# Patient Record
Sex: Male | Born: 1970
Health system: Southern US, Community
[De-identification: ages and names within clinical notes are randomized; demographics above are authoritative.]

## PROBLEM LIST (undated history)

## (undated) DIAGNOSIS — I10 Essential (primary) hypertension: Secondary | ICD-10-CM

## (undated) DIAGNOSIS — E785 Hyperlipidemia, unspecified: Secondary | ICD-10-CM

## (undated) DIAGNOSIS — F1910 Other psychoactive substance abuse, uncomplicated: Secondary | ICD-10-CM

## (undated) DIAGNOSIS — S42301A Unspecified fracture of shaft of humerus, right arm, initial encounter for closed fracture: Secondary | ICD-10-CM

## (undated) DIAGNOSIS — E119 Type 2 diabetes mellitus without complications: Secondary | ICD-10-CM

## (undated) DIAGNOSIS — I509 Heart failure, unspecified: Secondary | ICD-10-CM

## (undated) HISTORY — DX: Hyperlipidemia, unspecified: E78.5

## (undated) HISTORY — PX: ICD IMPLANT: EP1208

## (undated) HISTORY — PX: CARDIAC SURGERY: SHX584

## (undated) HISTORY — PX: CORONARY ANGIOPLASTY: SHX604

## (undated) HISTORY — DX: Unspecified fracture of shaft of humerus, right arm, initial encounter for closed fracture: S42.301A

## (undated) HISTORY — PX: CORONARY ARTERY BYPASS GRAFT: SHX141

---

## 1998-09-20 ENCOUNTER — Emergency Department (HOSPITAL_COMMUNITY): Admission: EM | Admit: 1998-09-20 | Discharge: 1998-09-20 | Payer: Self-pay | Admitting: Emergency Medicine

## 2004-05-24 ENCOUNTER — Emergency Department (HOSPITAL_COMMUNITY): Admission: EM | Admit: 2004-05-24 | Discharge: 2004-05-24 | Payer: Self-pay | Admitting: Emergency Medicine

## 2004-07-10 ENCOUNTER — Emergency Department (HOSPITAL_COMMUNITY): Admission: EM | Admit: 2004-07-10 | Discharge: 2004-07-11 | Payer: Self-pay | Admitting: Emergency Medicine

## 2012-11-27 ENCOUNTER — Emergency Department: Payer: Self-pay | Admitting: Emergency Medicine

## 2012-11-27 LAB — CBC WITH DIFFERENTIAL/PLATELET
Basophil #: 0.1 10*3/uL (ref 0.0–0.1)
Eosinophil #: 0.2 10*3/uL (ref 0.0–0.7)
Eosinophil %: 2.6 %
HGB: 12.9 g/dL — ABNORMAL LOW (ref 13.0–18.0)
Lymphocyte #: 3.3 10*3/uL (ref 1.0–3.6)
MCH: 29.7 pg (ref 26.0–34.0)
MCV: 86 fL (ref 80–100)
Neutrophil #: 4.9 10*3/uL (ref 1.4–6.5)
Platelet: 355 10*3/uL (ref 150–440)
RBC: 4.34 10*6/uL — ABNORMAL LOW (ref 4.40–5.90)
WBC: 9.2 10*3/uL (ref 3.8–10.6)

## 2012-11-27 LAB — BASIC METABOLIC PANEL
Anion Gap: 6 — ABNORMAL LOW (ref 7–16)
Calcium, Total: 9.1 mg/dL (ref 8.5–10.1)
Chloride: 102 mmol/L (ref 98–107)
Co2: 30 mmol/L (ref 21–32)
Creatinine: 0.74 mg/dL (ref 0.60–1.30)
Glucose: 103 mg/dL — ABNORMAL HIGH (ref 65–99)
Osmolality: 274 (ref 275–301)
Potassium: 3.1 mmol/L — ABNORMAL LOW (ref 3.5–5.1)

## 2012-11-27 LAB — URINALYSIS, COMPLETE
Bacteria: NONE SEEN
Glucose,UR: 150 mg/dL (ref 0–75)
Ph: 5 (ref 4.5–8.0)
Protein: >=500

## 2012-12-30 ENCOUNTER — Ambulatory Visit: Payer: Self-pay | Admitting: Family Medicine

## 2012-12-30 LAB — CREATININE, SERUM
EGFR (African American): >60
EGFR (Non-African Amer.): >60

## 2012-12-30 LAB — FOLATE: Folic Acid: 5.5 ng/mL (ref 3.1–100.0)

## 2013-12-04 ENCOUNTER — Emergency Department: Payer: Self-pay | Admitting: Emergency Medicine

## 2013-12-27 ENCOUNTER — Emergency Department: Payer: Self-pay | Admitting: Emergency Medicine

## 2014-03-18 ENCOUNTER — Emergency Department: Payer: Self-pay | Admitting: Emergency Medicine

## 2014-03-18 LAB — CBC
HCT: 37.1 % — ABNORMAL LOW (ref 40.0–52.0)
HGB: 12.2 g/dL — AB (ref 13.0–18.0)
MCH: 30 pg (ref 26.0–34.0)
MCHC: 32.9 g/dL (ref 32.0–36.0)
MCV: 91 fL (ref 80–100)
Platelet: 256 10*3/uL (ref 150–440)
RBC: 4.08 10*6/uL — AB (ref 4.40–5.90)
RDW: 12.8 % (ref 11.5–14.5)
WBC: 7.1 10*3/uL (ref 3.8–10.6)

## 2014-03-18 LAB — BASIC METABOLIC PANEL
Anion Gap: 10 (ref 7–16)
BUN: 19 mg/dL — ABNORMAL HIGH (ref 7–18)
CALCIUM: 8.5 mg/dL (ref 8.5–10.1)
CHLORIDE: 97 mmol/L — AB (ref 98–107)
CREATININE: 1.16 mg/dL (ref 0.60–1.30)
Co2: 24 mmol/L (ref 21–32)
EGFR (Non-African Amer.): >60
Glucose: 565 mg/dL (ref 65–99)
OSMOLALITY: 291 (ref 275–301)
Potassium: 3.5 mmol/L (ref 3.5–5.1)
SODIUM: 131 mmol/L — AB (ref 136–145)

## 2014-03-18 LAB — TROPONIN I

## 2014-05-01 ENCOUNTER — Emergency Department: Payer: Self-pay | Admitting: Emergency Medicine

## 2014-05-01 LAB — COMPREHENSIVE METABOLIC PANEL
Albumin: 3.3 g/dL — ABNORMAL LOW (ref 3.4–5.0)
Alkaline Phosphatase: 146 U/L — ABNORMAL HIGH
Anion Gap: 9 (ref 7–16)
BUN: 15 mg/dL (ref 7–18)
Bilirubin,Total: 0.5 mg/dL (ref 0.2–1.0)
Calcium, Total: 8.6 mg/dL (ref 8.5–10.1)
Chloride: 103 mmol/L (ref 98–107)
Co2: 25 mmol/L (ref 21–32)
Creatinine: 1.05 mg/dL (ref 0.60–1.30)
EGFR (African American): >60
EGFR (Non-African Amer.): >60
Glucose: 523 mg/dL (ref 65–99)
Osmolality: 298 (ref 275–301)
Potassium: 4.2 mmol/L (ref 3.5–5.1)
SGOT(AST): 22 U/L (ref 15–37)
SGPT (ALT): 38 U/L
Sodium: 137 mmol/L (ref 136–145)
Total Protein: 7.4 g/dL (ref 6.4–8.2)

## 2014-05-01 LAB — CBC
HCT: 39.8 % — ABNORMAL LOW (ref 40.0–52.0)
HGB: 13.1 g/dL (ref 13.0–18.0)
MCH: 29.7 pg (ref 26.0–34.0)
MCHC: 32.8 g/dL (ref 32.0–36.0)
MCV: 91 fL (ref 80–100)
Platelet: 361 10*3/uL (ref 150–440)
RBC: 4.39 10*6/uL — ABNORMAL LOW (ref 4.40–5.90)
RDW: 13.1 % (ref 11.5–14.5)
WBC: 8.4 10*3/uL (ref 3.8–10.6)

## 2014-08-27 ENCOUNTER — Emergency Department (HOSPITAL_COMMUNITY): Payer: Self-pay

## 2014-08-27 ENCOUNTER — Emergency Department (HOSPITAL_COMMUNITY)
Admission: EM | Admit: 2014-08-27 | Discharge: 2014-08-27 | Discharge status: Against Medical Advice | Payer: Self-pay | Attending: Emergency Medicine | Admitting: Emergency Medicine

## 2014-08-27 ENCOUNTER — Encounter (HOSPITAL_COMMUNITY): Payer: Self-pay | Admitting: Emergency Medicine

## 2014-08-27 DIAGNOSIS — R7989 Other specified abnormal findings of blood chemistry: Secondary | ICD-10-CM | POA: Insufficient documentation

## 2014-08-27 DIAGNOSIS — T5791XA Toxic effect of unspecified inorganic substance, accidental (unintentional), initial encounter: Secondary | ICD-10-CM

## 2014-08-27 DIAGNOSIS — Z7982 Long term (current) use of aspirin: Secondary | ICD-10-CM | POA: Insufficient documentation

## 2014-08-27 DIAGNOSIS — R739 Hyperglycemia, unspecified: Secondary | ICD-10-CM

## 2014-08-27 DIAGNOSIS — Y9389 Activity, other specified: Secondary | ICD-10-CM | POA: Insufficient documentation

## 2014-08-27 DIAGNOSIS — E1165 Type 2 diabetes mellitus with hyperglycemia: Secondary | ICD-10-CM | POA: Insufficient documentation

## 2014-08-27 DIAGNOSIS — Z794 Long term (current) use of insulin: Secondary | ICD-10-CM | POA: Insufficient documentation

## 2014-08-27 DIAGNOSIS — R51 Headache: Secondary | ICD-10-CM | POA: Insufficient documentation

## 2014-08-27 DIAGNOSIS — R945 Abnormal results of liver function studies: Secondary | ICD-10-CM

## 2014-08-27 DIAGNOSIS — Z79899 Other long term (current) drug therapy: Secondary | ICD-10-CM | POA: Insufficient documentation

## 2014-08-27 DIAGNOSIS — Z720 Tobacco use: Secondary | ICD-10-CM | POA: Insufficient documentation

## 2014-08-27 DIAGNOSIS — Y998 Other external cause status: Secondary | ICD-10-CM | POA: Insufficient documentation

## 2014-08-27 DIAGNOSIS — F419 Anxiety disorder, unspecified: Secondary | ICD-10-CM | POA: Insufficient documentation

## 2014-08-27 DIAGNOSIS — Y929 Unspecified place or not applicable: Secondary | ICD-10-CM | POA: Insufficient documentation

## 2014-08-27 DIAGNOSIS — T50994A Poisoning by other drugs, medicaments and biological substances, undetermined, initial encounter: Secondary | ICD-10-CM | POA: Insufficient documentation

## 2014-08-27 DIAGNOSIS — R109 Unspecified abdominal pain: Secondary | ICD-10-CM | POA: Insufficient documentation

## 2014-08-27 HISTORY — DX: Type 2 diabetes mellitus without complications: E11.9

## 2014-08-27 LAB — HEPATIC FUNCTION PANEL
ALT: 419 U/L — ABNORMAL HIGH (ref 17–63)
AST: 82 U/L — ABNORMAL HIGH (ref 15–41)
Albumin: 3.9 g/dL (ref 3.5–5.0)
Alkaline Phosphatase: 310 U/L — ABNORMAL HIGH (ref 38–126)
Bilirubin, Direct: 0.4 mg/dL (ref 0.1–0.5)
Indirect Bilirubin: 0.2 mg/dL — ABNORMAL LOW (ref 0.3–0.9)
Total Bilirubin: 0.6 mg/dL (ref 0.3–1.2)
Total Protein: 7.6 g/dL (ref 6.5–8.1)

## 2014-08-27 LAB — URINALYSIS, ROUTINE W REFLEX MICROSCOPIC
Bilirubin Urine: NEGATIVE
Glucose, UA: >1000 mg/dL — AB
Ketones, ur: NEGATIVE mg/dL
Leukocytes, UA: NEGATIVE
Nitrite: NEGATIVE
Protein, ur: NEGATIVE mg/dL
Specific Gravity, Urine: 1.027 (ref 1.005–1.030)
Urobilinogen, UA: 1 mg/dL (ref 0.0–1.0)
pH: 6.5 (ref 5.0–8.0)

## 2014-08-27 LAB — CBG MONITORING, ED: Glucose-Capillary: 452 mg/dL — ABNORMAL HIGH (ref 65–99)

## 2014-08-27 LAB — URINE MICROSCOPIC-ADD ON

## 2014-08-27 LAB — CBC WITH DIFFERENTIAL/PLATELET
Basophils Absolute: 0 10*3/uL (ref 0.0–0.1)
Basophils Relative: 1 % (ref 0–1)
Eosinophils Absolute: 0.1 10*3/uL (ref 0.0–0.7)
Eosinophils Relative: 2 % (ref 0–5)
HCT: 39.1 % (ref 39.0–52.0)
Hemoglobin: 12.4 g/dL — ABNORMAL LOW (ref 13.0–17.0)
Lymphocytes Relative: 45 % (ref 12–46)
Lymphs Abs: 3 10*3/uL (ref 0.7–4.0)
MCH: 28.4 pg (ref 26.0–34.0)
MCHC: 31.7 g/dL (ref 30.0–36.0)
MCV: 89.5 fL (ref 78.0–100.0)
Monocytes Absolute: 0.8 10*3/uL (ref 0.1–1.0)
Monocytes Relative: 12 % (ref 3–12)
Neutro Abs: 2.6 10*3/uL (ref 1.7–7.7)
Neutrophils Relative %: 40 % — ABNORMAL LOW (ref 43–77)
Platelets: 357 10*3/uL (ref 150–400)
RBC: 4.37 MIL/uL (ref 4.22–5.81)
RDW: 13.9 % (ref 11.5–15.5)
WBC: 6.5 10*3/uL (ref 4.0–10.5)

## 2014-08-27 LAB — COMPREHENSIVE METABOLIC PANEL
ALT: 428 U/L — ABNORMAL HIGH (ref 17–63)
AST: 84 U/L — ABNORMAL HIGH (ref 15–41)
Albumin: 3.9 g/dL (ref 3.5–5.0)
Alkaline Phosphatase: 319 U/L — ABNORMAL HIGH (ref 38–126)
Anion gap: 9 (ref 5–15)
BUN: 11 mg/dL (ref 6–20)
CO2: 27 mmol/L (ref 22–32)
Calcium: 9.2 mg/dL (ref 8.9–10.3)
Chloride: 96 mmol/L — ABNORMAL LOW (ref 101–111)
Creatinine, Ser: 1.04 mg/dL (ref 0.61–1.24)
GFR calc Af Amer: >60 mL/min (ref >60)
GFR calc non Af Amer: >60 mL/min (ref >60)
Glucose, Bld: 663 mg/dL (ref 65–99)
Potassium: 4.3 mmol/L (ref 3.5–5.1)
Sodium: 132 mmol/L — ABNORMAL LOW (ref 135–145)
Total Bilirubin: 1.1 mg/dL (ref 0.3–1.2)
Total Protein: 7.8 g/dL (ref 6.5–8.1)

## 2014-08-27 LAB — ETHANOL: Alcohol, Ethyl (B): <5 mg/dL (ref <5)

## 2014-08-27 LAB — LIPASE, BLOOD: Lipase: 18 U/L — ABNORMAL LOW (ref 22–51)

## 2014-08-27 LAB — ACETAMINOPHEN LEVEL: Acetaminophen (Tylenol), Serum: <10 ug/mL — ABNORMAL LOW (ref 10–30)

## 2014-08-27 MED ORDER — LORAZEPAM 2 MG/ML IJ SOLN
1.0000 mg | Freq: Once | INTRAMUSCULAR | Status: AC
  Administered 2014-08-27: 1 mg via INTRAVENOUS
  Filled 2014-08-27: qty 1

## 2014-08-27 MED ORDER — ONDANSETRON HCL 4 MG/2ML IJ SOLN
4.0000 mg | Freq: Once | INTRAMUSCULAR | Status: AC
  Administered 2014-08-27: 4 mg via INTRAVENOUS
  Filled 2014-08-27: qty 2

## 2014-08-27 MED ORDER — SODIUM CHLORIDE 0.9 % IV BOLUS (SEPSIS)
1000.0000 mL | Freq: Once | INTRAVENOUS | Status: AC
  Administered 2014-08-27: 1000 mL via INTRAVENOUS

## 2014-08-27 MED ORDER — INSULIN ASPART 100 UNIT/ML ~~LOC~~ SOLN
10.0000 [IU] | Freq: Once | SUBCUTANEOUS | Status: AC
  Administered 2014-08-27: 10 [IU] via SUBCUTANEOUS
  Filled 2014-08-27: qty 1

## 2014-08-27 NOTE — ED Notes (Signed)
Pt c/o emesis, lower abdominal pain, and headache onset today. Denies diarrhea.

## 2014-08-27 NOTE — ED Notes (Signed)
Patient transported to X-ray 

## 2014-08-27 NOTE — ED Provider Notes (Signed)
CSN: [id number]     Arrival date & time 08/27/14  1722 History   First MD Initiated Contact with Patient 08/27/14 2005     Chief Complaint  Patient presents with  . Abdominal Pain  . Nausea  . Headache     (Consider location/radiation/quality/duration/timing/severity/associated sxs/prior Treatment) The history is provided by the patient.  Jesse Christian is a 44 y.o. male history diabetes here presenting with possible ingestion. 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 States that he use a combination of lithium oil, remington Rem oil, sowing machine oil. Patient usually makes his own sweet tea. He states that he makes a sugar-free sweet tea and drinks it all day. Around 5 PM, he drank the last part of his teeth and then noticed that it doesn't taste right so vomited once. Has some cough afterwards. He doesn't know if some of the things that he used actually got into his tea. The denies any alcohol or drug use. Of note patient does have multiple tattoos and does " cut himself" since it feels good. Denies a suicidal homicidal ideations. States that he has headaches since then and felt anxious. Didn't use his insulin today.   Past Medical History  Diagnosis Date  . Diabetes mellitus without complication    History reviewed. No pertinent past surgical history. History reviewed. No pertinent family history. History  Substance Use Topics  . Smoking status: Current Every Day Smoker -- 0.20 packs/day    Types: Cigarettes  . Smokeless tobacco: Not on file  . Alcohol Use: No    Review of Systems  Gastrointestinal: Positive for abdominal pain.  Neurological: Positive for headaches.  All other systems reviewed and are negative.     Allergies  Review of patient's allergies indicates no known allergies.  Home Medications   Prior to Admission medications   Medication Sig Start Date End Date Taking? Authorizing Provider  ALPRAZolam Prudy Feeler(XANAX) 1 MG tablet Take 1 mg by  mouth at bedtime as needed for anxiety (anxiety).   Yes Historical Provider, MD  aspirin 81 MG tablet Take 162 mg by mouth daily.   Yes Historical Provider, MD  atorvastatin (LIPITOR) 40 MG tablet Take 40 mg by mouth daily.   Yes Historical Provider, MD  Chlordiazepoxide-Clidinium (LIBRAX PO) Take 1 tablet by mouth daily as needed (anxiety).   Yes Historical Provider, MD  esomeprazole (NEXIUM) 40 MG capsule Take 40 mg by mouth daily at 12 noon.   Yes Historical Provider, MD  insulin detemir (LEVEMIR) 100 UNIT/ML injection Inject 30 Units into the skin 2 (two) times daily.   Yes Historical Provider, MD  insulin lispro (HUMALOG) 100 UNIT/ML injection Inject 5-15 Units into the skin 3 (three) times daily.   Yes Historical Provider, MD  Multiple Vitamins-Minerals (MULTIVITAMIN & MINERAL PO) Take 1 tablet by mouth daily.   Yes Historical Provider, MD  Omega-3 Fatty Acids (FISH OIL) 1000 MG CAPS Take 1 capsule by mouth daily.   Yes Historical Provider, MD   BP 170/91 mmHg  Pulse 95  Temp(Src) 97.6 F (36.4 C) (Oral)  Resp 18  SpO2 99% Physical Exam  Constitutional: He is oriented to person, place, and time.  Anxious   HENT:  Head: Normocephalic.  MM slightly dry   Eyes: Conjunctivae are normal. Pupils are equal, round, and reactive to light.  Neck: Normal range of motion. Neck supple.  Cardiovascular: Normal rate, regular rhythm and normal heart sounds.   Pulmonary/Chest: Effort normal and breath sounds  normal. No respiratory distress. He has no wheezes. He has no rales.  Abdominal: Soft. Bowel sounds are normal. He exhibits no distension. There is no tenderness. There is no rebound.  No RUQ tenderness.   Musculoskeletal: Normal range of motion. He exhibits no edema or tenderness.  Neurological: He is alert and oriented to person, place, and time. No cranial nerve deficit. Coordination normal.  Skin: Skin is warm and dry.  Psychiatric: He has a normal mood and affect. His behavior is normal.  Judgment and thought content normal.  Nursing note and vitals reviewed.   ED Course  Procedures (including critical care time) Labs Review Labs Reviewed  CBC WITH DIFFERENTIAL/PLATELET - Abnormal; Notable for the following:    Hemoglobin 12.4 (*)    Neutrophils Relative % 40 (*)    All other components within normal limits  COMPREHENSIVE METABOLIC PANEL - Abnormal; Notable for the following:    Sodium 132 (*)    Chloride 96 (*)    Glucose, Bld 663 (*)    AST 84 (*)    ALT 428 (*)    Alkaline Phosphatase 319 (*)    All other components within normal limits  LIPASE, BLOOD - Abnormal; Notable for the following:    Lipase 18 (*)    All other components within normal limits  URINALYSIS, ROUTINE W REFLEX MICROSCOPIC - Abnormal; Notable for the following:    Glucose, UA >1000 (*)    Hgb urine dipstick TRACE (*)    All other components within normal limits  ACETAMINOPHEN LEVEL - Abnormal; Notable for the following:    Acetaminophen (Tylenol), Serum <10 (*)    All other components within normal limits  HEPATIC FUNCTION PANEL - Abnormal; Notable for the following:    AST 82 (*)    ALT 419 (*)    Alkaline Phosphatase 310 (*)    Indirect Bilirubin 0.2 (*)    All other components within normal limits  CBG MONITORING, ED - Abnormal; Notable for the following:    Glucose-Capillary 452 (*)    All other components within normal limits  ETHANOL  URINE MICROSCOPIC-ADD ON  HEPATITIS PANEL, ACUTE    Imaging Review Dg Chest 2 View  08/27/2014   CLINICAL DATA:  Abdominal pain, nausea and cough.  EXAM: CHEST  2 VIEW  COMPARISON:  05/01/2014  FINDINGS: The cardiomediastinal contours are normal. The lungs are clear. Pulmonary vasculature is normal. No consolidation, pleural effusion, or pneumothorax. No acute osseous abnormalities are seen.  IMPRESSION: No acute pulmonary process.   Electronically Signed   By: Rubye OaksMelanie  Ehinger M.D.   On: 08/27/2014 21:41     EKG Interpretation None       MDM   Final diagnoses:  None    Jesse Christian is a 44 y.o. male here with possible overdose. LFTs elevated but abdomen nontender. Denies tylenol or alcohol use. Consulted poison control, who recommend repeat LFT and if elevated will need to start mucomyst empirically.   9:48 PM Repeat LFTs still elevated. Hepatitis panel sent. CBG dec to 452. Still drinking mountain dew at bedside. I told him that we should start mucomyst per poison control recommendation but he states that he has to go to work. Will AMA patient. Understand risk of worsening LFT causing liver failure and possible disability and death.       Richardean Canalavid H Yao, MD 08/27/14 2149

## 2014-08-27 NOTE — ED Notes (Signed)
Glucose 663 mg/dl Critical called by Ivan CroftSarah Vanhorn Triage RN notified

## 2014-08-27 NOTE — ED Notes (Signed)
Pt is in room drinking mountain dew and ginger ale that wife brought from home. Pt instructed that can make his blood sugar worse and he should not drink it but pt still drinking sodas.

## 2014-08-27 NOTE — ED Notes (Signed)
Pt states he makes his own special tea in a big cup, states he went to drink the last sip around 5 pm this evening and it tasted like acid was in it, pt states he spit it out but then not long after vomited x 1, pt states he is now anxious about this and needs something for anxiety and a headache, states he did not take anything at home for the headache, pt also states he's been so anxious he didn't take his insulin. Pt has scratches all over body, when asked what they were from he stated his line of work.

## 2014-08-27 NOTE — Discharge Instructions (Signed)
You signed out against medical advice.  Your blood sugar is elevated. Take your insulin as directed.   Return to ER to get repeat liver function tests.

## 2014-10-06 ENCOUNTER — Encounter (HOSPITAL_COMMUNITY): Payer: Self-pay | Admitting: *Deleted

## 2014-10-06 ENCOUNTER — Emergency Department (HOSPITAL_COMMUNITY): Payer: Self-pay

## 2014-10-06 ENCOUNTER — Emergency Department (HOSPITAL_COMMUNITY)
Admission: EM | Admit: 2014-10-06 | Discharge: 2014-10-06 | Discharge status: Against Medical Advice | Payer: Self-pay | Attending: Emergency Medicine | Admitting: Emergency Medicine

## 2014-10-06 DIAGNOSIS — Z79899 Other long term (current) drug therapy: Secondary | ICD-10-CM | POA: Insufficient documentation

## 2014-10-06 DIAGNOSIS — R0602 Shortness of breath: Secondary | ICD-10-CM | POA: Insufficient documentation

## 2014-10-06 DIAGNOSIS — E1065 Type 1 diabetes mellitus with hyperglycemia: Secondary | ICD-10-CM | POA: Insufficient documentation

## 2014-10-06 DIAGNOSIS — Z794 Long term (current) use of insulin: Secondary | ICD-10-CM | POA: Insufficient documentation

## 2014-10-06 DIAGNOSIS — Z720 Tobacco use: Secondary | ICD-10-CM | POA: Insufficient documentation

## 2014-10-06 LAB — CBC
HCT: 41.5 % (ref 39.0–52.0)
Hemoglobin: 13.5 g/dL (ref 13.0–17.0)
MCH: 29 pg (ref 26.0–34.0)
MCHC: 32.5 g/dL (ref 30.0–36.0)
MCV: 89.1 fL (ref 78.0–100.0)
Platelets: 258 10*3/uL (ref 150–400)
RBC: 4.66 MIL/uL (ref 4.22–5.81)
RDW: 14.1 % (ref 11.5–15.5)
WBC: 9.2 10*3/uL (ref 4.0–10.5)

## 2014-10-06 LAB — COMPREHENSIVE METABOLIC PANEL
ALT: 306 U/L — ABNORMAL HIGH (ref 17–63)
AST: 222 U/L — ABNORMAL HIGH (ref 15–41)
Albumin: 3.7 g/dL (ref 3.5–5.0)
Alkaline Phosphatase: 139 U/L — ABNORMAL HIGH (ref 38–126)
Anion gap: 10 (ref 5–15)
BUN: 15 mg/dL (ref 6–20)
CO2: 21 mmol/L — ABNORMAL LOW (ref 22–32)
Calcium: 8.9 mg/dL (ref 8.9–10.3)
Chloride: 99 mmol/L — ABNORMAL LOW (ref 101–111)
Creatinine, Ser: 1.02 mg/dL (ref 0.61–1.24)
GFR calc Af Amer: >60 mL/min (ref >60)
GFR calc non Af Amer: >60 mL/min (ref >60)
Glucose, Bld: 480 mg/dL — ABNORMAL HIGH (ref 65–99)
Potassium: 3.8 mmol/L (ref 3.5–5.1)
Sodium: 130 mmol/L — ABNORMAL LOW (ref 135–145)
Total Bilirubin: 1.5 mg/dL — ABNORMAL HIGH (ref 0.3–1.2)
Total Protein: 7.1 g/dL (ref 6.5–8.1)

## 2014-10-06 LAB — URINALYSIS, ROUTINE W REFLEX MICROSCOPIC
Bilirubin Urine: NEGATIVE
Glucose, UA: >1000 mg/dL — AB
Ketones, ur: NEGATIVE mg/dL
Leukocytes, UA: NEGATIVE
Nitrite: NEGATIVE
Protein, ur: 30 mg/dL — AB
Specific Gravity, Urine: 1.036 — ABNORMAL HIGH (ref 1.005–1.030)
Urobilinogen, UA: 0.2 mg/dL (ref 0.0–1.0)
pH: 5 (ref 5.0–8.0)

## 2014-10-06 LAB — CBG MONITORING, ED
Glucose-Capillary: 383 mg/dL — ABNORMAL HIGH (ref 65–99)
Glucose-Capillary: 372 mg/dL — ABNORMAL HIGH (ref 65–99)

## 2014-10-06 LAB — URINE MICROSCOPIC-ADD ON

## 2014-10-06 MED ORDER — INSULIN DETEMIR 100 UNIT/ML ~~LOC~~ SOLN
30.0000 [IU] | Freq: Once | SUBCUTANEOUS | Status: AC
  Administered 2014-10-06: 30 [IU] via SUBCUTANEOUS
  Filled 2014-10-06: qty 0.3

## 2014-10-06 MED ORDER — SODIUM CHLORIDE 0.9 % IV SOLN
1000.0000 mL | INTRAVENOUS | Status: DC
  Administered 2014-10-06: 1000 mL via INTRAVENOUS

## 2014-10-06 MED ORDER — INSULIN ASPART 100 UNIT/ML ~~LOC~~ SOLN
5.0000 [IU] | Freq: Once | SUBCUTANEOUS | Status: AC
  Administered 2014-10-06: 5 [IU] via SUBCUTANEOUS
  Filled 2014-10-06: qty 1

## 2014-10-06 MED ORDER — INSULIN ASPART 100 UNIT/ML ~~LOC~~ SOLN
8.0000 [IU] | Freq: Once | SUBCUTANEOUS | Status: AC
  Administered 2014-10-06: 8 [IU] via SUBCUTANEOUS
  Filled 2014-10-06: qty 1

## 2014-10-06 MED ORDER — SODIUM CHLORIDE 0.9 % IV SOLN
1000.0000 mL | Freq: Once | INTRAVENOUS | Status: AC
  Administered 2014-10-06: 1000 mL via INTRAVENOUS

## 2014-10-06 MED ORDER — SODIUM CHLORIDE 0.9 % IV BOLUS (SEPSIS): 1000.0000 mL | Freq: Once | INTRAVENOUS | Status: DC

## 2014-10-06 NOTE — ED Notes (Addendum)
Called patient from waiting room with no answer. Will call back again in a few minutes

## 2014-10-06 NOTE — ED Notes (Signed)
Pt complains of weakness, feeling faint since yesterday. Pt states he has also had difficulty breathing for the past 2 weeks. Pt is insulin dependent diabetic, his sugar was 642 last night, 403 this morning. Pt sugar was 403, 642 last night.

## 2014-10-06 NOTE — ED Provider Notes (Signed)
CSN: [id number]     Arrival date & time 10/06/14  1527 History   First MD Initiated Contact with Patient 10/06/14 1659     Chief Complaint  Patient presents with  . Hyperglycemia  . Shortness of Breath     (Consider location/radiation/quality/duration/timing/severity/associated sxs/prior Treatment) HPI Patient reports increasing difficulty controlling his blood sugar despite compliance with medications. He does report increasing fatigue and lightheadedness. He has increasing urine output. Yesterday he reports blood sugar was up to 642. This morning he reports it was at 403. He has not had confusion or fever. He has not had wounds develop on his extremities. No areas of redness or rash. Past Medical History  Diagnosis Date  . Diabetes mellitus without complication    History reviewed. No pertinent past surgical history. No family history on file. History  Substance Use Topics  . Smoking status: Current Every Day Smoker -- 0.20 packs/day    Types: Cigarettes  . Smokeless tobacco: Not on file  . Alcohol Use: No    Review of Systems 10 Systems reviewed and are negative for acute change except as noted in the HPI.    Allergies  Review of patient's allergies indicates no known allergies.  Home Medications   Prior to Admission medications   Medication Sig Start Date End Date Taking? Authorizing Provider  ALPRAZolam Prudy Feeler(XANAX) 1 MG tablet Take 1 mg by mouth at bedtime as needed for anxiety (anxiety).   Yes Historical Provider, MD  aspirin 81 MG tablet Take 162 mg by mouth daily.   Yes Historical Provider, MD  atorvastatin (LIPITOR) 40 MG tablet Take 40 mg by mouth daily.   Yes Historical Provider, MD  Chlordiazepoxide-Clidinium (LIBRAX PO) Take 1 tablet by mouth daily as needed (anxiety).   Yes Historical Provider, MD  insulin detemir (LEVEMIR) 100 UNIT/ML injection Inject 30 Units into the skin 2 (two) times daily.   Yes Historical Provider, MD  insulin lispro (HUMALOG) 100 UNIT/ML  injection Inject 5-15 Units into the skin 3 (three) times daily. Sliding scale   Yes Historical Provider, MD  Multiple Vitamins-Minerals (MULTIVITAMIN & MINERAL PO) Take 1 tablet by mouth daily.   Yes Historical Provider, MD  Omega-3 Fatty Acids (FISH OIL) 1000 MG CAPS Take 1 capsule by mouth daily.   Yes Historical Provider, MD   BP 151/106 mmHg  Pulse 95  Temp(Src) 98.8 F (37.1 C) (Oral)  Resp 12  SpO2 94% Physical Exam  Constitutional: He is oriented to person, place, and time. He appears well-developed and well-nourished.  HENT:  Head: Normocephalic and atraumatic.  Eyes: EOM are normal. Pupils are equal, round, and reactive to light.  Neck: Neck supple.  Cardiovascular: Normal rate, regular rhythm, normal heart sounds and intact distal pulses.   Pulmonary/Chest: Effort normal and breath sounds normal.  Abdominal: Soft. Bowel sounds are normal. He exhibits no distension. There is no tenderness.  Musculoskeletal: Normal range of motion. He exhibits no edema.  Feet and lower extremities are free of wounds or cellulitis.  Neurological: He is alert and oriented to person, place, and time. He has normal strength. Coordination normal. GCS eye subscore is 4. GCS verbal subscore is 5. GCS motor subscore is 6.  Mental status is clear, speech is normal.  Skin: Skin is warm, dry and intact.  Psychiatric: He has a normal mood and affect.    ED Course  Procedures (including critical care time) Labs Review Labs Reviewed  COMPREHENSIVE METABOLIC PANEL - Abnormal; Notable for the following:  Sodium 130 (*)    Chloride 99 (*)    CO2 21 (*)    Glucose, Bld 480 (*)    AST 222 (*)    ALT 306 (*)    Alkaline Phosphatase 139 (*)    Total Bilirubin 1.5 (*)    All other components within normal limits  URINALYSIS, ROUTINE W REFLEX MICROSCOPIC (NOT AT [facility]) - Abnormal; Notable for the following:    Color, Urine AMBER (*)    Specific Gravity, Urine 1.036 (*)    Glucose, UA >1000 (*)    Hgb  urine dipstick MODERATE (*)    Protein, ur 30 (*)    All other components within normal limits  CBG MONITORING, ED - Abnormal; Notable for the following:    Glucose-Capillary 383 (*)    All other components within normal limits  CBG MONITORING, ED - Abnormal; Notable for the following:    Glucose-Capillary 372 (*)    All other components within normal limits  CBC  URINE MICROSCOPIC-ADD ON    Imaging Review No results found.   EKG Interpretation   Date/Time:  Tuesday October 06 2014 16:03:27 EDT Ventricular Rate:  116 PR Interval:  152 QRS Duration: 95 QT Interval:  344 QTC Calculation: 478 R Axis:   75 Text Interpretation:  Sinus tachycardia Biatrial enlargement Minimal ST  depression, diffuse leads Borderline prolonged QT interval Baseline wander  in lead(s) V4 Confirmed by Donnald Garre, MD, Lebron Conners [id number]) on 10/06/2014  6:33:35 PM      MDM   Final diagnoses:  Hyperglycemia due to type 1 diabetes mellitus   Patient presented with chief complaints of hyperglycemia and difficulty managing blood sugar at home despite insulin therapy. The patient does not acutely show signs of infectious illness or anion gap acidosis. Treatment was initiated with insulin administration and fluids. Prior to completing treatment and disposition the patient determined that he was going to leave AGAINST MEDICAL ADVICE. At this time the patient does not have any cognitive impairment or decision making capacity impairment.    Arby Barrette, MD 10/10/14 [id number]

## 2014-10-06 NOTE — ED Notes (Addendum)
Pt requesting to leave. Pt made aware of importance of IV fluids to bring down blood sugar.

## 2014-10-07 NOTE — ED Notes (Signed)
Chart opened to complete charting.

## 2014-10-07 NOTE — ED Notes (Signed)
Pt leaving AMA. This RN has explained to the pt that his treatment is not concluded. He understands his blood sugar is still elevated, which could be a serious detriment to his health. Despite verbalizing understanding of this information, he refuses further treatment and refuses to wait for his infusions to continue. Pt removed his IV and asked to sign himself out. Pt was calm and cooperative.

## 2014-11-23 ENCOUNTER — Encounter: Payer: Self-pay | Admitting: *Deleted

## 2014-11-23 ENCOUNTER — Emergency Department: Payer: Self-pay

## 2014-11-23 ENCOUNTER — Emergency Department
Admission: EM | Admit: 2014-11-23 | Discharge: 2014-11-23 | Disposition: A | Discharge status: Home / Self Care | Payer: Self-pay | Attending: Emergency Medicine | Admitting: Emergency Medicine

## 2014-11-23 DIAGNOSIS — Y9289 Other specified places as the place of occurrence of the external cause: Secondary | ICD-10-CM | POA: Insufficient documentation

## 2014-11-23 DIAGNOSIS — Y9389 Activity, other specified: Secondary | ICD-10-CM | POA: Insufficient documentation

## 2014-11-23 DIAGNOSIS — Z7982 Long term (current) use of aspirin: Secondary | ICD-10-CM | POA: Insufficient documentation

## 2014-11-23 DIAGNOSIS — Z720 Tobacco use: Secondary | ICD-10-CM | POA: Insufficient documentation

## 2014-11-23 DIAGNOSIS — Z79899 Other long term (current) drug therapy: Secondary | ICD-10-CM | POA: Insufficient documentation

## 2014-11-23 DIAGNOSIS — E119 Type 2 diabetes mellitus without complications: Secondary | ICD-10-CM | POA: Insufficient documentation

## 2014-11-23 DIAGNOSIS — Y998 Other external cause status: Secondary | ICD-10-CM | POA: Insufficient documentation

## 2014-11-23 DIAGNOSIS — Z189 Retained foreign body fragments, unspecified material: Secondary | ICD-10-CM

## 2014-11-23 DIAGNOSIS — W34010A Accidental discharge of airgun, initial encounter: Secondary | ICD-10-CM | POA: Insufficient documentation

## 2014-11-23 DIAGNOSIS — Z79811 Long term (current) use of aromatase inhibitors: Secondary | ICD-10-CM | POA: Insufficient documentation

## 2014-11-23 DIAGNOSIS — S61442A Puncture wound with foreign body of left hand, initial encounter: Secondary | ICD-10-CM | POA: Insufficient documentation

## 2014-11-23 DIAGNOSIS — Z23 Encounter for immunization: Secondary | ICD-10-CM | POA: Insufficient documentation

## 2014-11-23 MED ORDER — TRAMADOL HCL 50 MG PO TABS: 50.0000 mg | ORAL_TABLET | Freq: Two times a day (BID) | ORAL | Status: DC

## 2014-11-23 MED ORDER — LIDOCAINE-EPINEPHRINE (PF) 1 %-1:200000 IJ SOLN
30.0000 mL | Freq: Once | INTRAMUSCULAR | Status: AC
  Administered 2014-11-23: 30 mL via INTRADERMAL
  Filled 2014-11-23: qty 30

## 2014-11-23 MED ORDER — AMOXICILLIN-POT CLAVULANATE 875-125 MG PO TABS: 1.0000 | ORAL_TABLET | Freq: Two times a day (BID) | ORAL | Status: DC

## 2014-11-23 MED ORDER — TETANUS-DIPHTH-ACELL PERTUSSIS 5-2.5-18.5 LF-MCG/0.5 IM SUSP
0.5000 mL | Freq: Once | INTRAMUSCULAR | Status: AC
  Administered 2014-11-23: 0.5 mL via INTRAMUSCULAR
  Filled 2014-11-23: qty 0.5

## 2014-11-23 MED ORDER — BACITRACIN-NEOMYCIN-POLYMYXIN 400-5-5000 EX OINT
TOPICAL_OINTMENT | Freq: Once | CUTANEOUS | Status: AC
  Administered 2014-11-23: 1 via TOPICAL
  Filled 2014-11-23: qty 1
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
## 2015-05-26 LAB — HM DIABETES EYE EXAM

## 2015-06-08 ENCOUNTER — Ambulatory Visit (INDEPENDENT_AMBULATORY_CARE_PROVIDER_SITE_OTHER): Payer: BLUE CROSS/BLUE SHIELD | Admitting: Family Medicine

## 2015-06-08 ENCOUNTER — Encounter: Payer: Self-pay | Admitting: Family Medicine

## 2015-06-08 VITALS — BP 128/72 | HR 92 | Temp 97.3°F | Ht 70.3 in | Wt 145.0 lb

## 2015-06-08 DIAGNOSIS — I129 Hypertensive chronic kidney disease with stage 1 through stage 4 chronic kidney disease, or unspecified chronic kidney disease: Secondary | ICD-10-CM | POA: Diagnosis not present

## 2015-06-08 DIAGNOSIS — E785 Hyperlipidemia, unspecified: Secondary | ICD-10-CM

## 2015-06-08 DIAGNOSIS — F1911 Other psychoactive substance abuse, in remission: Secondary | ICD-10-CM | POA: Insufficient documentation

## 2015-06-08 DIAGNOSIS — E119 Type 2 diabetes mellitus without complications: Secondary | ICD-10-CM | POA: Diagnosis not present

## 2015-06-08 DIAGNOSIS — R319 Hematuria, unspecified: Secondary | ICD-10-CM | POA: Diagnosis not present

## 2015-06-08 DIAGNOSIS — I10 Essential (primary) hypertension: Secondary | ICD-10-CM

## 2015-06-08 DIAGNOSIS — Z23 Encounter for immunization: Secondary | ICD-10-CM

## 2015-06-08 DIAGNOSIS — Z794 Long term (current) use of insulin: Secondary | ICD-10-CM

## 2015-06-08 DIAGNOSIS — E1165 Type 2 diabetes mellitus with hyperglycemia: Secondary | ICD-10-CM

## 2015-06-08 DIAGNOSIS — F1910 Other psychoactive substance abuse, uncomplicated: Secondary | ICD-10-CM

## 2015-06-08 LAB — UA/M W/RFLX CULTURE, ROUTINE
Bilirubin, UA: NEGATIVE
KETONES UA: NEGATIVE
LEUKOCYTES UA: NEGATIVE
NITRITE UA: NEGATIVE
SPEC GRAV UA: 1.02 (ref 1.005–1.030)
Urobilinogen, Ur: 1 mg/dL (ref 0.2–1.0)
pH, UA: 5.5 (ref 5.0–7.5)

## 2015-06-08 LAB — CBC WITH DIFFERENTIAL/PLATELET
HEMATOCRIT: 33.8 % — AB (ref 37.5–51.0)
Hemoglobin: 11.8 g/dL — ABNORMAL LOW (ref 12.6–17.7)
LYMPHS ABS: 2.7 10*3/uL (ref 0.7–3.1)
Lymphs: 38 %
MCH: 31.2 pg (ref 26.6–33.0)
MCHC: 34.9 g/dL (ref 31.5–35.7)
MCV: 89 fL (ref 79–97)
MID (ABSOLUTE): 1.2 10*3/uL (ref 0.1–1.6)
MID: 17 %
Neutrophils Absolute: 3.2 10*3/uL (ref 1.4–7.0)
Neutrophils: 45 %
PLATELETS: 235 10*3/uL (ref 150–379)
RBC: 3.78 x10E6/uL — AB (ref 4.14–5.80)
RDW: 12.8 % (ref 12.3–15.4)
WBC: 7.1 10*3/uL (ref 3.4–10.8)

## 2015-06-08 LAB — MICROALBUMIN, URINE WAIVED
CREATININE, URINE WAIVED: 100 mg/dL (ref 10–300)
Microalb, Ur Waived: 150 mg/L — ABNORMAL HIGH (ref 0–19)

## 2015-06-08 LAB — BAYER DCA HB A1C WAIVED

## 2015-06-08 LAB — MICROSCOPIC EXAMINATION
EPITHELIAL CELLS (NON RENAL): NONE SEEN /HPF (ref 0–10)
WBC UA: NONE SEEN /HPF (ref 0–?)

## 2015-06-08 MED ORDER — CANAGLIFLOZIN 300 MG PO TABS: 300.0000 mg | ORAL_TABLET | Freq: Every day | ORAL | Status: DC

## 2015-06-08 MED ORDER — INSULIN DETEMIR 100 UNIT/ML ~~LOC~~ SOLN: 30.0000 [IU] | Freq: Two times a day (BID) | SUBCUTANEOUS | Status: DC

## 2015-06-08 MED ORDER — METFORMIN HCL ER (MOD) 500 MG PO TB24: 1000.0000 mg | ORAL_TABLET | Freq: Two times a day (BID) | ORAL | Status: DC

## 2015-06-08 MED ORDER — LISINOPRIL 10 MG PO TABS: 10.0000 mg | ORAL_TABLET | Freq: Every day | ORAL | Status: DC

## 2015-06-08 MED ORDER — ATORVASTATIN CALCIUM 40 MG PO TABS: 40.0000 mg | ORAL_TABLET | Freq: Every day | ORAL | Status: DC

## 2015-06-08 MED ORDER — INSULIN LISPRO 100 UNIT/ML ~~LOC~~ SOLN: 5.0000 [IU] | Freq: Three times a day (TID) | SUBCUTANEOUS | Status: DC

## 2015-06-08 NOTE — Progress Notes (Signed)
BP 128/72 mmHg  Pulse 92  Temp(Src) 97.3 F (36.3 C)  Ht 5' 10.3" (1.786 m)  Wt 145 lb (65.772 kg)  BMI 20.62 kg/m2  SpO2 99%   Subjective:    Patient ID: Jesse Christian, male    DOB: 1970/10/25, 45 y.o.   MRN: [id number]  HPI: Jesse Christian is a 45 y.o. male who presents today to establish care  Chief Complaint  Patient presents with  . Establish Care  . Hypertension  . Hyperlipidemia  . Diabetes   Has been dizzy when he's at work for the first couple of hours. Not sure what this is.   Goes to methadone clinic- has been clean for over a year.   DIABETES- in his 88s when he was diagnosed Hypoglycemic episodes:no Polydipsia/polyuria: yes Visual disturbance: no Chest pain: no Paresthesias: yes, his feet Glucose Monitoring: yes  Accucheck frequency: BID  Fasting glucose: 300s Taking Insulin?: yes  Long acting insulin: 30 units BID Blood Pressure Monitoring: not checking Retinal Examination: Up to Date Foot Exam: Up to Date Diabetic Education: Not Completed Pneumovax: Up to Date Influenza: Up to Date Aspirin: yes  HYPERTENSION / HYPERLIPIDEMIA Satisfied with current treatment? yes Duration of hypertension: was on it for kidney protection BP monitoring frequency: not checking BP medication side effects: no Past BP meds: lisinopril Duration of hyperlipidemia: about 5 years Cholesterol medication side effects: no Cholesterol supplements: fish oil Past cholesterol medications: atorvastatin Medication compliance: fair compliance Aspirin: yes Recent stressors: yes Recurrent headaches: no Visual changes: yes Palpitations: no Dyspnea: no Chest pain: no Lower extremity edema: no Dizzy/lightheaded: yes  Active Ambulatory Problems    Diagnosis Date Noted  . Diabetes mellitus with hyperglycemia, with long-term current use of insulin (HCC)   . Hyperlipidemia   . Benign hypertensive renal disease 06/08/2015  . History of drug abuse in remission 06/08/2015    Resolved Ambulatory Problems    Diagnosis Date Noted  . No Resolved Ambulatory Problems   Past Medical History  Diagnosis Date  . Diabetes mellitus without complication (HCC)    No Known Allergies History reviewed. No pertinent past surgical history.  Family History  Problem Relation Age of Onset  . Mental illness Mother   . Heart attack Father   . Heart disease Father   . Mental illness Sister    Social History   Social History  . Marital Status: Single    Spouse Name: N/A  . Number of Children: N/A  . Years of Education: N/A   Social History Main Topics  . Smoking status: Current Every Day Smoker -- 0.20 packs/day    Types: Cigarettes  . Smokeless tobacco: Never Used  . Alcohol Use: No  . Drug Use: No  . Sexual Activity: Not Currently   Other Topics Concern  . None   Social History Narrative    Review of Systems  Constitutional: Positive for fatigue. Negative for fever, chills, diaphoresis, activity change, appetite change and unexpected weight change.  Respiratory: Negative.   Cardiovascular: Negative.   Psychiatric/Behavioral: Negative.     Per HPI unless specifically indicated above     Objective:    BP 128/72 mmHg  Pulse 92  Temp(Src) 97.3 F (36.3 C)  Ht 5' 10.3" (1.786 m)  Wt 145 lb (65.772 kg)  BMI 20.62 kg/m2  SpO2 99%  Wt Readings from Last 3 Encounters:  06/08/15 145 lb (65.772 kg)  11/23/14 140 lb (63.504 kg)    Physical Exam  Constitutional: He is oriented  to person, place, and time. He appears well-developed and well-nourished. No distress.  HENT:  Head: Normocephalic and atraumatic.  Right Ear: Hearing normal.  Left Ear: Hearing normal.  Nose: Nose normal.  Eyes: Conjunctivae and lids are normal. Right eye exhibits no discharge. Left eye exhibits no discharge. No scleral icterus.  Cardiovascular: Normal rate, regular rhythm, normal heart sounds and intact distal pulses.  Exam reveals no gallop and no friction rub.   No  murmur heard. Pulmonary/Chest: Effort normal and breath sounds normal. No respiratory distress. He has no wheezes. He has no rales. He exhibits no tenderness.  Musculoskeletal: Normal range of motion.  Neurological: He is alert and oriented to person, place, and time.  Skin: Skin is warm, dry and intact. No rash noted. No erythema. No pallor.  Psychiatric: He has a normal mood and affect. His speech is normal and behavior is normal. Judgment and thought content normal. Cognition and memory are normal.  Nursing note and vitals reviewed.   Results for orders placed or performed in visit on 06/08/15  Microscopic Examination  Result Value Ref Range   WBC, UA None seen 0 -  5 /hpf   RBC, UA 3-10 (A) 0 -  2 /hpf   Epithelial Cells (non renal) None seen 0 - 10 /hpf   Bacteria, UA Few None seen/Few   Yeast, UA Present (A) None seen  Bayer DCA Hb A1c Waived  Result Value Ref Range   Bayer DCA Hb A1c Waived >14.0 (H) <7.0 %  CBC With Differential/Platelet  Result Value Ref Range   WBC 7.1 3.4 - 10.8 x10E3/uL   RBC 3.78 (L) 4.14 - 5.80 x10E6/uL   Hemoglobin 11.8 (L) 12.6 - 17.7 g/dL   Hematocrit 16.1 (L) 09.6 - 51.0 %   MCV 89 79 - 97 fL   MCH 31.2 26.6 - 33.0 pg   MCHC 34.9 31.5 - 35.7 g/dL   RDW 04.5 40.9 - 81.1 %   Platelets 235 150 - 379 x10E3/uL   Neutrophils 45 %   Lymphs 38 %   MID 17 %   Neutrophils Absolute 3.2 1.4 - 7.0 x10E3/uL   Lymphocytes Absolute 2.7 0.7 - 3.1 x10E3/uL   MID (Absolute) 1.2 0.1 - 1.6 X10E3/uL  Microalbumin, Urine Waived  Result Value Ref Range   Microalb, Ur Waived 150 (H) 0 - 19 mg/L   Creatinine, Urine Waived 100 10 - 300 mg/dL   Microalb/Creat Ratio >300 (H) <30 mg/g  UA/M w/rflx Culture, Routine  Result Value Ref Range   Specific Gravity, UA 1.020 1.005 - 1.030   pH, UA 5.5 5.0 - 7.5   Color, UA Yellow Yellow   Appearance Ur Clear Clear   Leukocytes, UA Negative Negative   Protein, UA 2+ (A) Negative/Trace   Glucose, UA 3+ (A) Negative    Ketones, UA Negative Negative   RBC, UA 2+ (A) Negative   Bilirubin, UA Negative Negative   Urobilinogen, Ur 1.0 0.2 - 1.0 mg/dL   Nitrite, UA Negative Negative   Microscopic Examination See below:       Assessment & Plan:   Problem List Items Addressed This Visit      Endocrine   Diabetes mellitus with hyperglycemia, with long-term current use of insulin (HCC)    Very poorly controlled with A1c >14 today. Will start him on invokanna and check in in 1 month. If sugars still very high, will consider trulicity. If not under better control in 3 months, will send to  endocrinology.      Relevant Medications   canagliflozin (INVOKANA) 300 MG TABS tablet   atorvastatin (LIPITOR) 40 MG tablet   insulin detemir (LEVEMIR) 100 UNIT/ML injection   insulin lispro (HUMALOG) 100 UNIT/ML injection   lisinopril (PRINIVIL,ZESTRIL) 10 MG tablet   metFORMIN (GLUMETZA) 500 MG (MOD) 24 hr tablet     Genitourinary   Benign hypertensive renal disease    Elevated today but better on recheck. Recheck next visit. Continue to monitor. Labs checked today.         Other   Hyperlipidemia    Checking labs today. Continue to monitor. Continue current regimen.       Relevant Medications   atorvastatin (LIPITOR) 40 MG tablet   lisinopril (PRINIVIL,ZESTRIL) 10 MG tablet   Other Relevant Orders   Comprehensive metabolic panel   Lipid Panel w/o Chol/HDL Ratio   History of drug abuse in remission    Continue to follow with the methadone clinic. Stable at this time. Congratulated him on his abstinence.        Other Visit Diagnoses    Immunization due    -  Primary    Flu given today. States that he's up to date on tdap and pneumo- will get records.     Relevant Orders    Flu Vaccine QUAD 36+ mos PF IM (Fluarix & Fluzone Quad PF) (Completed)        Follow up plan: Return in about 4 weeks (around 07/06/2015), or DM visit, for Records release please.

## 2015-06-08 NOTE — Assessment & Plan Note (Signed)
Elevated today but better on recheck. Recheck next visit. Continue to monitor. Labs checked today.

## 2015-06-08 NOTE — Patient Instructions (Signed)
ayr gel

## 2015-06-08 NOTE — Assessment & Plan Note (Signed)
Very poorly controlled with A1c >14 today. Will start him on invokanna and check in in 1 month. If sugars still very high, will consider trulicity. If not under better control in 3 months, will send to endocrinology.

## 2015-06-08 NOTE — Assessment & Plan Note (Signed)
Continue to follow with the methadone clinic. Stable at this time. Congratulated him on his abstinence.

## 2015-06-08 NOTE — Assessment & Plan Note (Signed)
Checking labs today. Continue to monitor. Continue current regimen.

## 2015-06-09 LAB — COMPREHENSIVE METABOLIC PANEL
A/G RATIO: 1 — AB (ref 1.1–2.5)
ALT: 359 IU/L — AB (ref 0–44)
AST: 171 IU/L — AB (ref 0–40)
Albumin: 3.6 g/dL (ref 3.5–5.5)
Alkaline Phosphatase: 209 IU/L — ABNORMAL HIGH (ref 39–117)
BILIRUBIN TOTAL: 0.4 mg/dL (ref 0.0–1.2)
BUN/Creatinine Ratio: 20 (ref 9–20)
BUN: 17 mg/dL (ref 6–24)
CHLORIDE: 89 mmol/L — AB (ref 96–106)
CO2: 26 mmol/L (ref 18–29)
Calcium: 8.8 mg/dL (ref 8.7–10.2)
Creatinine, Ser: 0.85 mg/dL (ref 0.76–1.27)
GFR calc non Af Amer: 107 mL/min/{1.73_m2} (ref >59)
GFR, EST AFRICAN AMERICAN: 124 mL/min/{1.73_m2} (ref >59)
GLOBULIN, TOTAL: 3.6 g/dL (ref 1.5–4.5)
Glucose: 395 mg/dL — ABNORMAL HIGH (ref 65–99)
POTASSIUM: 4.3 mmol/L (ref 3.5–5.2)
SODIUM: 129 mmol/L — AB (ref 134–144)
Total Protein: 7.2 g/dL (ref 6.0–8.5)

## 2015-06-09 LAB — TSH: TSH: 1.2 u[IU]/mL (ref 0.450–4.500)

## 2015-06-09 LAB — LIPID PANEL W/O CHOL/HDL RATIO
Cholesterol, Total: 169 mg/dL (ref 100–199)
HDL: 44 mg/dL (ref >39)
LDL Calculated: 76 mg/dL (ref 0–99)
TRIGLYCERIDES: 244 mg/dL — AB (ref 0–149)
VLDL Cholesterol Cal: 49 mg/dL — ABNORMAL HIGH (ref 5–40)

## 2015-06-10 ENCOUNTER — Telehealth: Payer: Self-pay | Admitting: Family Medicine

## 2015-06-10 DIAGNOSIS — B182 Chronic viral hepatitis C: Secondary | ICD-10-CM | POA: Insufficient documentation

## 2015-06-10 LAB — HEPATITIS PANEL, ACUTE
Hep A IgM: NEGATIVE
Hep B C IgM: NEGATIVE
Hep C Virus Ab: >11 s/co ratio — ABNORMAL HIGH (ref 0.0–0.9)
Hepatitis B Surface Ag: NEGATIVE

## 2015-06-10 LAB — SPECIMEN STATUS REPORT

## 2015-06-10 MED ORDER — AMOXICILLIN-POT CLAVULANATE 875-125 MG PO TABS: 1.0000 | ORAL_TABLET | Freq: Two times a day (BID) | ORAL | Status: DC

## 2015-06-10 NOTE — Telephone Encounter (Signed)
Patient has swelling in his mouth, please send antibiotics. Patient is coming in at 11am tomorrow

## 2015-06-10 NOTE — Telephone Encounter (Signed)
Rx sent to his pharmacy

## 2015-06-10 NOTE — Telephone Encounter (Signed)
Can we please get him in for an appointment to discuss labs ASAP? Thanks.

## 2015-06-11 ENCOUNTER — Encounter: Payer: Self-pay | Admitting: Family Medicine

## 2015-06-11 ENCOUNTER — Ambulatory Visit (INDEPENDENT_AMBULATORY_CARE_PROVIDER_SITE_OTHER): Payer: BLUE CROSS/BLUE SHIELD | Admitting: Family Medicine

## 2015-06-11 DIAGNOSIS — B182 Chronic viral hepatitis C: Secondary | ICD-10-CM | POA: Diagnosis not present

## 2015-06-11 NOTE — Assessment & Plan Note (Signed)
Newly diagnosed. Drawing genotype and viral load today. Checking on vaccinations for Hep A and hep B. RUQ US and elastography ordered today. Referral to GI made today. Discussed things to avoid including unprotected sex. He is aware. He is not drinking.

## 2015-06-11 NOTE — Progress Notes (Signed)
BP 124/80 mmHg  Pulse 93  Temp(Src) 97.9 F (36.6 C)  Ht 5' 10.3" (1.786 m)  Wt 143 lb (64.864 kg)  BMI 20.33 kg/m2  SpO2 99%   Subjective:    Patient ID: Jesse Christian, male    DOB: June 30, 1970, 45 y.o.   MRN: [id number]  HPI: Jesse Christian is a 45 y.o. male  Chief Complaint  Patient presents with  . Hepatitis C   HEPATITIS C Duration since diagnosis: diagnosed today Hep C transmission: unknown, history of IVDA Genotype: drawn today Viral load:  Drawn today Hepatology evaluation:no Liver biopsy:no  Cirrhosis: unknown Antiviral therapy:no Hepatocellular carcinoma screening: no Esophageal varices screening/EGD: no Hepatitis A Vaccine: unknown Hepatitis B Vaccine: unknown Pneumovax Vaccine: Up to Date  Relevant past medical, surgical, family and social history reviewed and updated as indicated. Interim medical history since our last visit reviewed. Allergies and medications reviewed and updated.  Review of Systems  Constitutional: Negative.   Respiratory: Negative.   Cardiovascular: Negative.   Musculoskeletal: Negative.   Psychiatric/Behavioral: Negative.     Per HPI unless specifically indicated above     Objective:    BP 124/80 mmHg  Pulse 93  Temp(Src) 97.9 F (36.6 C)  Ht 5' 10.3" (1.786 m)  Wt 143 lb (64.864 kg)  BMI 20.33 kg/m2  SpO2 99%  Wt Readings from Last 3 Encounters:  06/11/15 143 lb (64.864 kg)  06/08/15 145 lb (65.772 kg)  11/23/14 140 lb (63.504 kg)    Physical Exam  Constitutional: He is oriented to person, place, and time. He appears well-developed and well-nourished. No distress.  HENT:  Head: Normocephalic and atraumatic.  Right Ear: Hearing normal.  Left Ear: Hearing normal.  Nose: Nose normal.  Eyes: Conjunctivae and lids are normal. Right eye exhibits no discharge. Left eye exhibits no discharge. No scleral icterus.  Pulmonary/Chest: Effort normal. No respiratory distress.  Musculoskeletal: Normal range of motion.   Neurological: He is alert and oriented to person, place, and time.  Skin: Skin is warm, dry and intact. No rash noted. No erythema. No pallor.  Psychiatric: He has a normal mood and affect. His speech is normal and behavior is normal. Judgment and thought content normal. Cognition and memory are normal.  Nursing note and vitals reviewed.   Results for orders placed or performed in visit on 06/08/15  Microscopic Examination  Result Value Ref Range   WBC, UA None seen 0 -  5 /hpf   RBC, UA 3-10 (A) 0 -  2 /hpf   Epithelial Cells (non renal) None seen 0 - 10 /hpf   Bacteria, UA Few None seen/Few   Yeast, UA Present (A) None seen  Bayer DCA Hb A1c Waived  Result Value Ref Range   Bayer DCA Hb A1c Waived >14.0 (H) <7.0 %  CBC With Differential/Platelet  Result Value Ref Range   WBC 7.1 3.4 - 10.8 x10E3/uL   RBC 3.78 (L) 4.14 - 5.80 x10E6/uL   Hemoglobin 11.8 (L) 12.6 - 17.7 g/dL   Hematocrit 40.9 (L) 81.1 - 51.0 %   MCV 89 79 - 97 fL   MCH 31.2 26.6 - 33.0 pg   MCHC 34.9 31.5 - 35.7 g/dL   RDW 91.4 78.2 - 95.6 %   Platelets 235 150 - 379 x10E3/uL   Neutrophils 45 %   Lymphs 38 %   MID 17 %   Neutrophils Absolute 3.2 1.4 - 7.0 x10E3/uL   Lymphocytes Absolute 2.7 0.7 - 3.1 x10E3/uL  MID (Absolute) 1.2 0.1 - 1.6 X10E3/uL  Comprehensive metabolic panel  Result Value Ref Range   Glucose 395 (H) 65 - 99 mg/dL   BUN 17 6 - 24 mg/dL   Creatinine, Ser 4.090.85 0.76 - 1.27 mg/dL   GFR calc non Af Amer 107 >59 mL/min/1.73   GFR calc Af Amer 124 >59 mL/min/1.73   BUN/Creatinine Ratio 20 9 - 20   Sodium 129 (L) 134 - 144 mmol/L   Potassium 4.3 3.5 - 5.2 mmol/L   Chloride 89 (L) 96 - 106 mmol/L   CO2 26 18 - 29 mmol/L   Calcium 8.8 8.7 - 10.2 mg/dL   Total Protein 7.2 6.0 - 8.5 g/dL   Albumin 3.6 3.5 - 5.5 g/dL   Globulin, Total 3.6 1.5 - 4.5 g/dL   Albumin/Globulin Ratio 1.0 (L) 1.1 - 2.5   Bilirubin Total 0.4 0.0 - 1.2 mg/dL   Alkaline Phosphatase 209 (H) 39 - 117 IU/L   AST 171  (H) 0 - 40 IU/L   ALT 359 (H) 0 - 44 IU/L  Lipid Panel w/o Chol/HDL Ratio  Result Value Ref Range   Cholesterol, Total 169 100 - 199 mg/dL   Triglycerides 811244 (H) 0 - 149 mg/dL   HDL 44 >91>39 mg/dL   VLDL Cholesterol Cal 49 (H) 5 - 40 mg/dL   LDL Calculated 76 0 - 99 mg/dL  Microalbumin, Urine Waived  Result Value Ref Range   Microalb, Ur Waived 150 (H) 0 - 19 mg/L   Creatinine, Urine Waived 100 10 - 300 mg/dL   Microalb/Creat Ratio >300 (H) <30 mg/g  TSH  Result Value Ref Range   TSH 1.200 0.450 - 4.500 uIU/mL  UA/M w/rflx Culture, Routine  Result Value Ref Range   Specific Gravity, UA 1.020 1.005 - 1.030   pH, UA 5.5 5.0 - 7.5   Color, UA Yellow Yellow   Appearance Ur Clear Clear   Leukocytes, UA Negative Negative   Protein, UA 2+ (A) Negative/Trace   Glucose, UA 3+ (A) Negative   Ketones, UA Negative Negative   RBC, UA 2+ (A) Negative   Bilirubin, UA Negative Negative   Urobilinogen, Ur 1.0 0.2 - 1.0 mg/dL   Nitrite, UA Negative Negative   Microscopic Examination See below:   Hepatitis panel, acute  Result Value Ref Range   Hep A IgM Negative Negative   Hepatitis B Surface Ag Negative Negative   Hep B C IgM Negative Negative   Hep C Virus Ab >11.0 (H) 0.0 - 0.9 s/co ratio  Specimen status report  Result Value Ref Range   specimen status report Comment       Assessment & Plan:   Problem List Items Addressed This Visit      Digestive   Hep C w/o coma, chronic (HCC)    Newly diagnosed. Drawing genotype and viral load today. Checking on vaccinations for Hep A and hep B. RUQ US and elastography ordered today. Referral to GI made today. Discussed things to avoid including unprotected sex. He is aware. He is not drinking.       Relevant Orders   US Abdomen Limited RUQ   US ARFI ELASTOGRAPHY ADD ON   Ambulatory referral to Gastroenterology       Follow up plan: Return As scheduled.

## 2015-06-15 LAB — HEPATITIS B SURFACE ANTIBODY, QUANTITATIVE: Hepatitis B Surf Ab Quant: <3.1 m[IU]/mL — ABNORMAL LOW (ref >9.9)

## 2015-06-15 LAB — HEPATITIS C GENOTYPE

## 2015-06-15 LAB — HEPATITIS C VRS RNA DETECT BY PCR-QUAL: HCV RNA NAA Qualitative: POSITIVE — AB

## 2015-06-15 LAB — HIV ANTIBODY (ROUTINE TESTING W REFLEX): HIV Screen 4th Generation wRfx: NONREACTIVE

## 2015-06-21 ENCOUNTER — Ambulatory Visit: Payer: BLUE CROSS/BLUE SHIELD | Admitting: Family Medicine

## 2015-06-22 ENCOUNTER — Ambulatory Visit (INDEPENDENT_AMBULATORY_CARE_PROVIDER_SITE_OTHER): Payer: BLUE CROSS/BLUE SHIELD | Admitting: Family Medicine

## 2015-06-22 ENCOUNTER — Encounter: Payer: Self-pay | Admitting: Family Medicine

## 2015-06-22 VITALS — BP 171/103 | Temp 97.1°F | Ht 70.3 in | Wt 148.0 lb

## 2015-06-22 DIAGNOSIS — B182 Chronic viral hepatitis C: Secondary | ICD-10-CM | POA: Diagnosis not present

## 2015-06-22 DIAGNOSIS — G4700 Insomnia, unspecified: Secondary | ICD-10-CM

## 2015-06-22 DIAGNOSIS — K921 Melena: Secondary | ICD-10-CM | POA: Diagnosis not present

## 2015-06-22 DIAGNOSIS — I129 Hypertensive chronic kidney disease with stage 1 through stage 4 chronic kidney disease, or unspecified chronic kidney disease: Secondary | ICD-10-CM | POA: Diagnosis not present

## 2015-06-22 DIAGNOSIS — F418 Other specified anxiety disorders: Secondary | ICD-10-CM | POA: Diagnosis not present

## 2015-06-22 DIAGNOSIS — R197 Diarrhea, unspecified: Secondary | ICD-10-CM | POA: Diagnosis not present

## 2015-06-22 DIAGNOSIS — F064 Anxiety disorder due to known physiological condition: Secondary | ICD-10-CM | POA: Insufficient documentation

## 2015-06-22 MED ORDER — ESZOPICLONE 1 MG PO TABS: 1.0000 mg | ORAL_TABLET | Freq: Every evening | ORAL | Status: DC | PRN

## 2015-06-22 NOTE — Assessment & Plan Note (Signed)
Appointments given to patient today. He is aware of them and will go. US to be done next week and GI visit to be done the following visit.

## 2015-06-22 NOTE — Assessment & Plan Note (Addendum)
Discussed with patient that he cannot take benzodiazepines with his methadone due to the interaction. He is not happy about this and would like to see a psychiatrist. Referral made today. States that he has "tried everything" and nothing worked for him. Thinks that he will do better once he knows what's going on with his hep c. Concerned about sleeping and asks if he can have a sleeping pill. Will do short term low dose lunesta until he gets into see psychiatry. Refused buspar. Refused visteril. Refused any SSRI.

## 2015-06-22 NOTE — Assessment & Plan Note (Signed)
Patient left the office today without allowing us to recheck his blood pressure. Possibly anxiety related. Continue current regimen. Continue to monitor. DASH diet recommended.

## 2015-06-22 NOTE — Assessment & Plan Note (Signed)
Due to anxiety. Would like something short term to help him sleep. Has failed ambien and trazodone in the past. Will start him on low dose lunesta. Not to take more than 1 tab. Only to take it for 1 month. He is aware.

## 2015-06-22 NOTE — Progress Notes (Signed)
BP 171/103 mmHg  Temp(Src) 97.1 F (36.2 C)  Ht 5' 10.3" (1.786 m)  Wt 148 lb (67.132 kg)  BMI 21.05 kg/m2  SpO2 98%   Subjective:    Patient ID: Jesse Christian, male    DOB: 09-14-1970, 45 y.o.   MRN: [id number]  HPI: Jesse Christian is a 45 y.o. male  Chief Complaint  Patient presents with  . Anxiety    Patient states that he has had severe anxiety since he was diagnosed with Hepatitis C, he states that he is close to loosing his job due to this.   ANXIETY/STRESS- has been under a lot of stress due to his recent diagnosis. He states that this has thrown him for a loop, that he has not been feeling like himself at all since this happened.  Duration:exacerbated Anxious mood: yes  Excessive worrying: yes Irritability: yes  Sweating: yes Nausea: yes Palpitations:yes Hyperventilation: yes Panic attacks: yes Agoraphobia: yes  Obscessions/compulsions: no Depressed mood: yes Depression screen PHQ 2/9 06/22/2015  Decreased Interest 3  Down, Depressed, Hopeless 3  PHQ - 2 Score 6  Altered sleeping 3  Tired, decreased energy 2  Change in appetite 3  Feeling bad or failure about yourself  3  Trouble concentrating 3  Moving slowly or fidgety/restless 3  Suicidal thoughts 3  PHQ-9 Score 26   GAD 7 : Generalized Anxiety Score 06/22/2015  Nervous, Anxious, on Edge 3  Control/stop worrying 3  Worry too much - different things 3  Trouble relaxing 3  Restless 2  Easily annoyed or irritable 3  Afraid - awful might happen 3  Total GAD 7 Score 20  Anxiety Difficulty Extremely difficult    Anhedonia: no Weight changes: no Insomnia: yes hard to fall asleep  Hypersomnia: no Fatigue/loss of energy: yes Feelings of worthlessness: yes Feelings of guilt: yes Impaired concentration/indecisiveness: yes Suicidal ideations: yes  Crying spells: yes Recent Stressors/Life Changes: yes  Took ambien in the past without a benefit. Tried trazodone in the past without benefit.   Tooth  is better, since the antibiotics, has had nausea and was "pooping blood" has had belly pain, nausea, vomiting. Diarrhea since finishing antibiotics for tooth abscess. To see dentist later this week.   Relevant past medical, surgical, family and social history reviewed and updated as indicated. Interim medical history since our last visit reviewed. Allergies and medications reviewed and updated.  Review of Systems  Constitutional: Negative.   Respiratory: Negative.   Cardiovascular: Negative.   Gastrointestinal: Positive for nausea, vomiting, abdominal pain, diarrhea, blood in stool and anal bleeding. Negative for constipation, abdominal distention and rectal pain.  Genitourinary: Negative.   Skin: Negative.   Psychiatric/Behavioral: Positive for suicidal ideas, behavioral problems, dysphoric mood, decreased concentration and agitation. Negative for hallucinations, confusion, sleep disturbance and self-injury. The patient is nervous/anxious. The patient is not hyperactive.     Per HPI unless specifically indicated above     Objective:    BP 171/103 mmHg  Temp(Src) 97.1 F (36.2 C)  Ht 5' 10.3" (1.786 m)  Wt 148 lb (67.132 kg)  BMI 21.05 kg/m2  SpO2 98%  Wt Readings from Last 3 Encounters:  06/22/15 148 lb (67.132 kg)  06/11/15 143 lb (64.864 kg)  06/08/15 145 lb (65.772 kg)    Physical Exam  Constitutional: He is oriented to person, place, and time. He appears well-developed. He appears cachectic. No distress.  HENT:  Head: Normocephalic and atraumatic.  Right Ear: Hearing and external ear normal.  Left Ear: Hearing and external ear normal.  Nose: Nose normal.  Eyes: Conjunctivae, EOM and lids are normal. Pupils are equal, round, and reactive to light. Right eye exhibits no discharge. Left eye exhibits no discharge. No scleral icterus.  Pulmonary/Chest: Effort normal. No respiratory distress.  Abdominal: Soft. Bowel sounds are normal. He exhibits no distension and no mass.  There is no tenderness. There is no rebound and no guarding.  Musculoskeletal: Normal range of motion.  Neurological: He is alert and oriented to person, place, and time.  Skin: Skin is warm, dry and intact. No rash noted. No erythema. No pallor.  Psychiatric: His speech is normal and behavior is normal. Judgment and thought content normal. His mood appears anxious. Cognition and memory are normal.    Results for orders placed or performed in visit on 06/18/15  HM DIABETES EYE EXAM  Result Value Ref Range   HM Diabetic Eye Exam Retinopathy (A) No Retinopathy      Assessment & Plan:   Problem List Items Addressed This Visit      Digestive   Hep C w/o coma, chronic (HCC)    Appointments given to patient today. He is aware of them and will go. [place] to be done next week and GI visit to be done the following visit.         Genitourinary   Benign hypertensive renal disease    Patient left the office today without allowing [place] to recheck his blood pressure. Possibly anxiety related. Continue current regimen. Continue to monitor. DASH diet recommended.         Other   Anxiety disorder due to medical condition - Primary    Discussed with patient that he cannot take benzodiazepines with his methadone due to the interaction. He is not happy about this and would like to see a psychiatrist. Referral made today. States that he has "tried everything" and nothing worked for him. Thinks that he will do better once he knows what's going on with his hep c. Concerned about sleeping and asks if he can have a sleeping pill. Will do short term low dose lunesta until he gets into see psychiatry. Refused buspar. Refused visteril. Refused any SSRI.       Relevant Orders   Ambulatory referral to Psychiatry   Insomnia    Due to anxiety. Would like something short term to help him sleep. Has failed ambien and trazodone in the past. Will start him on low dose lunesta. Not to take more than 1 tab. Only to take it  for 1 month. He is aware.        Other Visit Diagnoses    Hematochezia        As it was after antibiotics, concern for c. diff. Will check stool studies- patient to bring them back in.     Relevant Orders    Ova and parasite examination    Fecal leukocytes    Stool C-Diff Toxin Assay    Stool Culture    Diarrhea, unspecified type        As it was after antibiotics, concern for c. diff. Will check stool studies- patient to bring them back in.     Relevant Orders    Ova and parasite examination    Fecal leukocytes    Stool C-Diff Toxin Assay    Stool Culture        Follow up plan: Return As scheduled.

## 2015-06-28 LAB — HCV RT-PCR, QUANT (NON-GRAPH)
HCV QUANTITATIVE BASELINE: 1960000 [IU]/mL
HCV Quantitative Log: 6.69 Log10copy/mL
HCV Quantitative: 4900000 Copies/mL
IU log10: 6.292 Log10 IU/mL

## 2015-06-28 LAB — SPECIMEN STATUS REPORT

## 2015-06-30 ENCOUNTER — Ambulatory Visit
Admission: RE | Admit: 2015-06-30 | Discharge: 2015-06-30 | Disposition: A | Discharge status: Home / Self Care | Payer: BLUE CROSS/BLUE SHIELD | Source: Ambulatory Visit | Attending: Family Medicine | Admitting: Family Medicine

## 2015-06-30 ENCOUNTER — Ambulatory Visit: Payer: BLUE CROSS/BLUE SHIELD

## 2015-06-30 DIAGNOSIS — K8020 Calculus of gallbladder without cholecystitis without obstruction: Secondary | ICD-10-CM | POA: Insufficient documentation

## 2015-06-30 DIAGNOSIS — B182 Chronic viral hepatitis C: Secondary | ICD-10-CM | POA: Diagnosis present

## 2015-07-05 ENCOUNTER — Ambulatory Visit (INDEPENDENT_AMBULATORY_CARE_PROVIDER_SITE_OTHER): Payer: BLUE CROSS/BLUE SHIELD | Admitting: Gastroenterology

## 2015-07-05 VITALS — BP 140/89 | HR 84 | Temp 98.2°F | Ht 73.0 in | Wt 143.0 lb

## 2015-07-05 DIAGNOSIS — B182 Chronic viral hepatitis C: Secondary | ICD-10-CM | POA: Diagnosis not present

## 2015-07-05 NOTE — Progress Notes (Signed)
And   Gastroenterology Consultation  Referring Provider:     Dorcas Carrow, DO Primary Care Physician:  Olevia Perches, DO Primary Gastroenterologist:  Dr. Servando Snare     Reason for Consultation:      Hepatitis C        HPI:   Jesse Christian is a 45 y.o. y/o male referred for consultation & management of  Hepatitis C by Dr. Olevia Perches, DO.   This patient comes in today with a report of being hepatitis C positive. The patient was found to have a genotype of 1a. He also had a positive viral load. He reports risk factors including tattoos and snorting cocaine. The patient was only recently diagnosed with hepatitis C. He denies any jaundice. The patient also had a ultrasound with fibrosis scan showing F2/F3. The patient has never been treated for his hepatitis C before.  Past Medical History  Diagnosis Date  . Diabetes mellitus without complication (HCC)   . Hyperlipidemia     No past surgical history on file.  Prior to Admission medications   Medication Sig Start Date End Date Taking? Authorizing Provider  aspirin 81 MG tablet Take 162 mg by mouth daily.    Historical Provider, MD  atorvastatin (LIPITOR) 40 MG tablet Take 1 tablet (40 mg total) by mouth daily. 06/08/15   [name] [name], DO  canagliflozin (INVOKANA) 300 MG TABS tablet Take 1 tablet (300 mg total) by mouth daily before breakfast. 06/08/15   [name] [name], DO  eszopiclone (LUNESTA) 1 MG TABS tablet Take 1 tablet (1 mg total) by mouth at bedtime as needed for sleep. Take immediately before bedtime 06/22/15   [name] [name], DO  insulin detemir (LEVEMIR) 100 UNIT/ML injection Inject 0.3 mLs (30 Units total) into the skin 2 (two) times daily. 06/08/15   [name] [name], DO  insulin lispro (HUMALOG) 100 UNIT/ML injection Inject 0.05-0.15 mLs (5-15 Units total) into the skin 3 (three) times daily. Sliding scale 06/08/15   [name] [name], DO  lisinopril (PRINIVIL,ZESTRIL) 10 MG tablet Take 1 tablet (10 mg total) by mouth daily.  06/08/15   [name] [name], DO  metFORMIN (GLUMETZA) 500 MG (MOD) 24 hr tablet Take 2 tablets (1,000 mg total) by mouth 2 (two) times daily. 06/08/15   [name] [name], DO  methadone (DOLOPHINE) 10 MG tablet Take by mouth.    Historical Provider, MD  Omega-3 Fatty Acids (FISH OIL) 1000 MG CAPS Take 1 capsule by mouth daily.    Historical Provider, MD    Family History  Problem Relation Age of Onset  . Mental illness Mother   . Heart attack Father   . Heart disease Father   . Mental illness Sister      Social History  Substance Use Topics  . Smoking status: Current Every Day Smoker -- 0.20 packs/day    Types: Cigarettes  . Smokeless tobacco: Never Used  . Alcohol Use: No    Allergies as of 07/05/2015  . (No Known Allergies)    Review of Systems:    All systems reviewed and negative except where noted in HPI.   Physical Exam:  BP 140/89 mmHg  Pulse 84  Temp(Src) 98.2 F (36.8 C) (Oral)  Ht  (1.854 m)  Wt 143 lb (64.864 kg)  BMI 18.87 kg/m2 No LMP for male patient. Psych:  Alert and cooperative. Normal mood and affect. General:   Alert,  Well-developed, well-nourished, pleasant and cooperative in NAD Head:  Normocephalic  and atraumatic. Eyes:  Sclera clear, no icterus.   Conjunctiva pink. Ears:  Normal auditory acuity. Nose:  No deformity, discharge, or lesions. Mouth:  No deformity or lesions,oropharynx pink & moist. Neck:  Supple; no masses or thyromegaly. Lungs:  Respirations even and unlabored.  Clear throughout to auscultation.   No wheezes, crackles, or rhonchi. No acute distress. Heart:  Regular rate and rhythm; no murmurs, clicks, rubs, or gallops. Abdomen:  Normal bowel sounds.  No bruits.  Soft, non-tender and non-distended without masses, hepatosplenomegaly or hernias noted.  No guarding or rebound tenderness.  Negative Carnett sign.   Rectal:  Deferred.  Msk:  Symmetrical without gross deformities.  Good, equal movement & strength bilaterally. Pulses:   Normal pulses noted. Extremities:  No clubbing or edema.  No cyanosis. Neurologic:  Alert and oriented x3;  grossly normal neurologically. Skin:  Intact without significant lesions or rashes.  No jaundice. Lymph Nodes:  No significant cervical adenopathy. Psych:  Alert and cooperative. Normal mood and affect.  Imaging Studies: [place] Arfi Elastography Add On  06/30/2015  CLINICAL DATA:  Chronic hepatitis-C. EXAM: ULTRASOUND ABDOMEN COMPLETE ULTRASOUND HEPATIC ELASTOGRAPHY TECHNIQUE: Sonography of the upper abdomen was performed. In addition, ultrasound elastography evaluation of the liver was performed. A region of interest was placed within the right lobe of the liver. Following application of a compressive sonographic pulse, shear waves were detected in the adjacent hepatic tissue and the shear wave velocity was calculated. Multiple assessments were performed at the selected site. Median shear wave velocity is correlated to a Metavir fibrosis score. COMPARISON:  None. FINDINGS: ULTRASOUND ABDOMEN Gallbladder: Moderate layering echogenic sludge and small gallstones. No gallbladder wall thickening, pericholecystic fluid or sonographic Murphy sign. Common bile duct: Diameter: 3.9 mm Liver: Diffuse increased echogenicity may suggest fatty infiltration. No focal hepatic lesions or intrahepatic biliary dilatation. ULTRASOUND HEPATIC ELASTOGRAPHY Device: Siemens Helix VTQ Patient position:  Supine Transducer 6 C1 HD Number of measurements:  10 Hepatic Segment:  8 Median velocity:   1.74  m/sec IQR: 0.11 IQR/Median velocity ratio 0.06 Corresponding Metavir fibrosis score:  F2/F3 Risk of fibrosis: Moderate Limitations of exam: None Pertinent findings noted on other imaging exams:  None Please note that abnormal shear wave velocities may also be identified in clinical settings other than with hepatic fibrosis, such as: acute hepatitis, elevated right heart and central venous pressures including use of beta blockers,  veno-occlusive disease (Budd-Chiari), infiltrative processes such as mastocytosis/amyloidosis/infiltrative tumor, extrahepatic cholestasis, in the post-prandial state, and liver transplantation. Correlation with patient history, laboratory data, and clinical condition recommended. IMPRESSION: 1. Moderate layering echogenic sludge in the gallbladder along with small gallstones but no findings for acute cholecystitis. 2. Normal caliber common bile duct. Median hepatic shear wave velocity is calculated at 1.74 m/sec. Corresponding Metavir fibrosis score is F 2/F3. Risk of fibrosis is moderate. Follow-up:  Additional testing appropriate. Electronically Signed   By: Rudie Meyer M.D.   On: 06/30/2015 09:25   US Abdomen Limited Ruq  06/30/2015  CLINICAL DATA:  Chronic hepatitis-C. EXAM: ULTRASOUND ABDOMEN COMPLETE ULTRASOUND HEPATIC ELASTOGRAPHY TECHNIQUE: Sonography of the upper abdomen was performed. In addition, ultrasound elastography evaluation of the liver was performed. A region of interest was placed within the right lobe of the liver. Following application of a compressive sonographic pulse, shear waves were detected in the adjacent hepatic tissue and the shear wave velocity was calculated. Multiple assessments were performed at the selected site. Median shear wave velocity is correlated to a Metavir fibrosis score. COMPARISON:  None. FINDINGS: ULTRASOUND ABDOMEN Gallbladder: Moderate layering echogenic sludge and small gallstones. No gallbladder wall thickening, pericholecystic fluid or sonographic Murphy sign. Common bile duct: Diameter: 3.9 mm Liver: Diffuse increased echogenicity may suggest fatty infiltration. No focal hepatic lesions or intrahepatic biliary dilatation. ULTRASOUND HEPATIC ELASTOGRAPHY Device: Siemens Helix VTQ Patient position:  Supine Transducer 6 C1 HD Number of measurements:  10 Hepatic Segment:  8 Median velocity:   1.74  m/sec IQR: 0.11 IQR/Median velocity ratio 0.06 Corresponding  Metavir fibrosis score:  F2/F3 Risk of fibrosis: Moderate Limitations of exam: None Pertinent findings noted on other imaging exams:  None Please note that abnormal shear wave velocities may also be identified in clinical settings other than with hepatic fibrosis, such as: acute hepatitis, elevated right heart and central venous pressures including use of beta blockers, veno-occlusive disease (Budd-Chiari), infiltrative processes such as mastocytosis/amyloidosis/infiltrative tumor, extrahepatic cholestasis, in the post-prandial state, and liver transplantation. Correlation with patient history, laboratory data, and clinical condition recommended. IMPRESSION: 1. Moderate layering echogenic sludge in the gallbladder along with small gallstones but no findings for acute cholecystitis. 2. Normal caliber common bile duct. Median hepatic shear wave velocity is calculated at 1.74 m/sec. Corresponding Metavir fibrosis score is F 2/F3. Risk of fibrosis is moderate. Follow-up:  Additional testing appropriate. Electronically Signed   By: Rudie MeyerP.  Gallerani M.D.   On: 06/30/2015 09:25    Assessment and Plan:   Jesse Christian is a 45 y.o. y/o male  Who was found to have hepatitis C genotype 1a with a viral load being positive and he fibrosis scanned showing F2/F3. The patient will be set up for lab work to look for other causes of abnormal liver enzymes. He will also be set up to start treatment for his hepatitis C. The patient has been explained the plan and agrees with it.   Note: This dictation was prepared with Dragon dictation along with smaller phrase technology. Any transcriptional errors that result from this process are unintentional.

## 2015-07-06 ENCOUNTER — Telehealth: Payer: Self-pay | Admitting: General Surgery

## 2015-07-06 ENCOUNTER — Telehealth: Payer: Self-pay | Admitting: Family Medicine

## 2015-07-06 LAB — COMPREHENSIVE METABOLIC PANEL
ALK PHOS: 260 IU/L — AB (ref 39–117)
ALT: 116 IU/L — AB (ref 0–44)
AST: 65 IU/L — AB (ref 0–40)
Albumin/Globulin Ratio: 1.1 — ABNORMAL LOW (ref 1.2–2.2)
Albumin: 3.9 g/dL (ref 3.5–5.5)
BUN/Creatinine Ratio: 26 — ABNORMAL HIGH (ref 9–20)
BUN: 23 mg/dL (ref 6–24)
Bilirubin Total: 0.4 mg/dL (ref 0.0–1.2)
CALCIUM: 9 mg/dL (ref 8.7–10.2)
CO2: 22 mmol/L (ref 18–29)
CREATININE: 0.88 mg/dL (ref 0.76–1.27)
Chloride: 83 mmol/L — ABNORMAL LOW (ref 96–106)
GFR calc Af Amer: 122 mL/min/{1.73_m2} (ref >59)
GFR calc non Af Amer: 106 mL/min/{1.73_m2} (ref >59)
GLUCOSE: 684 mg/dL — AB (ref 65–99)
Globulin, Total: 3.6 g/dL (ref 1.5–4.5)
Potassium: 4.9 mmol/L (ref 3.5–5.2)
SODIUM: 121 mmol/L — AB (ref 134–144)
Total Protein: 7.5 g/dL (ref 6.0–8.5)

## 2015-07-06 LAB — ALPHA-1-ANTITRYPSIN: A1 ANTITRYPSIN: 153 mg/dL (ref 90–200)

## 2015-07-06 LAB — ANTI-SMOOTH MUSCLE ANTIBODY, IGG: SMOOTH MUSCLE AB: 42 U — AB (ref 0–19)

## 2015-07-06 LAB — PROTIME-INR
INR: 1 (ref 0.8–1.2)
PROTHROMBIN TIME: 10.5 s (ref 9.1–12.0)

## 2015-07-06 LAB — HEPATITIS B SURFACE ANTIGEN: Hepatitis B Surface Ag: NEGATIVE

## 2015-07-06 LAB — MITOCHONDRIAL ANTIBODIES: Mitochondrial Ab: 20.2 Units — ABNORMAL HIGH (ref 0.0–20.0)

## 2015-07-06 LAB — HEPATITIS A ANTIBODY, TOTAL: Hep A Total Ab: NEGATIVE

## 2015-07-06 LAB — CERULOPLASMIN: CERULOPLASMIN: 43 mg/dL — AB (ref 16.0–31.0)

## 2015-07-06 NOTE — Telephone Encounter (Signed)
Called and left a message for patient to return my call.  

## 2015-07-06 NOTE — Telephone Encounter (Signed)
Received call from [facility] reporting a critical glucose level of over 600.  Offices are currently closed.  Will Route this result to the appropriate office members per this telephone consult.  Ricarda Frameharles Kairav Russomanno, MD FACS General Surgeon [facility]

## 2015-07-06 NOTE — Telephone Encounter (Signed)
We have a call out to him. Thanks for the heads up!

## 2015-07-06 NOTE — Telephone Encounter (Signed)
Spoke with pt and he had recently checked his sugar and it was 342. I then advised pt that if it ever got to 500 he needed to go to the ER.

## 2015-07-06 NOTE — Telephone Encounter (Signed)
[facility] to inform Dr Laural BenesJohnson of a critical report from Dr Servando SnareWohl that the pts glucose was greater than 684 yesterday.

## 2015-07-06 NOTE — Telephone Encounter (Signed)
Can you please have him come in and get his sugar checked, because if it's that high, he needs to go to the emergency room. Thanks.

## 2015-07-06 NOTE — Telephone Encounter (Signed)
Your pt was seen by Dr. Servando SnareWohl yesterday for his Hep C. Please review pt's glucose level. Dr. Tonita CongWoodham received a critical result call from [facility] yesterday evening. Dr. Servando SnareWohl would like you to be aware of this level. He has a scheduled appt with you tomorrow.

## 2015-07-07 ENCOUNTER — Encounter: Payer: Self-pay | Admitting: Family Medicine

## 2015-07-07 ENCOUNTER — Ambulatory Visit (INDEPENDENT_AMBULATORY_CARE_PROVIDER_SITE_OTHER): Payer: BLUE CROSS/BLUE SHIELD | Admitting: Family Medicine

## 2015-07-07 VITALS — BP 138/80 | HR 92 | Temp 97.3°F | Ht 71.3 in | Wt 144.0 lb

## 2015-07-07 DIAGNOSIS — Z794 Long term (current) use of insulin: Secondary | ICD-10-CM

## 2015-07-07 DIAGNOSIS — E1365 Other specified diabetes mellitus with hyperglycemia: Secondary | ICD-10-CM

## 2015-07-07 DIAGNOSIS — I129 Hypertensive chronic kidney disease with stage 1 through stage 4 chronic kidney disease, or unspecified chronic kidney disease: Secondary | ICD-10-CM

## 2015-07-07 DIAGNOSIS — Z23 Encounter for immunization: Secondary | ICD-10-CM

## 2015-07-07 LAB — GLUCOSE HEMOCUE WAIVED: Glu Hemocue Waived: 415 mg/dL — ABNORMAL HIGH (ref 65–99)

## 2015-07-07 MED ORDER — CANAGLIFLOZIN 300 MG PO TABS: 300.0000 mg | ORAL_TABLET | Freq: Every day | ORAL | Status: DC

## 2015-07-07 NOTE — Progress Notes (Signed)
BP 138/80 mmHg  Pulse 92  Temp(Src) 97.3 F (36.3 C)  Ht 5' 11.3" (1.811 m)  Wt 144 lb (65.318 kg)  BMI 19.92 kg/m2  SpO2 99%   Subjective:    Patient ID: Jesse Christian, male    DOB: [birth year], 45 y.o.   MRN: [id number]  HPI: Jesse Christian is a 45 y.o. male  Chief Complaint  Patient presents with  . Diabetes    Patient states that he needs his Invokana resent to the pharmacy   DIABETES- sugar 684 at GI, didn't take his insulin and was drinking sugar drinks. Has been more careful. Sugar 480 this AM and 415 today in the office. Insulin working. Feeling better. Hasn't started invokanna due to issue with pharmacy. Hypoglycemic episodes:no Polydipsia/polyuria: yes Visual disturbance: yes Chest pain: no Paresthesias: no Glucose Monitoring: yes  Accucheck frequency: TID Taking Insulin?: yes Blood Pressure Monitoring: not checking Retinal Examination: Up to Date Foot Exam: Up to Date Diabetic Education: Completed Pneumovax: Up to Date Influenza: Up to Date Aspirin: no  Starting on Harvoni in the next 2 weeks. Stress has continued, but feeling better. Not going to see psychiatry.  Relevant past medical, surgical, family and social history reviewed and updated as indicated. Interim medical history since our last visit reviewed. Allergies and medications reviewed and updated.  Review of Systems  Constitutional: Negative.   Respiratory: Negative.   Cardiovascular: Negative.   Psychiatric/Behavioral: Negative.     Per HPI unless specifically indicated above     Objective:    BP 138/80 mmHg  Pulse 92  Temp(Src) 97.3 F (36.3 C)  Ht 5' 11.3" (1.811 m)  Wt 144 lb (65.318 kg)  BMI 19.92 kg/m2  SpO2 99%  Wt Readings from Last 3 Encounters:  07/07/15 144 lb (65.318 kg)  07/05/15 143 lb (64.864 kg)  06/22/15 148 lb (67.132 kg)    Physical Exam  Constitutional: He is oriented to person, place, and time. He appears well-developed and well-nourished. No distress.   HENT:  Head: Normocephalic and atraumatic.  Right Ear: Hearing normal.  Left Ear: Hearing normal.  Nose: Nose normal.  Eyes: Conjunctivae and lids are normal. Right eye exhibits no discharge. Left eye exhibits no discharge. No scleral icterus.  Cardiovascular: Normal rate, regular rhythm, normal heart sounds and intact distal pulses.  Exam reveals no gallop and no friction rub.   No murmur heard. Pulmonary/Chest: Effort normal and breath sounds normal. No respiratory distress. He has no wheezes. He has no rales. He exhibits no tenderness.  Musculoskeletal: Normal range of motion.  Neurological: He is alert and oriented to person, place, and time.  Skin: Skin is warm, dry and intact. No rash noted. He is not diaphoretic. No erythema. No pallor.  Psychiatric: He has a normal mood and affect. His speech is normal and behavior is normal. Judgment and thought content normal. Cognition and memory are normal.  Nursing note and vitals reviewed.   Results for orders placed or performed in visit on 07/05/15  Ceruloplasmin  Result Value Ref Range   Ceruloplasmin 43.0 (H) 16.0 - 31.0 mg/dL  Comprehensive metabolic panel  Result Value Ref Range   Glucose 684 (>) 65 - 99 mg/dL   BUN 23 6 - 24 mg/dL   Creatinine, Ser 6.210.88 0.76 - 1.27 mg/dL   GFR calc non Af Amer 106 >59 mL/min/1.73   GFR calc Af Amer 122 >59 mL/min/1.73   BUN/Creatinine Ratio 26 (H) 9 - 20   Sodium 121 (L)  134 - 144 mmol/L   Potassium 4.9 3.5 - 5.2 mmol/L   Chloride 83 (L) 96 - 106 mmol/L   CO2 22 18 - 29 mmol/L   Calcium 9.0 8.7 - 10.2 mg/dL   Total Protein 7.5 6.0 - 8.5 g/dL   Albumin 3.9 3.5 - 5.5 g/dL   Globulin, Total 3.6 1.5 - 4.5 g/dL   Albumin/Globulin Ratio 1.1 (L) 1.2 - 2.2   Bilirubin Total 0.4 0.0 - 1.2 mg/dL   Alkaline Phosphatase 260 (H) 39 - 117 IU/L   AST 65 (H) 0 - 40 IU/L   ALT 116 (H) 0 - 44 IU/L  Protime-INR  Result Value Ref Range   INR 1.0 0.8 - 1.2   Prothrombin Time 10.5 9.1 - 12.0 sec   Anti-smooth muscle antibody, IgG  Result Value Ref Range   Smooth Muscle Ab 42 (H) 0 - 19 Units  Mitochondrial antibodies  Result Value Ref Range   Mitochondrial Ab 20.2 (H) 0.0 - 20.0 Units  Alpha-1-antitrypsin  Result Value Ref Range   A-1 Antitrypsin 153 90 - 200 mg/dL  Hepatitis B surface antigen  Result Value Ref Range   Hepatitis B Surface Ag Negative Negative  Hepatitis A antibody, total  Result Value Ref Range   Hep A Total Ab Negative Negative      Assessment & Plan:   Problem List Items Addressed This Visit      Endocrine   Diabetes mellitus with hyperglycemia, with long-term current use of insulin (HCC) - Primary    Has not been taking his invokanna, sugars very high. Start invokanna, recheck 3 weeks.       Relevant Medications   canagliflozin (INVOKANA) 300 MG TABS tablet   Other Relevant Orders   Glucose Hemocue Waived (STAT)     Genitourinary   Benign hypertensive renal disease    Much better on recheck. Continue current regimen, continue to monitor.        Other Visit Diagnoses    Immunization due        Pneumovax given today        Follow up plan: Return 3 weeks.

## 2015-07-07 NOTE — Assessment & Plan Note (Signed)
Has not been taking his invokanna, sugars very high. Start invokanna, recheck 3 weeks.

## 2015-07-07 NOTE — Assessment & Plan Note (Signed)
Much better on recheck. Continue current regimen, continue to monitor.

## 2015-07-28 ENCOUNTER — Ambulatory Visit (INDEPENDENT_AMBULATORY_CARE_PROVIDER_SITE_OTHER): Payer: BLUE CROSS/BLUE SHIELD | Admitting: Family Medicine

## 2015-07-28 ENCOUNTER — Encounter: Payer: Self-pay | Admitting: Family Medicine

## 2015-07-28 VITALS — BP 126/82 | HR 83 | Temp 96.5°F | Wt 148.0 lb

## 2015-07-28 DIAGNOSIS — S66802A Unspecified injury of other specified muscles, fascia and tendons at wrist and hand level, left hand, initial encounter: Secondary | ICD-10-CM

## 2015-07-28 DIAGNOSIS — E1365 Other specified diabetes mellitus with hyperglycemia: Secondary | ICD-10-CM

## 2015-07-28 DIAGNOSIS — Z794 Long term (current) use of insulin: Secondary | ICD-10-CM
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
## 2015-07-28 NOTE — Assessment & Plan Note (Signed)
Sugars not significantly better with invokanna. Due for A1c next month. Will check at that point to see if decreasing. May benefit from victoza/trulicity- will see what A1c is next month and consider adding it if OK with GI. Work on diet and exercise. Continue to monitor. Call with any concerns.

## 2015-08-26 ENCOUNTER — Ambulatory Visit: Payer: BLUE CROSS/BLUE SHIELD | Admitting: Family Medicine

## 2015-08-31 ENCOUNTER — Other Ambulatory Visit: Payer: Self-pay

## 2015-08-31 DIAGNOSIS — B182 Chronic viral hepatitis C: Secondary | ICD-10-CM

## 2015-09-03 ENCOUNTER — Ambulatory Visit: Payer: BLUE CROSS/BLUE SHIELD | Admitting: Family Medicine

## 2015-09-09 ENCOUNTER — Other Ambulatory Visit: Payer: Self-pay

## 2015-09-09 DIAGNOSIS — B182 Chronic viral hepatitis C: Secondary | ICD-10-CM

## 2015-09-28 ENCOUNTER — Telehealth: Payer: Self-pay | Admitting: Gastroenterology

## 2015-09-28 NOTE — Telephone Encounter (Signed)
Patient is cancelling his appointment because he doesn't have the money for his co pay and he doesn't have the money for lab work. He mentioned that his insurance didn't want to pay for it last time. Please advise him on what to do.

## 2015-09-29 ENCOUNTER — Ambulatory Visit: Payer: Self-pay | Admitting: Gastroenterology

## 2015-09-29 NOTE — Telephone Encounter (Signed)
Patient noticed you called and was returning your call

## 2015-09-29 NOTE — Telephone Encounter (Signed)
Left vm letting pt know he needs his follow up appt with Dr. Servando SnareWohl because he finished his Hep C treatment. If he doesn't have the copay, he can still have his labs done to check his viral load, but we do need a follow up appt at some point. Advised him the lab order is in the system and to go when he can.

## 2015-10-21 ENCOUNTER — Other Ambulatory Visit
Admission: RE | Admit: 2015-10-21 | Discharge: 2015-10-21 | Disposition: A | Discharge status: Home / Self Care | Payer: BLUE CROSS/BLUE SHIELD | Source: Ambulatory Visit | Attending: Gastroenterology | Admitting: Gastroenterology

## 2015-10-21 DIAGNOSIS — B182 Chronic viral hepatitis C: Secondary | ICD-10-CM | POA: Insufficient documentation

## 2015-10-21 LAB — HEPATIC FUNCTION PANEL
ALT: 12 U/L — AB (ref 17–63)
AST: 15 U/L (ref 15–41)
Albumin: 3.9 g/dL (ref 3.5–5.0)
Alkaline Phosphatase: 99 U/L (ref 38–126)
Bilirubin, Direct: <0.1 mg/dL — ABNORMAL LOW (ref 0.1–0.5)
TOTAL PROTEIN: 7.6 g/dL (ref 6.5–8.1)
Total Bilirubin: 0.7 mg/dL (ref 0.3–1.2)

## 2015-10-21 NOTE — Telephone Encounter (Signed)
Pt notified of lab results

## 2015-10-21 NOTE — Telephone Encounter (Signed)
-----   Message from Midge Miniumarren Wohl, MD sent at 10/21/2015  1:01 PM EDT ----- Let the patient know that his liver enzymes are now normal.

## 2015-10-22 LAB — HCV RNA BY PCR, QN RFX GENO
HCV RNA Qnt(log copy/mL): UNDETERMINED log10 IU/mL
HepC Qn: NOT DETECTED IU/mL

## 2015-10-25 ENCOUNTER — Telehealth: Payer: Self-pay

## 2015-10-25 NOTE — Telephone Encounter (Signed)
-----   Message from Midge Miniumarren Wohl, MD sent at 10/25/2015 11:28 AM EDT ----- Let the patient know the HCV was negative.

## 2015-10-25 NOTE — Telephone Encounter (Signed)
Pt notified of lab results. Pt added to recall list.

## 2015-11-15 ENCOUNTER — Other Ambulatory Visit: Payer: Self-pay | Admitting: Family Medicine

## 2015-11-18 ENCOUNTER — Other Ambulatory Visit: Payer: Self-pay | Admitting: Family Medicine

## 2015-12-26 ENCOUNTER — Encounter: Payer: Self-pay | Admitting: Emergency Medicine

## 2015-12-26 ENCOUNTER — Inpatient Hospital Stay
Admission: EM | Admit: 2015-12-26 | Discharge: 2015-12-27 | DRG: 638 | Disposition: A | Discharge status: Home / Self Care | Payer: BLUE CROSS/BLUE SHIELD | Attending: Internal Medicine | Admitting: Internal Medicine

## 2015-12-26 DIAGNOSIS — Z7982 Long term (current) use of aspirin: Secondary | ICD-10-CM

## 2015-12-26 DIAGNOSIS — Z79899 Other long term (current) drug therapy: Secondary | ICD-10-CM | POA: Diagnosis not present

## 2015-12-26 DIAGNOSIS — Z8249 Family history of ischemic heart disease and other diseases of the circulatory system: Secondary | ICD-10-CM | POA: Diagnosis not present

## 2015-12-26 DIAGNOSIS — E1310 Other specified diabetes mellitus with ketoacidosis without coma: Principal | ICD-10-CM | POA: Diagnosis present

## 2015-12-26 DIAGNOSIS — F1121 Opioid dependence, in remission: Secondary | ICD-10-CM | POA: Diagnosis present

## 2015-12-26 DIAGNOSIS — R739 Hyperglycemia, unspecified: Secondary | ICD-10-CM | POA: Diagnosis present

## 2015-12-26 DIAGNOSIS — T383X6A Underdosing of insulin and oral hypoglycemic [antidiabetic] drugs, initial encounter: Secondary | ICD-10-CM | POA: Diagnosis present

## 2015-12-26 DIAGNOSIS — F17210 Nicotine dependence, cigarettes, uncomplicated: Secondary | ICD-10-CM | POA: Diagnosis present

## 2015-12-26 DIAGNOSIS — Z794 Long term (current) use of insulin: Secondary | ICD-10-CM

## 2015-12-26 DIAGNOSIS — E860 Dehydration: Secondary | ICD-10-CM

## 2015-12-26 DIAGNOSIS — Y92009 Unspecified place in unspecified non-institutional (private) residence as the place of occurrence of the external cause: Secondary | ICD-10-CM | POA: Diagnosis not present

## 2015-12-26 DIAGNOSIS — A084 Viral intestinal infection, unspecified: Secondary | ICD-10-CM | POA: Diagnosis present

## 2015-12-26 DIAGNOSIS — N179 Acute kidney failure, unspecified: Secondary | ICD-10-CM | POA: Diagnosis present

## 2015-12-26 DIAGNOSIS — E785 Hyperlipidemia, unspecified: Secondary | ICD-10-CM | POA: Diagnosis present

## 2015-12-26 DIAGNOSIS — E1365 Other specified diabetes mellitus with hyperglycemia: Secondary | ICD-10-CM

## 2015-12-26 DIAGNOSIS — Z91128 Patient's intentional underdosing of medication regimen for other reason: Secondary | ICD-10-CM | POA: Diagnosis not present

## 2015-12-26 LAB — COMPREHENSIVE METABOLIC PANEL
ALBUMIN: 4.1 g/dL (ref 3.5–5.0)
ALK PHOS: 83 U/L (ref 38–126)
ALT: 17 U/L (ref 17–63)
AST: 16 U/L (ref 15–41)
Anion gap: 26 — ABNORMAL HIGH (ref 5–15)
BUN: 50 mg/dL — ABNORMAL HIGH (ref 6–20)
CALCIUM: 9.1 mg/dL (ref 8.9–10.3)
CO2: 19 mmol/L — AB (ref 22–32)
CREATININE: 1.43 mg/dL — AB (ref 0.61–1.24)
Chloride: 82 mmol/L — ABNORMAL LOW (ref 101–111)
GFR calc Af Amer: >60 mL/min (ref >60)
GFR calc non Af Amer: 59 mL/min — ABNORMAL LOW (ref >60)
GLUCOSE: 563 mg/dL — AB (ref 65–99)
Potassium: 4.2 mmol/L (ref 3.5–5.1)
SODIUM: 127 mmol/L — AB (ref 135–145)
Total Bilirubin: 2.2 mg/dL — ABNORMAL HIGH (ref 0.3–1.2)
Total Protein: 7.5 g/dL (ref 6.5–8.1)

## 2015-12-26 LAB — BLOOD GAS, VENOUS
Acid-base deficit: 3.6 mmol/L — ABNORMAL HIGH (ref 0.0–2.0)
BICARBONATE: 23.1 mmol/L (ref 20.0–28.0)
PCO2 VEN: 47 mmHg (ref 44.0–60.0)
Patient temperature: 37
pH, Ven: 7.3 (ref 7.250–7.430)

## 2015-12-26 LAB — GLUCOSE, CAPILLARY
GLUCOSE-CAPILLARY: 426 mg/dL — AB (ref 65–99)
Glucose-Capillary: 552 mg/dL (ref 65–99)
Glucose-Capillary: 351 mg/dL — ABNORMAL HIGH (ref 65–99)

## 2015-12-26 LAB — URINALYSIS COMPLETE WITH MICROSCOPIC (ARMC ONLY)
Bacteria, UA: NONE SEEN
Bilirubin Urine: NEGATIVE
Leukocytes, UA: NEGATIVE
Nitrite: NEGATIVE
Protein, ur: 30 mg/dL — AB
SPECIFIC GRAVITY, URINE: 1.02 (ref 1.005–1.030)
SQUAMOUS EPITHELIAL / LPF: NONE SEEN
pH: 5 (ref 5.0–8.0)

## 2015-12-26 LAB — CBC
HCT: 41.1 % (ref 40.0–52.0)
Hemoglobin: 14 g/dL (ref 13.0–18.0)
MCH: 30.6 pg (ref 26.0–34.0)
MCHC: 34.1 g/dL (ref 32.0–36.0)
MCV: 89.7 fL (ref 80.0–100.0)
PLATELETS: 267 10*3/uL (ref 150–440)
RBC: 4.58 MIL/uL (ref 4.40–5.90)
RDW: 12.4 % (ref 11.5–14.5)
WBC: 10.9 10*3/uL — ABNORMAL HIGH (ref 3.8–10.6)

## 2015-12-26 LAB — LIPASE, BLOOD: Lipase: 14 U/L (ref 11–51)

## 2015-12-26 MED ORDER — INSULIN ASPART 100 UNIT/ML ~~LOC~~ SOLN
10.0000 [IU] | Freq: Once | SUBCUTANEOUS | Status: AC
  Administered 2015-12-26: 10 [IU] via SUBCUTANEOUS

## 2015-12-26 MED ORDER — SODIUM CHLORIDE 0.9 % IV BOLUS (SEPSIS)
1000.0000 mL | Freq: Once | INTRAVENOUS | Status: AC
  Administered 2015-12-26: 1000 mL via INTRAVENOUS

## 2015-12-26 MED ORDER — SODIUM CHLORIDE 0.9 % IV SOLN
INTRAVENOUS | Status: DC
  Administered 2015-12-26: 20:00:00 via INTRAVENOUS

## 2015-12-26 MED ORDER — SUCRALFATE 1 G PO TABS
1.0000 g | ORAL_TABLET | Freq: Three times a day (TID) | ORAL | Status: DC
  Administered 2015-12-26 – 2015-12-27 (×3): 1 g via ORAL
  Filled 2015-12-26 (×3): qty 1

## 2015-12-26 MED ORDER — PROMETHAZINE HCL 25 MG/ML IJ SOLN
INTRAMUSCULAR | Status: AC
  Administered 2015-12-26: 12.5 mg via INTRAVENOUS
  Filled 2015-12-26: qty 1

## 2015-12-26 MED ORDER — INSULIN DETEMIR 100 UNIT/ML ~~LOC~~ SOLN
30.0000 [IU] | Freq: Two times a day (BID) | SUBCUTANEOUS | Status: DC
  Administered 2015-12-26 – 2015-12-27 (×2): 30 [IU] via SUBCUTANEOUS
  Filled 2015-12-26 (×3): qty 0.3

## 2015-12-26 MED ORDER — ONDANSETRON HCL 4 MG/2ML IJ SOLN
4.0000 mg | Freq: Once | INTRAMUSCULAR | Status: AC | PRN
  Administered 2015-12-26: 4 mg via INTRAVENOUS
  Filled 2015-12-26: qty 2

## 2015-12-26 MED ORDER — METHADONE HCL 10 MG PO TABS
130.0000 mg | ORAL_TABLET | Freq: Every day | ORAL | Status: DC
  Administered 2015-12-27: 130 mg via ORAL
  Filled 2015-12-26: qty 13

## 2015-12-26 MED ORDER — METFORMIN HCL ER 500 MG PO TB24
1000.0000 mg | ORAL_TABLET | Freq: Two times a day (BID) | ORAL | Status: DC
  Administered 2015-12-27: 1000 mg via ORAL
  Filled 2015-12-26: qty 2

## 2015-12-26 MED ORDER — PROMETHAZINE HCL 25 MG/ML IJ SOLN
12.5000 mg | Freq: Once | INTRAMUSCULAR | Status: AC
  Administered 2015-12-26: 12.5 mg via INTRAVENOUS

## 2015-12-26 MED ORDER — INSULIN ASPART 100 UNIT/ML ~~LOC~~ SOLN
0.0000 [IU] | Freq: Three times a day (TID) | SUBCUTANEOUS | Status: DC
  Administered 2015-12-27: 3 [IU] via SUBCUTANEOUS
  Administered 2015-12-27: 2 [IU] via SUBCUTANEOUS
  Filled 2015-12-26: qty 3
  Filled 2015-12-26: qty 2

## 2015-12-26 MED ORDER — CANAGLIFLOZIN 300 MG PO TABS: 300.0000 mg | ORAL_TABLET | Freq: Every day | ORAL | Status: DC

## 2015-12-26 MED ORDER — ASPIRIN EC 81 MG PO TBEC
81.0000 mg | DELAYED_RELEASE_TABLET | Freq: Every day | ORAL | Status: DC
  Administered 2015-12-27: 81 mg via ORAL
  Filled 2015-12-26: qty 1

## 2015-12-26 MED ORDER — ZOLPIDEM TARTRATE 5 MG PO TABS: 5.0000 mg | ORAL_TABLET | Freq: Every evening | ORAL | Status: DC | PRN

## 2015-12-26 MED ORDER — ATORVASTATIN CALCIUM 20 MG PO TABS
40.0000 mg | ORAL_TABLET | Freq: Every day | ORAL | Status: DC
  Administered 2015-12-26 – 2015-12-27 (×2): 40 mg via ORAL
  Filled 2015-12-26 (×2): qty 2

## 2015-12-26 MED ORDER — HEPARIN SODIUM (PORCINE) 5000 UNIT/ML IJ SOLN
5000.0000 [IU] | Freq: Three times a day (TID) | INTRAMUSCULAR | Status: DC
  Administered 2015-12-26 – 2015-12-27 (×2): 5000 [IU] via SUBCUTANEOUS
  Filled 2015-12-26 (×2): qty 1

## 2015-12-26 MED ORDER — PANTOPRAZOLE SODIUM 40 MG PO TBEC
40.0000 mg | DELAYED_RELEASE_TABLET | Freq: Every day | ORAL | Status: DC
  Administered 2015-12-26 – 2015-12-27 (×2): 40 mg via ORAL
  Filled 2015-12-26 (×2): qty 1

## 2015-12-26 MED ORDER — INSULIN ASPART 100 UNIT/ML ~~LOC~~ SOLN
SUBCUTANEOUS | Status: AC
  Administered 2015-12-26: 10 [IU] via SUBCUTANEOUS
  Filled 2015-12-26: qty 10

## 2015-12-26 MED ORDER — ONDANSETRON HCL 4 MG/2ML IJ SOLN: 4.0000 mg | Freq: Four times a day (QID) | INTRAMUSCULAR | Status: DC | PRN

## 2015-12-26 MED ORDER — LISINOPRIL 10 MG PO TABS
10.0000 mg | ORAL_TABLET | Freq: Every day | ORAL | Status: DC
  Administered 2015-12-26 – 2015-12-27 (×2): 10 mg via ORAL
  Filled 2015-12-26 (×2): qty 1

## 2015-12-26 NOTE — ED Provider Notes (Signed)
[facility] Emergency Department Provider Note  ____________________________________________   I have reviewed the triage vital signs and the nursing notes.   HISTORY  Chief Complaint Emesis and Hyperglycemia    HPI Jesse Christian is a 45 y.o. male with a history of poor diabetic control on insulin, states he has had nausea vomiting diarrhea for the last few days with no significant abdominal pain. No fever. No melena bright red blood per rectum no hematemesis, he states however because he has been having copious diarrhea and vomiting he stopped taking his insulin he feels dehydrated. Denies headache stiff neck chest pain and shortness of breath. He denies recent travel or periods states he had a toothache of about 3 or 4 days ago and took a half of a antibiotic pill that he had "lying around". No other antibiotics.    Past Medical History:  Diagnosis Date  . Diabetes mellitus without complication (HCC)   . Hyperlipidemia     Patient Active Problem List   Diagnosis Date Noted  . Anxiety disorder due to medical condition 06/22/2015  . Insomnia 06/22/2015  . Hep C w/o coma, chronic (HCC) 06/10/2015  . Benign hypertensive renal disease 06/08/2015  . History of drug abuse in remission 06/08/2015  . Diabetes mellitus with hyperglycemia, with long-term current use of insulin (HCC)   . Hyperlipidemia     History reviewed. No pertinent surgical history.  Prior to Admission medications   Medication Sig Start Date End Date Taking? Authorizing Provider  aspirin 81 MG tablet Take 162 mg by mouth daily.    Historical Provider, MD  atorvastatin (LIPITOR) 40 MG tablet Take 1 tablet (40 mg total) by mouth daily. 06/08/15   [name] [name], DO  canagliflozin (INVOKANA) 300 MG TABS tablet Take 1 tablet (300 mg total) by mouth daily before breakfast. 07/07/15   [name] [name], DO  eszopiclone (LUNESTA) 1 MG TABS tablet Take 1 tablet (1 mg total) by mouth at bedtime as  needed for sleep. Take immediately before bedtime 06/22/15   [name] [name], DO  HARVONI 90-400 MG TABS TAKE ONE TABLET BY MOUTH EVERY DAY with or without food 07/08/15   Historical Provider, MD  LEVEMIR 100 UNIT/ML injection INJECT 0.3 MLS (30 UNITS TOTAL) INTO THE SKIN 2 (TWO) TIMES DAILY. 11/16/15   [name] [name], DO  lisinopril (PRINIVIL,ZESTRIL) 10 MG tablet Take 1 tablet (10 mg total) by mouth daily. 06/08/15   [name] [name], DO  metFORMIN (GLUCOPHAGE-XR) 500 MG 24 hr tablet TAKE 2 TABLETS (1,000 MG TOTAL) BY MOUTH 2 (TWO) TIMES DAILY. 11/18/15   [name] [name], DO  methadone (DOLOPHINE) 10 MG tablet Take 130 mg by mouth daily.     Historical Provider, MD  NOVOLOG 100 UNIT/ML injection INJECT 0.05-0.15 MLS (5-15 UNITS TOTAL) INTO THE SKIN 3 (THREE) TIMES DAILY. SLIDING SCALE 11/16/15   Megan Holly BodilyP [name], DO    Allergies Review of patient's allergies indicates no known allergies.  Family History  Problem Relation Age of Onset  . Mental illness Mother   . Heart attack Father   . Heart disease Father   . Mental illness Sister     Social History Social History  Substance Use Topics  . Smoking status: Current Every Day Smoker    Packs/day: 0.20    Types: Cigarettes  . Smokeless tobacco: Never Used  . Alcohol use No    Review of Systems Constitutional: No fever/chills Eyes: No visual changes. ENT: No sore throat.  No stiff neck no neck pain Cardiovascular: Denies chest pain. Respiratory: Denies shortness of breath. Gastrointestinal:   See history of present illness. Genitourinary: Negative for dysuria. Musculoskeletal: Negative lower extremity swelling Skin: Negative for rash. Neurological: Negative for severe headaches, focal weakness or numbness. 10-point ROS otherwise negative.  ____________________________________________   PHYSICAL EXAM:  VITAL SIGNS: ED Triage Vitals [12/26/15 1645]  Enc Vitals Group     BP (!) 164/93     Pulse Rate 94     Resp 18     Temp 98.1  F (36.7 C)     Temp Source Oral     SpO2 98 %     Weight 140 lb (63.5 kg)     Height 6' (1.829 m)     Head Circumference      Peak Flow      Pain Score 5     Pain Loc      Pain Edu?      Excl. in GC?     Constitutional: Alert and oriented. Well appearing and in no acute distress. Eyes: Conjunctivae are normal. PERRL. EOMI. Head: Atraumatic. Nose: No congestion/rhinnorhea. Mouth/Throat: Mucous membranes are Dry.  Oropharynx non-erythematous. Neck: No stridor.   Nontender with no meningismus Cardiovascular: Normal rate, regular rhythm. Grossly normal heart sounds.  Good peripheral circulation. Respiratory: Normal respiratory effort.  No retractions. Lungs CTAB. Abdominal: Soft and nontender. No distention. No guarding no rebound Back:  There is no focal tenderness or step off.  there is no midline tenderness there are no lesions noted. there is no CVA tenderness Musculoskeletal: No lower extremity tenderness, no upper extremity tenderness. No joint effusions, no DVT signs strong distal pulses no edema Neurologic:  Normal speech and language. No gross focal neurologic deficits are appreciated.  Skin:  Skin is warm, dry and intact. No rash noted. Psychiatric: Mood and affect are normal. Speech and behavior are normal.  ____________________________________________   LABS (all labs ordered are listed, but only abnormal results are displayed)  Labs Reviewed  COMPREHENSIVE METABOLIC PANEL - Abnormal; Notable for the following:       Result Value   Sodium 127 (*)    Chloride 82 (*)    CO2 19 (*)    Glucose, Bld 563 (*)    BUN 50 (*)    Creatinine, Ser 1.43 (*)    Total Bilirubin 2.2 (*)    GFR calc non Af Amer 59 (*)    Anion gap 26 (*)    All other components within normal limits  CBC - Abnormal; Notable for the following:    WBC 10.9 (*)    All other components within normal limits  URINALYSIS COMPLETEWITH MICROSCOPIC ([facility] ONLY) - Abnormal; Notable for the following:     Color, Urine STRAW (*)    APPearance CLEAR (*)    Glucose, UA >500 (*)    Ketones, ur 2+ (*)    Hgb urine dipstick 2+ (*)    Protein, ur 30 (*)    All other components within normal limits  GLUCOSE, CAPILLARY - Abnormal; Notable for the following:    Glucose-Capillary >600 (*)    All other components within normal limits  BLOOD GAS, VENOUS - Abnormal; Notable for the following:    pO2, Ven <31.0 (*)    Acid-base deficit 3.6 (*)    All other components within normal limits  GLUCOSE, CAPILLARY - Abnormal; Notable for the following:    Glucose-Capillary 552 (*)    All other components within  normal limits  LIPASE, BLOOD   ____________________________________________  EKG  I personally interpreted any EKGs ordered by me or triage  ____________________________________________  RADIOLOGY  I reviewed any imaging ordered by me or triage that were performed during my shift and, if possible, patient and/or family made aware of any abnormal findings. ____________________________________________   PROCEDURES  Procedure(s) performed: None  Procedures  Critical Care performed: None  ____________________________________________   INITIAL IMPRESSION / ASSESSMENT AND PLAN / ED COURSE  Pertinent labs & imaging results that were available during my care of the patient were reviewed by me and considered in my medical decision making (see chart for details).  Patient with nausea vomiting diarrhea abdomen is benign however he is dehydrated, he has evidence of decompensated hyperglycemia with an anion gap. This will likely close with fluids. Given an insulin bolus. I don't think he needs an insulin drip. He is closing rapidly with fluids. I talked to the hospitalist, they agree with plan and will admit patient for further evaluation. Patient's sugar is elevated but he stopped taking his insulin 3 days ago  Clinical Course   ____________________________________________   FINAL  CLINICAL IMPRESSION(S) / ED DIAGNOSES  Final diagnoses:  None      This chart was dictated using voice recognition software.  Despite best efforts to proofread,  errors can occur which can change meaning.      Jeanmarie Plant, MD 12/26/15 [phone]

## 2015-12-26 NOTE — ED Notes (Signed)
Report called to Lori, RN.

## 2015-12-26 NOTE — H&P (Signed)
[facility] [place] at [facility]   PATIENT NAME: Jesse Christian    MR#:  [id number]  DATE OF BIRTH:  04/30/1970  DATE OF ADMISSION:  12/26/2015  PRIMARY CARE PHYSICIAN: Olevia Perches, DO   REQUESTING/REFERRING PHYSICIAN: McShane  CHIEF COMPLAINT:   Chief Complaint  Patient presents with  . Emesis  . Hyperglycemia    HISTORY OF PRESENT ILLNESS: Jesse Christian  is a 45 y.o. male with a known history of Dm, hyperlipidemia- came to ER after having 3-4 days of nausea and vomit- and not able to eat and take his insulin , so  In ER his blood sugar was noted high. Given Inj Insulin, brought down some, but as continued to have nausea, given for admission. He takes ASA daily, and Ibuprofen some times for pain. Had some chills for last few days.  PAST MEDICAL HISTORY:   Past Medical History:  Diagnosis Date  . Diabetes mellitus without complication (HCC)   . Hyperlipidemia     PAST SURGICAL HISTORY: History reviewed. No pertinent surgical history.  SOCIAL HISTORY:  Social History  Substance Use Topics  . Smoking status: Current Every Day Smoker    Packs/day: 0.50    Types: Cigarettes  . Smokeless tobacco: Never Used  . Alcohol use No    FAMILY HISTORY:  Family History  Problem Relation Age of Onset  . Mental illness Mother   . Heart attack Father   . Heart disease Father   . Mental illness Sister     DRUG ALLERGIES: No Known Allergies  REVIEW OF SYSTEMS:   CONSTITUTIONAL: No fever, fatigue or weakness.  EYES: No blurred or double vision.  EARS, NOSE, AND THROAT: No tinnitus or ear pain.  RESPIRATORY: No cough, shortness of breath, wheezing or hemoptysis.  CARDIOVASCULAR: No chest pain, orthopnea, edema.  GASTROINTESTINAL: positive for nausea, vomiting,no diarrhea or abdominal pain.  GENITOURINARY: No dysuria, hematuria.  ENDOCRINE: No polyuria, nocturia,  HEMATOLOGY: No anemia, easy bruising or bleeding SKIN: No rash or lesion. MUSCULOSKELETAL: No  joint pain or arthritis.   NEUROLOGIC: No tingling, numbness, weakness.  PSYCHIATRY: No anxiety or depression.   MEDICATIONS AT HOME:  Prior to Admission medications   Medication Sig Start Date End Date Taking? Authorizing Provider  aspirin 81 MG tablet Take 81 mg by mouth daily.    Yes Historical Provider, MD  canagliflozin (INVOKANA) 300 MG TABS tablet Take 1 tablet (300 mg total) by mouth daily before breakfast. 07/07/15  Yes [name] [name], DO  insulin lispro (HUMALOG) 100 UNIT/ML injection Inject 5-15 Units into the skin 3 (three) times daily with meals.   Yes Historical Provider, MD  LEVEMIR 100 UNIT/ML injection INJECT 0.3 MLS (30 UNITS TOTAL) INTO THE SKIN 2 (TWO) TIMES DAILY. 11/16/15  Yes [name] [name], DO  metFORMIN (GLUCOPHAGE-XR) 500 MG 24 hr tablet TAKE 2 TABLETS (1,000 MG TOTAL) BY MOUTH 2 (TWO) TIMES DAILY. 11/18/15  Yes [name] [name], DO  methadone (DOLOPHINE) 10 MG tablet Take 130 mg by mouth daily.    Yes Historical Provider, MD  atorvastatin (LIPITOR) 40 MG tablet Take 1 tablet (40 mg total) by mouth daily. Patient not taking: Reported on 12/26/2015 06/08/15   [name] [name], DO  eszopiclone (LUNESTA) 1 MG TABS tablet Take 1 tablet (1 mg total) by mouth at bedtime as needed for sleep. Take immediately before bedtime Patient not taking: Reported on 12/26/2015 06/22/15   [name] [name], DO  HARVONI 90-400 MG TABS TAKE ONE TABLET  BY MOUTH EVERY DAY with or without food 07/08/15   Historical Provider, MD  lisinopril (PRINIVIL,ZESTRIL) 10 MG tablet Take 1 tablet (10 mg total) by mouth daily. Patient not taking: Reported on 12/26/2015 06/08/15   Dorcas Carrow, DO      PHYSICAL EXAMINATION:   VITAL SIGNS: Blood pressure (!) 164/93, pulse 94, temperature 98.1 F (36.7 C), temperature source Oral, resp. rate 18, height 6' (1.829 m), weight 63.5 kg (140 lb), SpO2 98 %.  GENERAL:  45 y.o.-year-old patient lying in the bed with no acute distress.  EYES: Pupils equal, round,  reactive to light and accommodation. No scleral icterus. Extraocular muscles intact.  HEENT: Head atraumatic, normocephalic. Oropharynx and nasopharynx clear.  NECK:  Supple, no jugular venous distention. No thyroid enlargement, no tenderness.  LUNGS: Normal breath sounds bilaterally, no wheezing, rales,rhonchi or crepitation. No use of accessory muscles of respiration.  CARDIOVASCULAR: S1, S2 normal. No murmurs, rubs, or gallops.  ABDOMEN: Soft, nontender, nondistended. Bowel sounds present. No organomegaly or mass.  EXTREMITIES: No pedal edema, cyanosis, or clubbing.  NEUROLOGIC: Cranial nerves II through XII are intact. Muscle strength 5/5 in all extremities. Sensation intact. Gait not checked.  PSYCHIATRIC: The patient is alert and oriented x 3.  SKIN: No obvious rash, lesion, or ulcer.   LABORATORY PANEL:   CBC  Recent Labs Lab 12/26/15 1656  WBC 10.9*  HGB 14.0  HCT 41.1  PLT 267  MCV 89.7  MCH 30.6  MCHC 34.1  RDW 12.4   ------------------------------------------------------------------------------------------------------------------  Chemistries   Recent Labs Lab 12/26/15 1656  NA 127*  K 4.2  CL 82*  CO2 19*  GLUCOSE 563*  BUN 50*  CREATININE 1.43*  CALCIUM 9.1  AST 16  ALT 17  ALKPHOS 83  BILITOT 2.2*   ------------------------------------------------------------------------------------------------------------------ estimated creatinine clearance is 59.8 mL/min (by C-G formula based on SCr of 1.43 mg/dL (H)). ------------------------------------------------------------------------------------------------------------------ No results for input(s): TSH, T4TOTAL, T3FREE, THYROIDAB in the last 72 hours.  Invalid input(s): FREET3   Coagulation profile No results for input(s): INR, PROTIME in the last 168 hours. ------------------------------------------------------------------------------------------------------------------- No results for input(s):  DDIMER in the last 72 hours. -------------------------------------------------------------------------------------------------------------------  Cardiac Enzymes No results for input(s): CKMB, TROPONINI, MYOGLOBIN in the last 168 hours.  Invalid input(s): CK ------------------------------------------------------------------------------------------------------------------ Invalid input(s): POCBNP  ---------------------------------------------------------------------------------------------------------------  Urinalysis    Component Value Date/Time   COLORURINE STRAW (A) 12/26/2015 1656   APPEARANCEUR CLEAR (A) 12/26/2015 1656   APPEARANCEUR Clear 06/08/2015 1521   LABSPEC 1.020 12/26/2015 1656   LABSPEC 1.022 11/27/2012 0630   PHURINE 5.0 12/26/2015 1656   GLUCOSEU >500 (A) 12/26/2015 1656   GLUCOSEU 150 mg/dL 11/91/4782 9562   HGBUR 2+ (A) 12/26/2015 1656   BILIRUBINUR NEGATIVE 12/26/2015 1656   BILIRUBINUR Negative 06/08/2015 1521   BILIRUBINUR Negative 11/27/2012 0630   KETONESUR 2+ (A) 12/26/2015 1656   PROTEINUR 30 (A) 12/26/2015 1656   UROBILINOGEN 0.2 10/06/2014 1718   NITRITE NEGATIVE 12/26/2015 1656   LEUKOCYTESUR NEGATIVE 12/26/2015 1656   LEUKOCYTESUR Negative 06/08/2015 1521   LEUKOCYTESUR Negative 11/27/2012 0630     RADIOLOGY: No results found.  EKG: Orders placed or performed during the hospital encounter of 10/06/14  . ED EKG  . ED EKG  . EKG 12-Lead  . EKG 12-Lead  . EKG    IMPRESSION AND PLAN:  * Hyperglycemia   Inj Insulin given blood sugar came down from 600 to 450.   Resume Home dose lantus.   Keep on SCC insulin.  *  Nausea and vomit   Likely viral gastroenteritis   IV fluids and zofran.    May be gastritis or PUD?    Protonix and sucralfate.  * Smoking   Councelled to quit for 4 min.  * Opioid addiction   Currently on methadone.   Cont.  All the records are reviewed and case discussed with ED provider. Management plans  discussed with the patient, family and they are in agreement.  CODE STATUS: full. Code Status History    This patient does not have a recorded code status. Please follow your organizational policy for patients in this situation.       TOTAL TIME TAKING CARE OF THIS PATIENT: 50 minutes.    Altamese DillingVACHHANI, Laraina Sulton M.D on 12/26/2015   Between 7am to 6pm - Pager - [phone]  After 6pm go to [email or web address] - password [email or web address]  [facility]  Office  [phone]  CC: Primary care physician; Olevia PerchesMegan [name], DO   Note: This dictation was prepared with Dragon dictation along with smaller phrase technology. Any transcriptional errors that result from this process are unintentional.

## 2015-12-26 NOTE — ED Triage Notes (Signed)
C/O Emesis since Wednesday.  Also c/o abdominal cramping and constant nausea.  Also noticed blood sugar has been elevated since unable to take insulin due to emesis.

## 2015-12-27 LAB — CBC
HEMATOCRIT: 34.8 % — AB (ref 40.0–52.0)
Hemoglobin: 12.1 g/dL — ABNORMAL LOW (ref 13.0–18.0)
MCH: 30.4 pg (ref 26.0–34.0)
MCHC: 34.7 g/dL (ref 32.0–36.0)
MCV: 87.7 fL (ref 80.0–100.0)
PLATELETS: 237 10*3/uL (ref 150–440)
RBC: 3.96 MIL/uL — ABNORMAL LOW (ref 4.40–5.90)
RDW: 12 % (ref 11.5–14.5)
WBC: 9 10*3/uL (ref 3.8–10.6)

## 2015-12-27 LAB — BASIC METABOLIC PANEL
Anion gap: 11 (ref 5–15)
BUN: 28 mg/dL — AB (ref 6–20)
CHLORIDE: 96 mmol/L — AB (ref 101–111)
CO2: 26 mmol/L (ref 22–32)
CREATININE: 0.89 mg/dL (ref 0.61–1.24)
Calcium: 8.2 mg/dL — ABNORMAL LOW (ref 8.9–10.3)
GFR calc Af Amer: >60 mL/min (ref >60)
GFR calc non Af Amer: >60 mL/min (ref >60)
GLUCOSE: 238 mg/dL — AB (ref 65–99)
POTASSIUM: 3.5 mmol/L (ref 3.5–5.1)
SODIUM: 133 mmol/L — AB (ref 135–145)

## 2015-12-27 LAB — GLUCOSE, CAPILLARY
Glucose-Capillary: 216 mg/dL — ABNORMAL HIGH (ref 65–99)
Glucose-Capillary: 178 mg/dL — ABNORMAL HIGH (ref 65–99)

## 2015-12-27 NOTE — Discharge Summary (Signed)
[facility] - [place] at [facility]   PATIENT NAME: Jesse Christian    MR#:  [id number]  DATE OF BIRTH:  03-Sep-1970  DATE OF ADMISSION:  12/26/2015 ADMITTING PHYSICIAN: Enedina FinnerSona Faustina Gebert, MD  DATE OF DISCHARGE: 12/27/15  PRIMARY CARE PHYSICIAN: Olevia PerchesMegan [name], DO    ADMISSION DIAGNOSIS:  Dehydration [E86.0] Hyperglycemia [R73.9]  DISCHARGE DIAGNOSIS:  Uncontrolled DM-2 with mild DKA-resolved Acute renal fialure-resolved Acute GE-resolved  SECONDARY DIAGNOSIS:   Past Medical History:  Diagnosis Date  . Diabetes mellitus without complication (HCC)   . Hyperlipidemia     HOSPITAL COURSE:  Jesse GlowBarry Shoe  is a 45 y.o. male with a known history of Dm, hyperlipidemia- came to ER after having 3-4 days of nausea and vomit- and not able to eat and take his insulin , so  In ER his blood sugar was noted high.  * Uncontrolled DM-2 with mild DKA   Inj Insulin given blood sugar came down from 600 to 450. Did not need Insulin gtt   Resume Home dose lantus   Keep on SQ insulin.  * Nausea and vomit-resolved   Likely viral gastroenteritis   recieved IV fluids and zofran. toerating soft diet    * Smoking   Councelled to quit for 4 min.  * Opioid addiction   Currently on methadone.    Overall at baseline. Will d/c to go home  CONSULTS OBTAINED:    DRUG ALLERGIES:  No Known Allergies  DISCHARGE MEDICATIONS:   Current Discharge Medication List    CONTINUE these medications which have NOT CHANGED   Details  aspirin 81 MG tablet Take 81 mg by mouth daily.     canagliflozin (INVOKANA) 300 MG TABS tablet Take 1 tablet (300 mg total) by mouth daily before breakfast. Qty: 30 tablet, Refills: 3    insulin lispro (HUMALOG) 100 UNIT/ML injection Inject 5-15 Units into the skin 3 (three) times daily with meals.    LEVEMIR 100 UNIT/ML injection INJECT 0.3 MLS (30 UNITS TOTAL) INTO THE SKIN 2 (TWO) TIMES DAILY. Qty: 10 mL, Refills: 3    metFORMIN (GLUCOPHAGE-XR)  500 MG 24 hr tablet TAKE 2 TABLETS (1,000 MG TOTAL) BY MOUTH 2 (TWO) TIMES DAILY. Qty: 120 tablet, Refills: 0    methadone (DOLOPHINE) 10 MG tablet Take 130 mg by mouth daily.     atorvastatin (LIPITOR) 40 MG tablet Take 1 tablet (40 mg total) by mouth daily. Qty: 30 tablet, Refills: 6    eszopiclone (LUNESTA) 1 MG TABS tablet Take 1 tablet (1 mg total) by mouth at bedtime as needed for sleep. Take immediately before bedtime Qty: 30 tablet, Refills: 0    HARVONI 90-400 MG TABS TAKE ONE TABLET BY MOUTH EVERY DAY with or without food Refills: 1    lisinopril (PRINIVIL,ZESTRIL) 10 MG tablet Take 1 tablet (10 mg total) by mouth daily. Qty: 30 tablet, Refills: 3        If you experience worsening of your admission symptoms, develop shortness of breath, life threatening emergency, suicidal or homicidal thoughts you must seek medical attention immediately by calling 911 or calling your MD immediately  if symptoms less severe.  You Must read complete instructions/literature along with all the possible adverse reactions/side effects for all the Medicines you take and that have been prescribed to you. Take any new Medicines after you have completely understood and accept all the possible adverse reactions/side effects.   Please note  You were cared for by a hospitalist during your hospital stay.  If you have any questions about your discharge medications or the care you received while you were in the hospital after you are discharged, you can call the unit and asked to speak with the hospitalist on call if the hospitalist that took care of you is not available. Once you are discharged, your primary care physician will handle any further medical issues. Please note that NO REFILLS for any discharge medications will be authorized once you are discharged, as it is imperative that you return to your primary care physician (or establish a relationship with a primary care physician if you do not have one)  for your aftercare needs so that they can reassess your need for medications and monitor your lab values. Today   SUBJECTIVE   No complaints. Wants his methadone  VITAL SIGNS:  Blood pressure 139/86, pulse 89, temperature 98.8 F (37.1 C), temperature source Oral, resp. rate 16, height 6' (1.829 m), weight 63.5 kg (139 lb 14.4 oz), SpO2 98 %.  I/O:   Intake/Output Summary (Last 24 hours) at 12/27/15 1035 Last data filed at 12/27/15 0900  Gross per 24 hour  Intake              920 ml  Output             1050 ml  Net             -130 ml    PHYSICAL EXAMINATION:  GENERAL:  45 y.o.-year-old patient lying in the bed with no acute distress.  EYES: Pupils equal, round, reactive to light and accommodation. No scleral icterus. Extraocular muscles intact.  HEENT: Head atraumatic, normocephalic. Oropharynx and nasopharynx clear.  NECK:  Supple, no jugular venous distention. No thyroid enlargement, no tenderness.  LUNGS: Normal breath sounds bilaterally, no wheezing, rales,rhonchi or crepitation. No use of accessory muscles of respiration.  CARDIOVASCULAR: S1, S2 normal. No murmurs, rubs, or gallops.  ABDOMEN: Soft, non-tender, non-distended. Bowel sounds present. No organomegaly or mass.  EXTREMITIES: No pedal edema, cyanosis, or clubbing.  NEUROLOGIC: Cranial nerves II through XII are intact. Muscle strength 5/5 in all extremities. Sensation intact. Gait not checked.  PSYCHIATRIC: The patient is alert and oriented x 3.  SKIN: No obvious rash, lesion, or ulcer.   DATA REVIEW:   CBC   Recent Labs Lab 12/27/15 0346  WBC 9.0  HGB 12.1*  HCT 34.8*  PLT 237    Chemistries   Recent Labs Lab 12/26/15 1656 12/27/15 0346  NA 127* 133*  K 4.2 3.5  CL 82* 96*  CO2 19* 26  GLUCOSE 563* 238*  BUN 50* 28*  CREATININE 1.43* 0.89  CALCIUM 9.1 8.2*  AST 16  --   ALT 17  --   ALKPHOS 83  --   BILITOT 2.2*  --     Microbiology Results   No results found for this or any previous  visit (from the past 240 hour(s)).  RADIOLOGY:  No results found.   Management plans discussed with the patient, family and they are in agreement.  CODE STATUS:     Code Status Orders        Start     Ordered   12/26/15 2111  Full code  Continuous     12/26/15 2110    Code Status History    Date Active Date Inactive Code Status Order ID Comments User Context   This patient has a current code status but no historical code status.      TOTAL  TIME TAKING CARE OF THIS PATIENT: 40 minutes.    Maanav Kassabian M.D on 12/27/2015 at 10:35 AM  Between 7am to 6pm - Pager - [phone] After 6pm go to [email or web address] - password [email or web address] [facility]  [place] [facility]  Office  [phone]  CC: Primary care physician; Olevia Perches, DO

## 2015-12-27 NOTE — Progress Notes (Addendum)
Inpatient Diabetes Program Recommendations  AACE/ADA: New Consensus Statement on Inpatient Glycemic Control (2015)  Target Ranges:  Prepandial:   less than 140 mg/dL      Peak postprandial:   less than 180 mg/dL (1-2 hours)      Critically ill patients:  140 - 180 mg/dL  Results for Jesse Christian, Jesse Christian (MRN [id number]) as of 12/27/2015 09:05  Ref. Range 12/26/2015 16:57 12/26/2015 17:51 12/26/2015 19:24 12/26/2015 20:59 12/27/2015 08:37  Glucose-Capillary Latest Ref Range: 65 - 99 mg/dL >782 (HH) 956 (HH) 213 (H) 351 (H) 216 (H)   Results for Jesse Christian, Jesse Christian (MRN [id number]) as of 12/27/2015 09:05  Ref. Range 12/26/2015 16:56  Glucose Latest Ref Range: 65 - 99 mg/dL [name])  Results for Jesse Christian, Jesse Christian (MRN [id number]) as of 12/27/2015 09:05  Ref. Range 12/26/2015 16:56  CO2 Latest Ref Range: 22 - 32 mmol/L 19 (L)  Results for Jesse Christian, Jesse Christian (MRN [id number]) as of 12/27/2015 09:05  Ref. Range 12/26/2015 16:56  Anion gap Latest Ref Range: 5 - 15  26 (H)   Review of Glycemic Control  Diabetes history: DM2 Outpatient Diabetes medications: Levemir 30 units BID, Invokana 300 mg QAM, Humalog 5-15 units TID with meals, Metformin XR 1000 mg BID Current orders for Inpatient glycemic control: Levemir 30 units BID, Invokana 300 mg QAM, Novolog 0-9 units TID with meals, Metformin XR 1000 mg BID  Inpatient Diabetes Program Recommendations: Insulin - Basal: Patient received Levemir 30 units at 22:140 on 12/26/15. Fasting glucose 216 mg/dl this morning.  Correction (SSI): Please consider ordering Novolog bedtime correction scale. Insulin - Meal Coverage: Please consider ordering Novolog  4 units TID with meals for meal coverage if patient eats at least 50% of meal. Oral Agents: Please consider discontinuing Invokana and Metformin while inpatient. HgbA1C: Please add on an A1C to blood in lab to evaluate glycemic control over the past 2-3 months. Outpatient Referral: Patient needs to Jesse Christian an Endocrinologist for  assistance with DM management.  NOTE: In reviewing chart, note patient established care with Family Medicine on 06/08/15 and on September 28, 2015 patient cancelled follow up appointment due to not being able to afford co-pay. In reviewing office notes from [facility] MD, noted patient's glucose is no better with addition of Invokana. Patient is only 139 lbs; question if patient has Type 1 diabetes versus Type 2. Recommend patient follow up with Endocrinologist to determine type of diabetes and for assistance with DM management.   Addendum 12/27/15@14 :11-Spoke with patient about diabetes and home regimen for diabetes control. Patient reports that he was diagnosed with diabetes when he was in his 20's and was initially started on oral DM medications but eventually had to be put on insulin. Patient is followed by PCP for diabetes management and currently he takes Levemir 30 units BID, Invokana 300 mg QAM, Humalog 5-15 units TID with meals, and Metformin XR 1000 mg BID as an outpatient for diabetes control. Patient reports that he does not have any issues with affording his co-pays for DM medications. Patient states that he has been taking Invokana for a few months but he has not seen any improvements with glycemic control since starting it. Patient reports that he is taking DM medications as prescribed. Patient states that he checks his glucose 2-3 times per day and that it is usually in the 200's  mg/d.  Discussed glucose and A1C goals. Discussed importance of checking CBGs and maintaining good CBG control to prevent long-term and short-term complications. Explained  how hyperglycemia leads to damage within blood vessels which lead to the common complications seen with uncontrolled diabetes. Stressed to the patient the importance of improving glycemic control to prevent further complications from uncontrolled diabetes. Discussed impact of nutrition, exercise, stress, sickness, and medications on diabetes control.  Encouraged patient to talk with his PCP about being referred to an Endocrinologist for assistance with diabetes management. Patient states that he has a friend that had talked with him about continuous glucose monitoring (CGM). Patient inquired about insulin pumps.  Discussed continuous glucose monitoring as well as insulin pump and explained that usually an Endocrinologist would prescribe CGM and insulin pumps for their patient that were appropriate. Patient verbalized understanding of information discussed and he states that he has no further questions at this time related to diabetes.  Thanks, Orlando PennerMarie Melanie Openshaw, RN, MSN, CDE Diabetes Coordinator Inpatient Diabetes Program [phone] (Team Pager from 8am to 5pm) [phone] ([facility] office) [phone] [facility] office) [phone] [facility] office)

## 2015-12-27 NOTE — Progress Notes (Signed)
[facility]    Jesse GlowBarry Kucharski was admitted to the Hospital on 12/26/2015 and Discharged  12/27/2015 and should be excused from work/school   for 2 days starting 12/26/2015 , may return to work/school without any restrictions.  Call Enedina FinnerSona Emeree Mahler MD, [facility]  [phone] with questions.  Willman Cuny M.D on 12/27/2015,at 11:02 AM

## 2015-12-27 NOTE — Progress Notes (Signed)
[facility] [place] to confirm patient's methadone dose. Per clinic, consent form will need to be signed by patient and faxed to receive information. Awaiting fax of consent forms from clinic.  Jesse Christian, PharmD 12/27/15 11:07 AM

## 2015-12-27 NOTE — Discharge Instructions (Signed)
Continue to check sugars AC/HS

## 2015-12-28 ENCOUNTER — Encounter: Payer: Self-pay | Admitting: Family Medicine

## 2015-12-28 ENCOUNTER — Ambulatory Visit (INDEPENDENT_AMBULATORY_CARE_PROVIDER_SITE_OTHER): Payer: BLUE CROSS/BLUE SHIELD | Admitting: Family Medicine

## 2015-12-28 VITALS — BP 144/89 | HR 96 | Temp 99.0°F | Wt 143.0 lb

## 2015-12-28 DIAGNOSIS — E785 Hyperlipidemia, unspecified: Secondary | ICD-10-CM | POA: Diagnosis not present

## 2015-12-28 DIAGNOSIS — I129 Hypertensive chronic kidney disease with stage 1 through stage 4 chronic kidney disease, or unspecified chronic kidney disease: Secondary | ICD-10-CM | POA: Diagnosis not present

## 2015-12-28 DIAGNOSIS — E1365 Other specified diabetes mellitus with hyperglycemia: Secondary | ICD-10-CM | POA: Diagnosis not present

## 2015-12-28 DIAGNOSIS — Z794 Long term (current) use of insulin: Secondary | ICD-10-CM | POA: Diagnosis not present

## 2015-12-28 DIAGNOSIS — R112 Nausea with vomiting, unspecified: Secondary | ICD-10-CM

## 2015-12-28 LAB — LIPID PANEL PICCOLO, WAIVED
CHOL/HDL RATIO PICCOLO,WAIVE: 4.5 mg/dL
Cholesterol Piccolo, Waived: 219 mg/dL — ABNORMAL HIGH (ref <200)
HDL Chol Piccolo, Waived: 49 mg/dL — ABNORMAL LOW (ref >59)
LDL Chol Calc Piccolo Waived: 112 mg/dL — ABNORMAL HIGH (ref <100)
TRIGLYCERIDES PICCOLO,WAIVED: 287 mg/dL — AB (ref <150)
VLDL CHOL CALC PICCOLO,WAIVE: 57 mg/dL — AB (ref <30)

## 2015-12-28 LAB — HEMOGLOBIN A1C: Hemoglobin A1C: >140

## 2015-12-28 LAB — BAYER DCA HB A1C WAIVED

## 2015-12-28 MED ORDER — PREGABALIN 50 MG PO CAPS
50.0000 mg | ORAL_CAPSULE | Freq: Three times a day (TID) | ORAL | 0 refills | Status: DC
Start: 2015-12-28 — End: 2016-02-04

## 2015-12-28 MED ORDER — ONDANSETRON 4 MG PO TBDP: 4.0000 mg | ORAL_TABLET | Freq: Three times a day (TID) | ORAL | 0 refills | Status: DC | PRN

## 2015-12-28 MED ORDER — PREGABALIN 100 MG PO CAPS: 100.0000 mg | ORAL_CAPSULE | Freq: Two times a day (BID) | ORAL | 1 refills | Status: DC

## 2015-12-28 NOTE — Progress Notes (Signed)
BP (!) 144/89 (BP Location: Left Arm, Patient Position: Sitting, Cuff Size: Normal)   Pulse 96   Temp 99 F (37.2 C)   Wt 143 lb (64.9 kg)   SpO2 99%   BMI 19.39 kg/m    Subjective:    Patient ID: Jesse Christian, male    DOB: 12/18/1970, 45 y.o.   MRN: [id number]  HPI: Jesse PoBarry L Stecklein is a 45 y.o. male  Chief Complaint  Patient presents with  . Hospitalization Follow-up  . Referral    Hospital would like the patient to be seen by Endo to check and make sure that he doesnt have type 1 diabetes  . Abdominal Pain    Patient states that the stomach issues are getting worse, and that something has to be done about it.   HOSPITAL FOLLOW UP- went to the ER having 3-4 days og nausea and vomiting and not taking his insulin because of that. Did not need insulin infusion, sugar came down. Everything resolved and he was discharged home.  Time since discharge: 1 day Hospital/facility: [facility] Diagnosis: Gatroenteritis, Mild DKA, acute renal failure Procedures/tests: labs Consultants: None New medications: None Discharge instructions: Follow up here  Status: better  He notes that every couple of days, he starts throwing up. Starts dry heaving. Usually not diarrhea, this time there was. This past time there was belly pain, but not usually belly pain  DIABETES Hypoglycemic episodes:no Polydipsia/polyuria: yes Visual disturbance: yes Chest pain: no Paresthesias: yes- would like to start lyrica, was on it before and it helped Glucose Monitoring: yes  Accucheck frequency: TID Taking Insulin?: yes Blood Pressure Monitoring: a few times a month Retinal Examination: Up to Date Foot Exam: Up to Date Diabetic Education: Completed Pneumovax: Up to Date Influenza: Not Up to Date Aspirin: yes  HYPERTENSION / HYPERLIPIDEMIA- has not been taking his medications, not sure why Satisfied with current treatment? yes Duration of hypertension: chronic BP monitoring frequency: not checking BP  medication side effects: no Duration of hyperlipidemia: chronic Cholesterol medication side effects: no Cholesterol supplements: none Past cholesterol medications: lipitor Medication compliance: excellent compliance Aspirin: yes Recent stressors: yes Recurrent headaches: no Visual changes: no Palpitations: no Dyspnea: no Chest pain: no Lower extremity edema: no Dizzy/lightheaded: yes   Relevant past medical, surgical, family and social history reviewed and updated as indicated. Interim medical history since our last visit reviewed. Allergies and medications reviewed and updated.  Review of Systems  Constitutional: Negative.   Respiratory: Negative.   Cardiovascular: Negative.   Gastrointestinal: Positive for nausea and vomiting. Negative for abdominal distention, abdominal pain, anal bleeding, blood in stool, constipation, diarrhea and rectal pain.  Psychiatric/Behavioral: Negative.     Per HPI unless specifically indicated above     Objective:    BP (!) 144/89 (BP Location: Left Arm, Patient Position: Sitting, Cuff Size: Normal)   Pulse 96   Temp 99 F (37.2 C)   Wt 143 lb (64.9 kg)   SpO2 99%   BMI 19.39 kg/m   Wt Readings from Last 3 Encounters:  12/28/15 143 lb (64.9 kg)  12/26/15 139 lb 14.4 oz (63.5 kg)  07/28/15 148 lb (67.1 kg)    Physical Exam  Constitutional: He is oriented to person, place, and time. He appears well-developed and well-nourished. No distress.  HENT:  Head: Normocephalic and atraumatic.  Right Ear: Hearing normal.  Left Ear: Hearing normal.  Nose: Nose normal.  Eyes: Conjunctivae and lids are normal. Right eye exhibits no discharge. Left  eye exhibits no discharge. No scleral icterus.  Cardiovascular: Normal rate, regular rhythm, normal heart sounds and intact distal pulses.  Exam reveals no gallop and no friction rub.   No murmur heard. Pulmonary/Chest: Effort normal and breath sounds normal. No respiratory distress. He has no wheezes.  He has no rales. He exhibits no tenderness.  Musculoskeletal: Normal range of motion.  Neurological: He is alert and oriented to person, place, and time.  Skin: Skin is warm, dry and intact. No rash noted. No erythema. No pallor.  Psychiatric: He has a normal mood and affect. His speech is normal and behavior is normal. Judgment and thought content normal. Cognition and memory are normal.  Nursing note and vitals reviewed.   Results for orders placed or performed in visit on 12/28/15  Lipid Panel Piccolo, [employer]  Result Value Ref Range   Cholesterol Piccolo, Waived 219 (H) <200 mg/dL   HDL Chol Piccolo, Waived 49 (L) >59 mg/dL   Triglycerides Piccolo,Waived 287 (H) <150 mg/dL   Chol/HDL Ratio Piccolo,Waive 4.5 mg/dL   LDL Chol Calc Piccolo Waived 112 (H) <100 mg/dL   VLDL Chol Calc Piccolo,Waive 57 (H) <30 mg/dL  Bayer DCA Hb W0J Waived  Result Value Ref Range   Bayer DCA Hb A1c Waived >14.0 (H) <7.0 %  Hemoglobin A1c  Result Value Ref Range   Hemoglobin A1C >140.0       Assessment & Plan:   Problem List Items Addressed This Visit      Endocrine   Diabetes mellitus with hyperglycemia, with long-term current use of insulin (HCC) - Primary    Not under good control. ?Now gastroparesis. Has been lost to follow up for 5 months. Will get him into endocrinology. Referral generated today.      Relevant Orders   Comprehensive metabolic panel   Bayer DCA Hb W1X Waived (Completed)   CBC with Differential/Platelet   Ambulatory referral to Endocrinology     Genitourinary   Benign hypertensive renal disease    Not under good control. Not taking his medicine. Will start it back up and recheck 3-4 weeks.       Relevant Orders   Comprehensive metabolic panel   CBC with Differential/Platelet     Other   Hyperlipidemia    Not under good control. Not taking his medicine. Will restart it and recheck in 3-4 weeks.       Relevant Orders   Lipid Panel Piccolo, Waived (Completed)    Comprehensive metabolic panel    Other Visit Diagnoses    Non-intractable vomiting with nausea, vomiting of unspecified type       ?Gastroparesis. Will get into endo. Will start zofran for nausea. Recheck 3-4 weeks.       Follow up plan: Return in about 4 weeks (around 01/25/2016).

## 2015-12-28 NOTE — Assessment & Plan Note (Signed)
Not under good control. Not taking his medicine. Will restart it and recheck in 3-4 weeks.

## 2015-12-28 NOTE — Assessment & Plan Note (Signed)
Not under good control. ?Now gastroparesis. Has been lost to follow up for 5 months. Will get him into endocrinology. Referral generated today.

## 2015-12-28 NOTE — Patient Instructions (Addendum)
Gastroparesis °Gastroparesis, also called delayed gastric emptying, is a condition in which food takes longer than normal to empty from the stomach. The condition is usually long-lasting (chronic). °CAUSES °This condition may be caused by: °· An endocrine disorder, such as hypothyroidism or diabetes. Diabetes is the most common cause of this condition. °· A nervous system disease, such as Parkinson disease or multiple sclerosis. °· Cancer, infection, or surgery of the stomach or vagus nerve. °· A connective tissue disorder, such as scleroderma. °· Certain medicines. °In most cases, the cause is not known. °RISK FACTORS °This condition is more likely to develop in: °· People with certain disorders, including endocrine disorders, eating disorders, amyloidosis, and scleroderma. °· People with certain diseases, including Parkinson disease or multiple sclerosis. °· People with cancer or infection of the stomach or vagus nerve. °· People who have had surgery on the stomach or vagus nerve. °· People who take certain medicines. °· Women. °SYMPTOMS °Symptoms of this condition include: °· An early feeling of fullness when eating. °· Nausea. °· Weight loss. °· Vomiting. °· Heartburn. °· Abdominal bloating. °· Inconsistent blood glucose levels. °· Lack of appetite. °· Acid from the stomach coming up into the esophagus (gastroesophageal reflux). °· Spasms of the stomach. °Symptoms may come and go. °DIAGNOSIS °This condition is diagnosed with tests, such as: °· Tests that check how long it takes food to move through the stomach and intestines. These tests include: °¨ Upper gastrointestinal (GI) series. In this test, X-rays of the intestines are taken after you drink a liquid. The liquid makes the intestines show up better on the X-rays. °¨ Gastric emptying scintigraphy. In this test, scans are taken after you eat food that contains a small amount of radioactive material. °¨ Wireless capsule GI monitoring system. This test  involves swallowing a capsule that records information about movement through the stomach. °· Gastric manometry. This test measures electrical and muscular activity in the stomach. It is done with a thin tube that is passed down the throat and into the stomach. °· Endoscopy. This test checks for abnormalities in the lining of the stomach. It is done with a long, thin tube that is passed down the throat and into the stomach. °· An ultrasound. This test can help rule out gallbladder disease or pancreatitis as a cause of your symptoms. It uses sound waves to take pictures of the inside of your body. °TREATMENT °There is no cure for gastroparesis. This condition may be managed with: °· Treatment of the underlying condition causing the gastroparesis. °· Lifestyle changes, including exercise and dietary changes. Dietary changes can include: °¨ Changes in what and when you eat. °¨ Eating smaller meals more often. °¨ Eating low-fat foods. °¨ Eating low-fiber forms of high-fiber foods, such as cooked vegetables instead of raw vegetables. °¨ Having liquid foods in place of solid foods. Liquid foods are easier to digest. °· Medicines. These may be given to control nausea and vomiting and to stimulate stomach muscles. °· Getting food through a feeding tube. This may be done in severe cases. °· A gastric neurostimulator. This is a device that is inserted into the body with surgery. It helps improve stomach emptying and control nausea and vomiting. °HOME CARE INSTRUCTIONS °· Follow your health care provider's instructions about exercise and diet. °· Take medicines only as directed by your health care provider. °SEEK MEDICAL CARE IF: °· Your symptoms do not improve with treatment. °· You have new symptoms. °SEEK IMMEDIATE MEDICAL CARE IF: °· You have   severe abdominal pain that does not improve with treatment. °· You have nausea that does not go away. °· You cannot keep fluids down. °  °This information is not intended to replace  advice given to you by your health care provider. Make sure you discuss any questions you have with your health care provider. °  °Document Released: 03/27/2005 Document Revised: 08/11/2014 Document Reviewed: 03/23/2014 °Elsevier Interactive Patient Education ©2016 [employer] ° °

## 2015-12-28 NOTE — Assessment & Plan Note (Signed)
Not under good control. Not taking his medicine. Will start it back up and recheck 3-4 weeks.

## 2015-12-29 LAB — COMPREHENSIVE METABOLIC PANEL
A/G RATIO: 1.4 (ref 1.2–2.2)
ALK PHOS: 73 IU/L (ref 39–117)
ALT: 9 IU/L (ref 0–44)
AST: 13 IU/L (ref 0–40)
Albumin: 3.7 g/dL (ref 3.5–5.5)
BUN/Creatinine Ratio: 14 (ref 9–20)
BUN: 12 mg/dL (ref 6–24)
Bilirubin Total: 0.6 mg/dL (ref 0.0–1.2)
CO2: 27 mmol/L (ref 18–29)
Calcium: 9.1 mg/dL (ref 8.7–10.2)
Chloride: 93 mmol/L — ABNORMAL LOW (ref 96–106)
Creatinine, Ser: 0.83 mg/dL (ref 0.76–1.27)
GFR calc Af Amer: 125 mL/min/{1.73_m2} (ref >59)
GFR calc non Af Amer: 108 mL/min/{1.73_m2} (ref >59)
GLOBULIN, TOTAL: 2.6 g/dL (ref 1.5–4.5)
Glucose: 54 mg/dL — ABNORMAL LOW (ref 65–99)
POTASSIUM: 3.2 mmol/L — AB (ref 3.5–5.2)
SODIUM: 136 mmol/L (ref 134–144)
Total Protein: 6.3 g/dL (ref 6.0–8.5)

## 2015-12-29 LAB — CBC WITH DIFFERENTIAL/PLATELET
BASOS ABS: 0 10*3/uL (ref 0.0–0.2)
Basos: 0 %
EOS (ABSOLUTE): 0.1 10*3/uL (ref 0.0–0.4)
Eos: 1 %
Hematocrit: 36.3 % — ABNORMAL LOW (ref 37.5–51.0)
Hemoglobin: 12.1 g/dL — ABNORMAL LOW (ref 12.6–17.7)
IMMATURE GRANULOCYTES: 0 %
Immature Grans (Abs): 0 10*3/uL (ref 0.0–0.1)
Lymphocytes Absolute: 4.4 10*3/uL — ABNORMAL HIGH (ref 0.7–3.1)
Lymphs: 35 %
MCH: 29.4 pg (ref 26.6–33.0)
MCHC: 33.3 g/dL (ref 31.5–35.7)
MCV: 88 fL (ref 79–97)
MONOS ABS: 0.8 10*3/uL (ref 0.1–0.9)
Monocytes: 7 %
NEUTROS PCT: 57 %
Neutrophils Absolute: 7.4 10*3/uL — ABNORMAL HIGH (ref 1.4–7.0)
PLATELETS: 314 10*3/uL (ref 150–379)
RBC: 4.11 x10E6/uL — AB (ref 4.14–5.80)
RDW: 13.2 % (ref 12.3–15.4)
WBC: 12.7 10*3/uL — AB (ref 3.4–10.8)

## 2016-01-03 ENCOUNTER — Inpatient Hospital Stay: Payer: BLUE CROSS/BLUE SHIELD | Admitting: Family Medicine

## 2016-01-07 ENCOUNTER — Encounter: Payer: Self-pay | Admitting: Family Medicine

## 2016-01-07 ENCOUNTER — Ambulatory Visit (INDEPENDENT_AMBULATORY_CARE_PROVIDER_SITE_OTHER): Payer: BLUE CROSS/BLUE SHIELD | Admitting: Family Medicine

## 2016-01-07 ENCOUNTER — Telehealth: Payer: Self-pay | Admitting: Family Medicine

## 2016-01-07 VITALS — BP 142/87 | HR 100 | Temp 98.9°F | Wt 173.0 lb

## 2016-01-07 DIAGNOSIS — I129 Hypertensive chronic kidney disease with stage 1 through stage 4 chronic kidney disease, or unspecified chronic kidney disease: Secondary | ICD-10-CM | POA: Diagnosis not present

## 2016-01-07 DIAGNOSIS — E785 Hyperlipidemia, unspecified: Secondary | ICD-10-CM | POA: Diagnosis not present

## 2016-01-07 DIAGNOSIS — R600 Localized edema: Secondary | ICD-10-CM

## 2016-01-07 MED ORDER — ATORVASTATIN CALCIUM 40 MG PO TABS: 40.0000 mg | ORAL_TABLET | Freq: Every day | ORAL | 6 refills | Status: DC

## 2016-01-07 MED ORDER — LISINOPRIL 10 MG PO TABS: 10.0000 mg | ORAL_TABLET | Freq: Every day | ORAL | 3 refills | Status: DC

## 2016-01-07 NOTE — Telephone Encounter (Signed)
Put patient on Jesse Christian's schedule at 1:15, called with no answer LVM that he needs to be seen.

## 2016-01-07 NOTE — Assessment & Plan Note (Signed)
Refilled lisinopril

## 2016-01-07 NOTE — Assessment & Plan Note (Signed)
Refilled atorvastatin

## 2016-01-07 NOTE — Telephone Encounter (Signed)
Patient confirmed, will come in at 1:15

## 2016-01-07 NOTE — Progress Notes (Signed)
BP (!) 142/87   Pulse 100   Temp 98.9 F (37.2 C)   Wt 173 lb (78.5 kg)   SpO2 95%   BMI 23.46 kg/m    Subjective:    Patient ID: Jesse Christian, male    DOB: 1970/09/06, 45 y.o.   MRN: [id number]  HPI: Jesse PoBarry L Fredell is a 45 y.o. male  Chief Complaint  Patient presents with  . Foot Swelling    noticed this morning both of his feet were very swollen. Was just started on Lyrica and wonders if that's what the cause is.  . Facial Swelling    x 2 days, always has bags under his eyes, but it's been much worse  . Medication Refill    He was also supposed to had gotten refills on his Lisinopril and Atorvastatin but they wern't refilled.   Patient presents with several days of b/l LE edema that he believes to be related to recently starting lyrica for his neuropathy. Has never had this issue before. Denies CP, SOB, calf pain or redness. Has not traveled recently or had any recent surgeries. States the lyrica really helped    Has been out of medications for 2 months. Would like refills for BP and cholesterol. Has done well previously when on them. Recent labs done last week.   Relevant past medical, surgical, family and social history reviewed and updated as indicated. Interim medical history since our last visit reviewed. Allergies and medications reviewed and updated.  Review of Systems  Constitutional: Negative.   HENT: Negative.   Respiratory: Negative.   Cardiovascular: Positive for leg swelling.  Gastrointestinal: Negative.   Genitourinary: Negative.   Musculoskeletal: Negative.   Neurological: Negative.   Psychiatric/Behavioral: Negative.     Per HPI unless specifically indicated above     Objective:    BP (!) 142/87   Pulse 100   Temp 98.9 F (37.2 C)   Wt 173 lb (78.5 kg)   SpO2 95%   BMI 23.46 kg/m   Wt Readings from Last 3 Encounters:  01/07/16 173 lb (78.5 kg)  12/28/15 143 lb (64.9 kg)  12/26/15 139 lb 14.4 oz (63.5 kg)    Physical Exam    Constitutional: He is oriented to person, place, and time. He appears well-developed and well-nourished.  HENT:  Head: Atraumatic.  Eyes: Conjunctivae are normal. No scleral icterus.  Neck: Normal range of motion. Neck supple.  Cardiovascular: Normal rate and normal heart sounds.   Pulmonary/Chest: Effort normal. No respiratory distress.  Musculoskeletal: Normal range of motion. He exhibits edema (2+ edema b/l LEs).  Neurological: He is alert and oriented to person, place, and time.  Skin: Skin is warm and dry.  Psychiatric: He has a normal mood and affect. His behavior is normal.  Nursing note and vitals reviewed.   Results for orders placed or performed in visit on 12/28/15  Lipid Panel Piccolo, [employer]  Result Value Ref Range   Cholesterol Piccolo, Waived 219 (H) <200 mg/dL   HDL Chol Piccolo, Waived 49 (L) >59 mg/dL   Triglycerides Piccolo,Waived 287 (H) <150 mg/dL   Chol/HDL Ratio Piccolo,Waive 4.5 mg/dL   LDL Chol Calc Piccolo Waived 112 (H) <100 mg/dL   VLDL Chol Calc Piccolo,Waive 57 (H) <30 mg/dL  Comprehensive metabolic panel  Result Value Ref Range   Glucose 54 (L) 65 - 99 mg/dL   BUN 12 6 - 24 mg/dL   Creatinine, Ser 4.090.83 0.76 - 1.27 mg/dL   GFR calc  non Af Amer 108 >59 mL/min/1.73   GFR calc Af Amer 125 >59 mL/min/1.73   BUN/Creatinine Ratio 14 9 - 20   Sodium 136 134 - 144 mmol/L   Potassium 3.2 (L) 3.5 - 5.2 mmol/L   Chloride 93 (L) 96 - 106 mmol/L   CO2 27 18 - 29 mmol/L   Calcium 9.1 8.7 - 10.2 mg/dL   Total Protein 6.3 6.0 - 8.5 g/dL   Albumin 3.7 3.5 - 5.5 g/dL   Globulin, Total 2.6 1.5 - 4.5 g/dL   Albumin/Globulin Ratio 1.4 1.2 - 2.2   Bilirubin Total 0.6 0.0 - 1.2 mg/dL   Alkaline Phosphatase 73 39 - 117 IU/L   AST 13 0 - 40 IU/L   ALT 9 0 - 44 IU/L  Bayer DCA Hb A1c Waived  Result Value Ref Range   Bayer DCA Hb A1c Waived >14.0 (H) <7.0 %  CBC with Differential/Platelet  Result Value Ref Range   WBC 12.7 (H) 3.4 - 10.8 x10E3/uL   RBC 4.11 (L)  4.14 - 5.80 x10E6/uL   Hemoglobin 12.1 (L) 12.6 - 17.7 g/dL   Hematocrit 47.8 (L) 29.5 - 51.0 %   MCV 88 79 - 97 fL   MCH 29.4 26.6 - 33.0 pg   MCHC 33.3 31.5 - 35.7 g/dL   RDW 62.1 30.8 - 65.7 %   Platelets 314 150 - 379 x10E3/uL   Neutrophils 57 %   Lymphs 35 %   Monocytes 7 %   Eos 1 %   Basos 0 %   Neutrophils Absolute 7.4 (H) 1.4 - 7.0 x10E3/uL   Lymphocytes Absolute 4.4 (H) 0.7 - 3.1 x10E3/uL   Monocytes Absolute 0.8 0.1 - 0.9 x10E3/uL   EOS (ABSOLUTE) 0.1 0.0 - 0.4 x10E3/uL   Basophils Absolute 0.0 0.0 - 0.2 x10E3/uL   Immature Granulocytes 0 %   Immature Grans (Abs) 0.0 0.0 - 0.1 x10E3/uL  Hemoglobin A1c  Result Value Ref Range   Hemoglobin A1C >140.0       Assessment & Plan:   Problem List Items Addressed This Visit      Genitourinary   Benign hypertensive renal disease    Refilled lisinopril        Other   Hyperlipidemia    Refilled atorvastatin      Relevant Medications   lisinopril (PRINIVIL,ZESTRIL) 10 MG tablet   atorvastatin (LIPITOR) 40 MG tablet    Other Visit Diagnoses    Bilateral leg edema    -  Primary   Trial of d/c lyrica, will recheck at follow up in 3 weeks. Compression stockings, elevation. Strict return precautions given.        Follow up plan: Return for as scheduled.

## 2016-01-07 NOTE — Patient Instructions (Signed)
Follow up as scheduled.  

## 2016-01-25 ENCOUNTER — Ambulatory Visit: Payer: BLUE CROSS/BLUE SHIELD | Admitting: Family Medicine

## 2016-02-04 ENCOUNTER — Ambulatory Visit (INDEPENDENT_AMBULATORY_CARE_PROVIDER_SITE_OTHER): Payer: BLUE CROSS/BLUE SHIELD | Admitting: Family Medicine

## 2016-02-04 ENCOUNTER — Encounter: Payer: Self-pay | Admitting: Family Medicine

## 2016-02-04 VITALS — BP 136/82 | HR 88 | Temp 97.9°F | Wt 156.8 lb

## 2016-02-04 DIAGNOSIS — I129 Hypertensive chronic kidney disease with stage 1 through stage 4 chronic kidney disease, or unspecified chronic kidney disease: Secondary | ICD-10-CM | POA: Diagnosis not present

## 2016-02-04 DIAGNOSIS — Z23 Encounter for immunization: Secondary | ICD-10-CM

## 2016-02-04 DIAGNOSIS — E782 Mixed hyperlipidemia: Secondary | ICD-10-CM

## 2016-02-04 DIAGNOSIS — R600 Localized edema: Secondary | ICD-10-CM | POA: Diagnosis not present

## 2016-02-04 LAB — LIPID PANEL PICCOLO, WAIVED
CHOL/HDL RATIO PICCOLO,WAIVE: 2.7 mg/dL
Cholesterol Piccolo, Waived: 170 mg/dL (ref <200)
HDL Chol Piccolo, Waived: 63 mg/dL (ref >59)
LDL Chol Calc Piccolo Waived: 45 mg/dL (ref <100)
TRIGLYCERIDES PICCOLO,WAIVED: 311 mg/dL — AB (ref <150)
VLDL CHOL CALC PICCOLO,WAIVE: 62 mg/dL — AB (ref <30)

## 2016-02-04 NOTE — Assessment & Plan Note (Signed)
Back under control back on his medicine. Continue current regimen. Continue to monitor.

## 2016-02-04 NOTE — Progress Notes (Signed)
BP 136/82   Pulse 88   Temp 97.9 F (36.6 C)   Wt 156 lb 12.8 oz (71.1 kg)   SpO2 99%   BMI 21.27 kg/m    Subjective:    Patient ID: Jesse Christian, male    DOB: 1970-04-16, 45 y.o.   MRN: [id number]  HPI: Jesse Christian is a 45 y.o. male  Chief Complaint  Patient presents with  . Hypertension  . Hyperlipidemia   HYPERTENSION / HYPERLIPIDEMIA Satisfied with current treatment? yes Duration of hypertension: chronic BP monitoring frequency: not checking BP medication side effects: no Duration of hyperlipidemia: chronic Cholesterol medication side effects: no Cholesterol supplements: none Past cholesterol medications: atorvastain (lipitor) Medication compliance: good compliance Aspirin: yes Recent stressors: no Recurrent headaches: no Visual changes: no Palpitations: no Dyspnea: no Chest pain: no Lower extremity edema: no Dizzy/lightheaded: no   Relevant past medical, surgical, family and social history reviewed and updated as indicated. Interim medical history since our last visit reviewed. Allergies and medications reviewed and updated.  Review of Systems  Constitutional: Negative.   Respiratory: Negative.   Cardiovascular: Negative.   Psychiatric/Behavioral: Negative.     Per HPI unless specifically indicated above     Objective:    BP 136/82   Pulse 88   Temp 97.9 F (36.6 C)   Wt 156 lb 12.8 oz (71.1 kg)   SpO2 99%   BMI 21.27 kg/m   Wt Readings from Last 3 Encounters:  02/04/16 156 lb 12.8 oz (71.1 kg)  01/07/16 173 lb (78.5 kg)  12/28/15 143 lb (64.9 kg)    Physical Exam  Constitutional: He is oriented to person, place, and time. He appears well-developed and well-nourished. No distress.  HENT:  Head: Normocephalic and atraumatic.  Right Ear: Hearing normal.  Left Ear: Hearing normal.  Nose: Nose normal.  Eyes: Conjunctivae and lids are normal. Right eye exhibits no discharge. Left eye exhibits no discharge. No scleral icterus.    Cardiovascular: Normal rate, regular rhythm, normal heart sounds and intact distal pulses.  Exam reveals no gallop and no friction rub.   No murmur heard. Pulmonary/Chest: Effort normal and breath sounds normal. No respiratory distress. He has no wheezes. He has no rales. He exhibits no tenderness.  Musculoskeletal: Normal range of motion.  Neurological: He is alert and oriented to person, place, and time.  Skin: Skin is warm, dry and intact. No rash noted. He is not diaphoretic. No erythema. No pallor.  Psychiatric: He has a normal mood and affect. His speech is normal and behavior is normal. Judgment and thought content normal. Cognition and memory are normal.  Nursing note and vitals reviewed.   Results for orders placed or performed in visit on 12/28/15  Lipid Panel Piccolo, [employer]  Result Value Ref Range   Cholesterol Piccolo, Waived 219 (H) <200 mg/dL   HDL Chol Piccolo, Waived 49 (L) >59 mg/dL   Triglycerides Piccolo,Waived 287 (H) <150 mg/dL   Chol/HDL Ratio Piccolo,Waive 4.5 mg/dL   LDL Chol Calc Piccolo Waived 112 (H) <100 mg/dL   VLDL Chol Calc Piccolo,Waive 57 (H) <30 mg/dL  Comprehensive metabolic panel  Result Value Ref Range   Glucose 54 (L) 65 - 99 mg/dL   BUN 12 6 - 24 mg/dL   Creatinine, Ser 4.09 0.76 - 1.27 mg/dL   GFR calc non Af Amer 108 >59 mL/min/1.73   GFR calc Af Amer 125 >59 mL/min/1.73   BUN/Creatinine Ratio 14 9 - 20   Sodium 136  134 - 144 mmol/L   Potassium 3.2 (L) 3.5 - 5.2 mmol/L   Chloride 93 (L) 96 - 106 mmol/L   CO2 27 18 - 29 mmol/L   Calcium 9.1 8.7 - 10.2 mg/dL   Total Protein 6.3 6.0 - 8.5 g/dL   Albumin 3.7 3.5 - 5.5 g/dL   Globulin, Total 2.6 1.5 - 4.5 g/dL   Albumin/Globulin Ratio 1.4 1.2 - 2.2   Bilirubin Total 0.6 0.0 - 1.2 mg/dL   Alkaline Phosphatase 73 39 - 117 IU/L   AST 13 0 - 40 IU/L   ALT 9 0 - 44 IU/L  Bayer DCA Hb A1c Waived  Result Value Ref Range   Bayer DCA Hb A1c Waived >14.0 (H) <7.0 %  CBC with  Differential/Platelet  Result Value Ref Range   WBC 12.7 (H) 3.4 - 10.8 x10E3/uL   RBC 4.11 (L) 4.14 - 5.80 x10E6/uL   Hemoglobin 12.1 (L) 12.6 - 17.7 g/dL   Hematocrit 16.136.3 (L) 09.637.5 - 51.0 %   MCV 88 79 - 97 fL   MCH 29.4 26.6 - 33.0 pg   MCHC 33.3 31.5 - 35.7 g/dL   RDW 04.513.2 40.912.3 - 81.115.4 %   Platelets 314 150 - 379 x10E3/uL   Neutrophils 57 %   Lymphs 35 %   Monocytes 7 %   Eos 1 %   Basos 0 %   Neutrophils Absolute 7.4 (H) 1.4 - 7.0 x10E3/uL   Lymphocytes Absolute 4.4 (H) 0.7 - 3.1 x10E3/uL   Monocytes Absolute 0.8 0.1 - 0.9 x10E3/uL   EOS (ABSOLUTE) 0.1 0.0 - 0.4 x10E3/uL   Basophils Absolute 0.0 0.0 - 0.2 x10E3/uL   Immature Granulocytes 0 %   Immature Grans (Abs) 0.0 0.0 - 0.1 x10E3/uL  Hemoglobin A1c  Result Value Ref Range   Hemoglobin A1C >140.0       Assessment & Plan:   Problem List Items Addressed This Visit      Genitourinary   Benign hypertensive renal disease    Back under control back on his medicine. Continue current regimen. Continue to monitor.       Relevant Orders   Comprehensive metabolic panel     Other   Hyperlipidemia - Primary   Relevant Orders   Comprehensive metabolic panel   Lipid Panel Piccolo, Waived    Other Visit Diagnoses    Bilateral leg edema       Resolved off lyrica. Continue to monitor.    Immunization due       Flu shot given today.   Relevant Orders   Flu Vaccine QUAD 36+ mos PF IM (Fluarix & Fluzone Quad PF) (Completed)       Follow up plan: Return in about 2 months (around 04/05/2016) for DM visit.

## 2016-02-04 NOTE — Patient Instructions (Addendum)
Influenza (Flu) Vaccine (Inactivated or Recombinant):  1. Why get vaccinated? Influenza ("flu") is a contagious disease that spreads around the [place] every year, usually between October and May. Flu is caused by influenza viruses, and is spread mainly by coughing, sneezing, and close contact. Anyone can get flu. Flu strikes suddenly and can last several days. Symptoms vary by age, but can include:  fever/chills  sore throat  muscle aches  fatigue  cough  headache  runny or stuffy nose Flu can also lead to pneumonia and blood infections, and cause diarrhea and seizures in children. If you have a medical condition, such as heart or lung disease, flu can make it worse. Flu is more dangerous for some people. Infants and young children, people 65 years of age and older, pregnant women, and people with certain health conditions or a weakened immune system are at greatest risk. Each year thousands of people in the [place] die from flu, and many more are hospitalized. Flu vaccine can:  keep you from getting flu,  make flu less severe if you do get it, and  keep you from spreading flu to your family and other people. 2. Inactivated and recombinant flu vaccines A dose of flu vaccine is recommended every flu season. Children 6 months through 8 years of age may need two doses during the same flu season. Everyone else needs only one dose each flu season. Some inactivated flu vaccines contain a very small amount of a mercury-based preservative called thimerosal. Studies have not shown thimerosal in vaccines to be harmful, but flu vaccines that do not contain thimerosal are available. There is no live flu virus in flu shots. They cannot cause the flu. There are many flu viruses, and they are always changing. Each year a new flu vaccine is made to protect against three or four viruses that are likely to cause disease in the upcoming flu season. But even when the vaccine doesn't exactly  match these viruses, it may still provide some protection. Flu vaccine cannot prevent:  flu that is caused by a virus not covered by the vaccine, or  illnesses that look like flu but are not. It takes about 2 weeks for protection to develop after vaccination, and protection lasts through the flu season. 3. Some people should not get this vaccine Tell the person who is giving you the vaccine:  If you have any severe, life-threatening allergies. If you ever had a life-threatening allergic reaction after a dose of flu vaccine, or have a severe allergy to any part of this vaccine, you may be advised not to get vaccinated. Most, but not all, types of flu vaccine contain a small amount of egg protein.  If you ever had Guillain-Barre Syndrome (also called GBS). Some people with a history of GBS should not get this vaccine. This should be discussed with your doctor.  If you are not feeling well. It is usually okay to get flu vaccine when you have a mild illness, but you might be asked to come back when you feel better. 4. Risks of a vaccine reaction With any medicine, including vaccines, there is a chance of reactions. These are usually mild and go away on their own, but serious reactions are also possible. Most people who get a flu shot do not have any problems with it. Minor problems following a flu shot include:  soreness, redness, or swelling where the shot was given  hoarseness  sore, red or itchy eyes  cough    fever  aches  headache  itching  fatigue If these problems occur, they usually begin soon after the shot and last 1 or 2 days. More serious problems following a flu shot can include the following:  There may be a small increased risk of Guillain-Barre Syndrome (GBS) after inactivated flu vaccine. This risk has been estimated at 1 or 2 additional cases per million people vaccinated. This is much lower than the risk of severe complications from flu, which can be prevented by  flu vaccine.  Young children who get the flu shot along with pneumococcal vaccine (PCV13) and/or DTaP vaccine at the same time might be slightly more likely to have a seizure caused by fever. Ask your doctor for more information. Tell your doctor if a child who is getting flu vaccine has ever had a seizure. Problems that could happen after any injected vaccine:  People sometimes faint after a medical procedure, including vaccination. Sitting or lying down for about 15 minutes can help prevent fainting, and injuries caused by a fall. Tell your doctor if you feel dizzy, or have vision changes or ringing in the ears.  Some people get severe pain in the shoulder and have difficulty moving the arm where a shot was given. This happens very rarely.  Any medication can cause a severe allergic reaction. Such reactions from a vaccine are very rare, estimated at about 1 in a million doses, and would happen within a few minutes to a few hours after the vaccination. As with any medicine, there is a very remote chance of a vaccine causing a serious injury or death. The safety of vaccines is always being monitored. For more information, visit: www.cdc.gov/vaccinesafety/ 5. What if there is a serious reaction? What should I look for?  Look for anything that concerns you, such as signs of a severe allergic reaction, very high fever, or unusual behavior. Signs of a severe allergic reaction can include hives, swelling of the face and throat, difficulty breathing, a fast heartbeat, dizziness, and weakness. These would start a few minutes to a few hours after the vaccination. What should I do?  If you think it is a severe allergic reaction or other emergency that can't wait, call 9-1-1 and get the person to the nearest hospital. Otherwise, call your doctor.  Reactions should be reported to the Vaccine Adverse Event Reporting System (VAERS). Your doctor should file this report, or you can do it yourself through the  VAERS web site at www.vaers.hhs.gov, or by calling 1-800-822-7967. VAERS does not give medical advice. 6. The National Vaccine Injury Compensation Program The National Vaccine Injury Compensation Program (VICP) is a federal program that was created to compensate people who may have been injured by certain vaccines. Persons who believe they may have been injured by a vaccine can learn about the program and about filing a claim by calling 1-800-338-2382 or visiting the VICP website at www.hrsa.gov/vaccinecompensation. There is a time limit to file a claim for compensation. 7. How can I learn more?  Ask your healthcare provider. He or she can give you the vaccine package insert or suggest other sources of information.  Call your local or state health department.  Contact the Centers for Disease Control and Prevention (CDC):  Call 1-800-232-4636 (1-800-CDC-INFO) or  Visit CDC's website at www.cdc.gov/flu Vaccine Information Statement Inactivated Influenza Vaccine (11/14/2013)   This information is not intended to replace advice given to you by your health care provider. Make sure you discuss any questions you have with   your health care provider.   Document Released: 01/19/2006 Document Revised: 04/17/2014 Document Reviewed: 11/17/2013 Elsevier Interactive Patient Education 2016 Else[employer] Inc.  Gastroparesis Gastroparesis, also called delayed gastric emptying, is a condition in which food takes longer than normal to empty from the stomach. The condition is usually long-lasting (chronic). CAUSES This condition may be caused by:  An endocrine disorder, such as hypothyroidism or diabetes. Diabetes is the most common cause of this condition.  A nervous system disease, such as Parkinson disease or multiple sclerosis.  Cancer, infection, or surgery of the stomach or vagus nerve.  A connective tissue disorder, such as scleroderma.  Certain medicines. In most cases, the cause is not  known. RISK FACTORS This condition is more likely to develop in:  People with certain disorders, including endocrine disorders, eating disorders, amyloidosis, and scleroderma.  People with certain diseases, including Parkinson disease or multiple sclerosis.  People with cancer or infection of the stomach or vagus nerve.  People who have had surgery on the stomach or vagus nerve.  People who take certain medicines.  Women. SYMPTOMS Symptoms of this condition include:  An early feeling of fullness when eating.  Nausea.  Weight loss.  Vomiting.  Heartburn.  Abdominal bloating.  Inconsistent blood glucose levels.  Lack of appetite.  Acid from the stomach coming up into the esophagus (gastroesophageal reflux).  Spasms of the stomach. Symptoms may come and go. DIAGNOSIS This condition is diagnosed with tests, such as:  Tests that check how long it takes food to move through the stomach and intestines. These tests include:  Upper gastrointestinal (GI) series. In this test, X-rays of the intestines are taken after you drink a liquid. The liquid makes the intestines show up better on the X-rays.  Gastric emptying scintigraphy. In this test, scans are taken after you eat food that contains a small amount of radioactive material.  Wireless capsule GI monitoring system. This test involves swallowing a capsule that records information about movement through the stomach.  Gastric manometry. This test measures electrical and muscular activity in the stomach. It is done with a thin tube that is passed down the throat and into the stomach.  Endoscopy. This test checks for abnormalities in the lining of the stomach. It is done with a long, thin tube that is passed down the throat and into the stomach.  An ultrasound. This test can help rule out gallbladder disease or pancreatitis as a cause of your symptoms. It uses sound waves to take pictures of the inside of your  body. TREATMENT There is no cure for gastroparesis. This condition may be managed with:  Treatment of the underlying condition causing the gastroparesis.  Lifestyle changes, including exercise and dietary changes. Dietary changes can include:  Changes in what and when you eat.  Eating smaller meals more often.  Eating low-fat foods.  Eating low-fiber forms of high-fiber foods, such as cooked vegetables instead of raw vegetables.  Having liquid foods in place of solid foods. Liquid foods are easier to digest.  Medicines. These may be given to control nausea and vomiting and to stimulate stomach muscles.  Getting food through a feeding tube. This may be done in severe cases.  A gastric neurostimulator. This is a device that is inserted into the body with surgery. It helps improve stomach emptying and control nausea and vomiting. HOME CARE INSTRUCTIONS  Follow your health care provider's instructions about exercise and diet.  Take medicines only as directed by your health care provider. SEEK  MEDICAL CARE IF:  Your symptoms do not improve with treatment.  You have new symptoms. SEEK IMMEDIATE MEDICAL CARE IF:  You have severe abdominal pain that does not improve with treatment.  You have nausea that does not go away.  You cannot keep fluids down.   This information is not intended to replace advice given to you by your health care provider. Make sure you discuss any questions you have with your health care provider.   Document Released: 03/27/2005 Document Revised: 08/11/2014 Document Reviewed: 03/23/2014 Elsevier Interactive Patient Education [employer]

## 2016-02-05 LAB — COMPREHENSIVE METABOLIC PANEL
ALK PHOS: 122 IU/L — AB (ref 39–117)
ALT: 10 IU/L (ref 0–44)
AST: 13 IU/L (ref 0–40)
Albumin/Globulin Ratio: 1.7 (ref 1.2–2.2)
Albumin: 4.3 g/dL (ref 3.5–5.5)
BILIRUBIN TOTAL: 0.5 mg/dL (ref 0.0–1.2)
BUN/Creatinine Ratio: 18 (ref 9–20)
BUN: 14 mg/dL (ref 6–24)
CHLORIDE: 95 mmol/L — AB (ref 96–106)
CO2: 29 mmol/L (ref 18–29)
CREATININE: 0.8 mg/dL (ref 0.76–1.27)
Calcium: 9.6 mg/dL (ref 8.7–10.2)
GFR calc Af Amer: 126 mL/min/{1.73_m2} (ref >59)
GFR calc non Af Amer: 109 mL/min/{1.73_m2} (ref >59)
GLOBULIN, TOTAL: 2.6 g/dL (ref 1.5–4.5)
Glucose: 184 mg/dL — ABNORMAL HIGH (ref 65–99)
POTASSIUM: 4.7 mmol/L (ref 3.5–5.2)
SODIUM: 136 mmol/L (ref 134–144)
Total Protein: 6.9 g/dL (ref 6.0–8.5)

## 2016-02-24 ENCOUNTER — Other Ambulatory Visit: Payer: Self-pay | Admitting: Family Medicine

## 2016-03-22 ENCOUNTER — Other Ambulatory Visit: Payer: Self-pay | Admitting: Family Medicine

## 2016-03-24 ENCOUNTER — Encounter: Payer: Self-pay | Admitting: Family Medicine

## 2016-03-24 ENCOUNTER — Ambulatory Visit (INDEPENDENT_AMBULATORY_CARE_PROVIDER_SITE_OTHER): Payer: BLUE CROSS/BLUE SHIELD | Admitting: Family Medicine

## 2016-03-24 VITALS — BP 134/87 | HR 93 | Temp 98.1°F | Wt 158.1 lb

## 2016-03-24 DIAGNOSIS — Z794 Long term (current) use of insulin: Secondary | ICD-10-CM | POA: Diagnosis not present

## 2016-03-24 DIAGNOSIS — E1365 Other specified diabetes mellitus with hyperglycemia: Secondary | ICD-10-CM | POA: Diagnosis not present

## 2016-03-24 LAB — BAYER DCA HB A1C WAIVED: HB A1C (BAYER DCA - WAIVED): 10.8 % — ABNORMAL HIGH (ref <7.0)

## 2016-03-24 NOTE — Assessment & Plan Note (Signed)
A1c came back at 10.8- he's not sure why. Continue current regimen. Will get him into endocrinology. Call with any concerns.

## 2016-03-24 NOTE — Progress Notes (Signed)
BP 134/87 (BP Location: Left Arm, Patient Position: Sitting, Cuff Size: Normal)   Pulse 93   Temp 98.1 F (36.7 C)   Wt 158 lb 1.6 oz (71.7 kg)   SpO2 98%   BMI 21.44 kg/m    Subjective:    Patient ID: Jesse Christian, male    DOB: 08/21/1970, [age] y.o.   MRN: [id number]  HPI: Jesse Christian is a 45 y.o. male  Chief Complaint  Patient presents with  . Diabetes   DIABETES Hypoglycemic episodes:no Polydipsia/polyuria: yes Visual disturbance: no Chest pain: no Paresthesias: no Glucose Monitoring: yes  Accucheck frequency: Daily  Fasting glucose: 300+ Taking Insulin?: yes Blood Pressure Monitoring: not checking Retinal Examination: Up to Date Foot Exam: Up to Date Diabetic Education: Completed Pneumovax: Up to Date Influenza: Up to Date Aspirin: no  Relevant past medical, surgical, family and social history reviewed and updated as indicated. Interim medical history since our last visit reviewed. Allergies and medications reviewed and updated.  Review of Systems  Constitutional: Negative.   Respiratory: Negative.   Cardiovascular: Negative.   Psychiatric/Behavioral: Negative.     Per HPI unless specifically indicated above     Objective:    BP 134/87 (BP Location: Left Arm, Patient Position: Sitting, Cuff Size: Normal)   Pulse 93   Temp 98.1 F (36.7 C)   Wt 158 lb 1.6 oz (71.7 kg)   SpO2 98%   BMI 21.44 kg/m   Wt Readings from Last 3 Encounters:  03/24/16 158 lb 1.6 oz (71.7 kg)  02/04/16 156 lb 12.8 oz (71.1 kg)  01/07/16 173 lb (78.5 kg)    Physical Exam  Constitutional: He is oriented to person, place, and time. He appears well-developed and well-nourished. No distress.  HENT:  Head: Normocephalic and atraumatic.  Right Ear: Hearing normal.  Left Ear: Hearing normal.  Nose: Nose normal.  Eyes: Conjunctivae and lids are normal. Right eye exhibits no discharge. Left eye exhibits no discharge. No scleral icterus.  Cardiovascular: Normal rate,  regular rhythm, normal heart sounds and intact distal pulses.  Exam reveals no gallop and no friction rub.   No murmur heard. Pulmonary/Chest: Effort normal and breath sounds normal. No respiratory distress. He has no wheezes. He has no rales. He exhibits no tenderness.  Musculoskeletal: Normal range of motion.  Neurological: He is alert and oriented to person, place, and time.  Skin: Skin is warm, dry and intact. No rash noted. No erythema. No pallor.  Psychiatric: He has a normal mood and affect. His speech is normal and behavior is normal. Judgment and thought content normal. Cognition and memory are normal.  Nursing note and vitals reviewed.   Results for orders placed or performed in visit on 02/04/16  Comprehensive metabolic panel  Result Value Ref Range   Glucose 184 (H) 65 - 99 mg/dL   BUN 14 6 - 24 mg/dL   Creatinine, Ser 4.090.80 0.76 - 1.27 mg/dL   GFR calc non Af Amer 109 >59 mL/min/1.73   GFR calc Af Amer 126 >59 mL/min/1.73   BUN/Creatinine Ratio 18 9 - 20   Sodium 136 134 - 144 mmol/L   Potassium 4.7 3.5 - 5.2 mmol/L   Chloride 95 (L) 96 - 106 mmol/L   CO2 29 18 - 29 mmol/L   Calcium 9.6 8.7 - 10.2 mg/dL   Total Protein 6.9 6.0 - 8.5 g/dL   Albumin 4.3 3.5 - 5.5 g/dL   Globulin, Total 2.6 1.5 - 4.5 g/dL  Albumin/Globulin Ratio 1.7 1.2 - 2.2   Bilirubin Total 0.5 0.0 - 1.2 mg/dL   Alkaline Phosphatase 122 (H) 39 - 117 IU/L   AST 13 0 - 40 IU/L   ALT 10 0 - 44 IU/L  Lipid Panel Piccolo, Waived  Result Value Ref Range   Cholesterol Piccolo, Waived 170 <200 mg/dL   HDL Chol Piccolo, Waived 63 >59 mg/dL   Triglycerides Piccolo,Waived 311 (H) <150 mg/dL   Chol/HDL Ratio Piccolo,Waive 2.7 mg/dL   LDL Chol Calc Piccolo Waived 45 <100 mg/dL   VLDL Chol Calc Piccolo,Waive 62 (H) <30 mg/dL      Assessment & Plan:   Problem List Items Addressed This Visit      Endocrine   Diabetes mellitus with hyperglycemia, with long-term current use of insulin (HCC) - Primary    A1c  came back at 10.8- he's not sure why. Continue current regimen. Will get him into endocrinology. Call with any concerns.       Relevant Orders   Bayer DCA Hb A1c Waived   Ambulatory referral to Endocrinology       Follow up plan: Return in about 3 months (around 06/22/2016) for DM visit.

## 2016-04-05 ENCOUNTER — Ambulatory Visit: Payer: BLUE CROSS/BLUE SHIELD | Admitting: Family Medicine

## 2016-04-10 DIAGNOSIS — S42301A Unspecified fracture of shaft of humerus, right arm, initial encounter for closed fracture: Secondary | ICD-10-CM

## 2016-04-10 HISTORY — DX: Unspecified fracture of shaft of humerus, right arm, initial encounter for closed fracture: S42.301A

## 2016-04-16 NOTE — Progress Notes (Deleted)
   Subjective:    Patient ID: Jesse Christian, male    DOB: 1971-01-21, 46 y.o.   MRN: [id number]  HPI pt is referred by for diabetes.  Pt states DM was dx'ed in; he has mild if any neuropathy of the lower extremities; he is unaware of any associated chronic complications; he has been on insulin since; pt says his diet and exercise are; he has never had pancreatitis, severe hypoglycemia or DKA.    Review of Systems denies weight loss, blurry vision, headache, chest pain, sob, n/v, urinary frequency, muscle cramps, excessive diaphoresis, memory loss, depression, cold intolerance, rhinorrhea, and easy bruising     Objective:   Physical Exam VS: see vs page GEN: no distress HEAD: head: no deformity eyes: no periorbital swelling, no proptosis external nose and ears are normal mouth: no lesion seen NECK: supple, thyroid is not enlarged CHEST WALL: no deformity LUNGS: clear to auscultation CV: reg rate and rhythm, no murmur ABD: abdomen is soft, nontender.  no hepatosplenomegaly.  not distended.  no hernia MUSCULOSKELETAL: muscle bulk and strength are grossly normal.  no obvious joint swelling.  gait is normal and steady EXTEMITIES: no deformity.  no ulcer on the feet.  feet are of normal color and temp.  no edema PULSES: dorsalis pedis intact bilat.  no carotid bruit NEURO:  cn 2-12 grossly intact.   readily moves all 4's.  sensation is intact to touch on the feet SKIN:  Normal texture and temperature.  No rash or suspicious lesion is visible.   NODES:  None palpable at the neck PSYCH: alert, well-oriented.  Does not appear anxious nor depressed.       Assessment & Plan:

## 2016-04-20 ENCOUNTER — Ambulatory Visit (INDEPENDENT_AMBULATORY_CARE_PROVIDER_SITE_OTHER): Payer: BLUE CROSS/BLUE SHIELD | Admitting: Endocrinology

## 2016-04-20 DIAGNOSIS — Z0289 Encounter for other administrative examinations: Secondary | ICD-10-CM

## 2016-06-12 ENCOUNTER — Other Ambulatory Visit: Payer: Self-pay | Admitting: Family Medicine

## 2016-06-23 ENCOUNTER — Other Ambulatory Visit: Payer: Self-pay

## 2016-06-23 ENCOUNTER — Ambulatory Visit: Payer: BLUE CROSS/BLUE SHIELD | Admitting: Family Medicine

## 2016-06-23 ENCOUNTER — Telehealth: Payer: Self-pay

## 2016-06-23 DIAGNOSIS — B182 Chronic viral hepatitis C: Secondary | ICD-10-CM

## 2016-06-23 NOTE — Telephone Encounter (Signed)
Pt notified he is due to have his viral load and liver enzymes checked. Order placed. [facility].

## 2016-06-23 NOTE — Telephone Encounter (Signed)
-----   Message from Rayann HemanGinger Areon Cocuzza, CMA sent at 10/25/2015 11:43 AM EDT ----- Pt needs 6 months repeat viral load and LFT's.

## 2016-06-26 ENCOUNTER — Ambulatory Visit (INDEPENDENT_AMBULATORY_CARE_PROVIDER_SITE_OTHER): Payer: BLUE CROSS/BLUE SHIELD | Admitting: Family Medicine

## 2016-06-26 ENCOUNTER — Encounter: Payer: Self-pay | Admitting: Family Medicine

## 2016-06-26 VITALS — BP 134/92 | HR 86 | Temp 98.6°F | Resp 17 | Ht 72.0 in | Wt 161.0 lb

## 2016-06-26 DIAGNOSIS — E782 Mixed hyperlipidemia: Secondary | ICD-10-CM | POA: Diagnosis not present

## 2016-06-26 DIAGNOSIS — I129 Hypertensive chronic kidney disease with stage 1 through stage 4 chronic kidney disease, or unspecified chronic kidney disease: Secondary | ICD-10-CM

## 2016-06-26 DIAGNOSIS — R351 Nocturia: Secondary | ICD-10-CM

## 2016-06-26 DIAGNOSIS — Z794 Long term (current) use of insulin: Secondary | ICD-10-CM

## 2016-06-26 DIAGNOSIS — N401 Enlarged prostate with lower urinary tract symptoms: Secondary | ICD-10-CM | POA: Diagnosis not present

## 2016-06-26 DIAGNOSIS — E1365 Other specified diabetes mellitus with hyperglycemia: Secondary | ICD-10-CM

## 2016-06-26 LAB — MICROALBUMIN, URINE WAIVED
Creatinine, Urine Waived: 100 mg/dL (ref 10–300)
Microalb, Ur Waived: 150 mg/L — ABNORMAL HIGH (ref 0–19)
Microalb/Creat Ratio: >300 mg/g — ABNORMAL HIGH (ref <30)

## 2016-06-26 LAB — BAYER DCA HB A1C WAIVED: HB A1C (BAYER DCA - WAIVED): 12.1 % — ABNORMAL HIGH (ref <7.0)

## 2016-06-26 MED ORDER — INSULIN DETEMIR 100 UNIT/ML ~~LOC~~ SOLN: 30.0000 [IU] | Freq: Two times a day (BID) | SUBCUTANEOUS | 6 refills | Status: DC

## 2016-06-26 MED ORDER — ATORVASTATIN CALCIUM 40 MG PO TABS: 40.0000 mg | ORAL_TABLET | Freq: Every day | ORAL | 1 refills | Status: DC

## 2016-06-26 MED ORDER — METFORMIN HCL ER 500 MG PO TB24: 1000.0000 mg | ORAL_TABLET | Freq: Two times a day (BID) | ORAL | 1 refills | Status: DC

## 2016-06-26 MED ORDER — INSULIN LISPRO 100 UNIT/ML ~~LOC~~ SOLN: 5.0000 [IU] | Freq: Three times a day (TID) | SUBCUTANEOUS | 6 refills | Status: DC

## 2016-06-26 MED ORDER — TAMSULOSIN HCL 0.4 MG PO CAPS: 0.4000 mg | ORAL_CAPSULE | Freq: Every day | ORAL | 3 refills | Status: DC

## 2016-06-26 NOTE — Assessment & Plan Note (Signed)
Will get him started on flomax. Recheck 1 month.

## 2016-06-26 NOTE — Assessment & Plan Note (Signed)
Rechecking levels today. Tolerating medication well. Refills given.

## 2016-06-26 NOTE — Assessment & Plan Note (Signed)
Under poor control with A1c of 12.8. Will get new meter for him as his is having trouble. Insurance issues fixed. Will get him into endocrine. New referral put in today. Discussed with patient that he cannot just no-show, but has to cancel appointments if he is having issues with insurance. He agreed.

## 2016-06-26 NOTE — Progress Notes (Signed)
BP (!) 134/92 (BP Location: Right Arm, Patient Position: Sitting, Cuff Size: Normal)   Pulse 86   Temp 98.6 F (37 C) (Oral)   Resp 17   Ht 6' (1.829 m)   Wt 161 lb (73 kg)   SpO2 100%   BMI 21.84 kg/m    Subjective:    Patient ID: Jesse Christian, male    DOB: Sep 09, 1970, 46 y.o.   MRN: [id number]  HPI: Jesse Christian is a 46 y.o. male  Chief Complaint  Patient presents with  . Diabetes  . Hypertension  . Hyperlipidemia   DIABETES- no showed his appointment in December with Endocrine. No showed appointment in January as well. He states that he didn't have insurance at that time Hypoglycemic episodes:yes- 3 days ago, down to 60 while he was sleeping Polydipsia/polyuria: yes Visual disturbance: yes Chest pain: no Paresthesias: no Glucose Monitoring: yes  Accucheck frequency: BID  Fasting glucose: 220  Evening: 400 Taking Insulin?: yes  Long acting insulin: 30 units levemir BID  Short acting insulin: humalog sliding scale usually 5-10 Blood Pressure Monitoring: not checking Retinal Examination: Not up to Date Foot Exam: Up to Date Diabetic Education: Completed Pneumovax: Up to Date Influenza: Up to Date Aspirin: yes  HYPERTENSION / HYPERLIPIDEMIA Satisfied with current treatment? yes Duration of hypertension: chronic BP monitoring frequency: not checking BP medication side effects: yes Past BP meds: lisinopril Duration of hyperlipidemia: chronic Cholesterol medication side effects: no Cholesterol supplements: none Past cholesterol medications: atorvastain (lipitor) Medication compliance: excellent compliance Aspirin: yes Recent stressors: no Recurrent headaches: no Visual changes: no Palpitations: no Dyspnea: no Chest pain: no Lower extremity edema: no Dizzy/lightheaded: yes   BPH BPH status: uncontrolled Satisfied with current treatment?: not on anything Duration: months Nocturia: 5+ times per night Urinary frequency:no Incomplete voiding:  yes Urgency: no Weak urinary stream: yes Straining to start stream: yes Dysuria: no Onset: gradual Severity: moderate   Relevant past medical, surgical, family and social history reviewed and updated as indicated. Interim medical history since our last visit reviewed. Allergies and medications reviewed and updated.  Review of Systems  Constitutional: Negative.   Respiratory: Negative.   Cardiovascular: Negative.   Genitourinary: Positive for decreased urine volume, difficulty urinating and frequency. Negative for discharge, dysuria, enuresis, flank pain, genital sores, hematuria, penile pain, penile swelling, scrotal swelling, testicular pain and urgency.  Psychiatric/Behavioral: Negative.     Per HPI unless specifically indicated above     Objective:    BP (!) 134/92 (BP Location: Right Arm, Patient Position: Sitting, Cuff Size: Normal)   Pulse 86   Temp 98.6 F (37 C) (Oral)   Resp 17   Ht 6' (1.829 m)   Wt 161 lb (73 kg)   SpO2 100%   BMI 21.84 kg/m   Wt Readings from Last 3 Encounters:  06/26/16 161 lb (73 kg)  03/24/16 158 lb 1.6 oz (71.7 kg)  02/04/16 156 lb 12.8 oz (71.1 kg)    Orthostatic VS for the past 24 hrs:  BP- Lying Pulse- Lying BP- Sitting Pulse- Sitting BP- Standing at 0 minutes Pulse- Standing at 0 minutes  06/26/16 0853 129/86 79 123/84 85 119/82 88    Physical Exam  Constitutional: He is oriented to person, place, and time. He appears well-developed and well-nourished. No distress.  HENT:  Head: Normocephalic and atraumatic.  Right Ear: Hearing normal.  Left Ear: Hearing normal.  Nose: Nose normal.  Eyes: Conjunctivae and lids are normal. Right eye exhibits no  discharge. Left eye exhibits no discharge. No scleral icterus.  Cardiovascular: Normal rate, regular rhythm, normal heart sounds and intact distal pulses.  Exam reveals no gallop and no friction rub.   No murmur heard. Pulmonary/Chest: Effort normal and breath sounds normal. No  respiratory distress. He has no wheezes. He has no rales. He exhibits no tenderness.  Musculoskeletal: Normal range of motion.  Neurological: He is alert and oriented to person, place, and time.  Skin: Skin is warm, dry and intact. No rash noted. He is not diaphoretic. No erythema. No pallor.  Psychiatric: He has a normal mood and affect. His speech is normal and behavior is normal. Judgment and thought content normal. Cognition and memory are normal.  Nursing note and vitals reviewed.   Results for orders placed or performed in visit on 03/24/16  Bayer DCA Hb A1c Waived  Result Value Ref Range   Bayer DCA Hb A1c Waived 10.8 (H) <7.0 %      Assessment & Plan:   Problem List Items Addressed This Visit      Endocrine   Diabetes mellitus with hyperglycemia, with long-term current use of insulin (HCC) - Primary    Under poor control with A1c of 12.8. Will get new meter for him as his is having trouble. Insurance issues fixed. Will get him into endocrine. New referral put in today. Discussed with patient that he cannot just no-show, but has to cancel appointments if he is having issues with insurance. He agreed.       Relevant Medications   metFORMIN (GLUCOPHAGE-XR) 500 MG 24 hr tablet   atorvastatin (LIPITOR) 40 MG tablet   insulin detemir (LEVEMIR) 100 UNIT/ML injection   insulin lispro (HUMALOG) 100 UNIT/ML injection   Other Relevant Orders   Bayer DCA Hb A1c Waived   Comprehensive metabolic panel   Microalbumin, Urine Waived     Genitourinary   Benign hypertensive renal disease    Under good control. Will decrease lisinopril to 5mg  as he is getting dizzy and we are adding flomax. Recheck 1 month.       Relevant Orders   Comprehensive metabolic panel   Lipid Panel w/o Chol/HDL Ratio   Microalbumin, Urine Waived   Benign prostatic hyperplasia with nocturia    Will get him started on flomax. Recheck 1 month.       Relevant Medications   tamsulosin (FLOMAX) 0.4 MG CAPS  capsule     Other   Hyperlipidemia    Rechecking levels today. Tolerating medication well. Refills given.       Relevant Medications   atorvastatin (LIPITOR) 40 MG tablet   Other Relevant Orders   Lipid Panel w/o Chol/HDL Ratio       Follow up plan: Return in about 4 weeks (around 07/24/2016) for Follow up BP and BPH.

## 2016-06-26 NOTE — Patient Instructions (Addendum)
Benign Prostatic Hypertrophy The prostate gland is part of the reproductive system of men. A normal prostate is about the size and shape of a walnut. The prostate gland produces a fluid that is mixed with sperm to make semen. This gland surrounds the urethra and is located in front of the rectum and just below the bladder. The bladder is where urine is stored. The urethra is the tube through which urine passes from the bladder to get out of the body. The prostate grows as a man ages. An enlarged prostate not caused by cancer is called benign prostatic hypertrophy (BPH). An enlarged prostate can press on the urethra. This can make it harder to pass urine. In the early stages of enlargement, the bladder can get by with a narrowed urethra by forcing the urine through. If the problem gets worse, medical or surgical treatment may be required. This condition should be followed by your health care provider. The accumulation of urine in the bladder can cause infection. Back pressure and infection can progress to bladder damage and kidney (renal) failure. If needed, your health care provider may refer you to a specialist in kidney and prostate disease (urologist). What are the causes? BPH is a common health problem in men older than 50 years. This condition is a normal part of aging. However, not all men will develop problems from this condition. If the enlargement grows away from the urethra, then there will not be any compression of the urethra and resistance to urine flow.If the growth is toward the urethra and compresses it, you will experience difficulty urinating. What are the signs or symptoms?  Not able to completely empty your bladder.  Getting up often during the night to urinate.  Need to urinate frequently during the day.  Difficultly starting urine flow.  Decrease in size and strength of your urine stream.  Dribbling after urination.  Pain on urination (more common with  infection).  Inability to pass urine. This needs immediate treatment.  The development of a urinary tract infection. How is this diagnosed? These tests will help your health care provider understand your problem:  A thorough history and physical examination.  A urination history, with the number of times you urinate, the amounts of urine, the strength of the urine stream, and the feeling of emptiness or fullness after urinating.  A postvoid bladder scan that measures any amount of urine that may remain in your bladder after you finish urinating.  Digital rectal exam. In a rectal exam, your health care provider checks your prostate by putting a gloved, lubricated finger into your rectum to feel the back of your prostate gland. This exam detects the size of your gland and abnormal lumps or growths.  Exam of your urine (urinalysis).  Prostate specific antigen (PSA) screening. This is a blood test used to screen for prostate cancer.  Rectal ultrasonography. This test uses sound waves to electronically produce a picture of your prostate gland.  How is this treated? Once symptoms begin, your health care provider will monitor your condition. Of the men with this condition, one third will have symptoms that stabilize, one third will have symptoms that improve, and one third will have symptoms that progress in the first year. Mild symptoms may not need treatment. Simple observation and yearly exams may be all that is required. Medicines and surgery are options for more severe problems. Your health care provider can help you make an informed decision for what is best. Two classes of medicines   are available for relief of prostate symptoms:  Medicines that shrink the prostate. This helps relieve symptoms. These medicines take time to work, and it may be months before any improvement is seen. ? Uncommon side effects include problems with sexual function.  Medicines to relax the muscle of the  prostate. This also relieves the obstruction by reducing any compression on the urethra.This group of medicines work much faster than those that reduce the size of the prostate gland. Usually, one can experience improvement in days to weeks.. ? Side effects can include dizziness, fatigue, lightheadedness, and retrograde ejaculation (diminished volume of ejaculate).  Several types of surgical treatments are available for relief of prostate symptoms:  Transurethral resection of the prostate (TURP)-In this treatment, an instrument is inserted through opening at the tip of the penis. It is used to cut away pieces of the inner core of the prostate. The pieces are removed through the same opening of the penis. This removes the obstruction and helps get rid of the symptoms.  Transurethral incision (TUIP)-In this procedure, small cuts are made in the prostate. This lessens the prostates pressure on the urethra.  Transurethral microwave thermotherapy (TUMT)-This procedure uses microwaves to create heat. The heat destroys and removes a small amount of prostate tissue.  Transurethral needle ablation (TUNA)-This is a procedure that uses radio frequencies to do the same as TUMT.  Interstitial laser coagulation (ILC)-This is a procedure that uses a laser to do the same as TUMT and TUNA.  Transurethral electrovaporization (TUVP)-This is a procedure that uses electrodes to do the same as the procedures listed above.  Contact a health care provider if:  You develop a fever.  There is unexplained back pain.  Symptoms are not helped by medicines prescribed.  You develop side effects from the medicine you are taking.  Your urine becomes very dark or has a bad smell.  Your lower abdomen becomes distended and you have difficulty passing your urine. Get help right away if:  You are suddenly unable to urinate. This is an emergency. You should be seen immediately.  There are large amounts of blood or clots  in the urine.  Your urinary problems become unmanageable.  You develop lightheadedness, severe dizziness, or you feel faint.  You develop moderate to severe low back or flank pain.  You develop chills or fever. This information is not intended to replace advice given to you by your health care provider. Make sure you discuss any questions you have with your health care provider. Document Released: 03/27/2005 Document Revised: 09/08/2015 Document Reviewed: 10/10/2012 Elsevier Interactive Patient Education  2017 [employer]  

## 2016-06-26 NOTE — Assessment & Plan Note (Signed)
Under good control. Will decrease lisinopril to 5mg  as he is getting dizzy and we are adding flomax. Recheck 1 month.

## 2016-06-27 LAB — COMPREHENSIVE METABOLIC PANEL
ALT: 9 IU/L (ref 0–44)
AST: 13 IU/L (ref 0–40)
Albumin/Globulin Ratio: 1.4 (ref 1.2–2.2)
Albumin: 4.3 g/dL (ref 3.5–5.5)
Alkaline Phosphatase: 118 IU/L — ABNORMAL HIGH (ref 39–117)
BUN/Creatinine Ratio: 20 (ref 9–20)
BUN: 21 mg/dL (ref 6–24)
Bilirubin Total: 0.8 mg/dL (ref 0.0–1.2)
CO2: 24 mmol/L (ref 18–29)
Calcium: 9.4 mg/dL (ref 8.7–10.2)
Chloride: 95 mmol/L — ABNORMAL LOW (ref 96–106)
Creatinine, Ser: 1.04 mg/dL (ref 0.76–1.27)
GFR calc Af Amer: 101 mL/min/{1.73_m2} (ref >59)
GFR calc non Af Amer: 88 mL/min/{1.73_m2} (ref >59)
Globulin, Total: 3 g/dL (ref 1.5–4.5)
Glucose: 208 mg/dL — ABNORMAL HIGH (ref 65–99)
Potassium: 4.9 mmol/L (ref 3.5–5.2)
Sodium: 137 mmol/L (ref 134–144)
Total Protein: 7.3 g/dL (ref 6.0–8.5)

## 2016-06-27 LAB — LIPID PANEL W/O CHOL/HDL RATIO
Cholesterol, Total: 230 mg/dL — ABNORMAL HIGH (ref 100–199)
HDL: 55 mg/dL (ref >39)
LDL Calculated: 135 mg/dL — ABNORMAL HIGH (ref 0–99)
Triglycerides: 201 mg/dL — ABNORMAL HIGH (ref 0–149)
VLDL Cholesterol Cal: 40 mg/dL (ref 5–40)

## 2016-07-27 ENCOUNTER — Ambulatory Visit: Payer: Self-pay | Admitting: Family Medicine

## 2016-08-11 ENCOUNTER — Telehealth: Payer: Self-pay | Admitting: Gastroenterology

## 2016-08-11 NOTE — Telephone Encounter (Signed)
Patient LVM, he went to the lab at the hospital and is having problems, please call patient.

## 2016-08-14 ENCOUNTER — Other Ambulatory Visit: Payer: Self-pay

## 2016-08-14 DIAGNOSIS — B182 Chronic viral hepatitis C: Secondary | ICD-10-CM

## 2016-08-14 NOTE — Telephone Encounter (Signed)
Advised pt orders were placed in March and most likely they were deleted as they were not done within a certain time. [facility].

## 2016-08-25 ENCOUNTER — Ambulatory Visit: Payer: Self-pay | Admitting: Family Medicine

## 2016-08-29 ENCOUNTER — Ambulatory Visit: Payer: Self-pay | Admitting: Family Medicine

## 2016-09-08 ENCOUNTER — Other Ambulatory Visit
Admission: RE | Admit: 2016-09-08 | Discharge: 2016-09-08 | Disposition: A | Discharge status: Home / Self Care | Payer: BLUE CROSS/BLUE SHIELD | Source: Ambulatory Visit | Attending: Gastroenterology | Admitting: Gastroenterology

## 2016-09-08 DIAGNOSIS — B182 Chronic viral hepatitis C: Secondary | ICD-10-CM | POA: Insufficient documentation

## 2016-09-08 LAB — HEPATIC FUNCTION PANEL
ALT: 12 U/L — ABNORMAL LOW (ref 17–63)
AST: 15 U/L (ref 15–41)
Albumin: 4.1 g/dL (ref 3.5–5.0)
Alkaline Phosphatase: 121 U/L (ref 38–126)
Bilirubin, Direct: <0.1 mg/dL — ABNORMAL LOW (ref 0.1–0.5)
Total Bilirubin: 0.7 mg/dL (ref 0.3–1.2)
Total Protein: 7.5 g/dL (ref 6.5–8.1)

## 2016-09-13 ENCOUNTER — Telehealth: Payer: Self-pay

## 2016-09-13 NOTE — Telephone Encounter (Signed)
-----   Message from Midge Miniumarren Wohl, MD sent at 09/12/2016 12:32 PM EDT ----- Let the patient know that his liver enzymes are now back to normal.

## 2016-09-13 NOTE — Telephone Encounter (Signed)
Pt notified of lab results. Waiting on viral load result.

## 2016-09-22 ENCOUNTER — Other Ambulatory Visit: Payer: Self-pay | Admitting: Family Medicine

## 2016-09-22 LAB — HCV RT-PCR, QUANT (NON-GRAPH)
HCV Log 10: UNDETERMINED Log10copy/mL
IU log10: UNDETERMINED Log10 IU/mL

## 2016-09-22 MED ORDER — ATORVASTATIN CALCIUM 40 MG PO TABS: 40.0000 mg | ORAL_TABLET | Freq: Every day | ORAL | 1 refills | Status: DC

## 2016-09-22 MED ORDER — LISINOPRIL 10 MG PO TABS: 10.0000 mg | ORAL_TABLET | Freq: Every day | ORAL | 3 refills | Status: DC

## 2016-09-22 NOTE — Telephone Encounter (Signed)
Routing to provider. Patient no showed for last appointment.

## 2016-09-22 NOTE — Telephone Encounter (Signed)
Patient called to request a refill on his medication for atorvastatin 40 MG tablet and Lisinopril 10 MG tablet.  Please Advise.  Thank you

## 2016-09-25 ENCOUNTER — Telehealth: Payer: Self-pay

## 2016-09-25 NOTE — Telephone Encounter (Signed)
Left vm letting pt know lab results.  

## 2016-09-25 NOTE — Telephone Encounter (Signed)
-----   Message from Midge Miniumarren Wohl, MD sent at 09/25/2016  1:06 PM EDT ----- Let the patient know that his hepatitis C level was negative. If this is his 1 year follow-up then he needs no further testing on our part.

## 2016-09-26 ENCOUNTER — Encounter: Payer: Self-pay | Admitting: Emergency Medicine

## 2016-09-26 DIAGNOSIS — E1165 Type 2 diabetes mellitus with hyperglycemia: Secondary | ICD-10-CM | POA: Diagnosis not present

## 2016-09-26 DIAGNOSIS — Z794 Long term (current) use of insulin: Secondary | ICD-10-CM | POA: Insufficient documentation

## 2016-09-26 DIAGNOSIS — R1030 Lower abdominal pain, unspecified: Secondary | ICD-10-CM | POA: Diagnosis not present

## 2016-09-26 DIAGNOSIS — M545 Low back pain: Secondary | ICD-10-CM | POA: Diagnosis present

## 2016-09-26 DIAGNOSIS — E860 Dehydration: Secondary | ICD-10-CM | POA: Diagnosis not present

## 2016-09-26 DIAGNOSIS — F17210 Nicotine dependence, cigarettes, uncomplicated: Secondary | ICD-10-CM | POA: Insufficient documentation

## 2016-09-26 DIAGNOSIS — Z79899 Other long term (current) drug therapy: Secondary | ICD-10-CM | POA: Insufficient documentation

## 2016-09-26 DIAGNOSIS — N200 Calculus of kidney: Secondary | ICD-10-CM | POA: Insufficient documentation

## 2016-09-26 DIAGNOSIS — Z7982 Long term (current) use of aspirin: Secondary | ICD-10-CM | POA: Insufficient documentation

## 2016-09-26 LAB — COMPREHENSIVE METABOLIC PANEL
ALT: 14 U/L — ABNORMAL LOW (ref 17–63)
AST: 18 U/L (ref 15–41)
Albumin: 4.6 g/dL (ref 3.5–5.0)
Alkaline Phosphatase: 111 U/L (ref 38–126)
Anion gap: 12 (ref 5–15)
BUN: 30 mg/dL — ABNORMAL HIGH (ref 6–20)
CO2: 28 mmol/L (ref 22–32)
Calcium: 9.8 mg/dL (ref 8.9–10.3)
Chloride: 93 mmol/L — ABNORMAL LOW (ref 101–111)
Creatinine, Ser: 2.04 mg/dL — ABNORMAL HIGH (ref 0.61–1.24)
GFR calc Af Amer: 44 mL/min — ABNORMAL LOW (ref >60)
GFR calc non Af Amer: 38 mL/min — ABNORMAL LOW (ref >60)
Glucose, Bld: 476 mg/dL — ABNORMAL HIGH (ref 65–99)
Potassium: 4.8 mmol/L (ref 3.5–5.1)
Sodium: 133 mmol/L — ABNORMAL LOW (ref 135–145)
Total Bilirubin: 1 mg/dL (ref 0.3–1.2)
Total Protein: 8.4 g/dL — ABNORMAL HIGH (ref 6.5–8.1)

## 2016-09-26 LAB — URINALYSIS, COMPLETE (UACMP) WITH MICROSCOPIC
Bacteria, UA: NONE SEEN
Bilirubin Urine: NEGATIVE
Glucose, UA: NEGATIVE mg/dL
Ketones, ur: NEGATIVE mg/dL
Leukocytes, UA: NEGATIVE
Nitrite: NEGATIVE
Protein, ur: 100 mg/dL — AB
Specific Gravity, Urine: 1.015 (ref 1.005–1.030)
Squamous Epithelial / LPF: NONE SEEN
pH: 5.5 (ref 5.0–8.0)

## 2016-09-26 LAB — CBC
HCT: 38.6 % — ABNORMAL LOW (ref 40.0–52.0)
Hemoglobin: 13 g/dL (ref 13.0–18.0)
MCH: 29.6 pg (ref 26.0–34.0)
MCHC: 33.6 g/dL (ref 32.0–36.0)
MCV: 88.1 fL (ref 80.0–100.0)
Platelets: 329 10*3/uL (ref 150–440)
RBC: 4.39 MIL/uL — ABNORMAL LOW (ref 4.40–5.90)
RDW: 12.7 % (ref 11.5–14.5)
WBC: 12.3 10*3/uL — ABNORMAL HIGH (ref 3.8–10.6)

## 2016-09-26 NOTE — ED Triage Notes (Signed)
Patient with complaint of lower back and lower abdominal pain times two days. Patient states that he has also had some vomiting with the pain.

## 2016-09-27 ENCOUNTER — Emergency Department
Admission: EM | Admit: 2016-09-27 | Discharge: 2016-09-27 | Disposition: A | Discharge status: Home / Self Care | Payer: BLUE CROSS/BLUE SHIELD | Attending: Emergency Medicine | Admitting: Emergency Medicine

## 2016-09-27 ENCOUNTER — Emergency Department: Payer: BLUE CROSS/BLUE SHIELD

## 2016-09-27 DIAGNOSIS — N200 Calculus of kidney: Secondary | ICD-10-CM

## 2016-09-27 DIAGNOSIS — R739 Hyperglycemia, unspecified: Secondary | ICD-10-CM

## 2016-09-27 DIAGNOSIS — E860 Dehydration: Secondary | ICD-10-CM

## 2016-09-27 LAB — BASIC METABOLIC PANEL
Anion gap: 7 (ref 5–15)
BUN: 33 mg/dL — ABNORMAL HIGH (ref 6–20)
CO2: 25 mmol/L (ref 22–32)
Calcium: 8.5 mg/dL — ABNORMAL LOW (ref 8.9–10.3)
Chloride: 99 mmol/L — ABNORMAL LOW (ref 101–111)
Creatinine, Ser: 1.78 mg/dL — ABNORMAL HIGH (ref 0.61–1.24)
GFR calc Af Amer: 52 mL/min — ABNORMAL LOW (ref >60)
GFR calc non Af Amer: 45 mL/min — ABNORMAL LOW (ref >60)
Glucose, Bld: 459 mg/dL — ABNORMAL HIGH (ref 65–99)
Potassium: 4.9 mmol/L (ref 3.5–5.1)
Sodium: 131 mmol/L — ABNORMAL LOW (ref 135–145)

## 2016-09-27 LAB — GLUCOSE, CAPILLARY
Glucose-Capillary: 413 mg/dL — ABNORMAL HIGH (ref 65–99)
Glucose-Capillary: 216 mg/dL — ABNORMAL HIGH (ref 65–99)

## 2016-09-27 MED ORDER — KETOROLAC TROMETHAMINE 10 MG PO TABS: 10.0000 mg | ORAL_TABLET | Freq: Four times a day (QID) | ORAL | 0 refills | Status: DC | PRN

## 2016-09-27 MED ORDER — INSULIN ASPART 100 UNIT/ML ~~LOC~~ SOLN
8.0000 [IU] | Freq: Once | SUBCUTANEOUS | Status: AC
  Administered 2016-09-27: 8 [IU] via INTRAVENOUS
  Filled 2016-09-27: qty 8

## 2016-09-27 MED ORDER — SODIUM CHLORIDE 0.9 % IV BOLUS (SEPSIS)
1000.0000 mL | Freq: Once | INTRAVENOUS | Status: AC
  Administered 2016-09-27: 1000 mL via INTRAVENOUS

## 2016-09-27 NOTE — ED Notes (Signed)
NAD noted at time of D/C. Pt denies questions or concerns. Pt ambulatory to the lobby at this time.  

## 2016-09-27 NOTE — ED Notes (Signed)
CBG [id number] 

## 2016-09-27 NOTE — ED Provider Notes (Signed)
[facility] Emergency Department Provider Note   First MD Initiated Contact with Patient 09/27/16 [phone]     (approximate)  I have reviewed the triage vital signs and the nursing notes.   HISTORY  Chief Complaint Back Pain and Abdominal Pain    HPI Jesse Christian is a 46 y.o. male with below list of chronic medical conditions including diabetes mellitus presents to the emergency department with lower back pain radiating to groin 2 days. Patient states his current pain score 7 out of 10. Patient admits to one episode of vomiting secondary to the pain. Patient denies any urinary symptoms no hematuria dysuria or urinary frequency or urgency. Patient denies any thoughts patient diarrhea. Patient denies any fever.   Past Medical History:  Diagnosis Date  . Diabetes mellitus without complication (HCC)   . Hyperlipidemia     Patient Active Problem List   Diagnosis Date Noted  . Benign prostatic hyperplasia with nocturia 06/26/2016  . Anxiety disorder due to medical condition 06/22/2015  . Insomnia 06/22/2015  . Hep C w/o coma, chronic (HCC) 06/10/2015  . Benign hypertensive renal disease 06/08/2015  . History of drug abuse in remission 06/08/2015  . Diabetes mellitus with hyperglycemia, with long-term current use of insulin (HCC)   . Hyperlipidemia     History reviewed. No pertinent surgical history.  Prior to Admission medications   Medication Sig Start Date End Date Taking? Authorizing Provider  aspirin 81 MG tablet Take 81 mg by mouth daily.     [provider]  atorvastatin (LIPITOR) 40 MG tablet Take 1 tablet (40 mg total) by mouth daily. 09/22/16   [name], [name], DO  insulin detemir (LEVEMIR) 100 UNIT/ML injection Inject 0.3 mLs (30 Units total) into the skin 2 (two) times daily. 06/26/16   [name], [name], DO  insulin lispro (HUMALOG) 100 UNIT/ML injection Inject 0.05-0.15 mLs (5-15 Units total) into the skin 3 (three) times daily with  meals. 06/26/16   [name], [name], DO  lisinopril (PRINIVIL,ZESTRIL) 10 MG tablet Take 1 tablet (10 mg total) by mouth daily. 09/22/16   [name], [name], DO  metFORMIN (GLUCOPHAGE-XR) 500 MG 24 hr tablet Take 2 tablets (1,000 mg total) by mouth 2 (two) times daily. 06/26/16   [name], [name], DO  methadone (DOLOPHINE) 10 MG tablet Take 130 mg by mouth daily.     [provider]  tamsulosin (FLOMAX) 0.4 MG CAPS capsule Take 1 capsule (0.4 mg total) by mouth daily. 06/26/16   Olevia PerchesJohnson, [name], DO    Allergies Lyrica [pregabalin]  Family History  Problem Relation Age of Onset  . Mental illness Mother   . Heart attack Father   . Heart disease Father   . Mental illness Sister     Social History Social History  Substance Use Topics  . Smoking status: Current Every Day Smoker    Packs/day: 0.50    Types: Cigarettes  . Smokeless tobacco: Never Used  . Alcohol use No    Review of Systems Constitutional: No fever/chills Eyes: No visual changes. ENT: No sore throat. Cardiovascular: Denies chest pain. Respiratory: Denies shortness of breath. Gastrointestinal: Positive for abdominal pain.  vomiting.  No diarrhea.  No constipation. Genitourinary: Negative for dysuria. Musculoskeletal: Negative for neck pain.  Positive  for back pain. Integumentary: Negative for rash. Neurological: Negative for headaches, focal weakness or numbness.   ____________________________________________   PHYSICAL EXAM:  VITAL SIGNS: ED Triage Vitals  Enc Vitals Group  BP 09/26/16 2300 116/75     Pulse Rate 09/26/16 2300 (!) 101     Resp 09/26/16 2300 18     Temp 09/26/16 2300 98 F (36.7 C)     Temp Source 09/26/16 2300 Oral     SpO2 09/26/16 2300 100 %     Weight 09/26/16 2301 72.6 kg (160 lb)     Height 09/26/16 2301 1.829 m (6')     Head Circumference --      Peak Flow --      Pain Score 09/26/16 2300 8     Pain Loc --      Pain Edu? --      Excl. in GC? --      Constitutional: Alert and oriented. Well appearing and in no acute distress. Eyes: Conjunctivae are normal.  Head: Atraumatic. Mouth/Throat: Mucous membranes are moist. Neck: No stridor.   Cardiovascular: Normal rate, regular rhythm. Good peripheral circulation. Grossly normal heart sounds. Respiratory: Normal respiratory effort.  No retractions. Lungs CTAB. Gastrointestinal: Soft and nontender. No distention.  Musculoskeletal: No lower extremity tenderness nor edema. No gross deformities of extremities. Neurologic:  Normal speech and language. No gross focal neurologic deficits are appreciated.  Skin:  Skin is warm, dry and intact. No rash noted. Psychiatric: Mood and affect are normal. Speech and behavior are normal.  ____________________________________________   LABS (all labs ordered are listed, but only abnormal results are displayed)  Labs Reviewed  COMPREHENSIVE METABOLIC PANEL - Abnormal; Notable for the following:       Result Value   Sodium 133 (*)    Chloride 93 (*)    Glucose, Bld 476 (*)    BUN 30 (*)    Creatinine, Ser 2.04 (*)    Total Protein 8.4 (*)    ALT 14 (*)    GFR calc non Af Amer 38 (*)    GFR calc Af Amer 44 (*)    All other components within normal limits  CBC - Abnormal; Notable for the following:    WBC 12.3 (*)    RBC 4.39 (*)    HCT 38.6 (*)    All other components within normal limits  URINALYSIS, COMPLETE (UACMP) WITH MICROSCOPIC - Abnormal; Notable for the following:    Hgb urine dipstick SMALL (*)    Protein, ur 100 (*)    All other components within normal limits    RADIOLOGY I, [place] Cleaster Shiffer, personally viewed and evaluated these images (plain radiographs) as part of my medical decision making, as well as reviewing the written report by the radiologist.  Ct Renal Stone Study  Result Date: 09/27/2016 CLINICAL DATA:  46 year old male with lower back and abdominal pain x2 days. EXAM: CT ABDOMEN AND PELVIS WITHOUT CONTRAST  TECHNIQUE: Multidetector CT imaging of the abdomen and pelvis was performed following the standard protocol without IV contrast. COMPARISON:  Ultrasound dated 06/30/2015 FINDINGS: Evaluation of this exam is limited in the absence of intravenous contrast. Lower chest: The visualized lung bases are clear. Multi vessel coronary vascular calcification noted. There is no intra-abdominal free air or free fluid. Hepatobiliary: No focal liver abnormality is seen. No gallstones, gallbladder wall thickening, or biliary dilatation. Pancreas: Unremarkable. No pancreatic ductal dilatation or surrounding inflammatory changes. Spleen: Normal in size without focal abnormality. Adrenals/Urinary Tract: The adrenal glands are unremarkable . There is a 3 mm nonobstructing right renal inferior pole stone. No hydronephrosis. The left kidney is unremarkable. The visualized ureters and urinary bladder appear unremarkable. Stomach/Bowel: There  is small right stool throughout the colon. No evidence of bowel obstruction or active inflammation. Normal appendix. Vascular/Lymphatic: Mild aortoiliac atherosclerotic disease. Evaluation of the vasculature is limited in the absence of intravenous contrast. No portal venous gas identified. There is no adenopathy. Reproductive: The prostate and seminal vesicles are grossly unremarkable. Other: Mild subcutaneous stranding or contusion of the right anterior pelvic wall. No fluid collection or hematoma. Musculoskeletal: No acute or significant osseous findings. IMPRESSION: 1. A 3 mm nonobstructing right renal inferior pole calculus. No hydronephrosis. 2. No bowel obstruction or active inflammation.  Normal appendix. 3.  Aortic Atherosclerosis (ICD10-I70.0). Electronically Signed   By: Elgie Collard M.D.   On: 09/27/2016 03:50       Procedures   ____________________________________________   INITIAL IMPRESSION / ASSESSMENT AND PLAN / ED COURSE  Pertinent labs & imaging results that were  available during my care of the patient were reviewed by me and considered in my medical decision making (see chart for details).  46 year old male presenting to the emergency department with back and groin pain. CT scan of the abdomen revealed a kidney stone. Patient also noted to be markedly hyperglycemic and a such received 2 L IV normal saline as well as insulin with improvement of hyperglycemia.      ____________________________________________  FINAL CLINICAL IMPRESSION(S) / ED DIAGNOSES  Final diagnoses:  Hyperglycemia  Kidney stone  Dehydration     MEDICATIONS GIVEN DURING THIS VISIT:  Medications  sodium chloride 0.9 % bolus 1,000 mL (1,000 mLs Intravenous New Bag/Given 09/27/16 0319)     NEW OUTPATIENT MEDICATIONS STARTED DURING THIS VISIT:  New Prescriptions   No medications on file    Modified Medications   No medications on file    Discontinued Medications   No medications on file     Note:  This document was prepared using Dragon voice recognition software and may include unintentional dictation errors.    Darci Current, MD 09/28/16 [phone]

## 2016-09-28 ENCOUNTER — Telehealth: Payer: Self-pay

## 2016-09-28 NOTE — Telephone Encounter (Signed)
Left vm letting pt know lab results.  

## 2016-09-28 NOTE — Telephone Encounter (Signed)
-----   Message from Darren Wohl, MD sent at 09/25/2016  1:06 PM EDT ----- Let the patient know that his hepatitis C level was negative. If this is his 1 year follow-up then he needs no further testing on our part. 

## 2016-12-05 ENCOUNTER — Ambulatory Visit: Payer: BLUE CROSS/BLUE SHIELD | Admitting: Urology

## 2016-12-05 ENCOUNTER — Encounter: Payer: Self-pay | Admitting: Urology

## 2016-12-05 VITALS — BP 125/76 | HR 80 | Ht 72.0 in | Wt 168.1 lb

## 2016-12-05 DIAGNOSIS — E291 Testicular hypofunction: Secondary | ICD-10-CM | POA: Diagnosis not present

## 2016-12-05 DIAGNOSIS — M6289 Other specified disorders of muscle: Secondary | ICD-10-CM

## 2016-12-05 DIAGNOSIS — N200 Calculus of kidney: Secondary | ICD-10-CM

## 2016-12-05 LAB — URINALYSIS, COMPLETE
Bilirubin, UA: NEGATIVE
Leukocytes, UA: NEGATIVE
Nitrite, UA: NEGATIVE
Specific Gravity, UA: >1.03 — ABNORMAL HIGH (ref 1.005–1.030)
Urobilinogen, Ur: 0.2 mg/dL (ref 0.2–1.0)
pH, UA: 5 (ref 5.0–7.5)

## 2016-12-05 LAB — MICROSCOPIC EXAMINATION
Bacteria, UA: NONE SEEN
Epithelial Cells (non renal): NONE SEEN /hpf (ref 0–10)
WBC, UA: NONE SEEN /hpf (ref 0–?)

## 2016-12-05 NOTE — Progress Notes (Signed)
12/05/2016 1:45 PM   Jesse Christian 11/27/1970 [id number]  Referring provider: Dorcas Carrow, DO [address] [place], [place] [address]  Chief Complaint  Patient presents with  . Nephrolithiasis    HPI: 46 year old male with a past medical history significant for heroin addiction, currently on methadone, who presents today for further evaluation of obstructive voiding symptoms, a right nonobstructing kidney stone, and decreased libido.  As part of a workup for  low back pain the patient had a CT scan which demonstrated right nonobstructing stone. Currently denies any flank pain. He has never really had any renal colic type symptoms.  The patient also endorses a several year history of intermittent obstructive voiding symptoms. This includes strain to void, weak stream, incomplete bladder emptying, and hesitancy. He also gets up several times at night. During the day time the patient has frequency of every 2 hours. He occasionally has some urinary leakage. He was prescribed tamsulosin, but he is not taking this.  In addition, the patient has his history of constipation. Currently he states the symptoms are recently well controlled and he has at least one soft daily bowel movement.  In addition, the patient endorses a decreased libido and decreased energy level. This been present from the time of his heroin addiction and now his methadone treatment. Currently, the patient has been on methadone for approximately 2 years. He is able to obtain an erection.   PMH: Past Medical History:  Diagnosis Date  . Diabetes mellitus without complication (HCC)   . Hyperlipidemia     Surgical History: History reviewed. No pertinent surgical history.  Home Medications:  Allergies as of 12/05/2016      Reactions   Lyrica [pregabalin] Other (See Comments)   Peripheral edema      Medication List       Accurate as of 12/05/16  1:45 PM. Always use your most recent med list.          aspirin 81  MG tablet Take 81 mg by mouth daily.   atorvastatin 40 MG tablet Commonly known as:  LIPITOR Take 1 tablet (40 mg total) by mouth daily.   insulin detemir 100 UNIT/ML injection Commonly known as:  LEVEMIR Inject 0.3 mLs (30 Units total) into the skin 2 (two) times daily.   lisinopril 10 MG tablet Commonly known as:  PRINIVIL,ZESTRIL Take 1 tablet (10 mg total) by mouth daily.   metFORMIN 500 MG 24 hr tablet Commonly known as:  GLUCOPHAGE-XR Take 2 tablets (1,000 mg total) by mouth 2 (two) times daily.   methadone 10 MG tablet Commonly known as:  DOLOPHINE Take 130 mg by mouth daily.   NOVOLOG 100 UNIT/ML injection Generic drug:  insulin aspart   tamsulosin 0.4 MG Caps capsule Commonly known as:  FLOMAX Take 1 capsule (0.4 mg total) by mouth daily.            Discharge Care Instructions        Start     Ordered   12/05/16 0000  Urinalysis, Complete     12/05/16 1303      Allergies:  Allergies  Allergen Reactions  . Lyrica [Pregabalin] Other (See Comments)    Peripheral edema    Family History: Family History  Problem Relation Age of Onset  . Mental illness Mother   . Heart attack Father   . Heart disease Father   . Mental illness Sister   . Prostate cancer Neg Hx   . Bladder Cancer Neg  Hx   . Kidney cancer Neg Hx     Social History:  reports that he has been smoking Cigarettes.  He has been smoking about 0.50 packs per day. He has never used smokeless tobacco. He reports that he does not drink alcohol or use drugs.  ROS: UROLOGY Frequent Urination?: Yes Hard to postpone urination?: No Burning/pain with urination?: Yes Get up at night to urinate?: No Leakage of urine?: Yes Urine stream starts and stops?: Yes Trouble starting stream?: Yes Do you have to strain to urinate?: Yes Blood in urine?: No Urinary tract infection?: No Sexually transmitted disease?: No Injury to kidneys or bladder?: No Painful intercourse?: No Weak stream?:  Yes Erection problems?: Yes Penile pain?: No  Gastrointestinal Nausea?: No Vomiting?: No Indigestion/heartburn?: No Diarrhea?: No Constipation?: Yes  Constitutional Fever: No Night sweats?: No Weight loss?: No Fatigue?: No  Skin Skin rash/lesions?: No Itching?: No  Eyes Blurred vision?: Yes Double vision?: No  Ears/Nose/Throat Sore throat?: No Sinus problems?: No  Hematologic/Lymphatic Swollen glands?: No Easy bruising?: No  Cardiovascular Leg swelling?: No Chest pain?: No  Respiratory Cough?: No Shortness of breath?: No  Endocrine Excessive thirst?: No  Musculoskeletal Back pain?: No Joint pain?: No  Neurological Headaches?: No Dizziness?: No  Psychologic Depression?: No Anxiety?: No  Physical Exam: BP 125/76 (BP Location: Left Arm, Patient Position: Sitting, Cuff Size: Normal)   Pulse 80   Ht 6' (1.829 m)   Wt 76.2 kg (168 lb 1.6 oz)   BMI 22.80 kg/m   Constitutional:  Alert and oriented, No acute distress. HEENT: [place] AT, moist mucus membranes.  Trachea midline, no masses. Cardiovascular: No clubbing, cyanosis, or edema. Respiratory: Normal respiratory effort, no increased work of breathing. GI: Abdomen is soft, nontender, nondistended, no abdominal masses GU: No CVA tenderness.  Skin: No rashes, bruises or suspicious lesions. Lymph: No cervical or inguinal adenopathy. Neurologic: Grossly intact, no focal deficits, moving all 4 extremities. Psychiatric: Normal mood and affect.  Laboratory Data: Lab Results  Component Value Date   WBC 12.3 (H) 09/26/2016   HGB 13.0 09/26/2016   HCT 38.6 (L) 09/26/2016   MCV 88.1 09/26/2016   PLT 329 09/26/2016    Lab Results  Component Value Date   CREATININE 1.78 (H) 09/27/2016    No results found for: PSA  No results found for: TESTOSTERONE  Lab Results  Component Value Date   HGBA1C >140.0 12/28/2015    Urinalysis    Component Value Date/Time   COLORURINE YELLOW 09/26/2016 2301    APPEARANCEUR CLEAR 09/26/2016 2301   APPEARANCEUR Clear 06/08/2015 1521   LABSPEC 1.015 09/26/2016 2301   LABSPEC 1.022 11/27/2012 0630   PHURINE 5.5 09/26/2016 2301   GLUCOSEU NEGATIVE 09/26/2016 2301   GLUCOSEU 150 mg/dL 40/98/1191 4782   HGBUR SMALL (A) 09/26/2016 2301   BILIRUBINUR NEGATIVE 09/26/2016 2301   BILIRUBINUR Negative 06/08/2015 1521   BILIRUBINUR Negative 11/27/2012 0630   KETONESUR NEGATIVE 09/26/2016 2301   PROTEINUR 100 (A) 09/26/2016 2301   UROBILINOGEN 0.2 10/06/2014 1718   NITRITE NEGATIVE 09/26/2016 2301   LEUKOCYTESUR NEGATIVE 09/26/2016 2301   LEUKOCYTESUR Negative 06/08/2015 1521   LEUKOCYTESUR Negative 11/27/2012 0630    Pertinent Imaging: I've endobiliary the patient's CT scan demonstrating a 3 mm nonobstructing stone in the right kidney.  Assessment & Plan:   #1 kidney stone:  This is nonobstructing, it is 3 mm in size, the patient is asymptomatic. As such, recommended conservative management. We did discussed general stone prevention strategies briefly, in  particular, I suggested increased fluid intake, low salt diet, and better control of his diabetes.  #2 voiding dysfunction: The patient has symptoms of voiding dysfunction and bacteria. I suspect a large part of his obstructive voiding symptoms are a result of increased pelvic floor tone relating back to his drug addiction in the stress associated with recovery. His symptoms date back to that period of time. He may also have a bladder outlet obstruction component secondary to an enlarged prostate, but I feels that he should be treated with pelvic for physical therapy initially to see if we can alleviate his symptoms without initiating a new medication. I made a referral to pelvic floor physical therapy, and the patient will follow-up with [place] in 3 months to reevaluate his symptoms. In addition to pelvic floor physical therapy, I recommended that the patient check his blood glucose levels at night, I suspect  some of his nocturia is associated with hyperglycemia.  #3: Decreased libido: This is unequivocally associated with his narcotic use. I suspect that if he is able to wean off his methadone that his libido we will increase in his testosterone levels will rebound. At this point, I recommend no additional treatment.     No Follow-up on file.  Crist Fat, MD  [facility] [name] [place], [place] [address] [phone]

## 2017-01-22 ENCOUNTER — Other Ambulatory Visit: Payer: Self-pay | Admitting: Family Medicine

## 2017-03-08 ENCOUNTER — Encounter: Payer: Self-pay | Admitting: Urology

## 2017-03-08 ENCOUNTER — Ambulatory Visit: Payer: BLUE CROSS/BLUE SHIELD | Admitting: Urology

## 2017-03-20 ENCOUNTER — Other Ambulatory Visit: Payer: Self-pay

## 2017-03-20 ENCOUNTER — Emergency Department: Payer: BLUE CROSS/BLUE SHIELD

## 2017-03-20 ENCOUNTER — Emergency Department
Admission: EM | Admit: 2017-03-20 | Discharge: 2017-03-20 | Disposition: A | Discharge status: Home / Self Care | Payer: BLUE CROSS/BLUE SHIELD | Attending: Emergency Medicine | Admitting: Emergency Medicine

## 2017-03-20 DIAGNOSIS — S42291A Other displaced fracture of upper end of right humerus, initial encounter for closed fracture: Secondary | ICD-10-CM | POA: Diagnosis not present

## 2017-03-20 DIAGNOSIS — F17210 Nicotine dependence, cigarettes, uncomplicated: Secondary | ICD-10-CM | POA: Insufficient documentation

## 2017-03-20 DIAGNOSIS — Y999 Unspecified external cause status: Secondary | ICD-10-CM | POA: Insufficient documentation

## 2017-03-20 DIAGNOSIS — Y9301 Activity, walking, marching and hiking: Secondary | ICD-10-CM | POA: Insufficient documentation

## 2017-03-20 DIAGNOSIS — E119 Type 2 diabetes mellitus without complications: Secondary | ICD-10-CM | POA: Diagnosis not present

## 2017-03-20 DIAGNOSIS — Z7982 Long term (current) use of aspirin: Secondary | ICD-10-CM | POA: Insufficient documentation

## 2017-03-20 DIAGNOSIS — W000XXA Fall on same level due to ice and snow, initial encounter: Secondary | ICD-10-CM | POA: Insufficient documentation

## 2017-03-20 DIAGNOSIS — Y929 Unspecified place or not applicable: Secondary | ICD-10-CM | POA: Insufficient documentation

## 2017-03-20 DIAGNOSIS — Z794 Long term (current) use of insulin: Secondary | ICD-10-CM | POA: Diagnosis not present

## 2017-03-20 DIAGNOSIS — Z79899 Other long term (current) drug therapy: Secondary | ICD-10-CM | POA: Insufficient documentation

## 2017-03-20 DIAGNOSIS — S4991XA Unspecified injury of right shoulder and upper arm, initial encounter: Secondary | ICD-10-CM | POA: Diagnosis present

## 2017-03-20 NOTE — Discharge Instructions (Signed)
Please call and schedule follow-up appointment with orthopedics. Return to the emergency department for symptoms of concern if you are unable to see her primary care provider or orthopedics.

## 2017-03-20 NOTE — ED Triage Notes (Signed)
Pt states he slipped and fell this morning and is having right shoulder pain.

## 2017-03-20 NOTE — ED Provider Notes (Signed)
[facility] Emergency Department Provider Note ____________________________________________  Time seen: Approximately 11:19 AM  I have reviewed the triage vital signs and the nursing notes.   HISTORY  Chief Complaint Shoulder Injury    HPI Jesse Christian is a 46 y.o. male who presents to the emergency department for treatment and evaluation of right shoulder pain after sustaining a mechanical, non-syncopal fall this morning.  He states that he slipped on the ice and landed directly on the right shoulder.  He states that he has pain with any attempt at movement.  He states he is on methadone and has not taken any other pain medications.  He denies other injuries as well as striking his head or loss of consciousness.  Past Medical History:  Diagnosis Date  . Diabetes mellitus without complication (HCC)   . Hyperlipidemia     Patient Active Problem List   Diagnosis Date Noted  . Benign prostatic hyperplasia with nocturia 06/26/2016  . Anxiety disorder due to medical condition 06/22/2015  . Insomnia 06/22/2015  . Hep C w/o coma, chronic (HCC) 06/10/2015  . Benign hypertensive renal disease 06/08/2015  . History of drug abuse in remission 06/08/2015  . Diabetes mellitus with hyperglycemia, with long-term current use of insulin (HCC)   . Hyperlipidemia     History reviewed. No pertinent surgical history.  Prior to Admission medications   Medication Sig Start Date End Date Taking? Authorizing Provider  aspirin 81 MG tablet Take 81 mg by mouth daily.     [provider]  atorvastatin (LIPITOR) 40 MG tablet Take 1 tablet (40 mg total) by mouth daily. 09/22/16   [name], [name], DO  LEVEMIR 100 UNIT/ML injection INJECT 0.3 MLS (30 UNITS TOTAL) INTO THE SKIN 2 (TWO) TIMES DAILY. 01/22/17   [name], [name], DO  lisinopril (PRINIVIL,ZESTRIL) 10 MG tablet Take 1 tablet (10 mg total) by mouth daily. 09/22/16   [name], [name], DO  metFORMIN  (GLUCOPHAGE-XR) 500 MG 24 hr tablet Take 2 tablets (1,000 mg total) by mouth 2 (two) times daily. 06/26/16   [name], [name], DO  methadone (DOLOPHINE) 10 MG tablet Take 130 mg by mouth daily.     [provider]  NOVOLOG 100 UNIT/ML injection  11/20/16   [provider]  tamsulosin (FLOMAX) 0.4 MG CAPS capsule Take 1 capsule (0.4 mg total) by mouth daily. Patient not taking: Reported on 12/05/2016 06/26/16   Dorcas CarrowJohnson, [name], DO    Allergies Lyrica [pregabalin]  Family History  Problem Relation Age of Onset  . Mental illness Mother   . Heart attack Father   . Heart disease Father   . Mental illness Sister   . Prostate cancer Neg Hx   . Bladder Cancer Neg Hx   . Kidney cancer Neg Hx     Social History Social History   Tobacco Use  . Smoking status: Current Every Day Smoker    Packs/day: 0.50    Types: Cigarettes  . Smokeless tobacco: Never Used  Substance Use Topics  . Alcohol use: No  . Drug use: No    Review of Systems Constitutional: Positive for recent fall.   Cardiovascular: Negative for chest pain. Respiratory: Negative for shortness of breath. Musculoskeletal: Positive for right shoulder pain. Skin: Negative for open wounds or lesions. Neurological: Negative for paresthesias.  ____________________________________________   PHYSICAL EXAM:  VITAL SIGNS: ED Triage Vitals  Enc Vitals Group     BP 03/20/17 1034 (!) 174/105  Pulse Rate 03/20/17 1034 95     Resp 03/20/17 1034 20     Temp 03/20/17 1034 98.3 F (36.8 C)     Temp Source 03/20/17 1034 Oral     SpO2 03/20/17 1034 99 %     Weight 03/20/17 1034 160 lb (72.6 kg)     Height 03/20/17 1034 6' (1.829 m)     Head Circumference --      Peak Flow --      Pain Score 03/20/17 1033 10     Pain Loc --      Pain Edu? --      Excl. in GC? --     Constitutional: Alert and oriented. Well appearing and in no acute distress. Eyes: Conjunctivae are clear without discharge or drainage Head:  Atraumatic Neck: Supple with full range of motion observed Respiratory: Respirations even and unlabored. Musculoskeletal: Focal bony tenderness over the proximal humerus.  No step-off deformity noted of the shoulder.  No tenderness to palpation over the scapula or right thorax.  He has full range of motion of the left upper extremity as well as bilateral lower extremities. Neurologic: Awake, alert, oriented x4. Skin: Intact. Psychiatric: Affect and behavior are appropriate.  ____________________________________________   LABS (all labs ordered are listed, but only abnormal results are displayed)  Labs Reviewed - No data to display ____________________________________________  RADIOLOGY  Angulated right humeral neck fracture per radiology. I, Kem Boroughsari Yuvonne Lanahan, personally viewed and evaluated these images (plain radiographs) as part of my medical decision making, as well as reviewing the written report by the radiologist.  ___________________________________________   PROCEDURES  .Splint Application Date/Time: 03/20/2017 11:26 AM Performed by: Chinita Pesterriplett, Leone Mobley B, FNP Authorized by: Chinita Pesterriplett, Yetta Marceaux B, FNP   Consent:    Consent obtained:  Verbal   Consent given by:  Patient   Risks discussed:  Pain Pre-procedure details:    Sensation:  Normal Procedure details:    Laterality:  Right   Location:  Arm   Splint type: Shoulder Immobilizer.   Supplies:  Prefabricated splint Post-procedure details:    Pain:  Improved   Sensation:  Normal   Patient tolerance of procedure:  Tolerated well, no immediate complications  ___________________________________________   INITIAL IMPRESSION / ASSESSMENT AND PLAN / ED COURSE  Jesse Christian is a 46 y.o. male who presents to the emergency department for evaluation and treatment after a mechanical, non-syncopal fall this morning.  X-ray shows an angulated humeral head fracture.  He was placed in a shoulder immobilizer.  He was advised he will  need to call and schedule an appointment with orthopedics.  Patient states that he is on methadone and cannot receive any additional pain medications.  He also has a history of renal disease and hepatitis C, therefore he is not a candidate for Tylenol or ibuprofen either.  He states that he feels the methadone should control his pain well enough.  Patient was advised to return to the emergency department for symptoms of concern if he is unable to schedule an appointment with his primary care provider or orthopedics.  Medications - No data to display  Pertinent labs & imaging results that were available during my care of the patient were reviewed by me and considered in my medical decision making (see chart for details).  _________________________________________   FINAL CLINICAL IMPRESSION(S) / ED DIAGNOSES  Final diagnoses:  Closed fracture of head of right humerus, initial encounter    ED Discharge Orders    None  If controlled substance prescribed during this visit, 12 month history viewed on the [facility] prior to issuing an initial prescription for Schedule II or III opiod.    Chinita Pester, FNP 03/20/17 1132    Governor Rooks, MD 03/20/17 [phone]

## 2017-03-20 NOTE — ED Notes (Signed)
See triage note  States he slipped and fell this am  Landed on right shoulder  Possible deformity noted increased pain with movement

## 2017-03-27 ENCOUNTER — Ambulatory Visit: Payer: BLUE CROSS/BLUE SHIELD

## 2017-03-27 ENCOUNTER — Other Ambulatory Visit: Payer: Self-pay | Admitting: Orthopedic Surgery

## 2017-03-27 DIAGNOSIS — S42291A Other displaced fracture of upper end of right humerus, initial encounter for closed fracture: Secondary | ICD-10-CM

## 2017-03-28 ENCOUNTER — Ambulatory Visit
Admission: RE | Admit: 2017-03-28 | Discharge: 2017-03-28 | Disposition: A | Discharge status: Home / Self Care | Payer: BLUE CROSS/BLUE SHIELD | Source: Ambulatory Visit | Attending: Orthopedic Surgery | Admitting: Orthopedic Surgery

## 2017-03-28 DIAGNOSIS — S42201A Unspecified fracture of upper end of right humerus, initial encounter for closed fracture: Secondary | ICD-10-CM | POA: Insufficient documentation

## 2017-03-28 DIAGNOSIS — X58XXXA Exposure to other specified factors, initial encounter: Secondary | ICD-10-CM | POA: Insufficient documentation

## 2017-03-28 DIAGNOSIS — S42291A Other displaced fracture of upper end of right humerus, initial encounter for closed fracture: Secondary | ICD-10-CM

## 2017-03-28 DIAGNOSIS — J439 Emphysema, unspecified: Secondary | ICD-10-CM | POA: Diagnosis not present

## 2017-03-28 DIAGNOSIS — I2510 Atherosclerotic heart disease of native coronary artery without angina pectoris: Secondary | ICD-10-CM | POA: Insufficient documentation

## 2017-04-11 ENCOUNTER — Telehealth: Payer: Self-pay

## 2017-04-11 MED ORDER — INSULIN DETEMIR 100 UNIT/ML ~~LOC~~ SOLN: 30.0000 [IU] | Freq: Two times a day (BID) | SUBCUTANEOUS | 1 refills | Status: DC

## 2017-04-11 NOTE — Telephone Encounter (Signed)
Pharmacy is requesting a 90 day supply of Levemir 100U/ ML vial.  Quantity 60 Patient injects 0.163ml into the skin twice a day

## 2017-04-25 ENCOUNTER — Other Ambulatory Visit: Payer: Self-pay | Admitting: Family Medicine

## 2017-06-07 ENCOUNTER — Other Ambulatory Visit: Payer: Self-pay | Admitting: Family Medicine

## 2017-06-22 ENCOUNTER — Ambulatory Visit (INDEPENDENT_AMBULATORY_CARE_PROVIDER_SITE_OTHER): Payer: BLUE CROSS/BLUE SHIELD | Admitting: Family Medicine

## 2017-06-22 ENCOUNTER — Encounter: Payer: Self-pay | Admitting: Family Medicine

## 2017-06-22 VITALS — BP 180/109 | HR 93 | Temp 98.1°F | Ht 72.0 in | Wt 171.3 lb

## 2017-06-22 DIAGNOSIS — E782 Mixed hyperlipidemia: Secondary | ICD-10-CM

## 2017-06-22 DIAGNOSIS — Z794 Long term (current) use of insulin: Secondary | ICD-10-CM | POA: Diagnosis not present

## 2017-06-22 DIAGNOSIS — B182 Chronic viral hepatitis C: Secondary | ICD-10-CM | POA: Diagnosis not present

## 2017-06-22 DIAGNOSIS — I129 Hypertensive chronic kidney disease with stage 1 through stage 4 chronic kidney disease, or unspecified chronic kidney disease: Secondary | ICD-10-CM | POA: Diagnosis not present

## 2017-06-22 DIAGNOSIS — E1365 Other specified diabetes mellitus with hyperglycemia: Secondary | ICD-10-CM | POA: Diagnosis not present

## 2017-06-22 DIAGNOSIS — Z23 Encounter for immunization: Secondary | ICD-10-CM | POA: Diagnosis not present

## 2017-06-22 DIAGNOSIS — R351 Nocturia: Secondary | ICD-10-CM | POA: Diagnosis not present

## 2017-06-22 DIAGNOSIS — F064 Anxiety disorder due to known physiological condition: Secondary | ICD-10-CM

## 2017-06-22 DIAGNOSIS — Z0001 Encounter for general adult medical examination with abnormal findings: Secondary | ICD-10-CM | POA: Diagnosis not present

## 2017-06-22 DIAGNOSIS — B351 Tinea unguium: Secondary | ICD-10-CM

## 2017-06-22 DIAGNOSIS — Z0000 Encounter for general adult medical examination without abnormal findings: Secondary | ICD-10-CM

## 2017-06-22 DIAGNOSIS — N401 Enlarged prostate with lower urinary tract symptoms: Secondary | ICD-10-CM | POA: Diagnosis not present

## 2017-06-22 DIAGNOSIS — L84 Corns and callosities: Secondary | ICD-10-CM

## 2017-06-22 LAB — MICROSCOPIC EXAMINATION: Bacteria, UA: NONE SEEN

## 2017-06-22 LAB — UA/M W/RFLX CULTURE, ROUTINE
Bilirubin, UA: NEGATIVE
Ketones, UA: NEGATIVE
Leukocytes, UA: NEGATIVE
Nitrite, UA: NEGATIVE
Specific Gravity, UA: 1.01 (ref 1.005–1.030)
Urobilinogen, Ur: 0.2 mg/dL (ref 0.2–1.0)
pH, UA: 6.5 (ref 5.0–7.5)

## 2017-06-22 LAB — BAYER DCA HB A1C WAIVED: HB A1C (BAYER DCA - WAIVED): 12.4 % — ABNORMAL HIGH

## 2017-06-22 LAB — MICROALBUMIN, URINE WAIVED
Creatinine, Urine Waived: 50 mg/dL (ref 10–300)
Microalb, Ur Waived: 150 mg/L — ABNORMAL HIGH (ref 0–19)
Microalb/Creat Ratio: >300 mg/g — ABNORMAL HIGH (ref <30)

## 2017-06-22 MED ORDER — LISINOPRIL 20 MG PO TABS: 20.0000 mg | ORAL_TABLET | Freq: Every day | ORAL | 1 refills | Status: DC

## 2017-06-22 MED ORDER — METFORMIN HCL ER 500 MG PO TB24: 1000.0000 mg | ORAL_TABLET | Freq: Two times a day (BID) | ORAL | 1 refills | Status: DC

## 2017-06-22 MED ORDER — ATORVASTATIN CALCIUM 40 MG PO TABS: 40.0000 mg | ORAL_TABLET | Freq: Every day | ORAL | 1 refills | Status: DC

## 2017-06-22 MED ORDER — INSULIN DETEMIR 100 UNIT/ML ~~LOC~~ SOLN: 30.0000 [IU] | Freq: Two times a day (BID) | SUBCUTANEOUS | 1 refills | Status: DC

## 2017-06-22 NOTE — Assessment & Plan Note (Signed)
Not under good control. A1c 12.4. Very labile. Need to get into see endocrine, may benefit from pump, still unclear if he is type 1 or type 2.

## 2017-06-22 NOTE — Assessment & Plan Note (Signed)
Rechecking levels today. Call with any concerns. Will adjust medicine as needed.

## 2017-06-22 NOTE — Assessment & Plan Note (Signed)
Stable. Does not want any medication at this time. Continue to monitor. Call with any concerns.

## 2017-06-22 NOTE — Progress Notes (Signed)
BP (!) 180/109   Pulse 93   Temp 98.1 F (36.7 C) (Oral)   Ht 6' (1.829 m)   Wt 171 lb 4.8 oz (77.7 kg)   SpO2 99%   BMI 23.23 kg/m    Subjective:    Patient ID: Jesse Christian, male    DOB: 08-22-1970, 47 y.o.   MRN: [id number]  HPI: Jesse Christian is a 47 y.o. male presenting on 06/22/2017 for comprehensive medical examination after being lost to follow up for a year. He states that he is only here because his "crazy mama made the appointment". He is not happy to be here today. Current medical complaints include:  DIABETES Hypoglycemic episodes:yes- rarely, 2x a month Polydipsia/polyuria: yes Visual disturbance: yes Chest pain: no Paresthesias: yes Glucose Monitoring: yes  Accucheck frequency: At least daily   Fasting glucose: 180-540 Taking Insulin?: yes  Long acting insulin: levemir  Short acting insulin: novolog Blood Pressure Monitoring: not checking Retinal Examination: Not up to Date Foot Exam: Done today Diabetic Education: Completed Pneumovax: Up to Date Influenza: Given today Aspirin: yes  HYPERTENSION / HYPERLIPIDEMIA Satisfied with current treatment? no- BP very poorly controlled Duration of hypertension: chronic BP monitoring frequency: not checking BP medication side effects: no Past BP meds: lisinopril, flomax Duration of hyperlipidemia: chronic Cholesterol medication side effects: no Cholesterol supplements: none Past cholesterol medications: atorvastatin Medication compliance: good compliance Aspirin: yes Recent stressors: no Recurrent headaches: no Visual changes: yes Palpitations: no Dyspnea: no Chest pain: no Lower extremity edema: no Dizzy/lightheaded: no  HEPATITIS C- has gone through treatment with Dr. Servando SnareWohl already  DEPRESSION Mood status: stable Satisfied with current treatment?: yes Symptom severity: moderate  Side effects: Not on anything Depressed mood: yes Anxious mood: yes Anhedonia: no Significant weight loss or  gain: no Insomnia: yes  Fatigue: yes Feelings of worthlessness or guilt: no Impaired concentration/indecisiveness: no Suicidal ideations: no Hopelessness: no Crying spells: no [facility] 06/22/2017 06/22/2015  Decreased Interest 1 3  Down, Depressed, Hopeless 1 3  PHQ - 2 Score 2 6  Altered sleeping 2 3  Tired, decreased energy 2 2  Change in appetite 2 3  Feeling bad or failure about yourself  1 3  Trouble concentrating 1 3  Moving slowly or fidgety/restless 0 3  Suicidal thoughts 0 3  PHQ-9 Score 10 26  Difficult doing work/chores Somewhat difficult -   GAD 7 : Generalized Anxiety Score 06/22/2017 06/22/2015  Nervous, Anxious, on Edge 2 3  Control/stop worrying 2 3  Worry too much - different things 2 3  Trouble relaxing 2 3  Restless 1 2  Easily annoyed or irritable 2 3  Afraid - awful might happen 1 3  Total GAD 7 Score 12 20  Anxiety Difficulty Very difficult Extremely difficult    He currently lives with: mother Interim Problems from his last visit: no  Depression Screen done today and results listed below:  [facility] 06/22/2017 06/22/2015  Decreased Interest 1 3  Down, Depressed, Hopeless 1 3  PHQ - 2 Score 2 6  Altered sleeping 2 3  Tired, decreased energy 2 2  Change in appetite 2 3  Feeling bad or failure about yourself  1 3  Trouble concentrating 1 3  Moving slowly or fidgety/restless 0 3  Suicidal thoughts 0 3  PHQ-9 Score 10 26  Difficult doing work/chores Somewhat difficult -    Past Medical History:  Past Medical History:  Diagnosis Date  .  Diabetes mellitus without complication (HCC)   . Hyperlipidemia   . Right arm fracture 2018    Surgical History:  History reviewed. No pertinent surgical history.  Medications:  Current Outpatient Medications on File Prior to Visit  Medication Sig  . aspirin 81 MG tablet Take 81 mg by mouth daily.   . methadone (DOLOPHINE) 10 MG tablet Take 130 mg by mouth daily.   Marland Kitchen  NOVOLOG 100 UNIT/ML injection   . tamsulosin (FLOMAX) 0.4 MG CAPS capsule Take 1 capsule (0.4 mg total) by mouth daily.   No current facility-administered medications on file prior to visit.     Allergies:  Allergies  Allergen Reactions  . Lyrica [Pregabalin] Other (See Comments)    Peripheral edema    Social History:  Social History   Socioeconomic History  . Marital status: Single    Spouse name: Not on file  . Number of children: Not on file  . Years of education: Not on file  . Highest education level: Not on file  Social Needs  . Financial resource strain: Not on file  . Food insecurity - worry: Not on file  . Food insecurity - inability: Not on file  . Transportation needs - medical: Not on file  . Transportation needs - non-medical: Not on file  Occupational History  . Not on file  Tobacco Use  . Smoking status: Current Every Day Smoker    Packs/day: 0.50    Types: Cigarettes  . Smokeless tobacco: Never Used  Substance and Sexual Activity  . Alcohol use: No  . Drug use: No  . Sexual activity: Not on file  Other Topics Concern  . Not on file  Social History Narrative  . Not on file   Social History   Tobacco Use  Smoking Status Current Every Day Smoker  . Packs/day: 0.50  . Types: Cigarettes  Smokeless Tobacco Never Used   Social History   Substance and Sexual Activity  Alcohol Use No    Family History:  Family History  Problem Relation Age of Onset  . Mental illness Mother   . Heart attack Father   . Heart disease Father   . Mental illness Sister   . Cancer Maternal Aunt        Lung cancer  . Prostate cancer Neg Hx   . Bladder Cancer Neg Hx   . Kidney cancer Neg Hx     Past medical history, surgical history, medications, allergies, family history and social history reviewed with patient today and changes made to appropriate areas of the chart.   Review of Systems  Constitutional: Negative.   HENT: Negative.   Eyes: Positive for  blurred vision. Negative for double vision, photophobia, pain, discharge and redness.  Respiratory: Negative.   Cardiovascular: Negative.   Gastrointestinal: Positive for constipation and heartburn. Negative for abdominal pain, blood in stool, diarrhea, melena, nausea and vomiting.  Genitourinary: Negative.   Musculoskeletal: Negative.   Skin: Negative.   Neurological: Positive for tingling. Negative for dizziness, tremors, sensory change, speech change, focal weakness, seizures, loss of consciousness and headaches.  Endo/Heme/Allergies: Negative.   Psychiatric/Behavioral: Positive for depression. Negative for hallucinations, memory loss, substance abuse and suicidal ideas. The patient is not nervous/anxious and does not have insomnia.     All other ROS negative except what is listed above and in the HPI.      Objective:    BP (!) 180/109   Pulse 93   Temp 98.1 F (36.7 C) (Oral)  Ht 6' (1.829 m)   Wt 171 lb 4.8 oz (77.7 kg)   SpO2 99%   BMI 23.23 kg/m   Wt Readings from Last 3 Encounters:  06/22/17 171 lb 4.8 oz (77.7 kg)  03/20/17 160 lb (72.6 kg)  12/05/16 168 lb 1.6 oz (76.2 kg)    Physical Exam  Constitutional: He is oriented to person, place, and time. He appears well-developed and well-nourished. No distress.  HENT:  Head: Normocephalic and atraumatic.  Right Ear: Hearing, tympanic membrane, external ear and ear canal normal.  Left Ear: Hearing, tympanic membrane, external ear and ear canal normal.  Nose: Nose normal.  Mouth/Throat: Uvula is midline, oropharynx is clear and moist and mucous membranes are normal. No oropharyngeal exudate.  Eyes: Conjunctivae, EOM and lids are normal. Pupils are equal, round, and reactive to light. Right eye exhibits no discharge. Left eye exhibits no discharge. No scleral icterus.  Neck: Normal range of motion. Neck supple. No JVD present. No tracheal deviation present. No thyromegaly present.  Cardiovascular: Normal rate, regular  rhythm, normal heart sounds and intact distal pulses. Exam reveals no gallop and no friction rub.  No murmur heard. Pulmonary/Chest: Effort normal and breath sounds normal. No stridor. No respiratory distress. He has no wheezes. He has no rales. He exhibits no tenderness.  Abdominal: Soft. Bowel sounds are normal. He exhibits no distension and no mass. There is no tenderness. There is no rebound and no guarding.  Genitourinary:  Genitourinary Comments: Genital exam deferred with shared decision making  Musculoskeletal: Normal range of motion. He exhibits no edema, tenderness or deformity.  Lymphadenopathy:    He has no cervical adenopathy.  Neurological: He is alert and oriented to person, place, and time. He has normal reflexes. He displays normal reflexes. No cranial nerve deficit. He exhibits normal muscle tone. Coordination normal.  Skin: Skin is warm, dry and intact. No rash noted. He is not diaphoretic. No erythema. No pallor.  Psychiatric: He has a normal mood and affect. His speech is normal and behavior is normal. Judgment and thought content normal. Cognition and memory are normal.  Nursing note and vitals reviewed.  Diabetic Foot Exam - Simple   Simple Foot Form Diabetic Foot exam was performed with the following findings:  Yes 06/22/2017  9:39 AM  Visual Inspection See comments:  Yes Sensation Testing See comments:  Yes Pulse Check Posterior Tibialis and Dorsalis pulse intact bilaterally:  Yes Comments Callus on R great toe laterally, cannot feel monofilament on entire bottom of foot      Results for orders placed or performed in visit on 06/22/17  Microscopic Examination  Result Value Ref Range   WBC, UA 0-5 0 - 5 /hpf   RBC, UA 11-30 (A) 0 - 2 /hpf   Epithelial Cells (non renal) 0-10 0 - 10 /hpf   Bacteria, UA None seen None seen/Few  Bayer DCA Hb A1c Waived  Result Value Ref Range   Bayer DCA Hb A1c Waived 12.4 (H) <7.0 %  Microalbumin, Urine Waived  Result Value  Ref Range   Microalb, Ur Waived 150 (H) 0 - 19 mg/L   Creatinine, Urine Waived 50 10 - 300 mg/dL   Microalb/Creat Ratio >300 (H) <30 mg/g  UA/M w/rflx Culture, Routine  Result Value Ref Range   Specific Gravity, UA 1.010 1.005 - 1.030   pH, UA 6.5 5.0 - 7.5   Color, UA Straw Yellow   Appearance Ur Clear Clear   Leukocytes, UA Negative Negative  Protein, UA 1+ (A) Negative/Trace   Glucose, UA 3+ (A) Negative   Ketones, UA Negative Negative   RBC, UA 2+ (A) Negative   Bilirubin, UA Negative Negative   Urobilinogen, Ur 0.2 0.2 - 1.0 mg/dL   Nitrite, UA Negative Negative   Microscopic Examination See below:       Assessment & Plan:   Problem List Items Addressed This Visit      Digestive   Hep C w/o coma, chronic (HCC)    Has been treated. Checking viral load today. Call with any concerns.       Relevant Orders   CBC with Differential/Platelet   Comprehensive metabolic panel   HCV RNA quant   TSH   UA/M w/rflx Culture, Routine (Completed)     Endocrine   Diabetes mellitus with hyperglycemia, with long-term current use of insulin (HCC)    Not under good control. A1c 12.4. Very labile. Need to get into see endocrine, may benefit from pump, still unclear if he is type 1 or type 2.       Relevant Medications   lisinopril (PRINIVIL,ZESTRIL) 20 MG tablet   insulin detemir (LEVEMIR) 100 UNIT/ML injection   atorvastatin (LIPITOR) 40 MG tablet   metFORMIN (GLUCOPHAGE-XR) 500 MG 24 hr tablet   Other Relevant Orders   Bayer DCA Hb A1c Waived (Completed)   CBC with Differential/Platelet   Comprehensive metabolic panel   Microalbumin, Urine Waived (Completed)   TSH   UA/M w/rflx Culture, Routine (Completed)   Ambulatory referral to Podiatry   Ambulatory referral to Endocrinology     Genitourinary   Benign hypertensive renal disease    Not under good control. Will increase lisinopril to 20mg  and recheck 1 month.       Relevant Orders   CBC with Differential/Platelet    Comprehensive metabolic panel   Microalbumin, Urine Waived (Completed)   TSH   UA/M w/rflx Culture, Routine (Completed)   Benign prostatic hyperplasia with nocturia    Stopped the flomax as it wasn't helping. Checking PSA today. Await results.       Relevant Orders   CBC with Differential/Platelet   Comprehensive metabolic panel   PSA   TSH   UA/M w/rflx Culture, Routine (Completed)     Other   Hyperlipidemia    Rechecking levels today. Call with any concerns. Will adjust medicine as needed.       Relevant Medications   lisinopril (PRINIVIL,ZESTRIL) 20 MG tablet   atorvastatin (LIPITOR) 40 MG tablet   Other Relevant Orders   CBC with Differential/Platelet   Comprehensive metabolic panel   Lipid Panel w/o Chol/HDL Ratio   TSH   UA/M w/rflx Culture, Routine (Completed)   Anxiety disorder due to medical condition    Stable. Does not want any medication at this time. Continue to monitor. Call with any concerns.       Relevant Orders   CBC with Differential/Platelet   Comprehensive metabolic panel   TSH   UA/M w/rflx Culture, Routine (Completed)    Other Visit Diagnoses    Routine general medical examination at a health care facility    -  Primary   Vaccines up to date. Screening labs checked today. Continue diet and exercise. Call with any concerns.    Need for influenza vaccination       Flu given today.   Relevant Orders   Flu Vaccine QUAD 36+ mos IM (Completed)   Nail fungus       Will get into  see podiatry. Referral generated today.    Relevant Orders   Ambulatory referral to Podiatry   Callus of foot       Will get into see podiatry. Referral generated today.    Relevant Orders   Ambulatory referral to Podiatry       Discussed aspirin prophylaxis for myocardial infarction prevention and decision was made to continue ASA  LABORATORY TESTING:  Health maintenance labs ordered today as discussed above.   The natural history of prostate cancer and ongoing  controversy regarding screening and potential treatment outcomes of prostate cancer has been discussed with the patient. The meaning of a false positive PSA and a false negative PSA has been discussed. He indicates understanding of the limitations of this screening test and wishes to proceed with screening PSA testing.   IMMUNIZATIONS:   - Tdap: Tetanus vaccination status reviewed: last tetanus booster within 10 years. - Influenza: Administered today - Pneumovax: Up to date  PATIENT COUNSELING:    Sexuality: Discussed sexually transmitted diseases, partner selection, use of condoms, avoidance of unintended pregnancy  and contraceptive alternatives.   Advised to avoid cigarette smoking.  I discussed with the patient that most people either abstain from alcohol or drink within safe limits (<=14/week and <=4 drinks/occasion for males, <=7/weeks and <= 3 drinks/occasion for females) and that the risk for alcohol disorders and other health effects rises proportionally with the number of drinks per week and how often a drinker exceeds daily limits.  Discussed cessation/primary prevention of drug use and availability of treatment for abuse.   Diet: Encouraged to adjust caloric intake to maintain  or achieve ideal body weight, to reduce intake of dietary saturated fat and total fat, to limit sodium intake by avoiding high sodium foods and not adding table salt, and to maintain adequate dietary potassium and calcium preferably from fresh fruits, vegetables, and low-fat dairy products.    stressed the importance of regular exercise  Injury prevention: Discussed safety belts, safety helmets, smoke detector, smoking near bedding or upholstery.   Dental health: Discussed importance of regular tooth brushing, flossing, and dental visits.   Follow up plan: NEXT PREVENTATIVE PHYSICAL DUE IN 1 YEAR. Return in about 4 weeks (around 07/20/2017) for BP follow up.

## 2017-06-22 NOTE — Assessment & Plan Note (Signed)
Has been treated. Checking viral load today. Call with any concerns.

## 2017-06-22 NOTE — Assessment & Plan Note (Signed)
Stopped the flomax as it wasn't helping. Checking PSA today. Await results.

## 2017-06-22 NOTE — Patient Instructions (Signed)

## 2017-06-22 NOTE — Assessment & Plan Note (Signed)
Not under good control. Will increase lisinopril to 20mg  and recheck 1 month.

## 2017-06-23 ENCOUNTER — Telehealth: Payer: Self-pay | Admitting: Family Medicine

## 2017-06-23 LAB — COMPREHENSIVE METABOLIC PANEL
ALT: 9 IU/L (ref 0–44)
AST: 11 IU/L (ref 0–40)
Albumin/Globulin Ratio: 1.3 (ref 1.2–2.2)
Albumin: 4.1 g/dL (ref 3.5–5.5)
Alkaline Phosphatase: 178 IU/L — ABNORMAL HIGH (ref 39–117)
BUN/Creatinine Ratio: 14 (ref 9–20)
BUN: 19 mg/dL (ref 6–24)
Bilirubin Total: 0.3 mg/dL (ref 0.0–1.2)
CO2: 22 mmol/L (ref 20–29)
Calcium: 8.9 mg/dL (ref 8.7–10.2)
Chloride: 90 mmol/L — ABNORMAL LOW (ref 96–106)
Creatinine, Ser: 1.35 mg/dL — ABNORMAL HIGH (ref 0.76–1.27)
GFR calc Af Amer: 73 mL/min/{1.73_m2} (ref >59)
GFR calc non Af Amer: 63 mL/min/{1.73_m2} (ref >59)
Globulin, Total: 3.1 g/dL (ref 1.5–4.5)
Glucose: 644 mg/dL (ref 65–99)
Potassium: 4.5 mmol/L (ref 3.5–5.2)
Sodium: 128 mmol/L — ABNORMAL LOW (ref 134–144)
Total Protein: 7.2 g/dL (ref 6.0–8.5)

## 2017-06-23 LAB — CBC WITH DIFFERENTIAL/PLATELET
Basophils Absolute: 0 10*3/uL (ref 0.0–0.2)
Basos: 1 %
EOS (ABSOLUTE): 0.2 10*3/uL (ref 0.0–0.4)
Eos: 3 %
Hematocrit: 35.1 % — ABNORMAL LOW (ref 37.5–51.0)
Hemoglobin: 10.7 g/dL — ABNORMAL LOW (ref 13.0–17.7)
Immature Grans (Abs): 0 10*3/uL (ref 0.0–0.1)
Immature Granulocytes: 0 %
Lymphocytes Absolute: 2.3 10*3/uL (ref 0.7–3.1)
Lymphs: 31 %
MCH: 27.6 pg (ref 26.6–33.0)
MCHC: 30.5 g/dL — ABNORMAL LOW (ref 31.5–35.7)
MCV: 91 fL (ref 79–97)
Monocytes Absolute: 0.6 10*3/uL (ref 0.1–0.9)
Monocytes: 8 %
Neutrophils Absolute: 4.2 10*3/uL (ref 1.4–7.0)
Neutrophils: 57 %
Platelets: 296 10*3/uL (ref 150–379)
RBC: 3.88 x10E6/uL — ABNORMAL LOW (ref 4.14–5.80)
RDW: 13.5 % (ref 12.3–15.4)
WBC: 7.3 10*3/uL (ref 3.4–10.8)

## 2017-06-23 LAB — PSA: Prostate Specific Ag, Serum: 0.1 ng/mL (ref 0.0–4.0)

## 2017-06-23 LAB — LIPID PANEL W/O CHOL/HDL RATIO
Cholesterol, Total: 234 mg/dL — ABNORMAL HIGH (ref 100–199)
HDL: 47 mg/dL (ref >39)
Triglycerides: 404 mg/dL — ABNORMAL HIGH (ref 0–149)

## 2017-06-23 LAB — TSH: TSH: 1.71 u[IU]/mL (ref 0.450–4.500)

## 2017-06-23 NOTE — Telephone Encounter (Signed)
Received a call from the on-call nurse line about a critical glucose value of 644 from pt's CPE yesterday morning. They were unable to make contact to triage. I was able to reach pt, who was very angry about getting a call. I discussed the lab value with him and asked if he'd checked his sugars in the past 48 hours at home to which he said no. I recommended he check right away and go to the ER if still above 400, pt said he was going back to bed. States he feels fine. Recommended increasing his insulin 2 units per day until numbers improve, drinking lots of water, and going to ER for new sxs or continued high readings.

## 2017-06-25 ENCOUNTER — Other Ambulatory Visit: Payer: Self-pay | Admitting: Family Medicine

## 2017-06-25 ENCOUNTER — Encounter: Payer: Self-pay | Admitting: Family Medicine

## 2017-06-25 MED ORDER — EMPAGLIFLOZIN 25 MG PO TABS: 25.0000 mg | ORAL_TABLET | Freq: Every day | ORAL | 1 refills | Status: DC

## 2017-07-03 ENCOUNTER — Emergency Department
Admission: EM | Admit: 2017-07-03 | Discharge: 2017-07-03 | Disposition: A | Discharge status: Home / Self Care | Payer: BLUE CROSS/BLUE SHIELD | Attending: Emergency Medicine | Admitting: Emergency Medicine

## 2017-07-03 ENCOUNTER — Emergency Department: Payer: BLUE CROSS/BLUE SHIELD

## 2017-07-03 ENCOUNTER — Encounter: Payer: Self-pay | Admitting: Emergency Medicine

## 2017-07-03 ENCOUNTER — Other Ambulatory Visit: Payer: Self-pay

## 2017-07-03 ENCOUNTER — Telehealth: Payer: Self-pay

## 2017-07-03 DIAGNOSIS — I129 Hypertensive chronic kidney disease with stage 1 through stage 4 chronic kidney disease, or unspecified chronic kidney disease: Secondary | ICD-10-CM | POA: Insufficient documentation

## 2017-07-03 DIAGNOSIS — I959 Hypotension, unspecified: Secondary | ICD-10-CM | POA: Insufficient documentation

## 2017-07-03 DIAGNOSIS — I9589 Other hypotension: Secondary | ICD-10-CM

## 2017-07-03 DIAGNOSIS — F17210 Nicotine dependence, cigarettes, uncomplicated: Secondary | ICD-10-CM | POA: Insufficient documentation

## 2017-07-03 DIAGNOSIS — R197 Diarrhea, unspecified: Secondary | ICD-10-CM | POA: Diagnosis not present

## 2017-07-03 DIAGNOSIS — E861 Hypovolemia: Secondary | ICD-10-CM

## 2017-07-03 DIAGNOSIS — Z7982 Long term (current) use of aspirin: Secondary | ICD-10-CM | POA: Insufficient documentation

## 2017-07-03 DIAGNOSIS — E1122 Type 2 diabetes mellitus with diabetic chronic kidney disease: Secondary | ICD-10-CM | POA: Diagnosis not present

## 2017-07-03 DIAGNOSIS — Z79899 Other long term (current) drug therapy: Secondary | ICD-10-CM | POA: Insufficient documentation

## 2017-07-03 DIAGNOSIS — D72825 Bandemia: Secondary | ICD-10-CM | POA: Diagnosis not present

## 2017-07-03 DIAGNOSIS — N189 Chronic kidney disease, unspecified: Secondary | ICD-10-CM | POA: Diagnosis not present

## 2017-07-03 DIAGNOSIS — R112 Nausea with vomiting, unspecified: Secondary | ICD-10-CM | POA: Insufficient documentation

## 2017-07-03 DIAGNOSIS — Z794 Long term (current) use of insulin: Secondary | ICD-10-CM | POA: Diagnosis not present

## 2017-07-03 DIAGNOSIS — K529 Noninfective gastroenteritis and colitis, unspecified: Secondary | ICD-10-CM

## 2017-07-03 HISTORY — DX: Other psychoactive substance abuse, uncomplicated: F19.10

## 2017-07-03 LAB — CBC WITH DIFFERENTIAL/PLATELET
Basophils Absolute: 0.1 10*3/uL (ref 0–0.1)
Basophils Relative: 0 %
Eosinophils Absolute: 0.1 10*3/uL (ref 0–0.7)
Eosinophils Relative: 1 %
HCT: 44.3 % (ref 40.0–52.0)
Hemoglobin: 14.1 g/dL (ref 13.0–18.0)
Lymphocytes Relative: 14 %
Lymphs Abs: 3.1 10*3/uL (ref 1.0–3.6)
MCH: 27.4 pg (ref 26.0–34.0)
MCHC: 31.8 g/dL — ABNORMAL LOW (ref 32.0–36.0)
MCV: 86.2 fL (ref 80.0–100.0)
Monocytes Absolute: 1.2 10*3/uL — ABNORMAL HIGH (ref 0.2–1.0)
Monocytes Relative: 6 %
Neutro Abs: 17.8 10*3/uL — ABNORMAL HIGH (ref 1.4–6.5)
Neutrophils Relative %: 79 %
Platelets: 500 10*3/uL — ABNORMAL HIGH (ref 150–440)
RBC: 5.14 MIL/uL (ref 4.40–5.90)
RDW: 13.3 % (ref 11.5–14.5)
WBC: 22.3 10*3/uL — ABNORMAL HIGH (ref 3.8–10.6)

## 2017-07-03 LAB — COMPREHENSIVE METABOLIC PANEL
ALT: 11 U/L — ABNORMAL LOW (ref 17–63)
AST: 18 U/L (ref 15–41)
Albumin: 3.9 g/dL (ref 3.5–5.0)
Alkaline Phosphatase: 166 U/L — ABNORMAL HIGH (ref 38–126)
Anion gap: 13 (ref 5–15)
BUN: 29 mg/dL — ABNORMAL HIGH (ref 6–20)
CO2: 21 mmol/L — ABNORMAL LOW (ref 22–32)
Calcium: 9.1 mg/dL (ref 8.9–10.3)
Chloride: 100 mmol/L — ABNORMAL LOW (ref 101–111)
Creatinine, Ser: 1.82 mg/dL — ABNORMAL HIGH (ref 0.61–1.24)
GFR calc Af Amer: 50 mL/min — ABNORMAL LOW (ref >60)
GFR calc non Af Amer: 44 mL/min — ABNORMAL LOW (ref >60)
Glucose, Bld: 353 mg/dL — ABNORMAL HIGH (ref 65–99)
Potassium: 3.9 mmol/L (ref 3.5–5.1)
Sodium: 134 mmol/L — ABNORMAL LOW (ref 135–145)
Total Bilirubin: 0.4 mg/dL (ref 0.3–1.2)
Total Protein: 8.2 g/dL — ABNORMAL HIGH (ref 6.5–8.1)

## 2017-07-03 LAB — GLUCOSE, CAPILLARY: Glucose-Capillary: 271 mg/dL — ABNORMAL HIGH (ref 65–99)

## 2017-07-03 LAB — LIPASE, BLOOD: Lipase: 23 U/L (ref 11–51)

## 2017-07-03 MED ORDER — IOPAMIDOL (ISOVUE-300) INJECTION 61%
75.0000 mL | Freq: Once | INTRAVENOUS | Status: AC | PRN
  Administered 2017-07-03: 75 mL via INTRAVENOUS

## 2017-07-03 MED ORDER — ONDANSETRON HCL 4 MG/2ML IJ SOLN
4.0000 mg | Freq: Once | INTRAMUSCULAR | Status: DC
  Filled 2017-07-03: qty 2

## 2017-07-03 MED ORDER — SODIUM CHLORIDE 0.9 % IV BOLUS
1000.0000 mL | Freq: Once | INTRAVENOUS | Status: AC
  Administered 2017-07-03: 1000 mL via INTRAVENOUS

## 2017-07-03 MED ORDER — ONDANSETRON 4 MG PO TBDP: ORAL_TABLET | ORAL | 0 refills | Status: DC

## 2017-07-03 MED ORDER — AMOXICILLIN-POT CLAVULANATE 875-125 MG PO TABS
1.0000 | ORAL_TABLET | Freq: Once | ORAL | Status: AC
  Administered 2017-07-03: 1 via ORAL
  Filled 2017-07-03: qty 1

## 2017-07-03 MED ORDER — INSULIN ASPART 100 UNIT/ML ~~LOC~~ SOLN
10.0000 [IU] | Freq: Once | SUBCUTANEOUS | Status: AC
  Administered 2017-07-03: 10 [IU] via INTRAVENOUS
  Filled 2017-07-03: qty 1

## 2017-07-03 MED ORDER — CIPROFLOXACIN HCL 500 MG PO TABS: 500.0000 mg | ORAL_TABLET | ORAL | Status: DC

## 2017-07-03 MED ORDER — AMOXICILLIN-POT CLAVULANATE 875-125 MG PO TABS: 1.0000 | ORAL_TABLET | Freq: Two times a day (BID) | ORAL | 0 refills | Status: AC

## 2017-07-03 MED ORDER — METRONIDAZOLE 500 MG PO TABS: 500.0000 mg | ORAL_TABLET | Freq: Once | ORAL | Status: DC

## 2017-07-03 MED ORDER — SODIUM CHLORIDE 0.9 % IV BOLUS
1000.0000 mL | Freq: Once | INTRAVENOUS | Status: AC
Start: 2017-07-03 — End: 2017-07-03
  Administered 2017-07-03: 1000 mL via INTRAVENOUS

## 2017-07-03 NOTE — ED Provider Notes (Signed)
-----------------------------------------   9:00 PM on 07/03/2017 -----------------------------------------  Assuming care from Dr. Don PerkingVeronese.  In short, Cecille PoBarry L Dietrick is a 47 y.o. male with a chief complaint of N/V/D.  Refer to the original H&P for additional details.  The current plan of care is to reassess after CT scan and PO challenge.   ----------------------------------------- 9:53 PM on 07/03/2017 -----------------------------------------  CT scan consistent with extensive enteritis but no other acute pathology.  Patient is tolerating p.o. fluid and feels much better than he did earlier.  Based on my conversation with Dr.  Don PerkingVeronese I explained to him my plan to prescribe Cipro and Flagyl and Zofran and encourage close outpatient follow-up with his provider as well as with GI given the extensive gastroenteritis.  I gave strict return precautions and encouraged him to be very careful monitoring his blood glucose level and adjusting his insulin appropriately if his p.o. intake is less than usual.  He understands and agrees with the plan.  His vital signs are stable after 2 L of IV fluids.   Loleta RoseForbach, Amed Datta, MD 07/04/17 [phone]

## 2017-07-03 NOTE — ED Notes (Signed)
Pt denies nausea at this time and reports he does not need more medication. Pt attempting to drink water at this time. Pt also reports chest pain and back pain have decreased as well. Pt lying in bed at this time in NAD.

## 2017-07-03 NOTE — ED Triage Notes (Signed)
Pt states after eating bojangles he had one large episode of loose stools and multiple episodes of vomiting. Also co chest and back pain.

## 2017-07-03 NOTE — Discharge Instructions (Signed)
We believe your symptoms are caused by either a viral infection or possible a bad food exposure.  Either way, since your symptoms have improved, we feel it is safe for you to go home and follow up with your regular doctor.  Please read the included information and stick to a bland diet for the next two days.  Drink plenty of clear fluids, and if you were provided with a prescription, please take it according to the label instructions.  Given the extent of your enteritis, we did provide a prescription for antibiotics.  Please take the full 10-day course of treatment.  If you develop any new or worsening symptoms, including persistent vomiting not controlled with medication, fever greater than 101, severe or worsening abdominal pain, or other symptoms that concern you, please return immediately to the Emergency Department.

## 2017-07-03 NOTE — ED Provider Notes (Addendum)
[facility] Emergency Department Provider Note  ____________________________________________  Time seen: Approximately 8:27 PM  I have reviewed the triage vital signs and the nursing notes.   HISTORY  Chief Complaint Emesis   HPI Jesse Christian is a 47 y.o. male with a history of type 1 diabetes, opioid dependence on methadone and hyperlipidemia who presents for evaluation of nausea, vomiting, diarrhea.  Patient reports that his symptoms started around 4PM, 30 minutes after patient had [employer].  He reports 1 large watery bowel movement in more than 10 episodes of nonbloody nonbilious emesis.  Patient reports that his abdomen feels sore from the vomiting but he is denying any abdominal pain.  He has had chills but no fever.  No cough or congestion.  No syncope although he feels dizzy.   Past Medical History:  Diagnosis Date  . Diabetes mellitus without complication (HCC)   . Drug abuse (HCC)   . Hyperlipidemia   . Right arm fracture 2018    Patient Active Problem List   Diagnosis Date Noted  . Benign prostatic hyperplasia with nocturia 06/26/2016  . Anxiety disorder due to medical condition 06/22/2015  . Insomnia 06/22/2015  . Hep C w/o coma, chronic (HCC) 06/10/2015  . Benign hypertensive renal disease 06/08/2015  . History of drug abuse in remission 06/08/2015  . Diabetes mellitus with hyperglycemia, with long-term current use of insulin (HCC)   . Hyperlipidemia     No past surgical history on file.  Prior to Admission medications   Medication Sig Start Date End Date Taking? Authorizing Provider  aspirin 81 MG tablet Take 81 mg by mouth daily.     [provider]  atorvastatin (LIPITOR) 40 MG tablet Take 1 tablet (40 mg total) by mouth daily. 06/22/17   [name], [name], DO  empagliflozin (JARDIANCE) 25 MG TABS tablet Take 25 mg by mouth daily. 06/25/17   [name], [name], DO  insulin detemir (LEVEMIR) 100 UNIT/ML injection Inject 0.3  mLs (30 Units total) into the skin 2 (two) times daily. 06/22/17   [name], [name], DO  lisinopril (PRINIVIL,ZESTRIL) 20 MG tablet Take 1 tablet (20 mg total) by mouth daily. 06/22/17   [name], [name], DO  metFORMIN (GLUCOPHAGE-XR) 500 MG 24 hr tablet Take 2 tablets (1,000 mg total) by mouth 2 (two) times daily. 06/22/17   [name], [name], DO  methadone (DOLOPHINE) 10 MG tablet Take 130 mg by mouth daily.     [provider]  NOVOLOG 100 UNIT/ML injection  11/20/16   [provider]  tamsulosin (FLOMAX) 0.4 MG CAPS capsule Take 1 capsule (0.4 mg total) by mouth daily. 06/26/16   Olevia Perches P, DO    Allergies Lyrica [pregabalin]  Family History  Problem Relation Age of Onset  . Mental illness Mother   . Heart attack Father   . Heart disease Father   . Mental illness Sister   . Cancer Maternal Aunt        Lung cancer  . Prostate cancer Neg Hx   . Bladder Cancer Neg Hx   . Kidney cancer Neg Hx     Social History Social History   Tobacco Use  . Smoking status: Current Every Day Smoker    Packs/day: 0.50    Types: Cigarettes  . Smokeless tobacco: Never Used  Substance Use Topics  . Alcohol use: No  . Drug use: No    Review of Systems  Constitutional: Negative for fever. + chills Eyes: Negative  for visual changes. ENT: Negative for sore throat. Neck: No neck pain  Cardiovascular: Negative for chest pain. Respiratory: Negative for shortness of breath. Gastrointestinal: Negative for abdominal pain. + vomiting and diarrhea. Genitourinary: Negative for dysuria. Musculoskeletal: Negative for back pain. Skin: Negative for rash. Neurological: Negative for headaches, weakness or numbness. Psych: No SI or HI  ____________________________________________   PHYSICAL EXAM:  VITAL SIGNS: ED Triage Vitals  Enc Vitals Group     BP 07/03/17 1945 (!) 77/62     Pulse Rate 07/03/17 1945 92     Resp 07/03/17 1945 20     Temp -- 97.66F     Temp Source 07/03/17  1945 Oral     SpO2 07/03/17 1945 96 %     Weight 07/03/17 1946 165 lb (74.8 kg)     Height 07/03/17 1946 6' (1.829 m)     Head Circumference --      Peak Flow --      Pain Score 07/03/17 1945 5     Pain Loc --      Pain Edu? --      Excl. in GC? --     Constitutional: Alert and oriented, looks uncomfortable but no distress. HEENT:      Head: Normocephalic and atraumatic.         Eyes: Conjunctivae are normal. Sclera is non-icteric.       Mouth/Throat: Mucous membranes are moist.       Neck: Supple with no signs of meningismus. Cardiovascular: Regular rate and rhythm. No murmurs, gallops, or rubs. 2+ symmetrical distal pulses are present in all extremities. No JVD. Respiratory: Normal respiratory effort. Lungs are clear to auscultation bilaterally. No wheezes, crackles, or rhonchi.  Gastrointestinal: Soft, non tender, and non distended with positive bowel sounds. No rebound or guarding. Musculoskeletal: Nontender with normal range of motion in all extremities. No edema, cyanosis, or erythema of extremities. Neurologic: Normal speech and language. Face is symmetric. Moving all extremities. No gross focal neurologic deficits are appreciated. Skin: Skin is warm, dry and intact. No rash noted. Psychiatric: Mood and affect are normal. Speech and behavior are normal.  ____________________________________________   LABS (all labs ordered are listed, but only abnormal results are displayed)  Labs Reviewed  CBC WITH DIFFERENTIAL/PLATELET - Abnormal; Notable for the following components:      Result Value   WBC 22.3 (*)    MCHC 31.8 (*)    Platelets 500 (*)    Neutro Abs 17.8 (*)    Monocytes Absolute 1.2 (*)    All other components within normal limits  COMPREHENSIVE METABOLIC PANEL - Abnormal; Notable for the following components:   Sodium 134 (*)    Chloride 100 (*)    CO2 21 (*)    Glucose, Bld 353 (*)    BUN 29 (*)    Creatinine, Ser 1.82 (*)    Total Protein 8.2 (*)    ALT  11 (*)    Alkaline Phosphatase 166 (*)    GFR calc non Af Amer 44 (*)    GFR calc Af Amer 50 (*)    All other components within normal limits  LIPASE, BLOOD  URINALYSIS, COMPLETE (UACMP) WITH MICROSCOPIC  URINE DRUG SCREEN, QUALITATIVE ([facility] ONLY)   ____________________________________________  EKG  ED ECG REPORT I, Nita Sickle, the attending physician, personally viewed and interpreted this ECG.  Normal sinus rhythm, rate of 93, prolonged QTC, normal axis, no STE. unchanged from baseline. ____________________________________________  RADIOLOGY  CT a/p: Pending  ____________________________________________   PROCEDURES  Procedure(s) performed: None Procedures Critical Care performed:  None ____________________________________________   INITIAL IMPRESSION / ASSESSMENT AND PLAN / ED COURSE  47 y.o. male with a history of type 1 diabetes, opioid dependence on methadone and hyperlipidemia who presents for evaluation of nausea, vomiting, diarrhea after eating [employer].  Patient arrives hypotensive with blood pressure of 77/62, afebrile and normal BP.  Abdomen is soft with no tenderness throughout.  Labs showing white count of 22,000 and reactive thrombocytosis with platelets of 500. CMP showing BG 353 and CO2 21 with normal AG. Creatinine at baseline. Patient started on 2L NS and zofran. Patient sent to CT to rule out colitis vs volvulus vs appendicitis given hypotension and elevated WBC. Repeat BP after 1L is 128/82. Patient feels improved. CT pending. Care transferred to Dr. York CeriseForbach.     As part of my medical decision making, I reviewed the following data within the electronic MEDICAL RECORD NUMBER Nursing notes reviewed and incorporated, Labs reviewed , EKG interpreted , Old EKG reviewed, Old chart reviewed, Radiograph reviewed , Notes from prior ED visits and [place] Controlled Substance Database    Pertinent labs & imaging results that were available during my care of the  patient were reviewed by me and considered in my medical decision making (see chart for details).    ____________________________________________   FINAL CLINICAL IMPRESSION(S) / ED DIAGNOSES  Final diagnoses:  Nausea vomiting and diarrhea  Hypotension due to hypovolemia  Bandemia      NEW MEDICATIONS STARTED DURING THIS VISIT:  ED Discharge Orders    None       Note:  This document was prepared using Dragon voice recognition software and may include unintentional dictation errors.    Nita SickleVeronese, [place], MD 07/03/17 2038    Nita SickleVeronese, , MD 07/03/17 2039

## 2017-07-25 ENCOUNTER — Ambulatory Visit: Payer: BLUE CROSS/BLUE SHIELD | Admitting: Family Medicine

## 2017-07-27 ENCOUNTER — Other Ambulatory Visit: Payer: Self-pay | Admitting: Family Medicine

## 2017-08-17 ENCOUNTER — Other Ambulatory Visit: Payer: Self-pay | Admitting: Family Medicine

## 2017-08-29 ENCOUNTER — Other Ambulatory Visit: Payer: Self-pay | Admitting: Family Medicine

## 2017-09-04 ENCOUNTER — Ambulatory Visit: Payer: BLUE CROSS/BLUE SHIELD | Admitting: Family Medicine

## 2017-09-11 ENCOUNTER — Encounter

## 2017-09-11 ENCOUNTER — Ambulatory Visit: Payer: BLUE CROSS/BLUE SHIELD | Admitting: Family Medicine

## 2017-09-23 ENCOUNTER — Inpatient Hospital Stay
Admission: EM | Admit: 2017-09-23 | Discharge: 2017-09-25 | DRG: 638 | Disposition: A | Discharge status: Home / Self Care | Payer: BLUE CROSS/BLUE SHIELD | Attending: Internal Medicine | Admitting: Internal Medicine

## 2017-09-23 ENCOUNTER — Other Ambulatory Visit: Payer: Self-pay

## 2017-09-23 ENCOUNTER — Emergency Department: Payer: BLUE CROSS/BLUE SHIELD

## 2017-09-23 ENCOUNTER — Encounter: Payer: Self-pay | Admitting: Emergency Medicine

## 2017-09-23 DIAGNOSIS — Z8249 Family history of ischemic heart disease and other diseases of the circulatory system: Secondary | ICD-10-CM

## 2017-09-23 DIAGNOSIS — L089 Local infection of the skin and subcutaneous tissue, unspecified: Secondary | ICD-10-CM | POA: Diagnosis present

## 2017-09-23 DIAGNOSIS — Z79899 Other long term (current) drug therapy: Secondary | ICD-10-CM

## 2017-09-23 DIAGNOSIS — F17210 Nicotine dependence, cigarettes, uncomplicated: Secondary | ICD-10-CM | POA: Diagnosis present

## 2017-09-23 DIAGNOSIS — Z7982 Long term (current) use of aspirin: Secondary | ICD-10-CM

## 2017-09-23 DIAGNOSIS — E1365 Other specified diabetes mellitus with hyperglycemia: Secondary | ICD-10-CM

## 2017-09-23 DIAGNOSIS — N400 Enlarged prostate without lower urinary tract symptoms: Secondary | ICD-10-CM | POA: Diagnosis present

## 2017-09-23 DIAGNOSIS — E11628 Type 2 diabetes mellitus with other skin complications: Secondary | ICD-10-CM | POA: Diagnosis present

## 2017-09-23 DIAGNOSIS — E871 Hypo-osmolality and hyponatremia: Secondary | ICD-10-CM | POA: Diagnosis present

## 2017-09-23 DIAGNOSIS — R739 Hyperglycemia, unspecified: Secondary | ICD-10-CM

## 2017-09-23 DIAGNOSIS — Z9111 Patient's noncompliance with dietary regimen: Secondary | ICD-10-CM

## 2017-09-23 DIAGNOSIS — E1165 Type 2 diabetes mellitus with hyperglycemia: Secondary | ICD-10-CM | POA: Diagnosis present

## 2017-09-23 DIAGNOSIS — I10 Essential (primary) hypertension: Secondary | ICD-10-CM | POA: Diagnosis present

## 2017-09-23 DIAGNOSIS — L03116 Cellulitis of left lower limb: Secondary | ICD-10-CM | POA: Diagnosis present

## 2017-09-23 DIAGNOSIS — E785 Hyperlipidemia, unspecified: Secondary | ICD-10-CM | POA: Diagnosis present

## 2017-09-23 DIAGNOSIS — E11621 Type 2 diabetes mellitus with foot ulcer: Principal | ICD-10-CM | POA: Diagnosis present

## 2017-09-23 DIAGNOSIS — Z794 Long term (current) use of insulin: Secondary | ICD-10-CM

## 2017-09-23 DIAGNOSIS — E1140 Type 2 diabetes mellitus with diabetic neuropathy, unspecified: Secondary | ICD-10-CM | POA: Diagnosis present

## 2017-09-23 DIAGNOSIS — Z888 Allergy status to other drugs, medicaments and biological substances status: Secondary | ICD-10-CM

## 2017-09-23 DIAGNOSIS — L97529 Non-pressure chronic ulcer of other part of left foot with unspecified severity: Secondary | ICD-10-CM | POA: Diagnosis present

## 2017-09-23 LAB — CBC WITH DIFFERENTIAL/PLATELET
Basophils Absolute: 0.1 10*3/uL (ref 0–0.1)
Basophils Relative: 1 %
Eosinophils Absolute: 0.2 10*3/uL (ref 0–0.7)
Eosinophils Relative: 3 %
HCT: 32 % — ABNORMAL LOW (ref 40.0–52.0)
Hemoglobin: 10.7 g/dL — ABNORMAL LOW (ref 13.0–18.0)
Lymphocytes Relative: 31 %
Lymphs Abs: 2.2 10*3/uL (ref 1.0–3.6)
MCH: 28.9 pg (ref 26.0–34.0)
MCHC: 33.4 g/dL (ref 32.0–36.0)
MCV: 86.4 fL (ref 80.0–100.0)
Monocytes Absolute: 0.8 10*3/uL (ref 0.2–1.0)
Monocytes Relative: 10 %
Neutro Abs: 4 10*3/uL (ref 1.4–6.5)
Neutrophils Relative %: 55 %
Platelets: 320 10*3/uL (ref 150–440)
RBC: 3.71 MIL/uL — ABNORMAL LOW (ref 4.40–5.90)
RDW: 13.4 % (ref 11.5–14.5)
WBC: 7.3 10*3/uL (ref 3.8–10.6)

## 2017-09-23 LAB — COMPREHENSIVE METABOLIC PANEL
ALT: 11 U/L — ABNORMAL LOW (ref 17–63)
AST: 14 U/L — ABNORMAL LOW (ref 15–41)
Albumin: 3.5 g/dL (ref 3.5–5.0)
Alkaline Phosphatase: 131 U/L — ABNORMAL HIGH (ref 38–126)
Anion gap: 10 (ref 5–15)
BUN: 17 mg/dL (ref 6–20)
CO2: 24 mmol/L (ref 22–32)
Calcium: 8.7 mg/dL — ABNORMAL LOW (ref 8.9–10.3)
Chloride: 94 mmol/L — ABNORMAL LOW (ref 101–111)
Creatinine, Ser: 0.99 mg/dL (ref 0.61–1.24)
GFR calc Af Amer: >60 mL/min (ref >60)
GFR calc non Af Amer: >60 mL/min (ref >60)
Glucose, Bld: 409 mg/dL — ABNORMAL HIGH (ref 65–99)
Potassium: 4.3 mmol/L (ref 3.5–5.1)
Sodium: 128 mmol/L — ABNORMAL LOW (ref 135–145)
Total Bilirubin: 0.6 mg/dL (ref 0.3–1.2)
Total Protein: 7.6 g/dL (ref 6.5–8.1)

## 2017-09-23 LAB — SEDIMENTATION RATE: Sed Rate: 67 mm/hr — ABNORMAL HIGH (ref 0–15)

## 2017-09-23 MED ORDER — ACETAMINOPHEN 500 MG PO TABS
1000.0000 mg | ORAL_TABLET | Freq: Once | ORAL | Status: AC
  Administered 2017-09-23: 1000 mg via ORAL
  Filled 2017-09-23: qty 2

## 2017-09-23 MED ORDER — LISINOPRIL 20 MG PO TABS
20.0000 mg | ORAL_TABLET | Freq: Every day | ORAL | Status: DC
  Administered 2017-09-23 – 2017-09-25 (×3): 20 mg via ORAL
  Filled 2017-09-23 (×3): qty 1

## 2017-09-23 MED ORDER — VANCOMYCIN HCL 10 G IV SOLR
1500.0000 mg | Freq: Once | INTRAVENOUS | Status: AC
  Administered 2017-09-23: 1500 mg via INTRAVENOUS
  Filled 2017-09-23: qty 1500

## 2017-09-23 MED ORDER — ASPIRIN EC 81 MG PO TBEC
81.0000 mg | DELAYED_RELEASE_TABLET | Freq: Every day | ORAL | Status: DC
  Administered 2017-09-23 – 2017-09-25 (×3): 81 mg via ORAL
  Filled 2017-09-23 (×3): qty 1

## 2017-09-23 MED ORDER — TAMSULOSIN HCL 0.4 MG PO CAPS
0.4000 mg | ORAL_CAPSULE | Freq: Every day | ORAL | Status: DC
  Administered 2017-09-23 – 2017-09-25 (×3): 0.4 mg via ORAL
  Filled 2017-09-23 (×3): qty 1

## 2017-09-23 MED ORDER — ENOXAPARIN SODIUM 40 MG/0.4ML ~~LOC~~ SOLN
40.0000 mg | SUBCUTANEOUS | Status: DC
  Administered 2017-09-23 – 2017-09-24 (×2): 40 mg via SUBCUTANEOUS
  Filled 2017-09-23 (×2): qty 0.4

## 2017-09-23 MED ORDER — OXYCODONE HCL 5 MG PO TABS: 5.0000 mg | ORAL_TABLET | ORAL | Status: DC | PRN

## 2017-09-23 MED ORDER — ACETAMINOPHEN 650 MG RE SUPP: 650.0000 mg | Freq: Four times a day (QID) | RECTAL | Status: DC | PRN

## 2017-09-23 MED ORDER — SODIUM CHLORIDE 0.9 % IV BOLUS
1000.0000 mL | Freq: Once | INTRAVENOUS | Status: AC
  Administered 2017-09-23: 1000 mL via INTRAVENOUS

## 2017-09-23 MED ORDER — ACETAMINOPHEN 325 MG PO TABS
650.0000 mg | ORAL_TABLET | Freq: Four times a day (QID) | ORAL | Status: DC | PRN
  Administered 2017-09-23 – 2017-09-24 (×2): 650 mg via ORAL
  Filled 2017-09-23 (×2): qty 2

## 2017-09-23 MED ORDER — VANCOMYCIN HCL 10 G IV SOLR
1250.0000 mg | Freq: Two times a day (BID) | INTRAVENOUS | Status: DC
  Administered 2017-09-23 – 2017-09-25 (×4): 1250 mg via INTRAVENOUS
  Filled 2017-09-23 (×5): qty 1250

## 2017-09-23 MED ORDER — INSULIN DETEMIR 100 UNIT/ML ~~LOC~~ SOLN
30.0000 [IU] | Freq: Every day | SUBCUTANEOUS | Status: DC
  Administered 2017-09-24: 30 [IU] via SUBCUTANEOUS
  Filled 2017-09-23: qty 0.3

## 2017-09-23 MED ORDER — ONDANSETRON HCL 4 MG/2ML IJ SOLN
4.0000 mg | Freq: Four times a day (QID) | INTRAMUSCULAR | Status: DC | PRN
Start: 2017-09-23 — End: 2017-09-25

## 2017-09-23 MED ORDER — METHADONE HCL 10 MG PO TABS
130.0000 mg | ORAL_TABLET | Freq: Every day | ORAL | Status: DC
  Administered 2017-09-24 – 2017-09-25 (×2): 130 mg via ORAL
  Filled 2017-09-23 (×2): qty 13

## 2017-09-23 MED ORDER — ONDANSETRON HCL 4 MG PO TABS: 4.0000 mg | ORAL_TABLET | Freq: Four times a day (QID) | ORAL | Status: DC | PRN

## 2017-09-23 MED ORDER — NICOTINE 21 MG/24HR TD PT24
21.0000 mg | MEDICATED_PATCH | Freq: Every day | TRANSDERMAL | Status: DC
  Administered 2017-09-23 – 2017-09-25 (×3): 21 mg via TRANSDERMAL
  Filled 2017-09-23 (×3): qty 1

## 2017-09-23 MED ORDER — KETOROLAC TROMETHAMINE 15 MG/ML IJ SOLN
15.0000 mg | Freq: Four times a day (QID) | INTRAMUSCULAR | Status: DC | PRN
Start: 2017-09-23 — End: 2017-09-25

## 2017-09-23 NOTE — ED Triage Notes (Signed)
Wound on left foot -  Bottom between toes started fri am.  Now foot/ankle swelling.

## 2017-09-23 NOTE — ED Notes (Signed)
Pt states he is a methadone clinic pt and does not want to take pain medications.

## 2017-09-23 NOTE — ED Provider Notes (Signed)
[facility] Emergency Department Provider Note  ____________________________________________  Time seen: Approximately 1:28 PM  I have reviewed the triage vital signs and the nursing notes.   HISTORY  Chief Complaint Wound Infection    HPI Jesse Christian is a 47 y.o. male w/ a hx of DM presenting w/ foot ulcer and swelling, erythema.  The patient has had several areas of the dorsum of the foot below the second third and fourth digits, as well as blistering between the second and third digit.  Over the past 2 days, the patient has developed significant swelling and pain in the left foot.  He and his wife has tried soaking his foot in Epsom salts and doing basic wound care without significant improvement.  In fact, the foot appears to be worsening.  Not had any systemic illness including fever chills, nausea or vomiting.   Past Medical History:  Diagnosis Date  . Diabetes mellitus without complication (HCC)   . Drug abuse (HCC)   . Hyperlipidemia   . Right arm fracture 2018    Patient Active Problem List   Diagnosis Date Noted  . Benign prostatic hyperplasia with nocturia 06/26/2016  . Anxiety disorder due to medical condition 06/22/2015  . Insomnia 06/22/2015  . Hep C w/o coma, chronic (HCC) 06/10/2015  . Benign hypertensive renal disease 06/08/2015  . History of drug abuse in remission 06/08/2015  . Diabetes mellitus with hyperglycemia, with long-term current use of insulin (HCC)   . Hyperlipidemia     History reviewed. No pertinent surgical history.  Current Outpatient Rx  . Order #: [id number] Class: Historical Med  . Order #: [id number] Class: Normal  . Order #: [id number] Class: Normal  . Order #: [id number] Class: Normal  . Order #: [id number] Class: Normal  . Order #: [id number] Class: Normal  . Order #: [id number] Class: Historical Med  . Order #: [id number] Class: Historical Med  . Order #: [id number] Class: Normal  . Order #: [id number] Class: Print  .  Order #: [id number] Class: Normal    Allergies Lyrica [pregabalin]  Family History  Problem Relation Age of Onset  . Mental illness Mother   . Heart attack Father   . Heart disease Father   . Mental illness Sister   . Cancer Maternal Aunt        Lung cancer  . Prostate cancer Neg Hx   . Bladder Cancer Neg Hx   . Kidney cancer Neg Hx     Social History Social History   Tobacco Use  . Smoking status: Current Every Day Smoker    Packs/day: 0.50    Types: Cigarettes  . Smokeless tobacco: Never Used  Substance Use Topics  . Alcohol use: No  . Drug use: No    Review of Systems Constitutional: No fever/chills.  Lightheadedness or syncope. Eyes: No visual changes. ENT: No sore throat. No congestion or rhinorrhea. Cardiovascular: Denies chest pain. Denies palpitations. Respiratory: Denies shortness of breath.  No cough. Gastrointestinal: No abdominal pain.  No nausea, no vomiting.  No diarrhea.  No constipation. Genitourinary: Negative for dysuria. Musculoskeletal: Negative for back pain.  Positive for left foot wound, swelling, pain. Skin: Negative for rash. Neurological: Negative for headaches. No focal numbness, tingling or weakness.     ____________________________________________   PHYSICAL EXAM:  VITAL SIGNS: ED Triage Vitals  Enc Vitals Group     BP 09/23/17 1143 (!) 174/88     Pulse --      Resp 09/23/17 1143 18     Temp 09/23/17 1143 98.6  F (37 C)     Temp Source 09/23/17 1143 Oral     SpO2 09/23/17 1143 98 %     Weight 09/23/17 1126 165 lb (74.8 kg)     Height 09/23/17 1126 6' (1.829 m)     Head Circumference --      Peak Flow --      Pain Score 09/23/17 1126 9     Pain Loc --      Pain Edu? --      Excl. in GC? --     Constitutional: Alert and oriented. Well appearing and in no acute distress. Answers questions appropriately. Eyes: Conjunctivae are normal.  EOMI. No scleral icterus. Head: Atraumatic. Nose: No  congestion/rhinnorhea. Mouth/Throat: Mucous membranes are moist.  Neck: No stridor.  Supple.   Cardiovascular: Normal rate, regular rhythm. No murmurs, rubs or gallops.  Respiratory: Normal respiratory effort.  No accessory muscle use or retractions. Lungs CTAB.  No wheezes, rales or ronchi. Gastrointestinal: Soft, nontender and nondistended.  No guarding or rebound.  No peritoneal signs. Musculoskeletal: Patient has significant swelling of the left foot.  On the dorsal aspect of the foot, he has multiple ulcers; the largest one is below the second third and fourth digits and is approximately 3.5 x 3.5 cm.  He additionally has another open wound between the second and third digits.  The patient has lymphatic streaking on the tibial shaft.  He has normal DP and PT pulses.  The patient has full range of motion of the left ankle, knee, and hip without pain. Neurologic:  A&Ox3.  Speech is clear.  Face and smile are symmetric.  EOMI.  Moves all extremities well. Skin: See skin exam above. Psychiatric: Mood and affect are normal. Speech and behavior are normal.  Normal judgement.  ____________________________________________   LABS (all labs ordered are listed, but only abnormal results are displayed)  Labs Reviewed  COMPREHENSIVE METABOLIC PANEL - Abnormal; Notable for the following components:      Result Value   Sodium 128 (*)    Chloride 94 (*)    Glucose, Bld 409 (*)    Calcium 8.7 (*)    AST 14 (*)    ALT 11 (*)    Alkaline Phosphatase 131 (*)    All other components within normal limits  CBC WITH DIFFERENTIAL/PLATELET - Abnormal; Notable for the following components:   RBC 3.71 (*)    Hemoglobin 10.7 (*)    HCT 32.0 (*)    All other components within normal limits  CULTURE, BLOOD (ROUTINE X 2)  CULTURE, BLOOD (ROUTINE X 2)  SEDIMENTATION RATE   ____________________________________________  EKG  Not indicated ____________________________________________  RADIOLOGY  No  results found.  ____________________________________________   PROCEDURES  Procedure(s) performed: None  Procedures  Critical Care performed: No ____________________________________________   INITIAL IMPRESSION / ASSESSMENT AND PLAN / ED COURSE  Pertinent labs & imaging results that were available during my care of the patient were reviewed by me and considered in my medical decision making (see chart for details).  47 y.o. male with diabetes presenting with left foot infection, increasing swelling and lymphatic streaking.  Overall, the patient is hemodynamically stable and is not having any systemic illness.  However, I am concerned about a severe foot infection and he is increased risk due to his diabetes.  His laboratory studies are concerning for hyperglycemia without DKA, hyponatremia.  He will be given intravenous fluids for this.  His hyperglycemia is likely driven by combination of  his diabetes as well as his infection.  For his infection, the patient will have elevation of the foot, and immediate antibiotic with bike vancomycin.  The patient will be admitted to the hospitalist for further evaluation and treatment.  ____________________________________________  FINAL CLINICAL IMPRESSION(S) / ED DIAGNOSES  Final diagnoses:  Diabetic ulcer of left foot associated with type 2 diabetes mellitus, unspecified part of foot, unspecified ulcer stage (HCC)  Hyponatremia  Hyperglycemia         NEW MEDICATIONS STARTED DURING THIS VISIT:  New Prescriptions   No medications on file      Rockne Menghini, MD 09/23/17 1350

## 2017-09-23 NOTE — Progress Notes (Signed)
Pharmacy Antibiotic Note  Jesse Christian is a 47 y.o. male admitted on 09/23/2017 with wound infection/DM foot.  Pharmacy has been consulted for vancomycin dosing.  Plan: Vancomycin 1.5 gm IV x 1 in ED followed in approximately 9 hours (stacked dosing) by vancomycin 1.25 gm IV Q12H, predicted trough 16 mcg/ml. Pharmacy will follow and adjust as needed to maintain trough 15 to 20 mcg/ml.   Vd 49.2 L, Ke 0.083 hr-1, T1/2 8.4 hr  Height: 6' (182.9 cm) Weight: 155 lb (70.3 kg) IBW/kg (Calculated) : 77.6  Temp (24hrs), Avg:98.6 F (37 C), Min:98.6 F (37 C), Max:98.6 F (37 C)  Recent Labs  Lab 09/23/17 1147  WBC 7.3  CREATININE 0.99    Estimated Creatinine Clearance: 94.7 mL/min (by C-G formula based on SCr of 0.99 mg/dL).    Allergies  Allergen Reactions  . Lyrica [Pregabalin] Other (See Comments)    Peripheral edema    Antimicrobials this admission:   Dose adjustments this admission:   Microbiology results:  BCx:   UCx:    Sputum:    MRSA PCR:   Thank you for allowing pharmacy to be a part of this patient's care.  Carola FrostNathan A Belal Christian, Pharm.D., BCPS Clinical Pharmacist 09/23/2017 2:23 PM

## 2017-09-23 NOTE — Plan of Care (Signed)

## 2017-09-23 NOTE — H&P (Signed)
[facility] [place] at [facility]   PATIENT NAME: Jesse GlowBarry Christian    MR#:  [id number]  DATE OF BIRTH:  1971-01-03  DATE OF ADMISSION:  09/23/2017  PRIMARY CARE PHYSICIAN: Dorcas CarrowJohnson, [name], DO   REQUESTING/REFERRING PHYSICIAN:   CHIEF COMPLAINT:   Lt foot infection HISTORY OF PRESENT ILLNESS:  Jesse Christian  is a 47 y.o. male with a known history of DM, Hyperlipidemia, drug abuse presenting to the ED with left foot ulcer with redness and swelling.  Patient noticed a blistering on the dorsum of the foot just below second and third and fourth toe areas, he soaked his foot in Epsom salt and did some basic wound care with no significant improvement.  Patient has noticed erythematous tracking marks tracking up from the foot area to the lower leg.  Reporting pain but denies any fever nausea vomiting or shortness of breath.  Patient is started on vancomycin and hospitalist team is called to admit the patient.  PAST MEDICAL HISTORY:   Past Medical History:  Diagnosis Date  . Diabetes mellitus without complication (HCC)   . Drug abuse (HCC)   . Hyperlipidemia   . Right arm fracture 2018    PAST SURGICAL HISTOIRY:  History reviewed. No pertinent surgical history.  SOCIAL HISTORY:   Social History   Tobacco Use  . Smoking status: Current Every Day Smoker    Packs/day: 0.50    Types: Cigarettes  . Smokeless tobacco: Never Used  Substance Use Topics  . Alcohol use: No    FAMILY HISTORY:   Family History  Problem Relation Age of Onset  . Mental illness Mother   . Heart attack Father   . Heart disease Father   . Mental illness Sister   . Cancer Maternal Aunt        Lung cancer  . Prostate cancer Neg Hx   . Bladder Cancer Neg Hx   . Kidney cancer Neg Hx     DRUG ALLERGIES:   Allergies  Allergen Reactions  . Lyrica [Pregabalin] Other (See Comments)    Peripheral edema    REVIEW OF SYSTEMS:  CONSTITUTIONAL: No fever, fatigue or weakness.   EYES: No blurred or double vision.  EARS, NOSE, AND THROAT: No tinnitus or ear pain.  RESPIRATORY: No cough, shortness of breath, wheezing or hemoptysis.  CARDIOVASCULAR: No chest pain, orthopnea, edema.  GASTROINTESTINAL: No nausea, vomiting, diarrhea or abdominal pain.  GENITOURINARY: No dysuria, hematuria.  ENDOCRINE: No polyuria, nocturia,  HEMATOLOGY: No anemia, easy bruising or bleeding SKIN:  left foot redness and swelling and pain with blisters MUSCULOSKELETAL: No joint pain or arthritis.   NEUROLOGIC: No tingling, numbness, weakness.  PSYCHIATRY: No anxiety or depression.   MEDICATIONS AT HOME:   Prior to Admission medications   Medication Sig Start Date End Date Taking? Authorizing Provider  aspirin 81 MG tablet Take 81 mg by mouth daily.    Yes [provider]  LEVEMIR 100 UNIT/ML injection INJECT 30 UNITS TOTAL INTO THE SKIN 2 (TWO) TIMES DAILY. 07/27/17  Yes [name], [name], DO  lisinopril (PRINIVIL,ZESTRIL) 20 MG tablet Take 1 tablet (20 mg total) by mouth daily. 06/22/17  Yes [name], [name], DO  methadone (DOLOPHINE) 10 MG tablet Take 130 mg by mouth daily.    Yes [provider]  NOVOLOG 100 UNIT/ML injection INJECT 0.05-0.15 MLS (5-15 UNITS TOTAL) INTO THE SKIN 3 (THREE) TIMES DAILY. SLIDING SCALE 08/19/17  Yes [name], [name], DO  atorvastatin (LIPITOR) 40  MG tablet Take 1 tablet (40 mg total) by mouth daily. Patient not taking: Reported on 09/23/2017 06/22/17   Olevia Perches P, DO  empagliflozin (JARDIANCE) 25 MG TABS tablet Take 25 mg by mouth daily. Patient not taking: Reported on 09/23/2017 06/25/17   Olevia Perches P, DO  metFORMIN (GLUCOPHAGE-XR) 500 MG 24 hr tablet Take 2 tablets (1,000 mg total) by mouth 2 (two) times daily. Patient not taking: Reported on 09/23/2017 06/22/17   Olevia Perches P, DO  ondansetron (ZOFRAN ODT) 4 MG disintegrating tablet Allow 1-2 tablets to dissolve in your mouth every 8 hours as needed for nausea/vomiting Patient  not taking: Reported on 09/23/2017 07/03/17   Loleta Rose, MD  tamsulosin (FLOMAX) 0.4 MG CAPS capsule Take 1 capsule (0.4 mg total) by mouth daily. Patient not taking: Reported on 09/23/2017 06/26/16   Olevia Perches P, DO      VITAL SIGNS:  Blood pressure (!) 152/88, pulse 81, temperature 98.5 F (36.9 C), resp. rate 15, height 6' (1.829 m), weight 70.3 kg (155 lb), SpO2 100 %.  PHYSICAL EXAMINATION:  GENERAL:  47 y.o.-year-old patient lying in the bed with no acute distress.  EYES: Pupils equal, round, reactive to light and accommodation. No scleral icterus. Extraocular muscles intact.  HEENT: Head atraumatic, normocephalic. Oropharynx and nasopharynx clear.  NECK:  Supple, no jugular venous distention. No thyroid enlargement, no tenderness.  LUNGS: Normal breath sounds bilaterally, no wheezing, rales,rhonchi or crepitation. No use of accessory muscles of respiration.  CARDIOVASCULAR: S1, S2 normal. No murmurs, rubs, or gallops.  ABDOMEN: Soft, nontender, nondistended. Bowel sounds present. No organomegaly or mass.  EXTREMITIES: Left foot is erythematous and edematous and tender to touch in the second third and fourth toe area no pedal edema, cyanosis, or clubbing.  NEUROLOGIC: Cranial nerves II through XII are intact. Muscle strength 5/5 in all extremities. Sensation intact. Gait not checked.  PSYCHIATRIC: The patient is alert and oriented x 3.  SKIN: No obvious rash, lesion, or ulcer.   LABORATORY PANEL:   CBC Recent Labs  Lab 09/23/17 1147  WBC 7.3  HGB 10.7*  HCT 32.0*  PLT 320   ------------------------------------------------------------------------------------------------------------------  Chemistries  Recent Labs  Lab 09/23/17 1147  NA 128*  K 4.3  CL 94*  CO2 24  GLUCOSE 409*  BUN 17  CREATININE 0.99  CALCIUM 8.7*  AST 14*  ALT 11*  ALKPHOS 131*  BILITOT 0.6    ------------------------------------------------------------------------------------------------------------------  Cardiac Enzymes No results for input(s): TROPONINI in the last 168 hours. ------------------------------------------------------------------------------------------------------------------  RADIOLOGY:  Dg Foot Complete Left  Result Date: 09/23/2017 CLINICAL DATA:  Left foot ulcer and swelling. Infection centered below the second third fourth digits. EXAM: LEFT FOOT - COMPLETE 3+ VIEW COMPARISON:  None. FINDINGS: No acute fracture or dislocation. No osseous destruction. Dorsal soft tissue swelling about the midfoot is relatively mild. Vascular calcifications. No soft tissue gas. No radiopaque foreign object. IMPRESSION: Soft tissue swelling, without acute osseous abnormality. Electronically Signed   By: Jeronimo Greaves M.D.   On: 09/23/2017 14:22    EKG:   Orders placed or performed during the hospital encounter of 07/03/17  . EKG 12-Lead  . EKG 12-Lead  . EKG    IMPRESSION AND PLAN:    Axell Trigueros  is a 47 y.o. male with a known history of DM, Hyperlipidemia, drug abuse presenting to the ED with left foot ulcer with redness and swelling.  Patient noticed a blistering on the dorsum of the foot just below second and  third and fourth toe areas, he soaked his foot in Epsom salt and did some basic wound care with no significant improvement.  Patient has noticed erythematous tracking marks tracking up from the foot area to the lower leg.   #Left diabetic foot infection Will obtain wound culture and sensitivity IV vancomycin  wound care Consult podiatry if no improvement  foot x-ray has revealed soft tissue swelling but no osteomyelitis   #Insulin requiring diabetes mellitus-with hyperglycemia  continue Levemir and sliding scale insulin Anion gap and bicarb are in normal range Check hemoglobin A1c  #Hyponatremia hydrate with IV fluids and-correction of the  hyperglycemia will be corrected  #Hypertension-continue lisinopril  #Hyperlipidemia continue statin  #History of drug abuse on methadone continue the same   DVT prophylaxis with Lovenox subcu   All the records are reviewed and case discussed with ED provider. Management plans discussed with the patient, family and they are in agreement.  CODE STATUS: fc   TOTAL TIME TAKING CARE OF THIS PATIENT: 41  minutes.   Note: This dictation was prepared with Dragon dictation along with smaller phrase technology. Any transcriptional errors that result from this process are unintentional.  Ramonita Lab M.D on 09/23/2017 at 3:17 PM  Between 7am to 6pm - Pager - [phone]  After 6pm go to [email or web address] - password [email or web address] [facility]  [facility]  Office  [phone]  CC: Primary care physician; Dorcas Carrow, DO

## 2017-09-24 ENCOUNTER — Inpatient Hospital Stay: Payer: BLUE CROSS/BLUE SHIELD

## 2017-09-24 LAB — COMPREHENSIVE METABOLIC PANEL
ALT: 10 U/L — ABNORMAL LOW (ref 17–63)
AST: 14 U/L — ABNORMAL LOW (ref 15–41)
Albumin: 2.7 g/dL — ABNORMAL LOW (ref 3.5–5.0)
Alkaline Phosphatase: 120 U/L (ref 38–126)
Anion gap: 6 (ref 5–15)
BUN: 15 mg/dL (ref 6–20)
CO2: 26 mmol/L (ref 22–32)
Calcium: 8.4 mg/dL — ABNORMAL LOW (ref 8.9–10.3)
Chloride: 100 mmol/L — ABNORMAL LOW (ref 101–111)
Creatinine, Ser: 0.95 mg/dL (ref 0.61–1.24)
GFR calc Af Amer: >60 mL/min (ref >60)
GFR calc non Af Amer: >60 mL/min (ref >60)
Glucose, Bld: 364 mg/dL — ABNORMAL HIGH (ref 65–99)
Potassium: 3.9 mmol/L (ref 3.5–5.1)
Sodium: 132 mmol/L — ABNORMAL LOW (ref 135–145)
Total Bilirubin: 0.4 mg/dL (ref 0.3–1.2)
Total Protein: 6.1 g/dL — ABNORMAL LOW (ref 6.5–8.1)

## 2017-09-24 LAB — HEMOGLOBIN A1C
Hgb A1c MFr Bld: 12.4 % — ABNORMAL HIGH (ref 4.8–5.6)
Hgb A1c MFr Bld: 11.9 % — ABNORMAL HIGH (ref 4.8–5.6)
Mean Plasma Glucose: 309.18 mg/dL
Mean Plasma Glucose: 294.83 mg/dL

## 2017-09-24 LAB — CBC
HCT: 27.6 % — ABNORMAL LOW (ref 40.0–52.0)
Hemoglobin: 9.3 g/dL — ABNORMAL LOW (ref 13.0–18.0)
MCH: 28.9 pg (ref 26.0–34.0)
MCHC: 33.7 g/dL (ref 32.0–36.0)
MCV: 85.7 fL (ref 80.0–100.0)
Platelets: 279 10*3/uL (ref 150–440)
RBC: 3.23 MIL/uL — ABNORMAL LOW (ref 4.40–5.90)
RDW: 13.6 % (ref 11.5–14.5)
WBC: 6.6 10*3/uL (ref 3.8–10.6)

## 2017-09-24 LAB — GLUCOSE, CAPILLARY
Glucose-Capillary: 469 mg/dL — ABNORMAL HIGH (ref 65–99)
Glucose-Capillary: 384 mg/dL — ABNORMAL HIGH (ref 65–99)
Glucose-Capillary: 128 mg/dL — ABNORMAL HIGH (ref 65–99)
Glucose-Capillary: 232 mg/dL — ABNORMAL HIGH (ref 65–99)

## 2017-09-24 LAB — GLUCOSE, RANDOM: Glucose, Bld: 514 mg/dL (ref 65–99)

## 2017-09-24 LAB — TSH: TSH: 0.687 u[IU]/mL (ref 0.350–4.500)

## 2017-09-24 MED ORDER — INSULIN DETEMIR 100 UNIT/ML ~~LOC~~ SOLN
35.0000 [IU] | Freq: Two times a day (BID) | SUBCUTANEOUS | Status: DC
  Administered 2017-09-24 – 2017-09-25 (×2): 35 [IU] via SUBCUTANEOUS
  Filled 2017-09-24 (×4): qty 0.35

## 2017-09-24 MED ORDER — PIPERACILLIN-TAZOBACTAM 3.375 G IVPB
3.3750 g | Freq: Three times a day (TID) | INTRAVENOUS | Status: DC
  Administered 2017-09-24 – 2017-09-25 (×4): 3.375 g via INTRAVENOUS
  Filled 2017-09-24 (×4): qty 50

## 2017-09-24 MED ORDER — INSULIN ASPART 100 UNIT/ML ~~LOC~~ SOLN
0.0000 [IU] | Freq: Every day | SUBCUTANEOUS | Status: DC
  Administered 2017-09-24: 2 [IU] via SUBCUTANEOUS
  Filled 2017-09-24: qty 1

## 2017-09-24 MED ORDER — INSULIN ASPART 100 UNIT/ML ~~LOC~~ SOLN
16.0000 [IU] | Freq: Once | SUBCUTANEOUS | Status: AC
  Administered 2017-09-24: 16 [IU] via SUBCUTANEOUS
  Filled 2017-09-24: qty 1

## 2017-09-24 MED ORDER — INSULIN ASPART 100 UNIT/ML ~~LOC~~ SOLN
0.0000 [IU] | Freq: Three times a day (TID) | SUBCUTANEOUS | Status: DC
  Administered 2017-09-24: 9 [IU] via SUBCUTANEOUS
  Administered 2017-09-24: 1 [IU] via SUBCUTANEOUS
  Administered 2017-09-25: 7 [IU] via SUBCUTANEOUS
  Administered 2017-09-25: 3 [IU] via SUBCUTANEOUS
  Filled 2017-09-24 (×4): qty 1

## 2017-09-24 NOTE — Progress Notes (Signed)
Inpatient Diabetes Program Recommendations  AACE/ADA: New Consensus Statement on Inpatient Glycemic Control (2015)  Target Ranges:  Prepandial:   less than 140 mg/dL      Peak postprandial:   less than 180 mg/dL (1-2 hours)      Critically ill patients:  140 - 180 mg/dL   Results for Jesse Christian, Jesse Christian (MRN [id number]) as of 09/24/2017 09:46  Ref. Range 09/24/2017 08:12  Glucose-Capillary Latest Ref Range: 65 - 99 mg/dL 284469 (H)  16 units NOVOLOG +  30 units Levemir    Results for Jesse Christian, Jesse Christian (MRN [id number]) as of 09/24/2017 09:46  Ref. Range 09/24/2017 03:51  Hemoglobin A1C Latest Ref Range: 4.8 - 5.6 % 12.4 (H)  Average glucose 309 mg/dl   Admit: Left diabetic foot infection  History: DM, Drug Abuse  Home DM Meds: Levemir 30 units BID       Novolog 5-15 units TID  Current Orders: Levemir 35 units BID      Novolog Sensitive Correction Scale/ SSI (0-9 units) TID AC + HS      PCP: [facility] Olevia [facility]hes[facility]egan [name]-- Last seen 06/22/2017  Note Levemir and Novolog both started this AM.  Current A1c of 12.4% shows extremely poor glucose control at home.  No recommendations yet since Insulins both started this AM.     --Will follow patient during hospitalization--  Ambrose FinlandJeannine Johnston Connor Meacham RN, MSN, CDE Diabetes Coordinator Inpatient Glycemic Control Team Team Pager: [phone] (8a-5p)

## 2017-09-24 NOTE — Progress Notes (Signed)
[facility] - [place] at [facility]   PATIENT NAME: Ruble Pumphrey    MR#:  [id number]  DATE OF BIRTH:  September 27, 1970  SUBJECTIVE:  CHIEF COMPLAINT:   Chief Complaint  Patient presents with  . Wound Infection   - sugars are elevated - left foot plantar surface uclers  REVIEW OF SYSTEMS:  Review of Systems  Constitutional: Negative for chills, fever and malaise/fatigue.  HENT: Negative for congestion, hearing loss and nosebleeds.   Eyes: Negative for blurred vision and double vision.  Respiratory: Negative for cough, shortness of breath and wheezing.   Cardiovascular: Negative for chest pain, palpitations and leg swelling.  Gastrointestinal: Negative for abdominal pain, constipation, diarrhea, nausea and vomiting.  Genitourinary: Negative for dysuria.  Musculoskeletal: Positive for joint pain and myalgias.  Neurological: Negative for dizziness, focal weakness, seizures, weakness and headaches.  Psychiatric/Behavioral: Negative for depression.    DRUG ALLERGIES:   Allergies  Allergen Reactions  . Lyrica [Pregabalin] Other (See Comments)    Peripheral edema    VITALS:  Blood pressure (!) 146/87, pulse 83, temperature 98.4 F (36.9 C), resp. rate 18, height 6' (1.829 m), weight 70.3 kg (155 lb), SpO2 100 %.  PHYSICAL EXAMINATION:  Physical Exam   GENERAL:  47 y.o.-year-old patient lying in the bed with no acute distress.  EYES: Pupils equal, round, reactive to light and accommodation. No scleral icterus. Extraocular muscles intact.  HEENT: Head atraumatic, normocephalic. Oropharynx and nasopharynx clear.  NECK:  Supple, no jugular venous distention. No thyroid enlargement, no tenderness.  LUNGS: Normal breath sounds bilaterally, no wheezing, rales,rhonchi or crepitation. No use of accessory muscles of respiration.  CARDIOVASCULAR: S1, S2 normal. No murmurs, rubs, or gallops.  ABDOMEN: Soft, nontender, nondistended. Bowel sounds present. No organomegaly  or mass.  EXTREMITIES: right foot normal, left foot 1+ edema with open ulcers under metatarsals and between 1st and 2nd toes. No  cyanosis, or clubbing.  NEUROLOGIC: Cranial nerves II through XII are intact. Muscle strength 5/5 in all extremities. Sensation intact. Gait not checked.  PSYCHIATRIC: The patient is alert and oriented x 3.  SKIN: No obvious rash, lesion, or ulcer.    LABORATORY PANEL:   CBC Recent Labs  Lab 09/24/17 0351  WBC 6.6  HGB 9.3*  HCT 27.6*  PLT 279   ------------------------------------------------------------------------------------------------------------------  Chemistries  Recent Labs  Lab 09/24/17 0351 09/24/17 0939  NA 132*  --   K 3.9  --   CL 100*  --   CO2 26  --   GLUCOSE 364* 514*  BUN 15  --   CREATININE 0.95  --   CALCIUM 8.4*  --   AST 14*  --   ALT 10*  --   ALKPHOS 120  --   BILITOT 0.4  --    ------------------------------------------------------------------------------------------------------------------  Cardiac Enzymes No results for input(s): TROPONINI in the last 168 hours. ------------------------------------------------------------------------------------------------------------------  RADIOLOGY:  Dg Foot Complete Left  Result Date: 09/23/2017 CLINICAL DATA:  Left foot ulcer and swelling. Infection centered below the second third fourth digits. EXAM: LEFT FOOT - COMPLETE 3+ VIEW COMPARISON:  None. FINDINGS: No acute fracture or dislocation. No osseous destruction. Dorsal soft tissue swelling about the midfoot is relatively mild. Vascular calcifications. No soft tissue gas. No radiopaque foreign object. IMPRESSION: Soft tissue swelling, without acute osseous abnormality. Electronically Signed   By: Jeronimo Greaves M.D.   On: 09/23/2017 14:22    EKG:   Orders placed or performed during the hospital encounter of 07/03/17  .  EKG 12-Lead  . EKG 12-Lead  . EKG    ASSESSMENT AND PLAN:   47 year old male with past  medical history significant for insulin-dependent diabetes mellitus, prior history of drug abuse currently on a methadone program, hyperlipidemia presents to hospital secondary to left foot blisters and ulceration for 2 days now.  1.  Left foot ulcerations-started after using his shoes for work.  Noted as blisters no open and plantar surface ulcerations are noted. -Foot x-ray with only soft tissue swelling seen no underlying bony abnormality. -Podiatry consulted -Follow blood cultures.  On vancomycin and Zosyn  2.  Insulin-dependent diabetes mellitus with hyperglycemia-A1c is pending -Restarted Levemir twice a day-did not get his dose last night. -No anion gap noted.  Continue to monitor closely. -Also on sliding scale insulin at this time  3.  Hypertension-on lisinopril  4.  History of drug abuse-currently in a methadone program.  Continue methadone.  5.  BPH-Flomax  6.  Tobacco use disorder-continue nicotine patch  7.  DVT prophylaxis-Lovenox     All the records are reviewed and case discussed with Care Management/Social Workerr. Management plans discussed with the patient, family and they are in agreement.  CODE STATUS: Full Code  TOTAL TIME TAKING CARE OF THIS PATIENT: 38 minutes.   POSSIBLE D/C IN 2 DAYS, DEPENDING ON CLINICAL CONDITION.   Enid BaasKALISETTI,Marien Manship M.D on 09/24/2017 at 10:28 AM  Between 7am to 6pm - Pager - [phone]  After 6pm go to [email or web address] - Social research officer, governmentpassword [email or web address]  [facility] [place] [facility]  Office  [phone]  CC: Primary care physician; Dorcas CarrowJohnson, [name], DO

## 2017-09-24 NOTE — Discharge Instructions (Signed)
Fingerstick glucose (sugar) goals for home: Before meals: 80-130 mg/dl 2-Hours after meals: less than 180 mg/dl Hemoglobin [id number] goal: 7% or less  Ask your Endocrinologist about the [facility] continuous glucose meter

## 2017-09-24 NOTE — Progress Notes (Signed)
Informed of venous glucose. No orders

## 2017-09-24 NOTE — Progress Notes (Signed)
Given 16 units of novolog and 30 units of levemir. MD made changes with insulin.   Critical readback by jennifer RN for venous blood glucose. MD informed. Will continue to monitor.

## 2017-09-24 NOTE — Progress Notes (Addendum)
Met with patient earlier this afternoon to discuss current A1c of 12.4%.  Patient states he recently started seeing his PCP at [facility].  No changes made to his insulin regimen at the last visit.  States that he takes Levemir 30 units BID + Novolog SSI with meals.  Has CBG meter at home [facility]) but only checks CBGs once daily b/c he states the test strips are too expensive for him.  Stated he has Neuropathy and stands a lot on his feet at work.  States when he does check his CBGs they are usually "high".  Has been referred to a diabetes specialist (either [facility] or [place]) but has yet to make the appointment.  No issues with taking insulin.  States he knows how to take the insulin and doesn't have a problem taking the insulin.  Guesses at how much Novolog he needs when he doesn't check his CBGs and needs to dose his Novolog.  Discussed with pt the importance of checking CBGs at home in order to safely dose his Novolog and also to see how the insulin is affecting his CBGs.  Reminded pt that without CBG data it is very difficult for his MD to make insulin adjustments.  Reviewed pt's current A1c of 12.4% with him.  Explained what an A1c is and what it measures.  Reminded patient that his goal A1c is 7% or less per ADA standards to prevent both acute and long-term complications.  Explained to patient the extreme importance of good glucose control at home.  Encouraged patient to check his CBGs at least TID AC at home and to record all CBGs in a logbook for his PCP or Endocrinologist to review.  Also reviewed Fingerstick glucose (sugar) goals for home: Before meals: 80-130 mg/dl 2-Hours after meals: less than 180 mg/dl Hemoglobin A1c goal: 7% or less   Patient stated he is interested in getting the [facility] continuous glucose meter.  Stated that his PCP told him he may be able to get the [facility] sensor when he starts seeing an Musician.  Explained to pt what the  [place] sensor is and how it works.  Strongly encouraged pt to make an appt with a local ENDO office and to start checking his CBGs more often at home.  Gave patient information on purchasing an inexpensive CBG meter and strips at [employer] until he can be seen by ENDO.  Meter is $9 and a box of100 strips is $18.    --Will follow patient during hospitalization--  Wyn Quaker RN, MSN, CDE Diabetes Coordinator Inpatient Glycemic Control Team Team Pager: [phone] (8a-5p)

## 2017-09-24 NOTE — Consult Note (Signed)
Reason for Consult: Ulcerations with cellulitis left foot Referring Physician: Dandre Christian is an 47 y.o. male.  HPI: This is a 47 year old male with recent development of some swelling and redness in his left foot over the last couple of days.  Does not specifically recall any injury.  Also states that recently he has lost his left great toenail.  Does relate recently getting a new pair of shoes as well as a kayaking trip a couple of weeks ago.  Past Medical History:  Diagnosis Date  . Diabetes mellitus without complication ([place])   . Drug abuse ([place])   . Hyperlipidemia   . Right arm fracture 2018    History reviewed. No pertinent surgical history.  Family History  Problem Relation Age of Onset  . Mental illness Mother   . Heart attack Father   . Heart disease Father   . Mental illness Sister   . Cancer Maternal Aunt        Lung cancer  . Prostate cancer Neg Hx   . Bladder Cancer Neg Hx   . Kidney cancer Neg Hx     Social History:  reports that he has been smoking cigarettes.  He has been smoking about 0.50 packs per day. He has never used smokeless tobacco. He reports that he does not drink alcohol or use drugs.  Allergies:  Allergies  Allergen Reactions  . Lyrica [Pregabalin] Other (See Comments)    Peripheral edema    Medications:  Scheduled: . aspirin EC  81 mg Oral Daily  . enoxaparin (LOVENOX) injection  40 mg Subcutaneous Q24H  . insulin aspart  0-5 Units Subcutaneous QHS  . insulin aspart  0-9 Units Subcutaneous TID WC  . insulin detemir  35 Units Subcutaneous BID  . lisinopril  20 mg Oral Daily  . methadone  130 mg Oral Daily  . nicotine  21 mg Transdermal Daily  . tamsulosin  0.4 mg Oral Daily    Results for orders placed or performed during the hospital encounter of 09/23/17 (from the past 48 hour(s))  Aerobic/Anaerobic Culture (surgical/deep wound)     Status: None (Preliminary result)   Collection Time: 09/23/17 11:24 AM  Result Value  Ref Range   Specimen Description      FOOT LEFT Performed at [facility], [address], [place], [place] [address]    Special Requests      NONE Performed at [facility], [address], [place], [place] [address]    Gram Stain      RARE WBC PRESENT, PREDOMINANTLY PMN RARE GRAM POSITIVE COCCI IN PAIRS Performed at [facility], [place] [address] [place],  [address]    Culture PENDING    Report Status PENDING   Comprehensive metabolic panel     Status: Abnormal   Collection Time: 09/23/17 11:47 AM  Result Value Ref Range   Sodium 128 (L) 135 - 145 mmol/L   Potassium 4.3 3.5 - 5.1 mmol/L   Chloride 94 (L) 101 - 111 mmol/L   CO2 24 22 - 32 mmol/L   Glucose, Bld 409 (H) 65 - 99 mg/dL   BUN 17 6 - 20 mg/dL   Creatinine, Ser 0.99 0.61 - 1.24 mg/dL   Calcium 8.7 (L) 8.9 - 10.3 mg/dL   Total Protein 7.6 6.5 - 8.1 g/dL   Albumin 3.5 3.5 - 5.0 g/dL   AST 14 (L) 15 - 41 U/L   ALT 11 (L) 17 - 63  U/L   Alkaline Phosphatase 131 (H) 38 - 126 U/L   Total Bilirubin 0.6 0.3 - 1.2 mg/dL   GFR calc non Af Amer >60 >60 mL/min   GFR calc Af Amer >60 >60 mL/min    Comment: (NOTE) The eGFR has been calculated using the CKD EPI equation. This calculation has not been validated in all clinical situations. eGFR's persistently <60 mL/min signify possible Chronic Kidney Disease.    Anion gap 10 5 - 15    Comment: Performed at [facility], [address] [place], [place] [address]  CBC with Differential     Status: Abnormal   Collection Time: 09/23/17 11:47 AM  Result Value Ref Range   WBC 7.3 3.8 - 10.6 K/uL   RBC 3.71 (L) 4.40 - 5.90 MIL/uL   Hemoglobin 10.7 (L) 13.0 - 18.0 g/dL   HCT 32.0 (L) 40.0 - 52.0 %   MCV 86.4 80.0 - 100.0 fL   MCH 28.9 26.0 - 34.0 pg   MCHC 33.4 32.0 - 36.0 g/dL   RDW 13.4 11.5 - 14.5 %   Platelets 320 150 - 440 K/uL   Neutrophils Relative % 55 %   Neutro Abs 4.0 1.4 - 6.5 K/uL   Lymphocytes Relative 31 %   Lymphs  Abs 2.2 1.0 - 3.6 K/uL   Monocytes Relative 10 %   Monocytes Absolute 0.8 0.2 - 1.0 K/uL   Eosinophils Relative 3 %   Eosinophils Absolute 0.2 0 - 0.7 K/uL   Basophils Relative 1 %   Basophils Absolute 0.1 0 - 0.1 K/uL    Comment: Performed at [facility], [address] [place], [place] [address]  Sedimentation rate     Status: Abnormal   Collection Time: 09/23/17 11:47 AM  Result Value Ref Range   Sed Rate 67 (H) 0 - 15 mm/hr    Comment: Performed at [facility], [address] [place], [place] [address]  Blood culture (routine x 2)     Status: None (Preliminary result)   Collection Time: 09/23/17  1:28 PM  Result Value Ref Range   Specimen Description BLOOD RIGHT ANTECUBITAL    Special Requests      BOTTLES DRAWN AEROBIC AND ANAEROBIC Blood Culture results may not be optimal due to an excessive volume of blood received in culture bottles   Culture      NO GROWTH < 24 HOURS Performed at [facility], [address] [place], [place] [address]    Report Status PENDING   Blood culture (routine x 2)     Status: None (Preliminary result)   Collection Time: 09/23/17  1:33 PM  Result Value Ref Range   Specimen Description BLOOD RIGHT ANTECUBITAL    Special Requests      BOTTLES DRAWN AEROBIC AND ANAEROBIC Blood Culture adequate volume   Culture      NO GROWTH < 24 HOURS Performed at [facility], [address] [place], [place] [address]    Report Status PENDING   Hemoglobin A1c     Status: Abnormal   Collection Time: 09/24/17  3:51 AM  Result Value Ref Range   Hgb A1c MFr Bld 12.4 (H) 4.8 - 5.6 %    Comment: (NOTE) Pre diabetes:          5.7%-6.4% Diabetes:              >6.4% Glycemic control for   <7.0% adults with diabetes    Mean Plasma Glucose  309.18 mg/dL    Comment: Performed at [facility], [place] [address], [place], [place] [address]  TSH     Status: None   Collection Time: 09/24/17  3:51 AM  Result Value Ref  Range   TSH 0.687 0.350 - 4.500 uIU/mL    Comment: Performed by a 3rd Generation assay with a functional sensitivity of <=0.01 uIU/mL. Performed at [facility], [address] [place], [place] [address]   CBC     Status: Abnormal   Collection Time: 09/24/17  3:51 AM  Result Value Ref Range   WBC 6.6 3.8 - 10.6 K/uL   RBC 3.23 (L) 4.40 - 5.90 MIL/uL   Hemoglobin 9.3 (L) 13.0 - 18.0 g/dL   HCT 27.6 (L) 40.0 - 52.0 %   MCV 85.7 80.0 - 100.0 fL   MCH 28.9 26.0 - 34.0 pg   MCHC 33.7 32.0 - 36.0 g/dL   RDW 13.6 11.5 - 14.5 %   Platelets 279 150 - 440 K/uL    Comment: Performed at [facility], [address] [place], [place] [address]  Comprehensive metabolic panel     Status: Abnormal   Collection Time: 09/24/17  3:51 AM  Result Value Ref Range   Sodium 132 (L) 135 - 145 mmol/L   Potassium 3.9 3.5 - 5.1 mmol/L   Chloride 100 (L) 101 - 111 mmol/L   CO2 26 22 - 32 mmol/L   Glucose, Bld 364 (H) 65 - 99 mg/dL   BUN 15 6 - 20 mg/dL   Creatinine, Ser 0.95 0.61 - 1.24 mg/dL   Calcium 8.4 (L) 8.9 - 10.3 mg/dL   Total Protein 6.1 (L) 6.5 - 8.1 g/dL   Albumin 2.7 (L) 3.5 - 5.0 g/dL   AST 14 (L) 15 - 41 U/L   ALT 10 (L) 17 - 63 U/L   Alkaline Phosphatase 120 38 - 126 U/L   Total Bilirubin 0.4 0.3 - 1.2 mg/dL   GFR calc non Af Amer >60 >60 mL/min   GFR calc Af Amer >60 >60 mL/min    Comment: (NOTE) The eGFR has been calculated using the CKD EPI equation. This calculation has not been validated in all clinical situations. eGFR's persistently <60 mL/min signify possible Chronic Kidney Disease.    Anion gap 6 5 - 15    Comment: Performed at [facility], [address] [place], [place] [address]  Glucose, capillary     Status: Abnormal   Collection Time: 09/24/17  8:12 AM  Result Value Ref Range   Glucose-Capillary 469 (H) 65 - 99 mg/dL  Glucose, random     Status: Abnormal   Collection Time: 09/24/17  9:39 AM  Result Value Ref Range   Glucose, Bld  514 (HH) 65 - 99 mg/dL    Comment: CRITICAL RESULT CALLED TO, READ BACK BY AND VERIFIED WITH JENNIFER HOLT '@1017'$  09/24/17 MGP Performed at [facility], [address] [place], [place] [address]   Glucose, capillary     Status: Abnormal   Collection Time: 09/24/17 11:43 AM  Result Value Ref Range   Glucose-Capillary 384 (H) 65 - 99 mg/dL    Dg Foot Complete Left  Result Date: 09/23/2017 CLINICAL DATA:  Left foot ulcer and swelling. Infection centered below the second third fourth digits. EXAM: LEFT FOOT - COMPLETE 3+ VIEW COMPARISON:  None. FINDINGS: No acute fracture or dislocation. No osseous destruction. Dorsal soft tissue swelling about the midfoot is relatively mild. Vascular  calcifications. No soft tissue gas. No radiopaque foreign object. IMPRESSION: Soft tissue swelling, without acute osseous abnormality. Electronically Signed   By: Abigail Miyamoto M.D.   On: 09/23/2017 14:22    Review of Systems  Constitutional: Negative for chills and fever.  HENT: Negative.   Eyes: Negative.   Respiratory: Negative.   Cardiovascular: Negative.   Gastrointestinal: Negative for nausea and vomiting.  Genitourinary: Negative.   Musculoskeletal: Negative.   Skin:       Recently noticed some drainage and sores on his left foot a couple of days ago.  Also recently lost his left great toenail.  Has had some significant swelling and redness over the last couple of days as well.  Neurological:       He does relate significant neuropathy associated with his diabetes.  Endo/Heme/Allergies: Negative.   Psychiatric/Behavioral: Negative.    Blood pressure (!) 146/87, pulse 83, temperature 98.4 F (36.9 C), resp. rate 18, height 6' (1.829 m), weight 70.3 kg (155 lb), SpO2 100 %. Physical Exam  Cardiovascular:  DP pulses are 2/4 bilateral.  PT pulses are 1/4 bilateral.  Musculoskeletal:  Adequate range of motion of the pedal joints.  Muscle testing is 5/5.  Neurological:  Complete loss of  protective threshold with monofilament wire distally in the feet and toes as well as lower legs.  Proprioception impaired.  Skin:  The skin is warm dry and supple.  Significant edema with some erythema in the left foot with some mild streaking along the dorsum of the foot.  A large superficial ulceration is noted on the left forefoot beneath the second metatarsal area approximately 2.5 cm x 2.0 cm.  A couple of smaller full-thickness ulcerations are noted on the plantar base and medial aspect of the left second toe approximately 2 to 3 mm diameter with deeper extension down towards the deep structures.  Also a full-thickness ulceration on the plantar aspect of the left hallux at the plantar crease approximately 4 mm diameter with some exposed tendinous structure.  No expressible purulence from any of these areas.    Assessment/Plan: Assessment: 1.  Superficial and full-thickness ulcerations left forefoot and toes. 2.  Swelling with cellulitis left foot. 3.  Diabetes with associated neuropathy.   Plan: Excisional debridement of devitalized tissue from the full-thickness ulcerations on the left second toe x2 and the left hallux.  Saline wet-to-dry gauze dressings applied to the forefoot.  Discussed with the patient that hopefully these should resolve with local wound care and oral antibiotics on discharge although I do recommend that we go ahead and obtain an MRI to make sure there is no deeper abscess especially with the exposed tendon.  Patient will need daily dressing changes with Bactroban and a light gauze bandage.  Hopefully if no deep abscess he should be stable for discharge within a day or so and we will follow-up outpatient  Durward Fortes 09/24/2017, 1:22 PM

## 2017-09-24 NOTE — Progress Notes (Signed)
Paged prime doc for CBG checks, SSI, wound/podiatry consult. Awaiting orders.

## 2017-09-24 NOTE — Progress Notes (Signed)
Pharmacy Antibiotic Note  Jesse Christian is a 47 y.o. male admitted on 09/23/2017 with Wound/DM foot.  Pharmacy has been consulted for Zosyn dosing. Patient is currently on Vancomycin 1250mg  IV q12h  Plan: Zosyn 3.375g IV q8h (4 hour infusion). Continue with current Vancomycin dosing  Height: 6' (182.9 cm) Weight: 155 lb (70.3 kg) IBW/kg (Calculated) : 77.6  Temp (24hrs), Avg:98.4 F (36.9 C), Min:98.4 F (36.9 C), Max:98.4 F (36.9 C)  Recent Labs  Lab 09/23/17 1147 09/24/17 0351  WBC 7.3 6.6  CREATININE 0.99 0.95    Estimated Creatinine Clearance: 98.7 mL/min (by C-G formula based on SCr of 0.95 mg/dL).    Allergies  Allergen Reactions  . Lyrica [Pregabalin] Other (See Comments)    Peripheral edema    Antimicrobials this admission: Vancomycin 6/16 >>  Zosyn 6/17 >>    Thank you for allowing pharmacy to be a part of this patient's care.  Clovia CuffLisa Perlie Stene, PharmD, BCPS 09/24/2017 3:54 PM

## 2017-09-25 LAB — BASIC METABOLIC PANEL
Anion gap: 9 (ref 5–15)
BUN: 15 mg/dL (ref 6–20)
CO2: 25 mmol/L (ref 22–32)
Calcium: 8.7 mg/dL — ABNORMAL LOW (ref 8.9–10.3)
Chloride: 100 mmol/L — ABNORMAL LOW (ref 101–111)
Creatinine, Ser: 1.02 mg/dL (ref 0.61–1.24)
GFR calc Af Amer: >60 mL/min (ref >60)
GFR calc non Af Amer: >60 mL/min (ref >60)
Glucose, Bld: 297 mg/dL — ABNORMAL HIGH (ref 65–99)
Potassium: 3.7 mmol/L (ref 3.5–5.1)
Sodium: 134 mmol/L — ABNORMAL LOW (ref 135–145)

## 2017-09-25 LAB — GLUCOSE, CAPILLARY
Glucose-Capillary: 326 mg/dL — ABNORMAL HIGH (ref 65–99)
Glucose-Capillary: 237 mg/dL — ABNORMAL HIGH (ref 65–99)

## 2017-09-25 LAB — HIV ANTIBODY (ROUTINE TESTING W REFLEX): HIV Screen 4th Generation wRfx: NONREACTIVE

## 2017-09-25 LAB — VANCOMYCIN, TROUGH: Vancomycin Tr: 19 ug/mL (ref 15–20)

## 2017-09-25 MED ORDER — SULFAMETHOXAZOLE-TRIMETHOPRIM 800-160 MG PO TABS: 1.0000 | ORAL_TABLET | Freq: Two times a day (BID) | ORAL | 0 refills | Status: AC

## 2017-09-25 MED ORDER — INSULIN ASPART 100 UNIT/ML ~~LOC~~ SOLN
5.0000 [IU] | Freq: Three times a day (TID) | SUBCUTANEOUS | Status: DC
  Administered 2017-09-25: 5 [IU] via SUBCUTANEOUS
  Filled 2017-09-25: qty 1

## 2017-09-25 MED ORDER — MUPIROCIN CALCIUM 2 % EX CREA
TOPICAL_CREAM | Freq: Two times a day (BID) | CUTANEOUS | Status: DC
  Administered 2017-09-25: 13:00:00 via TOPICAL
  Filled 2017-09-25: qty 15

## 2017-09-25 MED ORDER — INSULIN DETEMIR 100 UNIT/ML ~~LOC~~ SOLN
38.0000 [IU] | Freq: Two times a day (BID) | SUBCUTANEOUS | Status: DC
  Filled 2017-09-25: qty 0.38

## 2017-09-25 MED ORDER — INSULIN DETEMIR 100 UNIT/ML ~~LOC~~ SOLN: 40.0000 [IU] | Freq: Two times a day (BID) | SUBCUTANEOUS | 1 refills | Status: DC

## 2017-09-25 MED ORDER — MUPIROCIN CALCIUM 2 % EX CREA: TOPICAL_CREAM | Freq: Two times a day (BID) | CUTANEOUS | 0 refills | Status: DC

## 2017-09-25 NOTE — Discharge Summary (Signed)
[facility] [place] [facility]   PATIENT NAME: Jesse Christian    MR#:  [id number]  DATE OF BIRTH:  Jul 01, 1970  DATE OF ADMISSION:  09/23/2017   ADMITTING PHYSICIAN: Ramonita Lab, MD  DATE OF DISCHARGE:  09/25/17  PRIMARY CARE PHYSICIAN: Dorcas Carrow, DO   ADMISSION DIAGNOSIS:   Hyponatremia [E87.1] Hyperglycemia [R73.9] Diabetic ulcer of left foot associated with type 2 diabetes mellitus, unspecified part of foot, unspecified ulcer stage (HCC) [E11.621, L97.529]  DISCHARGE DIAGNOSIS:   Active Problems:   Foot infection   SECONDARY DIAGNOSIS:   Past Medical History:  Diagnosis Date  . Diabetes mellitus without complication ([facility])   . Drug abuse ([facility])   . Hyperlipidemia   . Right arm fracture 2018    HOSPITAL COURSE:   47 year old male with past medical history significant for insulin-dependent diabetes mellitus, prior history of drug abuse currently on a methadone program, hyperlipidemia presents to hospital secondary to left foot blisters and ulceration for 2 days now.  1.  Left foot ulcerations-started after using his shoes for work.  Noted as blisters no open and plantar surface ulcerations are noted. -Foot x-ray with only soft tissue swelling seen no underlying bony abnormality. - MRI negative for osteomyelitis -Received vancomycin and Zosyn while in the hospital -negative blood cultures. -Podiatry consult appreciated.  Initially wanted to do bride the wounds, however with 24 hours of antibiotics to have significantly improved.  Recommended left foot boot to avoid pressure.  Cover dressing after Bactroban ointment.  Also being discharged on 2 weeks of Bactrim antibiotic. -Outpatient follow-up with podiatry in 1 to 2 weeks  2.  Insulin-dependent diabetes mellitus with hyperglycemia-A1c is  12.4 -Restarted Levemir twice a day- increased dose up to 40 units BID - non compliant with diet -No anion gap noted.  Continue to monitor as  outpatient - also has novolog sliding scale  3.  Hypertension-on lisinopril  4.  History of drug abuse-currently in a methadone program.  Continue methadone.  5.  Tobacco use disorder-received  nicotine patch while inpatient. Counseled.      DISCHARGE CONDITIONS:   Guarded  CONSULTS OBTAINED:   Treatment Team:  Linus Galas, DPM  DRUG ALLERGIES:   Allergies  Allergen Reactions  . Lyrica [Pregabalin] Other (See Comments)    Peripheral edema   DISCHARGE MEDICATIONS:   Allergies as of 09/25/2017      Reactions   Lyrica [pregabalin] Other (See Comments)   Peripheral edema      Medication List    STOP taking these medications   atorvastatin 40 MG tablet Commonly known as:  LIPITOR   empagliflozin 25 MG Tabs tablet Commonly known as:  JARDIANCE   metFORMIN 500 MG 24 hr tablet Commonly known as:  GLUCOPHAGE-XR   ondansetron 4 MG disintegrating tablet Commonly known as:  ZOFRAN ODT   tamsulosin 0.4 MG Caps capsule Commonly known as:  FLOMAX     TAKE these medications   aspirin 81 MG tablet Take 81 mg by mouth daily.   insulin detemir 100 UNIT/ML injection Commonly known as:  LEVEMIR Inject 0.4 mLs (40 Units total) into the skin 2 (two) times daily. What changed:  See the new instructions.   lisinopril 20 MG tablet Commonly known as:  PRINIVIL,ZESTRIL Take 1 tablet (20 mg total) by mouth daily.   methadone 10 MG tablet Commonly known as:  DOLOPHINE Take 130 mg by mouth daily.   mupirocin cream 2 % Commonly known as:  [employer]  Apply topically 2 (two) times daily.   NOVOLOG 100 UNIT/ML injection Generic drug:  insulin aspart INJECT 0.05-0.15 MLS (5-15 UNITS TOTAL) INTO THE SKIN 3 (THREE) TIMES DAILY. SLIDING SCALE   sulfamethoxazole-trimethoprim 800-160 MG tablet Commonly known as:  BACTRIM DS,SEPTRA DS Take 1 tablet by mouth 2 (two) times daily for 14 days.        DISCHARGE INSTRUCTIONS:   1.  Podiatry follow-up in 1 to 2 weeks 2.   PCP follow-up in 2 weeks 3.  Compliance with diabetic medications and low-carb diet recommended  DIET:   Diabetic diet  ACTIVITY:   Activity as tolerated  OXYGEN:   Home Oxygen: No.  Oxygen Delivery: room air  DISCHARGE LOCATION:   home   If you experience worsening of your admission symptoms, develop shortness of breath, life threatening emergency, suicidal or homicidal thoughts you must seek medical attention immediately by calling 911 or calling your MD immediately  if symptoms less severe.  You Must read complete instructions/literature along with all the possible adverse reactions/side effects for all the Medicines you take and that have been prescribed to you. Take any new Medicines after you have completely understood and accpet all the possible adverse reactions/side effects.   Please note  You were cared for by a hospitalist during your hospital stay. If you have any questions about your discharge medications or the care you received while you were in the hospital after you are discharged, you can call the unit and asked to speak with the hospitalist on call if the hospitalist that took care of you is not available. Once you are discharged, your primary care physician will handle any further medical issues. Please note that NO REFILLS for any discharge medications will be authorized once you are discharged, as it is imperative that you return to your primary care physician (or establish a relationship with a primary care physician if you do not have one) for your aftercare needs so that they can reassess your need for medications and monitor your lab values.    On the day of Discharge:  VITAL SIGNS:   Blood pressure 133/85, pulse 77, temperature 97.9 F (36.6 C), temperature source Oral, resp. rate 18, height 6' (1.829 m), weight 70.3 kg (155 lb), SpO2 97 %.  PHYSICAL EXAMINATION:     GENERAL:  47 y.o.-year-old patient lying in the bed with no acute distress.  EYES:  Pupils equal, round, reactive to light and accommodation. No scleral icterus. Extraocular muscles intact.  HEENT: Head atraumatic, normocephalic. Oropharynx and nasopharynx clear.  NECK:  Supple, no jugular venous distention. No thyroid enlargement, no tenderness.  LUNGS: Normal breath sounds bilaterally, no wheezing, rales,rhonchi or crepitation. No use of accessory muscles of respiration.  CARDIOVASCULAR: S1, S2 normal. No murmurs, rubs, or gallops.  ABDOMEN: Soft, nontender, nondistended. Bowel sounds present. No organomegaly or mass.  EXTREMITIES: right foot normal, left foot 1+ edema with open ulcers under metatarsals and between 1st and 2nd toes. No  cyanosis, or clubbing.  NEUROLOGIC: Cranial nerves II through XII are intact. Muscle strength 5/5 in all extremities. Sensation intact. Gait not checked.  PSYCHIATRIC: The patient is alert and oriented x 3.  SKIN: No obvious rash, lesion, or ulcer.    DATA REVIEW:   CBC Recent Labs  Lab 09/24/17 0351  WBC 6.6  HGB 9.3*  HCT 27.6*  PLT 279    Chemistries  Recent Labs  Lab 09/24/17 0351  09/25/17 0533  NA 132*  --  134*  K 3.9  --  3.7  CL 100*  --  100*  CO2 26  --  25  GLUCOSE 364*   < > 297*  BUN 15  --  15  CREATININE 0.95  --  1.02  CALCIUM 8.4*  --  8.7*  AST 14*  --   --   ALT 10*  --   --   ALKPHOS 120  --   --   BILITOT 0.4  --   --    < > = values in this interval not displayed.     Microbiology Results  Results for orders placed or performed during the hospital encounter of 09/23/17  Aerobic/Anaerobic Culture (surgical/deep wound)     Status: None (Preliminary result)   Collection Time: 09/23/17 11:24 AM  Result Value Ref Range Status   Specimen Description   Final    FOOT LEFT Performed at [facility], [address] [place], [place] [address]    Special Requests   Final    NONE Performed at [facility], [address] [place], [place] [address]    Gram Stain   Final     RARE WBC PRESENT, PREDOMINANTLY PMN RARE GRAM POSITIVE COCCI IN PAIRS Performed at [facility], [address], [place], [place] [address]    Culture   Final    FEW STAPHYLOCOCCUS AUREUS CULTURE REINCUBATED FOR BETTER GROWTH NO ANAEROBES ISOLATED; CULTURE IN PROGRESS FOR 5 DAYS    Report Status PENDING  Incomplete  Blood culture (routine x 2)     Status: None (Preliminary result)   Collection Time: 09/23/17  1:28 PM  Result Value Ref Range Status   Specimen Description BLOOD RIGHT ANTECUBITAL  Final   Special Requests   Final    BOTTLES DRAWN AEROBIC AND ANAEROBIC Blood Culture results may not be optimal due to an excessive volume of blood received in culture bottles   Culture   Final    NO GROWTH 2 DAYS Performed at [facility], [address], [place], [place] [address]    Report Status PENDING  Incomplete  Blood culture (routine x 2)     Status: None (Preliminary result)   Collection Time: 09/23/17  1:33 PM  Result Value Ref Range Status   Specimen Description BLOOD RIGHT ANTECUBITAL  Final   Special Requests   Final    BOTTLES DRAWN AEROBIC AND ANAEROBIC Blood Culture adequate volume   Culture   Final    NO GROWTH 2 DAYS Performed at [facility], [address], [place], [place] [address]    Report Status PENDING  Incomplete    RADIOLOGY:  Mr Foot Left Wo Contrast  Result Date: 09/24/2017 CLINICAL DATA:  Foot pain and swelling.  History diabetes. EXAM: MRI OF THE LEFT FOOT WITHOUT CONTRAST TECHNIQUE: Multiplanar, multisequence MR imaging of the left forefoot was performed. No intravenous contrast was administered. COMPARISON:  None. FINDINGS: Patient motion degrades image quality limiting evaluation. Bones/Joint/Cartilage No marrow signal abnormality. No fracture or dislocation. Normal alignment. No joint effusion. No periosteal reaction or bone destruction. Ligaments Collateral ligaments are intact.  Lisfranc joint is intact. Muscles and Tendons  Flexor, peroneal and extensor compartment tendons are intact. Mild T2 hyperintense signal throughout the plantar musculature likely neurogenic. Soft tissue No fluid collection or hematoma.  No soft tissue mass. IMPRESSION: 1. No osteomyelitis of the left forefoot. Electronically Signed   By: Elige Ko   On: 09/24/2017 14:40  Management plans discussed with the patient, family and they are in agreement.  CODE STATUS:     Code Status Orders  (From admission, onward)        Start     Ordered   09/23/17 1516  Full code  Continuous     09/23/17 1515    Code Status History    Date Active Date Inactive Code Status Order ID Comments User Context   12/26/2015 2110 12/27/2015 1954 Full Code [id number]  Altamese Dilling, MD Inpatient      TOTAL TIME TAKING CARE OF THIS PATIENT: 38 minutes.    Enid Baas M.D on 09/25/2017 at 2:10 PM  Between 7am to 6pm - Pager - [phone]  After 6pm go to [email or web address] - Social research officer, government  [facility] [place] [facility]  Office  [phone]  CC: Primary care physician; Dorcas Carrow, DO   Note: This dictation was prepared with Dragon dictation along with smaller phrase technology. Any transcriptional errors that result from this process are unintentional.

## 2017-09-25 NOTE — Consult Note (Signed)
WOC Nurse wound consult note Reason for Consult: Patient consult for Right LE.  Patient has open wounds on left LE and is being treated/followed by podiatry. Notes indicate that right LE is WNL.  WOC nursing team will not follow, but will remain available to this patient, the nursing and medical teams.  Please re-consult if needed. Thanks, Ladona MowLaurie Lakelynn Severtson, MSN, RN, GNP, Hans EdenCWOCN, CWON-AP, FAAN  Pager# [phone]

## 2017-09-25 NOTE — Progress Notes (Signed)
Inpatient Diabetes Program Recommendations  AACE/ADA: New Consensus Statement on Inpatient Glycemic Control (2015)  Target Ranges:  Prepandial:   less than 140 mg/dL      Peak postprandial:   less than 180 mg/dL (1-2 hours)      Critically ill patients:  140 - 180 mg/dL   Results for Jesse Christian, Jesse Christian (MRN [id number]) as of 09/25/2017 09:23  Ref. Range 09/24/2017 08:12 09/24/2017 11:43 09/24/2017 16:40 09/24/2017 21:14  Glucose-Capillary Latest Ref Range: 65 - 99 mg/dL 409469 (H)  16 units NOVOLOG +  30 units LEVEMIR  384 (H)  9 units NOVOLOG  128 (H)  1 unit NOVOLOG  232 (H)  2 units NOVOLOG +  35 units LEVEMIR    Results for Jesse Christian, Jesse Christian (MRN [id number]) as of 09/25/2017 09:23  Ref. Range 09/25/2017 07:48  Glucose-Capillary Latest Ref Range: 65 - 99 mg/dL 956326 (H)    Home DM Meds: Levemir 30 units BID                             Novolog 5-15 units TID  Current Orders: Levemir 35 units BID                            Novolog Sensitive Correction Scale/ SSI (0-9 units) TID AC + HS      MD- Please consider the following in-hospital insulin adjustments:  1. Increase Levemir slightly to 38 units BID (10% increase)  2. Start Novolog Meal Coverage: Novolog 5 units TID with meals (hold if pt eats <50% of meal)      --Will follow patient during hospitalization--  Ambrose FinlandJeannine Johnston Swayzie Choate RN, MSN, CDE Diabetes Coordinator Inpatient Glycemic Control Team Team Pager: [phone] (8a-5p)

## 2017-09-25 NOTE — Progress Notes (Signed)
Subjective/Chief Complaint: Patient seen.  Feeling okay.   Objective: Vital signs in last 24 hours: Temp:  [97.9 F (36.6 C)-98.4 F (36.9 C)] 97.9 F (36.6 C) (06/18 0749) Pulse Rate:  [77-83] 77 (06/18 0749) Resp:  [16-18] 18 (06/18 0749) BP: (133-157)/(85-89) 133/85 (06/18 0749) SpO2:  [97 %-100 %] 97 % (06/18 0749) Last BM Date: 09/24/17  Intake/Output from previous day: 06/17 0701 - 06/18 0700 In: 992.5 [P.O.:720; IV Piggyback:272.5] Out: 2090 [Urine:2090] Intake/Output this shift: No intake/output data recorded.  Bandage on the left foot is for the most part unraveled.  Upon removal of the erythema and the edema in the left foot is improved.  Ulcerations on the left forefoot and toes appears stable with minimal drainage.  Overall improved appearance.  Lab Results:  Recent Labs    09/23/17 1147 09/24/17 0351  WBC 7.3 6.6  HGB 10.7* 9.3*  HCT 32.0* 27.6*  PLT 320 279   BMET Recent Labs    09/24/17 0351 09/24/17 0939 09/25/17 0533  NA 132*  --  134*  K 3.9  --  3.7  CL 100*  --  100*  CO2 26  --  25  GLUCOSE 364* 514* 297*  BUN 15  --  15  CREATININE 0.95  --  1.02  CALCIUM 8.4*  --  8.7*   PT/INR No results for input(s): LABPROT, INR in the last 72 hours. ABG No results for input(s): PHART, HCO3 in the last 72 hours.  Invalid input(s): PCO2, PO2  Studies/Results: Mr Foot Left Wo Contrast  Result Date: 09/24/2017 CLINICAL DATA:  Foot pain and swelling.  History diabetes. EXAM: MRI OF THE LEFT FOOT WITHOUT CONTRAST TECHNIQUE: Multiplanar, multisequence MR imaging of the left forefoot was performed. No intravenous contrast was administered. COMPARISON:  None. FINDINGS: Patient motion degrades image quality limiting evaluation. Bones/Joint/Cartilage No marrow signal abnormality. No fracture or dislocation. Normal alignment. No joint effusion. No periosteal reaction or bone destruction. Ligaments Collateral ligaments are intact.  Lisfranc joint is  intact. Muscles and Tendons Flexor, peroneal and extensor compartment tendons are intact. Mild T2 hyperintense signal throughout the plantar musculature likely neurogenic. Soft tissue No fluid collection or hematoma.  No soft tissue mass. IMPRESSION: 1. No osteomyelitis of the left forefoot. Electronically Signed   By: Elige KoHetal  Patel   On: 09/24/2017 14:40   Dg Foot Complete Left  Result Date: 09/23/2017 CLINICAL DATA:  Left foot ulcer and swelling. Infection centered below the second third fourth digits. EXAM: LEFT FOOT - COMPLETE 3+ VIEW COMPARISON:  None. FINDINGS: No acute fracture or dislocation. No osseous destruction. Dorsal soft tissue swelling about the midfoot is relatively mild. Vascular calcifications. No soft tissue gas. No radiopaque foreign object. IMPRESSION: Soft tissue swelling, without acute osseous abnormality. Electronically Signed   By: Jeronimo GreavesKyle  Talbot M.D.   On: 09/23/2017 14:22    Anti-infectives: Anti-infectives (From admission, onward)   Start     Dose/Rate Route Frequency Ordered Stop   09/24/17 1000  piperacillin-tazobactam (ZOSYN) IVPB 3.375 g     3.375 g 12.5 mL/hr over 240 Minutes Intravenous Every 8 hours 09/24/17 0946     09/24/17 0000  vancomycin (VANCOCIN) 1,250 mg in sodium chloride 0.9 % 250 mL IVPB     1,250 mg 166.7 mL/hr over 90 Minutes Intravenous Every 12 hours 09/23/17 1419     09/23/17 1330  vancomycin (VANCOCIN) 1,500 mg in sodium chloride 0.9 % 500 mL IVPB     1,500 mg 250 mL/hr over 120  Minutes Intravenous  Once 09/23/17 1328 09/23/17 1551      Assessment/Plan: s/p * No surgery found * Assessment: Ulcers left foot, improving.   Plan: Discussed with the patient that his MRI was negative for any deeper abscess.  At this point he should be stable for discharge from a foot standpoint on oral antibiotics and follow-up outpatient in the office next week.  We will dispense a surgical shoe for the left foot and he was instructed on walking flat-footed to  keep pressure off the forefoot.  May need to keep the dressing intact with tape or an Ace wrap to cover.  LOS: 2 days    Ricci Barker 09/25/2017

## 2017-09-26 ENCOUNTER — Telehealth: Payer: Self-pay

## 2017-09-26 NOTE — Telephone Encounter (Signed)
Transition Care Management Follow-up Telephone Call  How have you been since you were released from the hospital? "im doing fine"  Do you understand why you were in the hospital? yes  Do you have a copy of your discharge instructions Yes Do you understand the discharge instrcutions? yes  Where were you discharged to? Home  Do you have support at home? Yes    Items Reviewed:  Medications obtained Yes  Medications reviewed: Yes  Dietary changes reviewed: yes  Home Health? N/A  DME ordered at discharge obtained? NA  Medical supplies: left foot boot, should follow up with podiatry in 1-2 weeks     Functional Questionnaire:   Activities of Daily Living (ADLs):   He states they are independent in the following: ambulation, bathing and hygiene, feeding, continence, grooming, toileting, dressing and medication management States they require assistance with the following: none  Any transportation issues/concerns?: no  Any patient concerns? no  Confirmed importance and date/time of follow-up visits scheduled with PCP: yes 10/02/2017 at 2:30pm with Dr.[name].   Confirm appointment scheduled with specialist? Yes  Confirmed with patient if condition begins to worsen call PCP or If it's emergency go to the ER.

## 2017-09-28 LAB — CULTURE, BLOOD (ROUTINE X 2)
Culture: NO GROWTH
Culture: NO GROWTH
Special Requests: ADEQUATE

## 2017-09-28 LAB — AEROBIC/ANAEROBIC CULTURE W GRAM STAIN (SURGICAL/DEEP WOUND)

## 2017-09-28 LAB — AEROBIC/ANAEROBIC CULTURE (SURGICAL/DEEP WOUND)

## 2017-10-02 ENCOUNTER — Inpatient Hospital Stay: Payer: BLUE CROSS/BLUE SHIELD | Admitting: Family Medicine

## 2017-10-03 NOTE — Telephone Encounter (Signed)
No showed appointment.

## 2017-12-19 NOTE — Telephone Encounter (Signed)
Opened in error

## 2018-01-01 ENCOUNTER — Telehealth: Payer: Self-pay | Admitting: Family Medicine

## 2018-01-01 NOTE — Telephone Encounter (Signed)
Patient dropped off forms which have been placed in Dr Ian MalkinJohnsons box to be filled out. Per patient he spoke with someone who told him he would just need to drop off and would not need an appointment.  I explained he may have to schedule an appointment he is requesting that Dr Henriette CombsJohnson's CMA give him a call back so that he can explain to her exactly what he is needing.  Thank you  [phone]

## 2018-01-03 NOTE — Telephone Encounter (Signed)
Called patient to get him scheduled, he states that he can not come in until he gets his insurance back. Patient notified to contact us when he is available to come in.

## 2018-01-03 NOTE — Telephone Encounter (Signed)
He has not been seen since 06/2017, do you know anything about FMLA paperwork for him.

## 2018-01-03 NOTE — Telephone Encounter (Signed)
Needs an appointment. I have not seen him in 6 months. I will not fill out any paperwork without an appointment.

## 2018-05-05 ENCOUNTER — Encounter: Payer: Self-pay | Admitting: Emergency Medicine

## 2018-05-05 ENCOUNTER — Inpatient Hospital Stay
Admission: EM | Admit: 2018-05-05 | Discharge: 2018-05-12 | DRG: 280 | Disposition: A | Discharge status: Other Acute Inpatient Hospital | Payer: No Typology Code available for payment source | Attending: Internal Medicine | Admitting: Internal Medicine

## 2018-05-05 ENCOUNTER — Emergency Department: Payer: No Typology Code available for payment source

## 2018-05-05 ENCOUNTER — Other Ambulatory Visit: Payer: Self-pay

## 2018-05-05 DIAGNOSIS — I110 Hypertensive heart disease with heart failure: Secondary | ICD-10-CM | POA: Diagnosis present

## 2018-05-05 DIAGNOSIS — Z6822 Body mass index (BMI) 22.0-22.9, adult: Secondary | ICD-10-CM

## 2018-05-05 DIAGNOSIS — D638 Anemia in other chronic diseases classified elsewhere: Secondary | ICD-10-CM | POA: Diagnosis present

## 2018-05-05 DIAGNOSIS — J9601 Acute respiratory failure with hypoxia: Secondary | ICD-10-CM | POA: Diagnosis present

## 2018-05-05 DIAGNOSIS — Z79899 Other long term (current) drug therapy: Secondary | ICD-10-CM

## 2018-05-05 DIAGNOSIS — F419 Anxiety disorder, unspecified: Secondary | ICD-10-CM | POA: Diagnosis present

## 2018-05-05 DIAGNOSIS — E1165 Type 2 diabetes mellitus with hyperglycemia: Secondary | ICD-10-CM | POA: Diagnosis present

## 2018-05-05 DIAGNOSIS — Z8249 Family history of ischemic heart disease and other diseases of the circulatory system: Secondary | ICD-10-CM

## 2018-05-05 DIAGNOSIS — R197 Diarrhea, unspecified: Secondary | ICD-10-CM | POA: Diagnosis not present

## 2018-05-05 DIAGNOSIS — I2510 Atherosclerotic heart disease of native coronary artery without angina pectoris: Secondary | ICD-10-CM | POA: Diagnosis present

## 2018-05-05 DIAGNOSIS — F1120 Opioid dependence, uncomplicated: Secondary | ICD-10-CM | POA: Diagnosis present

## 2018-05-05 DIAGNOSIS — I214 Non-ST elevation (NSTEMI) myocardial infarction: Secondary | ICD-10-CM

## 2018-05-05 DIAGNOSIS — I255 Ischemic cardiomyopathy: Secondary | ICD-10-CM | POA: Diagnosis present

## 2018-05-05 DIAGNOSIS — N170 Acute kidney failure with tubular necrosis: Secondary | ICD-10-CM | POA: Diagnosis present

## 2018-05-05 DIAGNOSIS — Z888 Allergy status to other drugs, medicaments and biological substances status: Secondary | ICD-10-CM

## 2018-05-05 DIAGNOSIS — E871 Hypo-osmolality and hyponatremia: Secondary | ICD-10-CM | POA: Diagnosis present

## 2018-05-05 DIAGNOSIS — R570 Cardiogenic shock: Secondary | ICD-10-CM | POA: Diagnosis present

## 2018-05-05 DIAGNOSIS — F17210 Nicotine dependence, cigarettes, uncomplicated: Secondary | ICD-10-CM | POA: Diagnosis present

## 2018-05-05 DIAGNOSIS — E785 Hyperlipidemia, unspecified: Secondary | ICD-10-CM | POA: Diagnosis present

## 2018-05-05 DIAGNOSIS — B1920 Unspecified viral hepatitis C without hepatic coma: Secondary | ICD-10-CM | POA: Diagnosis present

## 2018-05-05 DIAGNOSIS — E11649 Type 2 diabetes mellitus with hypoglycemia without coma: Secondary | ICD-10-CM | POA: Diagnosis not present

## 2018-05-05 DIAGNOSIS — I5021 Acute systolic (congestive) heart failure: Secondary | ICD-10-CM | POA: Diagnosis present

## 2018-05-05 DIAGNOSIS — E440 Moderate protein-calorie malnutrition: Secondary | ICD-10-CM

## 2018-05-05 DIAGNOSIS — I509 Heart failure, unspecified: Secondary | ICD-10-CM

## 2018-05-05 DIAGNOSIS — Z794 Long term (current) use of insulin: Secondary | ICD-10-CM

## 2018-05-05 DIAGNOSIS — Z7982 Long term (current) use of aspirin: Secondary | ICD-10-CM

## 2018-05-05 LAB — COMPREHENSIVE METABOLIC PANEL
ALBUMIN: 3 g/dL — AB (ref 3.5–5.0)
ALK PHOS: 109 U/L (ref 38–126)
ALT: 12 U/L (ref 0–44)
AST: 15 U/L (ref 15–41)
Anion gap: 8 (ref 5–15)
BUN: 15 mg/dL (ref 6–20)
CO2: 23 mmol/L (ref 22–32)
CREATININE: 1.14 mg/dL (ref 0.61–1.24)
Calcium: 8.6 mg/dL — ABNORMAL LOW (ref 8.9–10.3)
Chloride: 99 mmol/L (ref 98–111)
GFR calc Af Amer: >60 mL/min (ref >60)
GFR calc non Af Amer: >60 mL/min (ref >60)
GLUCOSE: 386 mg/dL — AB (ref 70–99)
Potassium: 4.2 mmol/L (ref 3.5–5.1)
SODIUM: 130 mmol/L — AB (ref 135–145)
Total Bilirubin: 0.7 mg/dL (ref 0.3–1.2)
Total Protein: 6.9 g/dL (ref 6.5–8.1)

## 2018-05-05 LAB — CBC WITH DIFFERENTIAL/PLATELET
ABS IMMATURE GRANULOCYTES: 0.02 10*3/uL (ref 0.00–0.07)
BASOS ABS: 0 10*3/uL (ref 0.0–0.1)
Basophils Relative: 0 %
Eosinophils Absolute: 0.1 10*3/uL (ref 0.0–0.5)
Eosinophils Relative: 1 %
HCT: 31.1 % — ABNORMAL LOW (ref 39.0–52.0)
Hemoglobin: 10.1 g/dL — ABNORMAL LOW (ref 13.0–17.0)
IMMATURE GRANULOCYTES: 0 %
LYMPHS ABS: 2.4 10*3/uL (ref 0.7–4.0)
Lymphocytes Relative: 28 %
MCH: 28 pg (ref 26.0–34.0)
MCHC: 32.5 g/dL (ref 30.0–36.0)
MCV: 86.1 fL (ref 80.0–100.0)
Monocytes Absolute: 0.6 10*3/uL (ref 0.1–1.0)
Monocytes Relative: 7 %
NEUTROS ABS: 5.4 10*3/uL (ref 1.7–7.7)
NEUTROS PCT: 64 %
NRBC: 0 % (ref 0.0–0.2)
Platelets: 357 10*3/uL (ref 150–400)
RBC: 3.61 MIL/uL — AB (ref 4.22–5.81)
RDW: 12.7 % (ref 11.5–15.5)
WBC: 8.5 10*3/uL (ref 4.0–10.5)

## 2018-05-05 LAB — TROPONIN I: TROPONIN I: 1.02 ng/mL — AB (ref <0.03)

## 2018-05-05 LAB — BRAIN NATRIURETIC PEPTIDE: B Natriuretic Peptide: 2486 pg/mL — ABNORMAL HIGH (ref 0.0–100.0)

## 2018-05-05 MED ORDER — IPRATROPIUM-ALBUTEROL 0.5-2.5 (3) MG/3ML IN SOLN
3.0000 mL | Freq: Once | RESPIRATORY_TRACT | Status: AC
  Administered 2018-05-05: 3 mL via RESPIRATORY_TRACT
  Filled 2018-05-05: qty 3

## 2018-05-05 MED ORDER — HEPARIN (PORCINE) 25000 UT/250ML-% IV SOLN
900.0000 [IU]/h | INTRAVENOUS | Status: DC
  Administered 2018-05-06: 900 [IU]/h via INTRAVENOUS

## 2018-05-05 MED ORDER — HEPARIN BOLUS VIA INFUSION
4000.0000 [IU] | Freq: Once | INTRAVENOUS | Status: AC
  Administered 2018-05-06: 4000 [IU] via INTRAVENOUS
  Filled 2018-05-05: qty 4000

## 2018-05-05 MED ORDER — FUROSEMIDE 10 MG/ML IJ SOLN
20.0000 mg | Freq: Once | INTRAMUSCULAR | Status: AC
  Administered 2018-05-05: 20 mg via INTRAVENOUS
  Filled 2018-05-05: qty 4

## 2018-05-05 MED ORDER — ASPIRIN 81 MG PO CHEW
324.0000 mg | CHEWABLE_TABLET | Freq: Once | ORAL | Status: AC
  Administered 2018-05-05: 324 mg via ORAL
  Filled 2018-05-05: qty 4

## 2018-05-05 NOTE — ED Provider Notes (Signed)
[facility] Emergency Department Provider Note  ____________________________________________  Time seen: Approximately 10:37 PM  I have reviewed the triage vital signs and the nursing notes.   HISTORY  Chief Complaint Shortness of Breath   HPI Jesse Christian is a 48 y.o. male with a history of hepatitis C, diabetes, opioid addiction on methadone, smoking who presents for evaluation of shortness of breath.  Patient reports a week of shortness of breath that is present with exertion and when laying flat and resolves at rest. SOB has been getting progressively worse over the last week.  Patient reports that he has been waking up several times at night because of shortness of breath.  He denies any swelling of his lower extremities.  No family or personal history of congestive heart failure.  He has had no chest pain, no cough, no congestion, no fever or chills.  Patient denies weight gain.  Past Medical History:  Diagnosis Date  . Diabetes mellitus without complication (HCC)   . Drug abuse (HCC)   . Hyperlipidemia   . Right arm fracture 2018    Patient Active Problem List   Diagnosis Date Noted  . Foot infection 09/23/2017  . Benign prostatic hyperplasia with nocturia 06/26/2016  . Anxiety disorder due to medical condition 06/22/2015  . Insomnia 06/22/2015  . Hep C w/o coma, chronic (HCC) 06/10/2015  . Benign hypertensive renal disease 06/08/2015  . History of drug abuse in remission (HCC) 06/08/2015  . Diabetes mellitus with hyperglycemia, with long-term current use of insulin (HCC)   . Hyperlipidemia     History reviewed. No pertinent surgical history.  Prior to Admission medications   Medication Sig Start Date End Date Taking? Authorizing Provider  aspirin 81 MG tablet Take 81 mg by mouth daily.     [provider]  insulin detemir (LEVEMIR) 100 UNIT/ML injection Inject 0.4 mLs (40 Units total) into the skin 2 (two) times daily. 09/25/17    Enid BaasKalisetti, Radhika, MD  lisinopril (PRINIVIL,ZESTRIL) 20 MG tablet Take 1 tablet (20 mg total) by mouth daily. 06/22/17   [name], [name], DO  methadone (DOLOPHINE) 10 MG tablet Take 130 mg by mouth daily.     [provider]  mupirocin cream (BACTROBAN) 2 % Apply topically 2 (two) times daily. 09/25/17   Enid BaasKalisetti, Radhika, MD  NOVOLOG 100 UNIT/ML injection INJECT 0.05-0.15 MLS (5-15 UNITS TOTAL) INTO THE SKIN 3 (THREE) TIMES DAILY. SLIDING SCALE 08/19/17   Olevia PerchesJohnson, [name], DO    Allergies Lyrica [pregabalin]  Family History  Problem Relation Age of Onset  . Mental illness Mother   . Heart attack Father   . Heart disease Father   . Mental illness Sister   . Cancer Maternal Aunt        Lung cancer  . Prostate cancer Neg Hx   . Bladder Cancer Neg Hx   . Kidney cancer Neg Hx     Social History Social History   Tobacco Use  . Smoking status: Current Every Day Smoker    Packs/day: 0.50    Types: Cigarettes  . Smokeless tobacco: Never Used  Substance Use Topics  . Alcohol use: No  . Drug use: No    Review of Systems  Constitutional: Negative for fever. Eyes: Negative for visual changes. ENT: Negative for sore throat. Neck: No neck pain  Cardiovascular: Negative for chest pain. + orthopnea Respiratory: + shortness of breath. Gastrointestinal: Negative for abdominal pain, vomiting or diarrhea. Genitourinary: Negative for dysuria.  Musculoskeletal: Negative for back pain. Skin: Negative for rash. Neurological: Negative for headaches, weakness or numbness. Psych: No SI or HI  ____________________________________________   PHYSICAL EXAM:  VITAL SIGNS: ED Triage Vitals  Enc Vitals Group     BP 05/05/18 1853 (!) 155/96     Pulse Rate 05/05/18 1853 (!) 108     Resp 05/05/18 1853 18     Temp 05/05/18 1853 97.7 F (36.5 C)     Temp Source 05/05/18 1853 Oral     SpO2 05/05/18 1853 96 %     Weight 05/05/18 1855 160 lb (72.6 kg)     Height 05/05/18 1855 6'  (1.829 m)     Head Circumference --      Peak Flow --      Pain Score 05/05/18 1855 0     Pain Loc --      Pain Edu? --      Excl. in GC? --     Constitutional: Alert and oriented. Well appearing and in no apparent distress. HEENT:      Head: Normocephalic and atraumatic.         Eyes: Conjunctivae are normal. Sclera is non-icteric.       Mouth/Throat: Mucous membranes are moist.       Neck: Supple with no signs of meningismus. Cardiovascular: Regular rate and rhythm. No murmurs, gallops, or rubs. 2+ symmetrical distal pulses are present in all extremities. No JVD. Respiratory: Normal respiratory effort.  Diminished air movement bilaterally with no crackles or wheezes  gastrointestinal: Soft, non tender, and non distended with positive bowel sounds. No rebound or guarding. Musculoskeletal: 1+ pitting edema Neurologic: Normal speech and language. Face is symmetric. Moving all extremities. No gross focal neurologic deficits are appreciated. Skin: Skin is warm, dry and intact. No rash noted. Psychiatric: Mood and affect are normal. Speech and behavior are normal.  ____________________________________________   LABS (all labs ordered are listed, but only abnormal results are displayed)  Labs Reviewed  CBC WITH DIFFERENTIAL/PLATELET - Abnormal; Notable for the following components:      Result Value   RBC 3.61 (*)    Hemoglobin 10.1 (*)    HCT 31.1 (*)    All other components within normal limits  COMPREHENSIVE METABOLIC PANEL - Abnormal; Notable for the following components:   Sodium 130 (*)    Glucose, Bld 386 (*)    Calcium 8.6 (*)    Albumin 3.0 (*)    All other components within normal limits  TROPONIN I - Abnormal; Notable for the following components:   Troponin I 1.02 (*)    All other components within normal limits  BRAIN NATRIURETIC PEPTIDE - Abnormal; Notable for the following components:   B Natriuretic Peptide 2,486.0 (*)    All other components within normal  limits   ____________________________________________  EKG  ED ECG REPORT I, Nita Sickle, the attending physician, personally viewed and interpreted this ECG.  Normal sinus rhythm, rate of 99, prolonged QTC, normal axis, T wave inversion in lateral leads, no ST elevation.  Can changes when compared to prior. ____________________________________________  RADIOLOGY  I have personally reviewed the images performed during this visit and I agree with the Radiologist's read.   Interpretation by Radiologist:  Dg Chest 2 View  Result Date: 05/05/2018 CLINICAL DATA:  Shortness of breath EXAM: CHEST - 2 VIEW COMPARISON:  10/06/2014 FINDINGS: Cardiac shadow is stable. Increased vascular congestion is noted with interstitial edema. Patchy infiltrates are noted in the bases with  small effusions. Patchy infiltrate in the right upper lobe is noted as well. No bony abnormality is noted. IMPRESSION: Mild vascular congestion interstitial edema. Bibasilar atelectasis/infiltrate with effusions. Patchy right upper lobe infiltrate is noted as well. Electronically Signed   By: Alcide CleverMark  Lukens M.D.   On: 05/05/2018 19:39     ____________________________________________   PROCEDURES  Procedure(s) performed: None Procedures Critical Care performed:  Yes  CRITICAL CARE Performed by: Nita Sicklearolina Danice Dippolito  ?  Total critical care time: 35 min  Critical care time was exclusive of separately billable procedures and treating other patients.  Critical care was necessary to treat or prevent imminent or life-threatening deterioration.  Critical care was time spent personally by me on the following activities: development of treatment plan with patient and/or surrogate as well as nursing, discussions with consultants, evaluation of patient's response to treatment, examination of patient, obtaining history from patient or surrogate, ordering and performing treatments and interventions, ordering and review of  laboratory studies, ordering and review of radiographic studies, pulse oximetry and re-evaluation of patient's condition.  ____________________________________________   INITIAL IMPRESSION / ASSESSMENT AND PLAN / ED COURSE  48 y.o. male with a history of hepatitis C, diabetes, opioid addiction on methadone, smoking who presents for evaluation of DOE and orthopnea x 1 week.  Patient has normal work of breathing, normal sats, slightly tachycardic, diminished air movement bilaterally with no crackles or wheezes, he has 1+ pitting edema.  Differential diagnoses including CHF versus COPD.  Less likely pneumonia with no fever or cough.  Chest x-ray concerning for interstitial edema.  EKG showing no acute ischemic changes.  Labs are pending. Will give duonebs.    _________________________ 11:53 PM on 05/05/2018 -----------------------------------------  Labs showing elevated BNP at 2486.  Troponin of 1.02.  Presentation concerning for new heart failure and NSTEMI.  Elevated troponin could be due to demand ischemia however with values above 1 I am unable to rule out ischemia at this time therefore will start patient on heparin.  Given lasix. Discussed with Dr. Anne HahnWillis for admission.  As part of my medical decision making, I reviewed the following data within the electronic MEDICAL RECORD NUMBER Nursing notes reviewed and incorporated, Labs reviewed , EKG interpreted , Old EKG reviewed, Old chart reviewed, Radiograph reviewed , Discussed with admitting physician , Notes from prior ED visits and [place] Controlled Substance Database    Pertinent labs & imaging results that were available during my care of the patient were reviewed by me and considered in my medical decision making (see chart for details).    ____________________________________________   FINAL CLINICAL IMPRESSION(S) / ED DIAGNOSES  Final diagnoses:  Acute heart failure, unspecified heart failure type (HCC)  NSTEMI (non-ST elevated  myocardial infarction) (HCC)      NEW MEDICATIONS STARTED DURING THIS VISIT:  ED Discharge Orders    None       Note:  This document was prepared using Dragon voice recognition software and may include unintentional dictation errors.    Don PerkingVeronese, [place], MD 05/05/18 [phone]

## 2018-05-05 NOTE — ED Notes (Signed)
Pt put on 2L nasal cannula O2 per Gregor Hams.

## 2018-05-05 NOTE — ED Triage Notes (Addendum)
Pt arrives with complaints of shortness of breath for the last week. Pt denies pain. Room air saturation 95%, breathing unlabored and regular.. Pt reports OTC decongestant use. Pt denies any other complaints at this time.

## 2018-05-05 NOTE — Progress Notes (Signed)
ANTICOAGULATION CONSULT NOTE - Initial Consult  Pharmacy Consult for heparin drip Indication: chest pain/ACS  Allergies  Allergen Reactions  . Lyrica [Pregabalin] Other (See Comments)    Peripheral edema    Patient Measurements: Height: 6' (182.9 cm) Weight: 160 lb (72.6 kg) IBW/kg (Calculated) : 77.6 Heparin Dosing Weight: 73 kg  Vital Signs: Temp: 98 F (36.7 C) (01/26 2232) Temp Source: Oral (01/26 2232) BP: 152/103 (01/26 2232) Pulse Rate: 99 (01/26 2232)  Labs: Recent Labs    05/05/18 2252  HGB 10.1*  HCT 31.1*  PLT 357  CREATININE 1.14  TROPONINI 1.02*    Estimated Creatinine Clearance: 84 mL/min (by C-G formula based on SCr of 1.14 mg/dL).   Medical History: Past Medical History:  Diagnosis Date  . Diabetes mellitus without complication (HCC)   . Drug abuse (HCC)   . Hyperlipidemia   . Right arm fracture 2018    Medications:  No anticoagulation in PTA meds  Assessment: Trop 1.02  Goal of Therapy:  Heparin level 0.3-0.7 units/ml Monitor platelets by anticoagulation protocol: Yes   Plan:  4000 unit bolus and initial rate of 900 units/hr. First heparin level 6 hours after start of infusion.  Jesse Christian S 05/05/2018,11:57 PM

## 2018-05-06 ENCOUNTER — Encounter: Payer: Self-pay | Admitting: Cardiovascular Disease

## 2018-05-06 ENCOUNTER — Inpatient Hospital Stay
Admit: 2018-05-06 | Discharge: 2018-05-06 | Disposition: A | Discharge status: Home / Self Care | Payer: No Typology Code available for payment source | Attending: Internal Medicine | Admitting: Internal Medicine

## 2018-05-06 ENCOUNTER — Other Ambulatory Visit: Payer: Self-pay

## 2018-05-06 ENCOUNTER — Encounter
Admission: EM | Disposition: A | Discharge status: Other Acute Inpatient Hospital | Payer: Self-pay | Source: Home / Self Care | Attending: Internal Medicine

## 2018-05-06 DIAGNOSIS — N170 Acute kidney failure with tubular necrosis: Secondary | ICD-10-CM | POA: Diagnosis present

## 2018-05-06 DIAGNOSIS — R197 Diarrhea, unspecified: Secondary | ICD-10-CM | POA: Diagnosis not present

## 2018-05-06 DIAGNOSIS — F419 Anxiety disorder, unspecified: Secondary | ICD-10-CM | POA: Diagnosis present

## 2018-05-06 DIAGNOSIS — D638 Anemia in other chronic diseases classified elsewhere: Secondary | ICD-10-CM | POA: Diagnosis present

## 2018-05-06 DIAGNOSIS — F1120 Opioid dependence, uncomplicated: Secondary | ICD-10-CM | POA: Diagnosis present

## 2018-05-06 DIAGNOSIS — E11649 Type 2 diabetes mellitus with hypoglycemia without coma: Secondary | ICD-10-CM | POA: Diagnosis not present

## 2018-05-06 DIAGNOSIS — B1920 Unspecified viral hepatitis C without hepatic coma: Secondary | ICD-10-CM | POA: Diagnosis present

## 2018-05-06 DIAGNOSIS — I214 Non-ST elevation (NSTEMI) myocardial infarction: Secondary | ICD-10-CM | POA: Diagnosis present

## 2018-05-06 DIAGNOSIS — I255 Ischemic cardiomyopathy: Secondary | ICD-10-CM | POA: Diagnosis present

## 2018-05-06 DIAGNOSIS — E785 Hyperlipidemia, unspecified: Secondary | ICD-10-CM | POA: Diagnosis present

## 2018-05-06 DIAGNOSIS — I340 Nonrheumatic mitral (valve) insufficiency: Secondary | ICD-10-CM

## 2018-05-06 DIAGNOSIS — J9601 Acute respiratory failure with hypoxia: Secondary | ICD-10-CM | POA: Diagnosis present

## 2018-05-06 DIAGNOSIS — I5021 Acute systolic (congestive) heart failure: Secondary | ICD-10-CM | POA: Diagnosis present

## 2018-05-06 DIAGNOSIS — I110 Hypertensive heart disease with heart failure: Secondary | ICD-10-CM | POA: Diagnosis present

## 2018-05-06 DIAGNOSIS — E440 Moderate protein-calorie malnutrition: Secondary | ICD-10-CM | POA: Diagnosis present

## 2018-05-06 DIAGNOSIS — I2510 Atherosclerotic heart disease of native coronary artery without angina pectoris: Secondary | ICD-10-CM | POA: Diagnosis present

## 2018-05-06 DIAGNOSIS — I361 Nonrheumatic tricuspid (valve) insufficiency: Secondary | ICD-10-CM

## 2018-05-06 DIAGNOSIS — R570 Cardiogenic shock: Secondary | ICD-10-CM | POA: Diagnosis present

## 2018-05-06 DIAGNOSIS — I509 Heart failure, unspecified: Secondary | ICD-10-CM | POA: Diagnosis present

## 2018-05-06 DIAGNOSIS — I370 Nonrheumatic pulmonary valve stenosis: Secondary | ICD-10-CM

## 2018-05-06 DIAGNOSIS — E1165 Type 2 diabetes mellitus with hyperglycemia: Secondary | ICD-10-CM | POA: Diagnosis present

## 2018-05-06 DIAGNOSIS — E871 Hypo-osmolality and hyponatremia: Secondary | ICD-10-CM | POA: Diagnosis present

## 2018-05-06 HISTORY — PX: LEFT HEART CATH AND CORONARY ANGIOGRAPHY: CATH118249

## 2018-05-06 LAB — GLUCOSE, CAPILLARY
Glucose-Capillary: 332 mg/dL — ABNORMAL HIGH (ref 70–99)
Glucose-Capillary: 248 mg/dL — ABNORMAL HIGH (ref 70–99)
Glucose-Capillary: 203 mg/dL — ABNORMAL HIGH (ref 70–99)
Glucose-Capillary: 257 mg/dL — ABNORMAL HIGH (ref 70–99)
Glucose-Capillary: 408 mg/dL — ABNORMAL HIGH (ref 70–99)
Glucose-Capillary: 341 mg/dL — ABNORMAL HIGH (ref 70–99)
Glucose-Capillary: 77 mg/dL (ref 70–99)

## 2018-05-06 LAB — PROTIME-INR
INR: 1.02
INR: 1
Prothrombin Time: 13.3 seconds (ref 11.4–15.2)
Prothrombin Time: 13.1 seconds (ref 11.4–15.2)

## 2018-05-06 LAB — BASIC METABOLIC PANEL
Anion gap: 9 (ref 5–15)
BUN: 15 mg/dL (ref 6–20)
CO2: 26 mmol/L (ref 22–32)
Calcium: 8.7 mg/dL — ABNORMAL LOW (ref 8.9–10.3)
Chloride: 99 mmol/L (ref 98–111)
Creatinine, Ser: 1.3 mg/dL — ABNORMAL HIGH (ref 0.61–1.24)
GFR calc Af Amer: >60 mL/min (ref >60)
GFR calc non Af Amer: >60 mL/min (ref >60)
GLUCOSE: 283 mg/dL — AB (ref 70–99)
Potassium: 3.4 mmol/L — ABNORMAL LOW (ref 3.5–5.1)
Sodium: 134 mmol/L — ABNORMAL LOW (ref 135–145)

## 2018-05-06 LAB — CBC
HCT: 32.2 % — ABNORMAL LOW (ref 39.0–52.0)
Hemoglobin: 10.3 g/dL — ABNORMAL LOW (ref 13.0–17.0)
MCH: 28.2 pg (ref 26.0–34.0)
MCHC: 32 g/dL (ref 30.0–36.0)
MCV: 88.2 fL (ref 80.0–100.0)
Platelets: 404 10*3/uL — ABNORMAL HIGH (ref 150–400)
RBC: 3.65 MIL/uL — AB (ref 4.22–5.81)
RDW: 12.9 % (ref 11.5–15.5)
WBC: 10.2 10*3/uL (ref 4.0–10.5)
nRBC: 0 % (ref 0.0–0.2)

## 2018-05-06 LAB — HEMOGLOBIN A1C
Hgb A1c MFr Bld: 11.3 % — ABNORMAL HIGH (ref 4.8–5.6)
Mean Plasma Glucose: 277.61 mg/dL

## 2018-05-06 LAB — HEPARIN LEVEL (UNFRACTIONATED)

## 2018-05-06 LAB — TSH: TSH: 0.815 u[IU]/mL (ref 0.350–4.500)

## 2018-05-06 LAB — APTT: aPTT: 38 seconds — ABNORMAL HIGH (ref 24–36)

## 2018-05-06 SURGERY — LEFT HEART CATH AND CORONARY ANGIOGRAPHY
Anesthesia: Moderate Sedation | Laterality: Right

## 2018-05-06 MED ORDER — LISINOPRIL 20 MG PO TABS: 20.0000 mg | ORAL_TABLET | Freq: Every day | ORAL | Status: DC

## 2018-05-06 MED ORDER — SODIUM CHLORIDE 0.9 % WEIGHT BASED INFUSION: 1.0000 mL/kg/h | INTRAVENOUS | Status: DC

## 2018-05-06 MED ORDER — ASPIRIN 81 MG PO CHEW
81.0000 mg | CHEWABLE_TABLET | Freq: Every day | ORAL | Status: DC
  Administered 2018-05-06 – 2018-05-11 (×6): 81 mg via ORAL
  Filled 2018-05-06 (×6): qty 1

## 2018-05-06 MED ORDER — SODIUM CHLORIDE 0.9 % WEIGHT BASED INFUSION: 3.0000 mL/kg/h | INTRAVENOUS | Status: DC

## 2018-05-06 MED ORDER — FENTANYL CITRATE (PF) 100 MCG/2ML IJ SOLN
INTRAMUSCULAR | Status: AC
  Filled 2018-05-06: qty 2

## 2018-05-06 MED ORDER — FUROSEMIDE 10 MG/ML IJ SOLN
40.0000 mg | Freq: Two times a day (BID) | INTRAMUSCULAR | Status: DC
  Administered 2018-05-06 – 2018-05-07 (×2): 40 mg via INTRAVENOUS
  Filled 2018-05-06 (×2): qty 4

## 2018-05-06 MED ORDER — MIDAZOLAM HCL 2 MG/2ML IJ SOLN
INTRAMUSCULAR | Status: AC
  Filled 2018-05-06: qty 2

## 2018-05-06 MED ORDER — ACETAMINOPHEN 650 MG RE SUPP: 650.0000 mg | Freq: Four times a day (QID) | RECTAL | Status: DC | PRN

## 2018-05-06 MED ORDER — SODIUM CHLORIDE 0.9 % WEIGHT BASED INFUSION
1.0000 mL/kg/h | INTRAVENOUS | Status: DC
  Administered 2018-05-06: 1 mL/kg/h via INTRAVENOUS

## 2018-05-06 MED ORDER — INSULIN ASPART 100 UNIT/ML ~~LOC~~ SOLN
0.0000 [IU] | Freq: Three times a day (TID) | SUBCUTANEOUS | Status: DC
  Administered 2018-05-06: 11 [IU] via SUBCUTANEOUS
  Administered 2018-05-06: 15 [IU] via SUBCUTANEOUS
  Administered 2018-05-06 – 2018-05-07 (×2): 11 [IU] via SUBCUTANEOUS
  Administered 2018-05-07: 5 [IU] via SUBCUTANEOUS
  Administered 2018-05-07 – 2018-05-08 (×2): 8 [IU] via SUBCUTANEOUS
  Filled 2018-05-06 (×7): qty 1

## 2018-05-06 MED ORDER — SODIUM CHLORIDE 0.9% FLUSH: 3.0000 mL | INTRAVENOUS | Status: DC | PRN

## 2018-05-06 MED ORDER — ONDANSETRON HCL 4 MG/2ML IJ SOLN
4.0000 mg | Freq: Four times a day (QID) | INTRAMUSCULAR | Status: DC | PRN
  Administered 2018-05-06 – 2018-05-12 (×5): 4 mg via INTRAVENOUS
  Filled 2018-05-06 (×6): qty 2

## 2018-05-06 MED ORDER — HEPARIN (PORCINE) IN NACL 1000-0.9 UT/500ML-% IV SOLN
INTRAVENOUS | Status: AC
  Filled 2018-05-06: qty 1000

## 2018-05-06 MED ORDER — ACETAMINOPHEN 325 MG PO TABS
650.0000 mg | ORAL_TABLET | Freq: Four times a day (QID) | ORAL | Status: DC | PRN
  Administered 2018-05-07: 650 mg via ORAL
  Filled 2018-05-06: qty 2

## 2018-05-06 MED ORDER — IOPAMIDOL (ISOVUE-300) INJECTION 61%
INTRAVENOUS | Status: DC | PRN
  Administered 2018-05-06: 125 mL via INTRA_ARTERIAL

## 2018-05-06 MED ORDER — SODIUM CHLORIDE 0.9% FLUSH
3.0000 mL | INTRAVENOUS | Status: DC | PRN
  Administered 2018-05-06 – 2018-05-10 (×3): 3 mL via INTRAVENOUS
  Filled 2018-05-06 (×3): qty 3

## 2018-05-06 MED ORDER — INSULIN DETEMIR 100 UNIT/ML ~~LOC~~ SOLN
18.0000 [IU] | Freq: Every day | SUBCUTANEOUS | Status: DC
  Filled 2018-05-06 (×2): qty 0.18

## 2018-05-06 MED ORDER — HEPARIN (PORCINE) 25000 UT/250ML-% IV SOLN
INTRAVENOUS | Status: AC
  Administered 2018-05-06: 4000 [IU] via INTRAVENOUS
  Filled 2018-05-06: qty 250

## 2018-05-06 MED ORDER — DOCUSATE SODIUM 100 MG PO CAPS
100.0000 mg | ORAL_CAPSULE | Freq: Two times a day (BID) | ORAL | Status: DC
  Administered 2018-05-06 – 2018-05-11 (×11): 100 mg via ORAL
  Filled 2018-05-06 (×11): qty 1

## 2018-05-06 MED ORDER — FENTANYL CITRATE (PF) 100 MCG/2ML IJ SOLN
INTRAMUSCULAR | Status: DC | PRN
  Administered 2018-05-06: 25 ug via INTRAVENOUS

## 2018-05-06 MED ORDER — METHADONE HCL 10 MG PO TABS
130.0000 mg | ORAL_TABLET | Freq: Every day | ORAL | Status: DC
  Administered 2018-05-06 – 2018-05-11 (×6): 130 mg via ORAL
  Filled 2018-05-06 (×7): qty 13

## 2018-05-06 MED ORDER — ADULT MULTIVITAMIN W/MINERALS CH
1.0000 | ORAL_TABLET | Freq: Every day | ORAL | Status: DC
  Administered 2018-05-07 – 2018-05-11 (×5): 1 via ORAL
  Filled 2018-05-06 (×5): qty 1

## 2018-05-06 MED ORDER — ONDANSETRON HCL 4 MG PO TABS: 4.0000 mg | ORAL_TABLET | Freq: Four times a day (QID) | ORAL | Status: DC | PRN

## 2018-05-06 MED ORDER — ONDANSETRON HCL 4 MG/2ML IJ SOLN
4.0000 mg | Freq: Four times a day (QID) | INTRAMUSCULAR | Status: DC | PRN
  Administered 2018-05-09 (×2): 4 mg via INTRAVENOUS

## 2018-05-06 MED ORDER — MIDAZOLAM HCL 2 MG/2ML IJ SOLN
INTRAMUSCULAR | Status: DC | PRN
  Administered 2018-05-06: 0.5 mg via INTRAVENOUS

## 2018-05-06 MED ORDER — SODIUM CHLORIDE 0.9 % IV SOLN: 250.0000 mL | INTRAVENOUS | Status: DC | PRN

## 2018-05-06 MED ORDER — SACUBITRIL-VALSARTAN 24-26 MG PO TABS: 1.0000 | ORAL_TABLET | Freq: Two times a day (BID) | ORAL | Status: DC

## 2018-05-06 MED ORDER — ATORVASTATIN CALCIUM 20 MG PO TABS
40.0000 mg | ORAL_TABLET | Freq: Every day | ORAL | Status: DC
  Administered 2018-05-06 – 2018-05-11 (×5): 40 mg via ORAL
  Filled 2018-05-06 (×5): qty 2

## 2018-05-06 MED ORDER — ONDANSETRON HCL 4 MG/2ML IJ SOLN
INTRAMUSCULAR | Status: AC
  Administered 2018-05-06: 4 mg via INTRAVENOUS
  Filled 2018-05-06: qty 2

## 2018-05-06 MED ORDER — SODIUM CHLORIDE 0.9% FLUSH: 3.0000 mL | Freq: Two times a day (BID) | INTRAVENOUS | Status: DC

## 2018-05-06 MED ORDER — NICOTINE 7 MG/24HR TD PT24
7.0000 mg | MEDICATED_PATCH | Freq: Every day | TRANSDERMAL | Status: DC
  Administered 2018-05-06 – 2018-05-11 (×6): 7 mg via TRANSDERMAL
  Filled 2018-05-06 (×8): qty 1

## 2018-05-06 MED ORDER — INSULIN ASPART 100 UNIT/ML ~~LOC~~ SOLN
3.0000 [IU] | Freq: Three times a day (TID) | SUBCUTANEOUS | Status: DC
  Administered 2018-05-06 – 2018-05-07 (×3): 3 [IU] via SUBCUTANEOUS
  Filled 2018-05-06 (×3): qty 1

## 2018-05-06 MED ORDER — ACETAMINOPHEN 325 MG PO TABS: 650.0000 mg | ORAL_TABLET | ORAL | Status: DC | PRN

## 2018-05-06 MED ORDER — ASPIRIN 81 MG PO CHEW: 81.0000 mg | CHEWABLE_TABLET | ORAL | Status: DC

## 2018-05-06 MED ORDER — ENSURE MAX PROTEIN PO LIQD
11.0000 [oz_av] | Freq: Two times a day (BID) | ORAL | Status: DC
  Administered 2018-05-06 – 2018-05-10 (×9): 11 [oz_av] via ORAL
  Administered 2018-05-11: 237 mL via ORAL
  Administered 2018-05-11: 11 [oz_av] via ORAL
  Filled 2018-05-06: qty 330

## 2018-05-06 MED ORDER — SODIUM CHLORIDE 0.9% FLUSH
3.0000 mL | Freq: Two times a day (BID) | INTRAVENOUS | Status: DC
  Administered 2018-05-06 – 2018-05-11 (×10): 3 mL via INTRAVENOUS

## 2018-05-06 SURGICAL SUPPLY — 9 items
CATH INFINITI 5FR ANG PIGTAIL (CATHETERS) ×1 IMPLANT
CATH INFINITI 5FR JL4 (CATHETERS) ×1 IMPLANT
CATH INFINITI JR4 5F (CATHETERS) ×1 IMPLANT
KIT MANI 3VAL PERCEP (MISCELLANEOUS) ×2 IMPLANT
NDL PERC 18GX7CM (NEEDLE) IMPLANT
NEEDLE PERC 18GX7CM (NEEDLE) ×2 IMPLANT
PACK CARDIAC CATH (CUSTOM PROCEDURE TRAY) ×2 IMPLANT
SHEATH AVANTI 5FR X 11CM (SHEATH) ×1 IMPLANT
WIRE GUIDERIGHT .035X150 (WIRE) ×1 IMPLANT

## 2018-05-06 NOTE — H&P (Signed)
Jesse Christian is an 48 y.o. male.   Chief Complaint: Shortness of breath HPI: The patient with past medical history of diabetes and drug abuse presents to the emergency department complaining of shortness of breath.  The patient states that for the last 2 weeks he has had orthopnea as well as dyspnea on exertion.  Chest x-ray showed increased vascular congestion as well as possible infiltrate.  The patient denies fevers or sputum production.  He was given Lasix in the emergency department prior to the hospitalist service being called for admission.  Past Medical History:  Diagnosis Date  . Diabetes mellitus without complication (HCC)   . Drug abuse (HCC)   . Hyperlipidemia   . Right arm fracture 2018    History reviewed. No pertinent surgical history. None  Family History  Problem Relation Age of Onset  . Mental illness Mother   . Heart attack Father   . Heart disease Father   . Mental illness Sister   . Cancer Maternal Aunt        Lung cancer  . Prostate cancer Neg Hx   . Bladder Cancer Neg Hx   . Kidney cancer Neg Hx    Social History:  reports that he has been smoking cigarettes. He has been smoking about 0.50 packs per day. He has never used smokeless tobacco. He reports that he does not drink alcohol or use drugs.  Allergies:  Allergies  Allergen Reactions  . Lyrica [Pregabalin] Other (See Comments)    Peripheral edema    Medications Prior to Admission  Medication Sig Dispense Refill  . aspirin 81 MG chewable tablet Chew 81 mg by mouth daily.     . insulin detemir (LEVEMIR) 100 UNIT/ML injection Inject 0.4 mLs (40 Units total) into the skin 2 (two) times daily. (Patient taking differently: Inject 30 Units into the skin 2 (two) times daily. ) 50 mL 1  . insulin regular (NOVOLIN R,HUMULIN R) 100 units/mL injection Inject 5-15 Units into the skin 3 (three) times daily before meals. Per sliding scale    . lisinopril (PRINIVIL,ZESTRIL) 20 MG tablet Take 1 tablet (20 mg  total) by mouth daily. 90 tablet 1  . methadone (DOLOPHINE) 10 MG tablet Take 130 mg by mouth daily.       Results for orders placed or performed during the hospital encounter of 05/05/18 (from the past 48 hour(s))  CBC with Differential/Platelet     Status: Abnormal   Collection Time: 05/05/18 10:52 PM  Result Value Ref Range   WBC 8.5 4.0 - 10.5 K/uL   RBC 3.61 (L) 4.22 - 5.81 MIL/uL   Hemoglobin 10.1 (L) 13.0 - 17.0 g/dL   HCT 28.4 (L) 13.2 - 44.0 %   MCV 86.1 80.0 - 100.0 fL   MCH 28.0 26.0 - 34.0 pg   MCHC 32.5 30.0 - 36.0 g/dL   RDW 10.2 72.5 - 36.6 %   Platelets 357 150 - 400 K/uL   nRBC 0.0 0.0 - 0.2 %   Neutrophils Relative % 64 %   Neutro Abs 5.4 1.7 - 7.7 K/uL   Lymphocytes Relative 28 %   Lymphs Abs 2.4 0.7 - 4.0 K/uL   Monocytes Relative 7 %   Monocytes Absolute 0.6 0.1 - 1.0 K/uL   Eosinophils Relative 1 %   Eosinophils Absolute 0.1 0.0 - 0.5 K/uL   Basophils Relative 0 %   Basophils Absolute 0.0 0.0 - 0.1 K/uL   Immature Granulocytes 0 %   Abs  Immature Granulocytes 0.02 0.00 - 0.07 K/uL    Comment: Performed at [facility], [address] [place], [place] [address]  Comprehensive metabolic panel     Status: Abnormal   Collection Time: 05/05/18 10:52 PM  Result Value Ref Range   Sodium 130 (L) 135 - 145 mmol/L   Potassium 4.2 3.5 - 5.1 mmol/L   Chloride 99 98 - 111 mmol/L   CO2 23 22 - 32 mmol/L   Glucose, Bld 386 (H) 70 - 99 mg/dL   BUN 15 6 - 20 mg/dL   Creatinine, Ser 6.041.14 0.61 - 1.24 mg/dL   Calcium 8.6 (L) 8.9 - 10.3 mg/dL   Total Protein 6.9 6.5 - 8.1 g/dL   Albumin 3.0 (L) 3.5 - 5.0 g/dL   AST 15 15 - 41 U/L   ALT 12 0 - 44 U/L   Alkaline Phosphatase 109 38 - 126 U/L   Total Bilirubin 0.7 0.3 - 1.2 mg/dL   GFR calc non Af Amer >60 >60 mL/min   GFR calc Af Amer >60 >60 mL/min   Anion gap 8 5 - 15    Comment: Performed at [facility], [address] [place], [place] [address]  Troponin I - ONCE - STAT     Status: Abnormal    Collection Time: 05/05/18 10:52 PM  Result Value Ref Range   Troponin I 1.02 (HH) <0.03 ng/mL    Comment: CRITICAL RESULT CALLED TO, READ BACK BY AND VERIFIED WITH REBECA LYNN ON 05/05/18 AT 2333 JAG Performed at [facility], [address] [place], [place] [address]   Brain natriuretic peptide     Status: Abnormal   Collection Time: 05/05/18 10:52 PM  Result Value Ref Range   B Natriuretic Peptide 2,486.0 (H) 0.0 - 100.0 pg/mL    Comment: Performed at [facility], [address] [place], [place] [address]  APTT     Status: Abnormal   Collection Time: 05/05/18 10:52 PM  Result Value Ref Range   aPTT 38 (H) 24 - 36 seconds    Comment:        IF BASELINE aPTT IS ELEVATED, SUGGEST PATIENT RISK ASSESSMENT BE USED TO DETERMINE APPROPRIATE ANTICOAGULANT THERAPY. Performed at [facility], [address] [place], [place] [address]   Protime-INR     Status: None   Collection Time: 05/05/18 10:52 PM  Result Value Ref Range   Prothrombin Time 13.3 11.4 - 15.2 seconds   INR 1.02     Comment: Performed at [facility], [address] [place], [place] [address]  Glucose, capillary     Status: Abnormal   Collection Time: 05/06/18  5:01 AM  Result Value Ref Range   Glucose-Capillary 332 (H) 70 - 99 mg/dL   Comment 1 Notify RN    Comment 2 Document in Chart    Dg Chest 2 View  Result Date: 05/05/2018 CLINICAL DATA:  Shortness of breath EXAM: CHEST - 2 VIEW COMPARISON:  10/06/2014 FINDINGS: Cardiac shadow is stable. Increased vascular congestion is noted with interstitial edema. Patchy infiltrates are noted in the bases with small effusions. Patchy infiltrate in the right upper lobe is noted as well. No bony abnormality is noted. IMPRESSION: Mild vascular congestion interstitial edema. Bibasilar atelectasis/infiltrate with effusions. Patchy right upper lobe infiltrate is noted as well. Electronically Signed   By: Alcide CleverMark  Lukens M.D.   On: 05/05/2018  19:39    Review of Systems  Constitutional: Negative for chills and  fever.  HENT: Negative for sore throat and tinnitus.   Eyes: Negative for blurred vision and redness.  Respiratory: Positive for shortness of breath. Negative for cough.   Cardiovascular: Positive for orthopnea. Negative for chest pain, palpitations and PND.  Gastrointestinal: Negative for abdominal pain, diarrhea, nausea and vomiting.  Genitourinary: Negative for dysuria, frequency and urgency.  Musculoskeletal: Negative for joint pain and myalgias.  Skin: Negative for rash.       No lesions  Neurological: Negative for speech change, focal weakness and weakness.  Endo/Heme/Allergies: Does not bruise/bleed easily.       No temperature intolerance  Psychiatric/Behavioral: Negative for depression and suicidal ideas.    Blood pressure (!) 145/94, pulse 100, temperature 98.5 F (36.9 C), temperature source Oral, resp. rate 14, height 6' (1.829 m), weight 72.6 kg, SpO2 95 %. Physical Exam  Vitals reviewed. Constitutional: He is oriented to person, place, and time. He appears well-developed and well-nourished. No distress.  HENT:  Head: Normocephalic and atraumatic.  Mouth/Throat: Oropharynx is clear and moist.  Eyes: Pupils are equal, round, and reactive to light. Conjunctivae and EOM are normal. No scleral icterus.  Neck: Normal range of motion. Neck supple. No JVD present. No tracheal deviation present. No thyromegaly present.  Cardiovascular: Normal rate, regular rhythm and normal heart sounds. Exam reveals no gallop and no friction rub.  No murmur heard. Respiratory: Effort normal and breath sounds normal. No respiratory distress.  GI: Soft. Bowel sounds are normal. He exhibits no distension. There is no abdominal tenderness.  Genitourinary:    Genitourinary Comments: Deferred   Musculoskeletal: Normal range of motion.        General: No edema.  Lymphadenopathy:    He has no cervical adenopathy.  Neurological:  He is alert and oriented to person, place, and time. No cranial nerve deficit.  Skin: Skin is warm and dry. No rash noted. No erythema.  Psychiatric: He has a normal mood and affect. His behavior is normal. Judgment and thought content normal.     Assessment/Plan This is a 48 year old male admitted for new onset heart failure. 1.  CHF: Due to; systolic.  Continue diuresis.  Consult cardiology.  Obtain echocardiogram. 2.  NSTEMI: Troponin is elevated likely due to demand ischemia however we have started the patient on heparin until further evaluation from cardiology. 3.  Diabetes mellitus type 2: Continue basal insulin in addition to sliding scale while hospitalized 4.  Hypertension: Controlled; continue lisinopril 5.  Hyperlipidemia: Continue statin therapy 6.  Opiate addiction: Continue methadone as directed by treatment facility 7.  DVT prophylaxis: Therapeutic anticoagulation 8.  GI prophylaxis: None The patient is a full code.  Time spent on admission orders and patient care approximately 45 minutes  Arnaldo Natal, MD 05/06/2018, 7:29 AM

## 2018-05-06 NOTE — Consult Note (Signed)
Jesse Christian is a 48 y.o. male  [id number]  Primary Cardiologist: Adrian Blackwater Reason for Consultation: Non-STEMI  HPI: This is a 49 year old white male with history of drug abuse diabetes hyperlipidemia presented to the hospital with severe episode of shortness of breath and had elevated troponin over 1.0 and I was asked to evaluate the patient.   Review of Systems: Patient denies any chest pain but has severe shortness of breath at rest.   Past Medical History:  Diagnosis Date  . Diabetes mellitus without complication (HCC)   . Drug abuse (HCC)   . Hyperlipidemia   . Right arm fracture 2018    Medications Prior to Admission  Medication Sig Dispense Refill  . aspirin 81 MG chewable tablet Chew 81 mg by mouth daily.     . insulin detemir (LEVEMIR) 100 UNIT/ML injection Inject 0.4 mLs (40 Units total) into the skin 2 (two) times daily. (Patient taking differently: Inject 30 Units into the skin 2 (two) times daily. ) 50 mL 1  . insulin regular (NOVOLIN R,HUMULIN R) 100 units/mL injection Inject 5-15 Units into the skin 3 (three) times daily before meals. Per sliding scale    . lisinopril (PRINIVIL,ZESTRIL) 20 MG tablet Take 1 tablet (20 mg total) by mouth daily. 90 tablet 1  . methadone (DOLOPHINE) 10 MG tablet Take 130 mg by mouth daily.        Mitzi Hansen Hold] aspirin  81 mg Oral Daily  . [START ON 05/07/2018] aspirin  81 mg Oral Pre-Cath  . [MAR Hold] docusate sodium  100 mg Oral BID  . [MAR Hold] insulin aspart  0-15 Units Subcutaneous TID WC  . [MAR Hold] insulin detemir  18 Units Subcutaneous QHS  . [MAR Hold] lisinopril  20 mg Oral Daily  . [MAR Hold] methadone  130 mg Oral Daily  . sodium chloride flush  3 mL Intravenous Q12H    Infusions: . sodium chloride    . [START ON 05/07/2018] sodium chloride     Followed by  . [START ON 05/07/2018] sodium chloride 1 mL/kg/hr (05/06/18 0906)  . heparin 900 Units/hr (05/06/18 0113)    Allergies  Allergen Reactions  .  Lyrica [Pregabalin] Other (See Comments)    Peripheral edema    Social History   Socioeconomic History  . Marital status: Single    Spouse name: Not on file  . Number of children: Not on file  . Years of education: Not on file  . Highest education level: Not on file  Occupational History  . Not on file  Social Needs  . Financial resource strain: Not on file  . Food insecurity:    Worry: Not on file    Inability: Not on file  . Transportation needs:    Medical: Not on file    Non-medical: Not on file  Tobacco Use  . Smoking status: Current Every Day Smoker    Packs/day: 0.50    Types: Cigarettes  . Smokeless tobacco: Never Used  Substance and Sexual Activity  . Alcohol use: No  . Drug use: No  . Sexual activity: Not on file  Lifestyle  . Physical activity:    Days per week: Not on file    Minutes per session: Not on file  . Stress: Not on file  Relationships  . Social connections:    Talks on phone: Not on file    Gets together: Not on file    Attends religious service: Not on file  Active member of club or organization: Not on file    Attends meetings of clubs or organizations: Not on file    Relationship status: Not on file  . Intimate partner violence:    Fear of current or ex partner: Not on file    Emotionally abused: Not on file    Physically abused: Not on file    Forced sexual activity: Not on file  Other Topics Concern  . Not on file  Social History Narrative  . Not on file    Family History  Problem Relation Age of Onset  . Mental illness Mother   . Heart attack Father   . Heart disease Father   . Mental illness Sister   . Cancer Maternal Aunt        Lung cancer  . Prostate cancer Neg Hx   . Bladder Cancer Neg Hx   . Kidney cancer Neg Hx     PHYSICAL EXAM: Vitals:   05/06/18 0745 05/06/18 0852  BP: (!) 147/99   Pulse: 100 (!) 104  Resp: 18 16  Temp: 98.7 F (37.1 C) 98.9 F (37.2 C)  SpO2: 95% 94%     Intake/Output Summary  (Last 24 hours) at 05/06/2018 1016 Last data filed at 05/06/2018 0700 Gross per 24 hour  Intake 91.5 ml  Output 720 ml  Net -628.5 ml    General:  Well appearing. No respiratory difficulty HEENT: normal Neck: supple. no JVD. Carotids 2+ bilat; no bruits. No lymphadenopathy or thryomegaly appreciated. Cor: PMI nondisplaced. Regular rate & rhythm. No rubs, gallops or murmurs. Lungs: clear Abdomen: soft, nontender, nondistended. No hepatosplenomegaly. No bruits or masses. Good bowel sounds. Extremities: no cyanosis, clubbing, rash, edema Neuro: alert & oriented x 3, cranial nerves grossly intact. moves all 4 extremities w/o difficulty. Affect pleasant.  ECG: Sinus rhythm with nonspecific ST-T changes and poor R wave progression and nonspecific interventricular conduction delay.  Results for orders placed or performed during the hospital encounter of 05/05/18 (from the past 24 hour(s))  CBC with Differential/Platelet     Status: Abnormal   Collection Time: 05/05/18 10:52 PM  Result Value Ref Range   WBC 8.5 4.0 - 10.5 K/uL   RBC 3.61 (L) 4.22 - 5.81 MIL/uL   Hemoglobin 10.1 (L) 13.0 - 17.0 g/dL   HCT 16.131.1 (L) 09.639.0 - 04.552.0 %   MCV 86.1 80.0 - 100.0 fL   MCH 28.0 26.0 - 34.0 pg   MCHC 32.5 30.0 - 36.0 g/dL   RDW 40.912.7 81.111.5 - 91.415.5 %   Platelets 357 150 - 400 K/uL   nRBC 0.0 0.0 - 0.2 %   Neutrophils Relative % 64 %   Neutro Abs 5.4 1.7 - 7.7 K/uL   Lymphocytes Relative 28 %   Lymphs Abs 2.4 0.7 - 4.0 K/uL   Monocytes Relative 7 %   Monocytes Absolute 0.6 0.1 - 1.0 K/uL   Eosinophils Relative 1 %   Eosinophils Absolute 0.1 0.0 - 0.5 K/uL   Basophils Relative 0 %   Basophils Absolute 0.0 0.0 - 0.1 K/uL   Immature Granulocytes 0 %   Abs Immature Granulocytes 0.02 0.00 - 0.07 K/uL  Comprehensive metabolic panel     Status: Abnormal   Collection Time: 05/05/18 10:52 PM  Result Value Ref Range   Sodium 130 (L) 135 - 145 mmol/L   Potassium 4.2 3.5 - 5.1 mmol/L   Chloride 99 98 - 111  mmol/L   CO2 23 22 - 32 mmol/L  Glucose, Bld 386 (H) 70 - 99 mg/dL   BUN 15 6 - 20 mg/dL   Creatinine, Ser 4.09 0.61 - 1.24 mg/dL   Calcium 8.6 (L) 8.9 - 10.3 mg/dL   Total Protein 6.9 6.5 - 8.1 g/dL   Albumin 3.0 (L) 3.5 - 5.0 g/dL   AST 15 15 - 41 U/L   ALT 12 0 - 44 U/L   Alkaline Phosphatase 109 38 - 126 U/L   Total Bilirubin 0.7 0.3 - 1.2 mg/dL   GFR calc non Af Amer >60 >60 mL/min   GFR calc Af Amer >60 >60 mL/min   Anion gap 8 5 - 15  Troponin I - ONCE - STAT     Status: Abnormal   Collection Time: 05/05/18 10:52 PM  Result Value Ref Range   Troponin I 1.02 (HH) <0.03 ng/mL  Brain natriuretic peptide     Status: Abnormal   Collection Time: 05/05/18 10:52 PM  Result Value Ref Range   B Natriuretic Peptide 2,486.0 (H) 0.0 - 100.0 pg/mL  APTT     Status: Abnormal   Collection Time: 05/05/18 10:52 PM  Result Value Ref Range   aPTT 38 (H) 24 - 36 seconds  Protime-INR     Status: None   Collection Time: 05/05/18 10:52 PM  Result Value Ref Range   Prothrombin Time 13.3 11.4 - 15.2 seconds   INR 1.02   Glucose, capillary     Status: Abnormal   Collection Time: 05/06/18  5:01 AM  Result Value Ref Range   Glucose-Capillary 332 (H) 70 - 99 mg/dL   Comment 1 Notify RN    Comment 2 Document in Chart   Heparin level (unfractionated)     Status: Abnormal   Collection Time: 05/06/18  7:24 AM  Result Value Ref Range   Heparin Unfractionated <0.10 (L) 0.30 - 0.70 IU/mL  CBC     Status: Abnormal   Collection Time: 05/06/18  7:24 AM  Result Value Ref Range   WBC 10.2 4.0 - 10.5 K/uL   RBC 3.65 (L) 4.22 - 5.81 MIL/uL   Hemoglobin 10.3 (L) 13.0 - 17.0 g/dL   HCT 81.1 (L) 91.4 - 78.2 %   MCV 88.2 80.0 - 100.0 fL   MCH 28.2 26.0 - 34.0 pg   MCHC 32.0 30.0 - 36.0 g/dL   RDW 95.6 21.3 - 08.6 %   Platelets 404 (H) 150 - 400 K/uL   nRBC 0.0 0.0 - 0.2 %  TSH     Status: None   Collection Time: 05/06/18  7:24 AM  Result Value Ref Range   TSH 0.815 0.350 - 4.500 uIU/mL  Basic  metabolic panel     Status: Abnormal   Collection Time: 05/06/18  7:24 AM  Result Value Ref Range   Sodium 134 (L) 135 - 145 mmol/L   Potassium 3.4 (L) 3.5 - 5.1 mmol/L   Chloride 99 98 - 111 mmol/L   CO2 26 22 - 32 mmol/L   Glucose, Bld 283 (H) 70 - 99 mg/dL   BUN 15 6 - 20 mg/dL   Creatinine, Ser 5.78 (H) 0.61 - 1.24 mg/dL   Calcium 8.7 (L) 8.9 - 10.3 mg/dL   GFR calc non Af Amer >60 >60 mL/min   GFR calc Af Amer >60 >60 mL/min   Anion gap 9 5 - 15  Protime-INR     Status: None   Collection Time: 05/06/18  7:24 AM  Result Value Ref Range  Prothrombin Time 13.1 11.4 - 15.2 seconds   INR 1.00   Glucose, capillary     Status: Abnormal   Collection Time: 05/06/18  7:36 AM  Result Value Ref Range   Glucose-Capillary 248 (H) 70 - 99 mg/dL   Comment 1 Notify RN    Comment 2 Document in Chart   Glucose, capillary     Status: Abnormal   Collection Time: 05/06/18  9:01 AM  Result Value Ref Range   Glucose-Capillary 203 (H) 70 - 99 mg/dL   Dg Chest 2 View  Result Date: 05/05/2018 CLINICAL DATA:  Shortness of breath EXAM: CHEST - 2 VIEW COMPARISON:  10/06/2014 FINDINGS: Cardiac shadow is stable. Increased vascular congestion is noted with interstitial edema. Patchy infiltrates are noted in the bases with small effusions. Patchy infiltrate in the right upper lobe is noted as well. No bony abnormality is noted. IMPRESSION: Mild vascular congestion interstitial edema. Bibasilar atelectasis/infiltrate with effusions. Patchy right upper lobe infiltrate is noted as well. Electronically Signed   By: Alcide CleverMark  Lukens M.D.   On: 05/05/2018 19:39     ASSESSMENT AND PLAN: Non-STEMI with severe episode of shortness of breath and risk factor for coronary artery disease and elevated troponin abnormal EKG.  Advise left heart catheterization by radial approach and patient was explained risk and benefits and patient has agreed to the procedure.  Bristal Steffy A

## 2018-05-06 NOTE — ED Notes (Signed)
ED TO INPATIENT HANDOFF REPORT  Name/Age/Gender Jesse Christian L Weissinger 48 y.o. male  Code Status Code Status History    Date Active Date Inactive Code Status Order ID Comments User Context   09/23/2017 1515 09/25/2017 1750 Full Code [id number]  Ramonita LabGouru, Aruna, MD Inpatient   12/26/2015 2110 12/27/2015 1954 Full Code [id number]  Altamese DillingVachhani, Vaibhavkumar, MD Inpatient      Home/SNF/Other Home  Chief Complaint Breathing Trouble  Level of Care/Admitting Diagnosis ED Disposition    ED Disposition Condition Comment   Admit  [facility] [[id number]]  Level of Care: Telemetry [5]  Diagnosis: Acute systolic heart failure (HCC) [428.21.ICD-9-CM]  Admitting Physician: Arnaldo NatalDIAMOND, MICHAEL S [[id number]]  Attending Physician: Arnaldo NatalDIAMOND, MICHAEL S [[id number]]  Estimated length of stay: past midnight tomorrow  Certification:: I certify this patient will need inpatient services for at least 2 midnights  PT Class (Do Not Modify): Inpatient [101]  PT Acc Code (Do Not Modify): Private [1]       Medical History Past Medical History:  Diagnosis Date  . Diabetes mellitus without complication (HCC)   . Drug abuse (HCC)   . Hyperlipidemia   . Right arm fracture 2018    Allergies Allergies  Allergen Reactions  . Lyrica [Pregabalin] Other (See Comments)    Peripheral edema    IV Location/Drains/Wounds Patient Lines/Drains/Airways Status   Active Line/Drains/Airways    Name:   Placement date:   Placement time:   Site:   Days:   Peripheral IV 05/06/18 Right Antecubital   05/06/18    0100    Antecubital   less than 1   Peripheral IV 05/06/18 Left Antecubital   05/06/18    0058    Antecubital   less than 1   Wound / Incision (Open or Dehisced) 09/23/17 Venous stasis ulcer Toe (Comment  which one) Left   09/23/17    1524    Toe (Comment  which one)   225   Wound / Incision (Open or Dehisced) 09/23/17 Venous stasis ulcer Foot Left;Posterior   09/23/17    1525    Foot   225           Labs/Imaging Results for orders placed or performed during the hospital encounter of 05/05/18 (from the past 48 hour(s))  CBC with Differential/Platelet     Status: Abnormal   Collection Time: 05/05/18 10:52 PM  Result Value Ref Range   WBC 8.5 4.0 - 10.5 K/uL   RBC 3.61 (L) 4.22 - 5.81 MIL/uL   Hemoglobin 10.1 (L) 13.0 - 17.0 g/dL   HCT 02.731.1 (L) 25.339.0 - 66.452.0 %   MCV 86.1 80.0 - 100.0 fL   MCH 28.0 26.0 - 34.0 pg   MCHC 32.5 30.0 - 36.0 g/dL   RDW 40.312.7 47.411.5 - 25.915.5 %   Platelets 357 150 - 400 K/uL   nRBC 0.0 0.0 - 0.2 %   Neutrophils Relative % 64 %   Neutro Abs 5.4 1.7 - 7.7 K/uL   Lymphocytes Relative 28 %   Lymphs Abs 2.4 0.7 - 4.0 K/uL   Monocytes Relative 7 %   Monocytes Absolute 0.6 0.1 - 1.0 K/uL   Eosinophils Relative 1 %   Eosinophils Absolute 0.1 0.0 - 0.5 K/uL   Basophils Relative 0 %   Basophils Absolute 0.0 0.0 - 0.1 K/uL   Immature Granulocytes 0 %   Abs Immature Granulocytes 0.02 0.00 - 0.07 K/uL    Comment: Performed at [facility], [address] [place], [place] [address]  Comprehensive metabolic panel     Status: Abnormal   Collection Time: 05/05/18 10:52 PM  Result Value Ref Range   Sodium 130 (L) 135 - 145 mmol/L   Potassium 4.2 3.5 - 5.1 mmol/L   Chloride 99 98 - 111 mmol/L   CO2 23 22 - 32 mmol/L   Glucose, Bld 386 (H) 70 - 99 mg/dL   BUN 15 6 - 20 mg/dL   Creatinine, Ser 9.37 0.61 - 1.24 mg/dL   Calcium 8.6 (L) 8.9 - 10.3 mg/dL   Total Protein 6.9 6.5 - 8.1 g/dL   Albumin 3.0 (L) 3.5 - 5.0 g/dL   AST 15 15 - 41 U/L   ALT 12 0 - 44 U/L   Alkaline Phosphatase 109 38 - 126 U/L   Total Bilirubin 0.7 0.3 - 1.2 mg/dL   GFR calc non Af Amer >60 >60 mL/min   GFR calc Af Amer >60 >60 mL/min   Anion gap 8 5 - 15    Comment: Performed at [facility], [address] [place], [place] [address]  Troponin I - ONCE - STAT     Status: Abnormal   Collection Time: 05/05/18 10:52 PM  Result Value Ref Range   Troponin I 1.02 (HH)  <0.03 ng/mL    Comment: CRITICAL RESULT CALLED TO, READ BACK BY AND VERIFIED WITH REBECA Zoila Ditullio ON 05/05/18 AT 2333 JAG Performed at [facility], [address] [place], [place] [address]   Brain natriuretic peptide     Status: Abnormal   Collection Time: 05/05/18 10:52 PM  Result Value Ref Range   B Natriuretic Peptide 2,486.0 (H) 0.0 - 100.0 pg/mL    Comment: Performed at [facility], [address] [place], [place] [address]  APTT     Status: Abnormal   Collection Time: 05/05/18 10:52 PM  Result Value Ref Range   aPTT 38 (H) 24 - 36 seconds    Comment:        IF BASELINE aPTT IS ELEVATED, SUGGEST PATIENT RISK ASSESSMENT BE USED TO DETERMINE APPROPRIATE ANTICOAGULANT THERAPY. Performed at [facility], [address] [place], [place] [address]   Protime-INR     Status: None   Collection Time: 05/05/18 10:52 PM  Result Value Ref Range   Prothrombin Time 13.3 11.4 - 15.2 seconds   INR 1.02     Comment: Performed at [facility], [address] [place], [place] [address]   Dg Chest 2 View  Result Date: 05/05/2018 CLINICAL DATA:  Shortness of breath EXAM: CHEST - 2 VIEW COMPARISON:  10/06/2014 FINDINGS: Cardiac shadow is stable. Increased vascular congestion is noted with interstitial edema. Patchy infiltrates are noted in the bases with small effusions. Patchy infiltrate in the right upper lobe is noted as well. No bony abnormality is noted. IMPRESSION: Mild vascular congestion interstitial edema. Bibasilar atelectasis/infiltrate with effusions. Patchy right upper lobe infiltrate is noted as well. Electronically Signed   By: Alcide Clever M.D.   On: 05/05/2018 19:39    Pending Labs Unresulted Labs (From admission, onward)    Start     Ordered   05/06/18 0730  Heparin level (unfractionated)  Once-Timed,   STAT     05/06/18 0244   05/06/18 0730  CBC  Once-Timed,   STAT     05/06/18 0244   Signed and Held  Creatinine, serum  (enoxaparin  (LOVENOX)    CrCl >/= 30 ml/min)  Weekly,  R    Comments:  while on enoxaparin therapy    Signed and Held   Signed and Held  TSH  Add-on,   R     Signed and Held   Signed and Held  Hemoglobin A1c  Add-on,   R     Signed and Held          Vitals/Pain Today's Vitals   05/06/18 0130 05/06/18 0200 05/06/18 0230 05/06/18 0300  BP: 138/89 139/88 135/86 138/90  Pulse: 99 99 98 99  Resp: 14 16 13 14   Temp:      TempSrc:      SpO2: 94% 96% 96% 96%  Weight:      Height:      PainSc:        Isolation Precautions No active isolations  Medications Medications  heparin ADULT infusion 100 units/mL (25000 units/24550mL sodium chloride 0.45%) (900 Units/hr Intravenous New Bag/Given 05/06/18 0113)  ipratropium-albuterol (DUONEB) 0.5-2.5 (3) MG/3ML nebulizer solution 3 mL (3 mLs Nebulization Given 05/05/18 2247)  ipratropium-albuterol (DUONEB) 0.5-2.5 (3) MG/3ML nebulizer solution 3 mL (3 mLs Nebulization Given 05/05/18 2247)  furosemide (LASIX) injection 20 mg (20 mg Intravenous Given 05/05/18 2359)  aspirin chewable tablet 324 mg (324 mg Oral Given 05/05/18 2359)  heparin bolus via infusion 4,000 Units (4,000 Units Intravenous Bolus from Bag 05/06/18 0114)    Mobility walks

## 2018-05-06 NOTE — Progress Notes (Signed)
Dr. Imogene Burn notified of CBG of 408, per Dr. Imogene Burn will put orders in. Will continue to monitor.

## 2018-05-06 NOTE — Progress Notes (Signed)
Initial Nutrition Assessment  DOCUMENTATION CODES:   Non-severe (moderate) malnutrition in context of chronic illness  INTERVENTION:   Ensure Max protein supplement BID, each supplement provides 150kcal and 30g of protein.  MVI daily   Liberalize diet   Low sodium/ carbohydrate controlled diet education   NUTRITION DIAGNOSIS:   Moderate Malnutrition related to chronic illness(uncontrolled DM, CHF, substance abuse ) as evidenced by moderate fat depletion, moderate muscle depletion.  GOAL:   Patient will meet greater than or equal to 90% of their needs  MONITOR:   PO intake, Supplement acceptance, Labs, Weight trends, Skin, I & O's  REASON FOR ASSESSMENT:   Consult Diet education  ASSESSMENT:   48 year old white male with history of drug abuse, diabetes, hyperlipidemia, presented to the hospital for NSTEMI and CHF  Met with pt in room today. Pt reports good appetite and oral intake at baseline but reports eating small, frequent meals. Family member at bedside reports "he eats like a bird". Pt does enjoy drinking supplements and is willing to try Ensure Max in hospital. Pt's lunch tray was sitting on side table today with <25% eaten from it. Pt reports he used to be a fairly big guy until he got diabetes and reports he lost a large amount of weight. Per chart, pt has been weight stable for the past year. RD will order supplements and MVI to help pt meet his estimated protein needs. Will liberalize the heart healthy portion of pt's diet as this restricts protein. Pt provided with low sodium/carbohydrate controlled diet education today.   RD provided "Low Sodium Nutrition Therapy" handout from the Academy of [employer] and Dietetics. Reviewed patient's dietary recall. Provided examples on ways to decrease sodium intake in diet. Discouraged intake of processed foods and use of salt shaker. Encouraged fresh fruits and vegetables as well as whole grain sources of carbohydrates to  maximize fiber intake.   Medications reviewed and include: aspirin, colace, lasix, insulin, methadone   Labs reviewed: Na 134(L), K 3.4(L), creat 1.30(H) BNP 2486(H)- 1/26 Hgb 10.3(L), Hct 32.2(L) cbgs- 332, 248, 203, 257, 408 x 24 hrs AIC 11.3(H)- 1/27  NUTRITION - FOCUSED PHYSICAL EXAM:    Most Recent Value  Orbital Region  Mild depletion  Upper Arm Region  Moderate depletion  Thoracic and Lumbar Region  Mild depletion  Buccal Region  Moderate depletion  Temple Region  Mild depletion  Clavicle Bone Region  Moderate depletion  Clavicle and Acromion Bone Region  Moderate depletion  Scapular Bone Region  Moderate depletion  Dorsal Hand  Mild depletion  Patellar Region  Moderate depletion  Anterior Thigh Region  Moderate depletion  Posterior Calf Region  Moderate depletion  Edema (RD Assessment)  None  Hair  Reviewed  Eyes  Reviewed  Mouth  Reviewed  Skin  Reviewed  Nails  Reviewed     Diet Order:   Diet Order            Diet Carb Modified Fluid consistency: Thin; Room service appropriate? Yes; Fluid restriction: 1200 mL Fluid  Diet effective now             EDUCATION NEEDS:   Education needs have been addressed  Skin:  Skin Assessment: Reviewed RN Assessment  Last BM:  1/26  Height:   Ht Readings from Last 1 Encounters:  05/06/18 6' (1.829 m)    Weight:   Wt Readings from Last 1 Encounters:  05/06/18 73.9 kg    Ideal Body Weight:  80.9 kg  BMI:  Body mass index is 22.11 kg/m.  Estimated Nutritional Needs:   Kcal:  2200-2500kcal/day   Protein:  95-110g/day   Fluid:  >2.2L/day or per MD   Koleen Distance MS, RD, LDN Pager #- [phone] Office#- [phone] After Hours Pager: [phone]

## 2018-05-06 NOTE — Progress Notes (Signed)
ANTICOAGULATION CONSULT NOTE - Initial Consult  Pharmacy Consult for heparin drip Indication: chest pain/ACS  Allergies  Allergen Reactions  . Lyrica [Pregabalin] Other (See Comments)    Peripheral edema    Patient Measurements: Height: 6' (182.9 cm) Weight: 163 lb (73.9 kg) IBW/kg (Calculated) : 77.6 Heparin Dosing Weight: 73 kg  Vital Signs: Temp: 98.9 F (37.2 C) (01/27 0852) Temp Source: Oral (01/27 0852) BP: 134/89 (01/27 1300) Pulse Rate: 100 (01/27 1300)  Labs: Recent Labs    05/05/18 2252 05/06/18 0724  HGB 10.1* 10.3*  HCT 31.1* 32.2*  PLT 357 404*  APTT 38*  --   LABPROT 13.3 13.1  INR 1.02 1.00  HEPARINUNFRC  --  <0.10*  CREATININE 1.14 1.30*  TROPONINI 1.02*  --     Estimated Creatinine Clearance: 75 mL/min (A) (by C-G formula based on SCr of 1.3 mg/dL (H)).   Medical History: Past Medical History:  Diagnosis Date  . Diabetes mellitus without complication (HCC)   . Drug abuse (HCC)   . Hyperlipidemia   . Right arm fracture 2018    Medications:  No anticoagulation in PTA meds  Assessment: Trop 1.02  HEPARIN COURSE DATE TIME HL NOTES 1/27 0113 -- Started infusion 900 units/hr 1/27 0724 <0.1 Seems heparin has been held for cath. Will follow up heparin plan post cath.  Goal of Therapy:  Heparin level 0.3-0.7 units/ml Monitor platelets by anticoagulation protocol: Yes   Plan:  Heparin level subtherapeutic. Seems infusion has been held prior to procedure. Follow up post cath.   Carola Frost, PharmD, BCPS Clinical Pharmacist 05/06/2018,1:11 PM

## 2018-05-06 NOTE — Progress Notes (Signed)
*  PRELIMINARY RESULTS* Echocardiogram 2D Echocardiogram has been performed.  Jesse Christian 05/06/2018, 1:12 PM

## 2018-05-06 NOTE — Progress Notes (Signed)
Pt returns from cardiac cath, showing 3 vessel disease .Right groin looks clean, dry, intact, covered with gauze and Tegaderm. VSS, no complains of pain at this time. Will continue to monitor.

## 2018-05-06 NOTE — Progress Notes (Signed)
Patient is a status posterior cardiac cath which show multivessel disease. He has no complaints after cardiac cath. Vital signs and lab reviewed. Physical examinations is unremarkable. This is a 48 year old male admitted for new onset heart failure. 1.    Acute systolic CHF: Continue IV Lasix and follow-up echo. 2.  NSTEMI due to three-vessel CAD.   Consider CABG once he is medically stable per Dr. Welton Flakes. 3.  Diabetes mellitus type 2: Continue basal insulin in addition to sliding scale while hospitalized 4.  Hypertension: Controlled; continue lisinopril 5.  Hyperlipidemia: Continue statin therapy 6.  Opiate addiction: Continue methadone as directed by treatment facility. Tobacco abuse.  Smoking cessation was counseled for 3 to 4 minutes.  The patient wants to quit smoking.  I discussed with the patient and RNs in Cath Lab.  Time spent about 33 minutes.

## 2018-05-07 DIAGNOSIS — E440 Moderate protein-calorie malnutrition: Secondary | ICD-10-CM

## 2018-05-07 LAB — GLUCOSE, CAPILLARY
Glucose-Capillary: 228 mg/dL — ABNORMAL HIGH (ref 70–99)
Glucose-Capillary: 346 mg/dL — ABNORMAL HIGH (ref 70–99)
Glucose-Capillary: 264 mg/dL — ABNORMAL HIGH (ref 70–99)
Glucose-Capillary: 109 mg/dL — ABNORMAL HIGH (ref 70–99)

## 2018-05-07 LAB — BASIC METABOLIC PANEL
Anion gap: 11 (ref 5–15)
BUN: 20 mg/dL (ref 6–20)
CALCIUM: 8.6 mg/dL — AB (ref 8.9–10.3)
CO2: 26 mmol/L (ref 22–32)
Chloride: 96 mmol/L — ABNORMAL LOW (ref 98–111)
Creatinine, Ser: 1.46 mg/dL — ABNORMAL HIGH (ref 0.61–1.24)
GFR calc Af Amer: >60 mL/min (ref >60)
GFR calc non Af Amer: 57 mL/min — ABNORMAL LOW (ref >60)
Glucose, Bld: 195 mg/dL — ABNORMAL HIGH (ref 70–99)
Potassium: 4.3 mmol/L (ref 3.5–5.1)
Sodium: 133 mmol/L — ABNORMAL LOW (ref 135–145)

## 2018-05-07 LAB — ECHOCARDIOGRAM COMPLETE
Height: 72 in
Weight: 2608 oz

## 2018-05-07 LAB — LIPID PANEL
Cholesterol: 193 mg/dL (ref 0–200)
HDL: 41 mg/dL (ref >40)
LDL Cholesterol: 122 mg/dL — ABNORMAL HIGH (ref 0–99)
Total CHOL/HDL Ratio: 4.7 RATIO
Triglycerides: 151 mg/dL — ABNORMAL HIGH (ref <150)
VLDL: 30 mg/dL (ref 0–40)

## 2018-05-07 LAB — HEMOGLOBIN: Hemoglobin: 10.9 g/dL — ABNORMAL LOW (ref 13.0–17.0)

## 2018-05-07 LAB — MAGNESIUM: Magnesium: 1.9 mg/dL (ref 1.7–2.4)

## 2018-05-07 MED ORDER — SACUBITRIL-VALSARTAN 24-26 MG PO TABS
1.0000 | ORAL_TABLET | Freq: Two times a day (BID) | ORAL | Status: DC
  Administered 2018-05-08 – 2018-05-09 (×3): 1 via ORAL
  Filled 2018-05-07 (×4): qty 1

## 2018-05-07 MED ORDER — CARVEDILOL 3.125 MG PO TABS
3.1250 mg | ORAL_TABLET | Freq: Two times a day (BID) | ORAL | Status: DC
  Administered 2018-05-07 – 2018-05-08 (×2): 3.125 mg via ORAL
  Filled 2018-05-07 (×2): qty 1

## 2018-05-07 MED ORDER — INSULIN DETEMIR 100 UNIT/ML ~~LOC~~ SOLN
18.0000 [IU] | Freq: Every day | SUBCUTANEOUS | Status: DC
  Administered 2018-05-07 – 2018-05-08 (×2): 18 [IU] via SUBCUTANEOUS
  Filled 2018-05-07 (×2): qty 0.18

## 2018-05-07 MED ORDER — CLOPIDOGREL BISULFATE 75 MG PO TABS
75.0000 mg | ORAL_TABLET | Freq: Every day | ORAL | Status: DC
  Administered 2018-05-07 – 2018-05-09 (×3): 75 mg via ORAL
  Filled 2018-05-07 (×3): qty 1

## 2018-05-07 MED ORDER — FUROSEMIDE 10 MG/ML IJ SOLN
20.0000 mg | Freq: Two times a day (BID) | INTRAMUSCULAR | Status: DC
  Administered 2018-05-07 – 2018-05-09 (×4): 20 mg via INTRAVENOUS
  Filled 2018-05-07 (×2): qty 2

## 2018-05-07 MED ORDER — SODIUM CHLORIDE 0.9 % IV SOLN: INTRAVENOUS | Status: DC

## 2018-05-07 MED ORDER — INSULIN ASPART 100 UNIT/ML ~~LOC~~ SOLN
5.0000 [IU] | Freq: Three times a day (TID) | SUBCUTANEOUS | Status: DC
  Administered 2018-05-07 – 2018-05-08 (×2): 5 [IU] via SUBCUTANEOUS
  Filled 2018-05-07 (×2): qty 1

## 2018-05-07 NOTE — Progress Notes (Signed)
SUBJECTIVE: Patient is doing much better   Vitals:   05/06/18 1613 05/06/18 2027 05/07/18 0414 05/07/18 0744  BP: 125/86 111/78 129/87 (!) 135/91  Pulse: 91 84 100 92  Resp: 18 20 20 19   Temp: 98.2 F (36.8 C) 98 F (36.7 C) 99.3 F (37.4 C) 98.4 F (36.9 C)  TempSrc: Oral Oral Oral Oral  SpO2: 93% 95% 90% 95%  Weight:   72.6 kg   Height:        Intake/Output Summary (Last 24 hours) at 05/07/2018 1344 Last data filed at 05/07/2018 1200 Gross per 24 hour  Intake 668.34 ml  Output 2950 ml  Net -2281.66 ml    LABS: Basic Metabolic Panel: Recent Labs    05/06/18 0724 05/07/18 0304  NA 134* 133*  K 3.4* 4.3  CL 99 96*  CO2 26 26  GLUCOSE 283* 195*  BUN 15 20  CREATININE 1.30* 1.46*  CALCIUM 8.7* 8.6*  MG  --  1.9   Liver Function Tests: Recent Labs    05/05/18 2252  AST 15  ALT 12  ALKPHOS 109  BILITOT 0.7  PROT 6.9  ALBUMIN 3.0*   No results for input(s): LIPASE, AMYLASE in the last 72 hours. CBC: Recent Labs    05/05/18 2252 05/06/18 0724 05/07/18 0304  WBC 8.5 10.2  --   NEUTROABS 5.4  --   --   HGB 10.1* 10.3* 10.9*  HCT 31.1* 32.2*  --   MCV 86.1 88.2  --   PLT 357 404*  --    Cardiac Enzymes: Recent Labs    05/05/18 2252  TROPONINI 1.02*   BNP: Invalid input(s): POCBNP D-Dimer: No results for input(s): DDIMER in the last 72 hours. Hemoglobin A1C: Recent Labs    05/06/18 0724  HGBA1C 11.3*   Fasting Lipid Panel: Recent Labs    05/07/18 0304  CHOL 193  HDL 41  LDLCALC 122*  TRIG 151*  CHOLHDL 4.7   Thyroid Function Tests: Recent Labs    05/06/18 0724  TSH 0.815   Anemia Panel: No results for input(s): VITAMINB12, FOLATE, FERRITIN, TIBC, IRON, RETICCTPCT in the last 72 hours.   PHYSICAL EXAM General: Well developed, well nourished, in no acute distress HEENT:  Normocephalic and atramatic Neck:  No JVD.  Lungs: Clear bilaterally to auscultation and percussion. Heart: HRRR . Normal S1 and S2 without gallops or  murmurs.  Abdomen: Bowel sounds are positive, abdomen soft and non-tender  Msk:  Back normal, normal gait. Normal strength and tone for age. Extremities: No clubbing, cyanosis or edema.   Neuro: Alert and oriented X 3. Psych:  Good affect, responds appropriately  TELEMETRY: Sinus rhythm  ASSESSMENT AND PLAN: Congestive heart failure with severe LV dysfunction due to ischemic cardiomyopathy he has, occluded mid LAD with high-grade lesion in OM1 and OM 2 and no significant disease in RCA with left ventricular ejection fraction 22% on echo as well as LV gram.  Will start the patient on Entresto as well as carvedilol and discharge tomorrow with follow-up nuclear stress test in the office to evaluate for ischemia in the LAD territory before considering for CABG.  Active Problems:   Acute systolic heart failure (HCC)   Malnutrition of moderate degree    Jesse Christian A, MD, [facility] 05/07/2018 1:44 PM

## 2018-05-07 NOTE — Progress Notes (Addendum)
Inpatient Diabetes Program Recommendations  AACE/ADA: New Consensus Statement on Inpatient Glycemic Control (2015)  Target Ranges:  Prepandial:   less than 140 mg/dL      Peak postprandial:   less than 180 mg/dL (1-2 hours)      Critically ill patients:  140 - 180 mg/dL   Results for Jesse Christian, Jesse Christian (MRN [id number]) as of 05/07/2018 08:49  Ref. Range 05/06/2018 05:01 05/06/2018 07:36 05/06/2018 09:01 05/06/2018 12:26 05/06/2018 14:14 05/06/2018 16:14 05/06/2018 21:03  Glucose-Capillary Latest Ref Range: 70 - 99 mg/dL 332 (H)  11 units NOVOLOG  248 (H) 203 (H) 257 (H) 408 (H)  15 units NOVOLOG  341 (H)  14 units NOVOLOG  77   Results for Jesse Christian, Jesse Christian (MRN [id number]) as of 05/07/2018 08:49  Ref. Range 05/07/2018 07:45  Glucose-Capillary Latest Ref Range: 70 - 99 mg/dL 228 (H)  8 units NOVOLOG    Results for Jesse Christian, Jesse Christian (MRN [id number]) as of 05/07/2018 08:49  Ref. Range 09/24/2017 03:51 05/06/2018 07:24  Hemoglobin A1C Latest Ref Range: 4.8 - 5.6 % 11.9 (H) 11.3 (H)    Admit SOB/ CHF/ NSTEMI  History: DM  Home DM Meds: Levemir 30 units BID        Regular 5-15 units TID  Current Orders: Levemir 18 units QHS       Novolog Moderate Correction Scale/ SSI (0-15 units) TID AC + HS      Novolog 3 units TID with meals     MD- Please note that RN HELD Levemir last PM due to CBG being 77 mg/dl at bedtime.  NO order to HOLD this insulin.  Concern that pt could be at risk for development of DKA without basal insulin on board.  Please change timing of Levemir to Daily in the AM and please make sure pt gets a dose of Levemir this AM.    Underwent Cardiac Cath yesterday AM.   Diabetes Coordinator met with pt back in June of 2019 and spoke with him extensively about his A1c of 11.9%. Current A1c of 11.3% shows no change in CBG control at home.    --Will follow patient during hospitalization--  Wyn Quaker RN, MSN, CDE Diabetes Coordinator Inpatient Glycemic  Control Team Team Pager: [phone] (8a-5p)

## 2018-05-07 NOTE — Progress Notes (Signed)
[employer] [facility] [place] [facility]   PATIENT NAME: Jesse Christian    MR#:  [id number]  DATE OF BIRTH:  02-24-1971  SUBJECTIVE:  CHIEF COMPLAINT:   Chief Complaint  Patient presents with  . Shortness of Breath   Shortness of breath on exertion. REVIEW OF SYSTEMS:  Review of Systems  Constitutional: Negative for chills, fever and malaise/fatigue.  HENT: Negative for sore throat.   Eyes: Negative for blurred vision and double vision.  Respiratory: Positive for shortness of breath. Negative for cough, hemoptysis, wheezing and stridor.   Cardiovascular: Negative for chest pain, palpitations, orthopnea and leg swelling.  Gastrointestinal: Negative for abdominal pain, blood in stool, diarrhea, melena, nausea and vomiting.  Genitourinary: Negative for dysuria, flank pain and hematuria.  Musculoskeletal: Negative for back pain and joint pain.  Skin: Negative for rash.  Neurological: Negative for dizziness, sensory change, focal weakness, seizures, loss of consciousness, weakness and headaches.  Endo/Heme/Allergies: Negative for polydipsia.  Psychiatric/Behavioral: Negative for depression. The patient is not nervous/anxious.     DRUG ALLERGIES:   Allergies  Allergen Reactions  . Lyrica [Pregabalin] Other (See Comments)    Peripheral edema   VITALS:  Blood pressure (!) 135/91, pulse 92, temperature 98.4 F (36.9 C), temperature source Oral, resp. rate 19, height 6' (1.829 m), weight 72.6 kg, SpO2 95 %. PHYSICAL EXAMINATION:  Physical Exam Constitutional:      General: He is not in acute distress.    Appearance: Normal appearance.  HENT:     Head: Normocephalic.     Mouth/Throat:     Mouth: Mucous membranes are moist.  Eyes:     General: No scleral icterus.    Conjunctiva/sclera: Conjunctivae normal.     Pupils: Pupils are equal, round, and reactive to light.  Neck:     Musculoskeletal: Normal range of motion and neck supple.     Vascular: No JVD.   Trachea: No tracheal deviation.  Cardiovascular:     Rate and Rhythm: Normal rate and regular rhythm.     Heart sounds: Normal heart sounds. No murmur. No gallop.   Pulmonary:     Effort: Pulmonary effort is normal. No respiratory distress.     Breath sounds: Normal breath sounds. No stridor. No wheezing, rhonchi or rales.  Chest:     Chest wall: No tenderness.  Abdominal:     General: Bowel sounds are normal. There is no distension.     Palpations: Abdomen is soft.     Tenderness: There is no abdominal tenderness. There is no rebound.  Musculoskeletal: Normal range of motion.        General: No tenderness.     Right lower leg: He exhibits no tenderness. No edema.     Left lower leg: He exhibits no tenderness. No edema.  Skin:    Findings: No erythema or rash.  Neurological:     General: No focal deficit present.     Mental Status: He is alert and oriented to person, place, and time.     Cranial Nerves: No cranial nerve deficit.  Psychiatric:        Mood and Affect: Mood normal.    LABORATORY PANEL:  Male CBC Recent Labs  Lab 05/06/18 0724 05/07/18 0304  WBC 10.2  --   HGB 10.3* 10.9*  HCT 32.2*  --   PLT 404*  --    ------------------------------------------------------------------------------------------------------------------ Chemistries  Recent Labs  Lab 05/05/18 2252  05/07/18 0304  NA 130*   < >  133*  K 4.2   < > 4.3  CL 99   < > 96*  CO2 23   < > 26  GLUCOSE 386*   < > 195*  BUN 15   < > 20  CREATININE 1.14   < > 1.46*  CALCIUM 8.6*   < > 8.6*  MG  --   --  1.9  AST 15  --   --   ALT 12  --   --   ALKPHOS 109  --   --   BILITOT 0.7  --   --    < > = values in this interval not displayed.   RADIOLOGY:  No results found. ASSESSMENT AND PLAN:  This is a 48 year old male admitted for new onset heart failure. 1.   Acute systolic CHF: Continue IV Lasix and follow-up echo: EF20%. Start Entresto and [employer] Global[employer]g. 2. NSTEMI due to three-vessel CAD.     Continue aspirin and Lipitor, start Plavix and follow-up nuclear stress test in the office to evaluate for ischemia in the LAD territory before considering for CABG per Dr. Welton FlakesKhan.  3. Diabetes mellitus type 2: Continue basal insulin in addition to sliding scale while hospitalized 4. Hypertension: Controlled; change to Entresto and start low-dose Coreg per Dr. Welton FlakesKhan. 5.Hyperlipidemia: LDL 122.  Continue Lipitor. 6. Opiate addiction: Continue methadone as directed by treatment facility. Tobacco abuse.  Smoking cessation was counseled for 3 to 4 minutes.  The patient wants to quit smoking.  ARF.  Possible due to ATN secondary to cardiac cath and diuretics.  Decrease Lasix dose.  Follow-up BMP.  Hyponatremia.  Follow-up BMP.  On Lasix.  Anemia of chronic disease.  Stable.  I discussed with Dr. Welton FlakesKhan. All the records are reviewed and case discussed with Care Management/Social Worker. Management plans discussed with the patient, family and they are in agreement.  CODE STATUS: Full Code  TOTAL TIME TAKING CARE OF THIS PATIENT: 33 minutes.   More than 50% of the time was spent in counseling/coordination of care: YES  POSSIBLE D/C IN 1-2 DAYS, DEPENDING ON CLINICAL CONDITION.   Shaune PollackQing Doshia Dalia M.D on 05/07/2018 at 2:28 PM  Between 7am to 6pm - Pager - [phone]  After 6pm go to [email or web address] - Therapist, nutritionalpassword [email or web address]  [employer]  Hospitalists

## 2018-05-08 LAB — BASIC METABOLIC PANEL
Anion gap: 9 (ref 5–15)
BUN: 31 mg/dL — ABNORMAL HIGH (ref 6–20)
CO2: 28 mmol/L (ref 22–32)
Calcium: 8.6 mg/dL — ABNORMAL LOW (ref 8.9–10.3)
Chloride: 96 mmol/L — ABNORMAL LOW (ref 98–111)
Creatinine, Ser: 1.38 mg/dL — ABNORMAL HIGH (ref 0.61–1.24)
GFR calc Af Amer: >60 mL/min (ref >60)
GFR calc non Af Amer: >60 mL/min (ref >60)
Glucose, Bld: 223 mg/dL — ABNORMAL HIGH (ref 70–99)
Potassium: 4.1 mmol/L (ref 3.5–5.1)
Sodium: 133 mmol/L — ABNORMAL LOW (ref 135–145)

## 2018-05-08 LAB — GLUCOSE, CAPILLARY
Glucose-Capillary: 297 mg/dL — ABNORMAL HIGH (ref 70–99)
Glucose-Capillary: 453 mg/dL — ABNORMAL HIGH (ref 70–99)
Glucose-Capillary: 142 mg/dL — ABNORMAL HIGH (ref 70–99)
Glucose-Capillary: 181 mg/dL — ABNORMAL HIGH (ref 70–99)

## 2018-05-08 LAB — MAGNESIUM: Magnesium: 1.9 mg/dL (ref 1.7–2.4)

## 2018-05-08 MED ORDER — INSULIN ASPART 100 UNIT/ML ~~LOC~~ SOLN
0.0000 [IU] | Freq: Every day | SUBCUTANEOUS | Status: DC
  Administered 2018-05-09: 2 [IU] via SUBCUTANEOUS
  Administered 2018-05-10: 3 [IU] via SUBCUTANEOUS
  Filled 2018-05-08 (×2): qty 1

## 2018-05-08 MED ORDER — CARVEDILOL 6.25 MG PO TABS: 6.2500 mg | ORAL_TABLET | Freq: Two times a day (BID) | ORAL | 1 refills | Status: DC

## 2018-05-08 MED ORDER — FUROSEMIDE 20 MG PO TABS: 20.0000 mg | ORAL_TABLET | Freq: Two times a day (BID) | ORAL | 1 refills | Status: DC

## 2018-05-08 MED ORDER — ATORVASTATIN CALCIUM 40 MG PO TABS: 40.0000 mg | ORAL_TABLET | Freq: Every day | ORAL | 2 refills | Status: DC

## 2018-05-08 MED ORDER — INSULIN ASPART 100 UNIT/ML ~~LOC~~ SOLN
8.0000 [IU] | Freq: Three times a day (TID) | SUBCUTANEOUS | Status: DC
  Administered 2018-05-08 – 2018-05-09 (×3): 8 [IU] via SUBCUTANEOUS
  Filled 2018-05-08 (×2): qty 1

## 2018-05-08 MED ORDER — IPRATROPIUM-ALBUTEROL 0.5-2.5 (3) MG/3ML IN SOLN: 3.0000 mL | RESPIRATORY_TRACT | Status: DC | PRN

## 2018-05-08 MED ORDER — NITROGLYCERIN 0.4 MG SL SUBL: 0.4000 mg | SUBLINGUAL_TABLET | SUBLINGUAL | 1 refills | Status: DC | PRN

## 2018-05-08 MED ORDER — CARVEDILOL 6.25 MG PO TABS
6.2500 mg | ORAL_TABLET | Freq: Two times a day (BID) | ORAL | Status: DC
  Administered 2018-05-09: 6.25 mg via ORAL
  Filled 2018-05-08 (×2): qty 1

## 2018-05-08 MED ORDER — FUROSEMIDE 10 MG/ML IJ SOLN
INTRAMUSCULAR | Status: AC
  Filled 2018-05-08: qty 4

## 2018-05-08 MED ORDER — INSULIN ASPART 100 UNIT/ML ~~LOC~~ SOLN
0.0000 [IU] | Freq: Three times a day (TID) | SUBCUTANEOUS | Status: DC
  Administered 2018-05-08: 2 [IU] via SUBCUTANEOUS
  Administered 2018-05-08 – 2018-05-09 (×3): 15 [IU] via SUBCUTANEOUS
  Administered 2018-05-10: 5 [IU] via SUBCUTANEOUS
  Administered 2018-05-10: 8 [IU] via SUBCUTANEOUS
  Administered 2018-05-10: 11 [IU] via SUBCUTANEOUS
  Administered 2018-05-11 (×2): 3 [IU] via SUBCUTANEOUS
  Administered 2018-05-12: 2 [IU] via SUBCUTANEOUS
  Filled 2018-05-08 (×10): qty 1

## 2018-05-08 MED ORDER — INSULIN DETEMIR 100 UNIT/ML ~~LOC~~ SOLN
22.0000 [IU] | Freq: Every day | SUBCUTANEOUS | Status: DC
  Filled 2018-05-08: qty 0.22

## 2018-05-08 MED ORDER — SACUBITRIL-VALSARTAN 24-26 MG PO TABS: 1.0000 | ORAL_TABLET | Freq: Two times a day (BID) | ORAL | 1 refills | Status: DC

## 2018-05-08 MED ORDER — CLOPIDOGREL BISULFATE 75 MG PO TABS: 75.0000 mg | ORAL_TABLET | Freq: Every day | ORAL | 1 refills | Status: DC

## 2018-05-08 NOTE — Progress Notes (Addendum)
Cardiovascular and Pulmonary Nurse Navigator Note:    48 year old male admitted for new onset of heart failure.  PMHx significant for DM, drug abuse, and HLD.  BNP on admission 2486.   Troponin 1.02.    Patient underwent left heart cath on 05/06/2018.   LEFT HEART CATH AND CORONARY ANGIOGRAPHY  Conclusion     Mid RCA lesion is 60% stenosed.  Prox LAD lesion is 100% stenosed.  Ost 1st Mrg to 1st Mrg lesion is 85% stenosed.  Ost 2nd Mrg to 2nd Mrg lesion is 80% stenosed.  Mid Cx lesion is 80% stenosed.   2 vessel disease with occluded mid LAD, and high grade lesions of OM1 and OM 2 with severe LV dysfunction, once stable consider CABG.   Echo performed on 05/06/2018:   EF:  20%. Impressions: Severely dilated LV and severe LV systolic dysfunction due to ischaemic cardiomyopathy.  Education:   "Heart Attack Bouncing Back" booklet given and reviewed with patient. Discussed the definition of CAD. Discussed elevated troponin and BNP. Discussed EF measurement, his value and normal value.  Patient reported that the Dr Milta Deiters PA was checking on patient transferring to [facility] tomorrow for possible CABG.    ? Discussed modifiable risk factors including controlling blood pressure, cholesterol, and blood sugar; following heart healthy diet; maintaining healthy weight; exercise; and smoking cessation.  ? Discussed cardiac medications including rationale for taking, mechanisms of action, and side effects. Stressed the importance of taking medications as prescribed.  ? Discussed emergency plan for heart attack symptoms. Patient verbalized understanding of need to call 911 and not to drive himself to ER if having cardiac symptoms / chest pain.  ? Diet of low sodium, low fat, low cholesterol heart healthy carb modified diet discussed. Dietitian Consultation for diet education has been completed.  Patient is also being followed by the inpatient Diabetes Coordinator.   ? Smoking Cessation - On  patient's H & P patient is listed as a current smoker of 0.5 packs per day.  Patient is reporting to this RN that he has quit smoking.   ? Exercise - Benefits of exercised discussed. Patient reports that he is very active.  He Alben Spittle, works at [employer], and is always staying busy.   Informed patient that given his NSTEMI and CHF with EF of 20% he is a candidate for Cardiac Rehab. Explained to patient he must be evaluated for further treatment for his severe 2-vessel disease before considering outpatient Cardiac Rehab.    Zoll Life Vest - Briefly explained the purpose of the external wearable defibrillator to patient and family.  Dr. Welton Flakes or his PA plan to order this for patient if he is discharged to home.    Per my previous note:  Patient and family are requesting that patient be transferred to [facility] or another facility for further treatment of his severe 2-vessel coronary artery disease.  Caroleen Hamman, PA informed.   ?  Patient / family appreciative of the information.   Will continue to follow, offer support, answer questions while patient hospitalized.    NOTE:  Heart Failure Education has been completed by patient's assigned.RN.  ? Army Melia, RN, BSN, [facility]  [place]  [facility] Cardiac & Pulmonary Rehab  Cardiovascular & Pulmonary Nurse Navigator  Direct Line: [phone]  Department Phone #: [phone] Fax: [phone]  Email Address: [email or web address]

## 2018-05-08 NOTE — Progress Notes (Addendum)
CRITICAL VALUE ALERT  Critical Value:  CBG 453  Date & Time Notied:  05/08/2018 1250  Provider Notified: Dr. Imogene Burn  Orders Received/Actions taken: see orders, Levemier was held last night. Will continue to monitor.

## 2018-05-08 NOTE — Progress Notes (Addendum)
[employer] - [place] at [facility]   PATIENT NAME: Jesse Christian    MR#:  [id number]  DATE OF BIRTH:  1971/01/21  SUBJECTIVE:  CHIEF COMPLAINT:   Chief Complaint  Patient presents with  . Shortness of Breath   Shortness of breath on exertion and generalized weakness.  On oxygen 2 L by nasal cannula. REVIEW OF SYSTEMS:  Review of Systems  Constitutional: Positive for malaise/fatigue. Negative for chills and fever.  HENT: Negative for sore throat.   Eyes: Negative for blurred vision and double vision.  Respiratory: Positive for shortness of breath. Negative for cough, hemoptysis, wheezing and stridor.   Cardiovascular: Negative for chest pain, palpitations, orthopnea and leg swelling.  Gastrointestinal: Negative for abdominal pain, blood in stool, diarrhea, melena, nausea and vomiting.  Genitourinary: Negative for dysuria, flank pain and hematuria.  Musculoskeletal: Negative for back pain and joint pain.  Skin: Negative for rash.  Neurological: Negative for dizziness, sensory change, focal weakness, seizures, loss of consciousness, weakness and headaches.  Endo/Heme/Allergies: Negative for polydipsia.  Psychiatric/Behavioral: Negative for depression. The patient is not nervous/anxious.     DRUG ALLERGIES:   Allergies  Allergen Reactions  . Lyrica [Pregabalin] Other (See Comments)    Peripheral edema   VITALS:  Blood pressure (!) 145/97, pulse 92, temperature 98.4 F (36.9 C), temperature source Oral, resp. rate 20, height 6' (1.829 m), weight 71.6 kg, SpO2 95 %. PHYSICAL EXAMINATION:  Physical Exam Constitutional:      General: He is not in acute distress.    Appearance: Normal appearance.  HENT:     Head: Normocephalic.     Mouth/Throat:     Mouth: Mucous membranes are moist.  Eyes:     General: No scleral icterus.    Conjunctiva/sclera: Conjunctivae normal.     Pupils: Pupils are equal, round, and reactive to light.  Neck:   Musculoskeletal: Normal range of motion and neck supple.     Vascular: No JVD.     Trachea: No tracheal deviation.  Cardiovascular:     Rate and Rhythm: Normal rate and regular rhythm.     Heart sounds: Normal heart sounds. No murmur. No gallop.   Pulmonary:     Effort: Pulmonary effort is normal. No respiratory distress.     Breath sounds: Normal breath sounds. No stridor. No wheezing, rhonchi or rales.  Chest:     Chest wall: No tenderness.  Abdominal:     General: Bowel sounds are normal. There is no distension.     Palpations: Abdomen is soft.     Tenderness: There is no abdominal tenderness. There is no rebound.  Musculoskeletal: Normal range of motion.        General: No tenderness.     Right lower leg: He exhibits no tenderness. No edema.     Left lower leg: He exhibits no tenderness. No edema.  Skin:    Findings: No erythema or rash.  Neurological:     General: No focal deficit present.     Mental Status: He is alert and oriented to person, place, and time.     Cranial Nerves: No cranial nerve deficit.  Psychiatric:        Mood and Affect: Mood normal.    LABORATORY PANEL:  Male CBC Recent Labs  Lab 05/06/18 0724 05/07/18 0304  WBC 10.2  --   HGB 10.3* 10.9*  HCT 32.2*  --   PLT 404*  --    ------------------------------------------------------------------------------------------------------------------ Chemistries  Recent Labs  Lab 05/05/18 2252  05/08/18 0302  NA 130*   < > 133*  K 4.2   < > 4.1  CL 99   < > 96*  CO2 23   < > 28  GLUCOSE 386*   < > 223*  BUN 15   < > 31*  CREATININE 1.14   < > 1.38*  CALCIUM 8.6*   < > 8.6*  MG  --    < > 1.9  AST 15  --   --   ALT 12  --   --   ALKPHOS 109  --   --   BILITOT 0.7  --   --    < > = values in this interval not displayed.   RADIOLOGY:  No results found. ASSESSMENT AND PLAN:  This is a 48 year old male admitted for new onset heart failure. 1.   Acute systolic CHF: Continue IV Lasix and  follow-up echo: EF20%. Started Entresto and [employer].  2. NSTEMI due to three-vessel CAD.    Continue aspirin and Lipitor, start Plavix and follow-up nuclear stress test in the office to evaluate for ischemia in the LAD territory before considering for CABG per Dr. Welton Flakes. He is not a good candidate for cardiac rehab. LifeVest tomorrow per Dr. Welton Flakes.    Acute respiratory failure with hypoxia due to above.  Try to wean off oxygen.  Continue Lasix.   3. Hyperglycemia and diabetes mellitus type 2:  Increase Levemir to 22 units, NovoLog 8 units AC and continue sliding scale.  4. Hypertension: Controlled; changed to Entresto and start low-dose Coreg per Dr. Welton Flakes.  5.Hyperlipidemia: LDL 122.  Continue Lipitor. 6. Opiate addiction: Continue methadone as directed by treatment facility. Tobacco abuse.  Smoking cessation was counseled for 3 to 4 minutes.  The patient wants to quit smoking.  ARF.  Possible due to ATN secondary to cardiac cath and diuretics.  Decreased Lasix dose.  Follow-up BMP.  Hyponatremia.  Follow-up BMP.  On Lasix.  Anemia of chronic disease.  Stable.  I discussed with Dr. Welton Flakes. All the records are reviewed and case discussed with Care Management/Social Worker. Management plans discussed with the patient, his mother and they are in agreement.  CODE STATUS: Full Code  TOTAL TIME TAKING CARE OF THIS PATIENT: 33 minutes.   More than 50% of the time was spent in counseling/coordination of care: YES  POSSIBLE D/C IN 1-2 DAYS, DEPENDING ON CLINICAL CONDITION.   Shaune Pollack M.D on 05/08/2018 at 1:17 PM  Between 7am to 6pm - Pager - [phone]  After 6pm go to [email or web address] - Therapist, nutritional Hospitalists

## 2018-05-08 NOTE — Plan of Care (Signed)
Patient complains of dyspnea with exertion. No complaints of pain. Patient is eager about education. Counseled about smoking cessation.

## 2018-05-08 NOTE — Progress Notes (Signed)
Ch was informed during progress rounds that pt was concerned about going home in his current condition to complete a treatment. Ch visited pt to see if the pt has received any updates on his care plan related to being transferred to another medical facility to receive a trt. Pt expressed that he was worried about not receiving the procedure tomorrow. Ch provided compassionate presence as pt shared that He works at [employer] and has disability insurance that needs to be filed but he can't do it until he knows the next steps towards him receiving a treatment. Pt shared that he wants the trt ASAP b/c he wants to be well for the summer to participate in outdoor activities such as [place]. Ch shared that they would contact someone to find out the status on him being transferred.    05/08/18 1400  Clinical Encounter Type  Visited With Patient and family together  Visit Type Initial;Psychological support;Spiritual support;Social support  Referral From Care management  Consult/Referral To Chaplain  Spiritual Encounters  Spiritual Needs Emotional;Grief support  Stress Factors  Patient Stress Factors Exhausted;Financial concerns;Health changes;Lack of knowledge;Major life changes  Family Stress Factors Exhausted;Lack of knowledge

## 2018-05-08 NOTE — Discharge Summary (Signed)
[employer] -  at [facility]   PATIENT NAME: Jesse Christian    MR#:  [id number]  DATE OF BIRTH:  Jan 16, 1971  DATE OF ADMISSION:  05/05/2018   ADMITTING PHYSICIAN: Arnaldo NatalMichael S Diamond, MD  DATE OF DISCHARGE: 05/12/2018  PRIMARY CARE PHYSICIAN: Dorcas CarrowJohnson, [name], DO   ADMISSION DIAGNOSIS:  NSTEMI (non-ST elevated myocardial infarction) (HCC) [I21.4] Acute heart failure, unspecified heart failure type (HCC) [I50.9] DISCHARGE DIAGNOSIS:  Active Problems:   Acute systolic heart failure (HCC)   Malnutrition of moderate degree  SECONDARY DIAGNOSIS:   Past Medical History:  Diagnosis Date  . Diabetes mellitus without complication (HCC)   . Drug abuse (HCC)   . Hyperlipidemia   . Right arm fracture 2018   HOSPITAL COURSE:  This is a 48 year old male admitted for new onset heart failure. 1.Acute systolic CHF:He was treated with IV Lasix and echo: EF20%. Started Entresto and [employer].  But on hold due to hypotension and renal failure.  2. NSTEMIdue to three-vessel CAD.  Continue aspirin and Lipitor, started Plavix and follow-up nuclear stress test in the office to evaluate for ischemia in the LAD territory before considering for CABG per Dr. Welton FlakesKhan. He is not a good candidate for cardiac rehab. LifeVestorderedfor ischemic cardiomyopathy. [facility] per Dr. Welton FlakesKhan.   Hold Plavix for possible CABG.  Acute respiratory failure with hypoxia due to above.   Weaned off oxygen.  Hold Lasix due to worsening renal function.  3. Hyperglycemia and diabetes mellitus type 2:  Levemir to 20 units bid, NovoLog 8 units AC and continue sliding scale. Hypoglycemia improved.   4. Hypertension: Controlled; changed to Entresto and started low-dose Coreg per Dr. Welton FlakesKhan. Hold due to hypotension.  5.Hyperlipidemia: LDL 122.  Continue Lipitor. 6. Opiate addiction: Continue methadone as directed by treatment facility. Tobacco abuse. Smoking  cessation was counseled for 3 to 4 minutes. The patient wants to quit smoking.  ARF.  possible due to ATN secondary to cardiac cath and diuretics.   Hold Lasix and Entresto.   Improving, follow-up BMP.  Hyponatremia.  Improved.  Anemia of chronic disease.  Stable.   Nausea.  The patient has nausea almost once a month for the past 1 year.  Possible related to diabetes.  He needs to follow-up PCP.  Changed to Reglan as needed. DISCHARGE CONDITIONS:  Guarded, [facility]. CONSULTS OBTAINED:  Treatment Team:  Laurier NancyKhan, Shaukat A, MD DRUG ALLERGIES:   Allergies  Allergen Reactions  . Lyrica [Pregabalin] Other (See Comments)    Peripheral edema   DISCHARGE MEDICATIONS:   Allergies as of 05/09/2018      Reactions   Lyrica [pregabalin] Other (See Comments)   Peripheral edema      Medication List    STOP taking these medications   lisinopril 20 MG tablet Commonly known as:  PRINIVIL,ZESTRIL     TAKE these medications   aspirin 81 MG chewable tablet Chew 81 mg by mouth daily.   atorvastatin 40 MG tablet Commonly known as:  LIPITOR Take 1 tablet (40 mg total) by mouth daily at 6 PM.   carvedilol 6.25 MG tablet Commonly known as:  COREG Take 1 tablet (6.25 mg total) by mouth 2 (two) times daily with a meal.   clopidogrel 75 MG tablet Commonly known as:  PLAVIX Take 1 tablet (75 mg total) by mouth daily.   furosemide 20 MG tablet Commonly known as:  LASIX Take 1 tablet (20 mg  total) by mouth 2 (two) times daily.   insulin detemir 100 UNIT/ML injection Commonly known as:  LEVEMIR Inject 0.4 mLs (40 Units total) into the skin 2 (two) times daily. What changed:  how much to take   insulin regular 100 units/mL injection Commonly known as:  NOVOLIN R,HUMULIN R Inject 5-15 Units into the skin 3 (three) times daily before meals. Per sliding scale   methadone 10 MG tablet Commonly known as:  DOLOPHINE Take 130 mg by mouth daily.     nitroGLYCERIN 0.4 MG SL tablet Commonly known as:  NITROSTAT Place 1 tablet (0.4 mg total) under the tongue every 5 (five) minutes as needed for chest pain.   sacubitril-valsartan 24-26 MG Commonly known as:  ENTRESTO Take 1 tablet by mouth 2 (two) times daily.        DISCHARGE INSTRUCTIONS:  See AVS. If you experience worsening of your admission symptoms, develop shortness of breath, life threatening emergency, suicidal or homicidal thoughts you must seek medical attention immediately by calling 911 or calling your MD immediately  if symptoms less severe.  You Must read complete instructions/literature along with all the possible adverse reactions/side effects for all the Medicines you take and that have been prescribed to you. Take any new Medicines after you have completely understood and accpet all the possible adverse reactions/side effects.   Please note  You were cared for by a hospitalist during your hospital stay. If you have any questions about your discharge medications or the care you received while you were in the hospital after you are discharged, you can call the unit and asked to speak with the hospitalist on call if the hospitalist that took care of you is not available. Once you are discharged, your primary care physician will handle any further medical issues. Please note that NO REFILLS for any discharge medications will be authorized once you are discharged, as it is imperative that you return to your primary care physician (or establish a relationship with a primary care physician if you do not have one) for your aftercare needs so that they can reassess your need for medications and monitor your lab values.    On the day of Discharge:  VITAL SIGNS:  Blood pressure 109/81, pulse 81, temperature 98.3 F (36.8 C), temperature source Oral, resp. rate 20, height 6' (1.829 m), weight 71.6 kg, SpO2 93 %. PHYSICAL EXAMINATION:  GENERAL:  48 y.o.-year-old patient lying in  the bed with no acute distress.  EYES: Pupils equal, round, reactive to light and accommodation. No scleral icterus. Extraocular muscles intact.  HEENT: Head atraumatic, normocephalic. Oropharynx and nasopharynx clear.  NECK:  Supple, no jugular venous distention. No thyroid enlargement, no tenderness.  LUNGS: Normal breath sounds bilaterally, no wheezing, rales,rhonchi or crepitation. No use of accessory muscles of respiration.  CARDIOVASCULAR: S1, S2 normal. No murmurs, rubs, or gallops.  ABDOMEN: Soft, non-tender, non-distended. Bowel sounds present. No organomegaly or mass.  EXTREMITIES: No pedal edema, cyanosis, or clubbing.  NEUROLOGIC: Cranial nerves II through XII are intact. Muscle strength 5/5 in all extremities. Sensation intact. Gait not checked.  PSYCHIATRIC: The patient is alert and oriented x 3.  SKIN: No obvious rash, lesion, or ulcer.  DATA REVIEW:   CBC Recent Labs  Lab 05/06/18 0724 05/07/18 0304  WBC 10.2  --   HGB 10.3* 10.9*  HCT 32.2*  --   PLT 404*  --     Chemistries  Recent Labs  Lab 05/05/18 2252  05/08/18 0302  NA 130*   < > 133*  K 4.2   < > 4.1  CL 99   < > 96*  CO2 23   < > 28  GLUCOSE 386*   < > 223*  BUN 15   < > 31*  CREATININE 1.14   < > 1.38*  CALCIUM 8.6*   < > 8.6*  MG  --    < > 1.9  AST 15  --   --   ALT 12  --   --   ALKPHOS 109  --   --   BILITOT 0.7  --   --    < > = values in this interval not displayed.     Microbiology Results  Results for orders placed or performed during the hospital encounter of 09/23/17  Aerobic/Anaerobic Culture (surgical/deep wound)     Status: None   Collection Time: 09/23/17 11:24 AM  Result Value Ref Range Status   Specimen Description   Final    FOOT LEFT Performed at [facility], [address], [place], [place] [address]    Special Requests   Final    NONE Performed at [facility], [address] [place], [place] [address]    Gram Stain   Final    RARE WBC  PRESENT, PREDOMINANTLY PMN RARE GRAM POSITIVE COCCI IN PAIRS    Culture   Final    FEW STAPHYLOCOCCUS AUREUS WITHIN MIXED ORGANISMS NO ANAEROBES ISOLATED Performed at [facility], [address], [place], [place] [address]    Report Status 09/28/2017 FINAL  Final   Organism ID, Bacteria STAPHYLOCOCCUS AUREUS  Final      Susceptibility   Staphylococcus aureus - MIC*    CIPROFLOXACIN <=0.5 SENSITIVE Sensitive     ERYTHROMYCIN >=8 RESISTANT Resistant     GENTAMICIN <=0.5 SENSITIVE Sensitive     OXACILLIN 0.5 SENSITIVE Sensitive     TETRACYCLINE <=1 SENSITIVE Sensitive     VANCOMYCIN 1 SENSITIVE Sensitive     TRIMETH/SULFA <=10 SENSITIVE Sensitive     CLINDAMYCIN RESISTANT Resistant     RIFAMPIN <=0.5 SENSITIVE Sensitive     Inducible Clindamycin POSITIVE Resistant     * FEW STAPHYLOCOCCUS AUREUS  Blood culture (routine x 2)     Status: None   Collection Time: 09/23/17  1:28 PM  Result Value Ref Range Status   Specimen Description BLOOD RIGHT ANTECUBITAL  Final   Special Requests   Final    BOTTLES DRAWN AEROBIC AND ANAEROBIC Blood Culture results may not be optimal due to an excessive volume of blood received in culture bottles   Culture   Final    NO GROWTH 5 DAYS Performed at [facility], [address] [place], [place] [address]    Report Status 09/28/2017 FINAL  Final  Blood culture (routine x 2)     Status: None   Collection Time: 09/23/17  1:33 PM  Result Value Ref Range Status   Specimen Description BLOOD RIGHT ANTECUBITAL  Final   Special Requests   Final    BOTTLES DRAWN AEROBIC AND ANAEROBIC Blood Culture adequate volume   Culture   Final    NO GROWTH 5 DAYS Performed at [facility], [address], [place], [place] [address]    Report Status 09/28/2017 FINAL  Final    RADIOLOGY:  No results found.   Management plans discussed with the patient, family and they are in agreement.  CODE STATUS: Full Code  TOTAL TIME TAKING  CARE OF THIS PATIENT: 33 minutes.    Shaune Pollack M.D on 05/12/2018 at 7:56 AM  Between 7am to 6pm - Pager - [phone]  After 6pm go to [email or web address] - Social research officer, government  [facility] [place] [facility]  Office  [phone]  CC: Primary care physician; Dorcas Carrow, DO   Note: This dictation was prepared with Dragon dictation along with smaller phrase technology. Any transcriptional errors that result from this process are unintentional.

## 2018-05-08 NOTE — Progress Notes (Signed)
Sent Dr. Imogene Burn secure chat regarding patient's low EF of 20%, CHF, 2 vessel CAD and possible need for [facility] upon discharge.  Also, requested Dr. Imogene Burn to address Cardiac Rehab for this patient.  Patient not a candidate until he is evaluated for CABG given his 2 vessel disease and EF of 20%.  Pending outpatient evaluation once HF is stable.    Army Melia, RN, BSN, [facility]  [place]  [facility] Cardiac & Pulmonary Rehab  Cardiovascular & Pulmonary Nurse Navigator  Direct Line: [phone]  Department Phone #: [phone] Fax: [phone]  Email Address: [email or web address]

## 2018-05-08 NOTE — Progress Notes (Signed)
SUBJECTIVE: No chest pain, but has shortness of breath with mild exertion. Reports getting up to use the bathroom makes him very fatigued.   Vitals:   05/07/18 2000 05/08/18 0328 05/08/18 0329 05/08/18 0720  BP: 121/85 (!) 153/97  (!) 145/97  Pulse: 83 88  92  Resp: 20 20    Temp: 98 F (36.7 C) 98 F (36.7 C)  98.4 F (36.9 C)  TempSrc: Oral Oral  Oral  SpO2: 95% 98%  95%  Weight:   71.6 kg   Height:        Intake/Output Summary (Last 24 hours) at 05/08/2018 0850 Last data filed at 05/08/2018 0328 Gross per 24 hour  Intake -  Output 3150 ml  Net -3150 ml    LABS: Basic Metabolic Panel: Recent Labs    05/07/18 0304 05/08/18 0302  NA 133* 133*  K 4.3 4.1  CL 96* 96*  CO2 26 28  GLUCOSE 195* 223*  BUN 20 31*  CREATININE 1.46* 1.38*  CALCIUM 8.6* 8.6*  MG 1.9 1.9   Liver Function Tests: Recent Labs    05/05/18 2252  AST 15  ALT 12  ALKPHOS 109  BILITOT 0.7  PROT 6.9  ALBUMIN 3.0*   No results for input(s): LIPASE, AMYLASE in the last 72 hours. CBC: Recent Labs    05/05/18 2252 05/06/18 0724 05/07/18 0304  WBC 8.5 10.2  --   NEUTROABS 5.4  --   --   HGB 10.1* 10.3* 10.9*  HCT 31.1* 32.2*  --   MCV 86.1 88.2  --   PLT 357 404*  --    Cardiac Enzymes: Recent Labs    05/05/18 2252  TROPONINI 1.02*   BNP: Invalid input(s): POCBNP D-Dimer: No results for input(s): DDIMER in the last 72 hours. Hemoglobin A1C: Recent Labs    05/06/18 0724  HGBA1C 11.3*   Fasting Lipid Panel: Recent Labs    05/07/18 0304  CHOL 193  HDL 41  LDLCALC 122*  TRIG 151*  CHOLHDL 4.7   Thyroid Function Tests: Recent Labs    05/06/18 0724  TSH 0.815   Anemia Panel: No results for input(s): VITAMINB12, FOLATE, FERRITIN, TIBC, IRON, RETICCTPCT in the last 72 hours.   PHYSICAL EXAM General: Thin, pale, short of breath with any exertion HEENT:  Normocephalic and atramatic Neck:  No JVD.  Lungs: Clear bilaterally to auscultation and percussion. Heart:  HRRR . Normal S1 and S2 without gallops or murmurs.  Abdomen: Bowel sounds are positive, abdomen soft and non-tender  Msk:  Back normal, normal gait. Normal strength and tone for age. Extremities: No clubbing, cyanosis or edema.   Neuro: Alert and oriented X 3. Psych:  Good affect, responds appropriately  TELEMETRY: NSR 78bpm  ASSESSMENT AND PLAN: Congestive heart failure with severe LV dysfunction due to ischemic cardiomyopathy LVEF 20%.  Cath shows occluded mid LAD with high-grade lesion in OM1 and OM 2 and 60% stenosis in RCA.  Continue Entresto and carvedilol. Will order LifeVest for ischemic cardiomyopathy and refer to [facility] for evaluation for CABG.  May discharge today follow up outpatient.   Active Problems:   Acute systolic heart failure (HCC)   Malnutrition of moderate degree    Caroleen Hamman, NP-C 05/08/2018 8:50 AM Cell: [phone]

## 2018-05-08 NOTE — Progress Notes (Signed)
Inpatient Diabetes Program Recommendations  AACE/ADA: New Consensus Statement on Inpatient Glycemic Control  Target Ranges:  Prepandial:   less than 140 mg/dL      Peak postprandial:   less than 180 mg/dL (1-2 hours)      Critically ill patients:  140 - 180 mg/dL   Results for Jesse Christian, Jesse Christian (MRN [id number]) as of 05/08/2018 10:56  Ref. Range 05/07/2018 07:45 05/07/2018 11:26 05/07/2018 16:31 05/07/2018 20:49 05/08/2018 07:22  Glucose-Capillary Latest Ref Range: 70 - 99 mg/dL 836 (H) 629 (H) 476 (H) 109 (H) 297 (H)   Review of Glycemic Control  Diabetes history: DM2 Outpatient Diabetes medications: Levemir 30 units BID, Regular 5-15 units TID with meals Current orders for Inpatient glycemic control: Levemir 18 units daily, Novolog 5 units TID with meals, Novolog 0-15 units TID with meals  Inpatient Diabetes Program Recommendations:  Insulin - Basal: Please consider increasing Levemir to 22 units daily. Correction (SSI): Please consider ordering Novolog 0-5 units QHS for bedtime correction. Insulin-Meal Coverage: Please consider increasing meal coverage to Novolog 8 units TID with meals.  Thanks, Orlando Penner, RN, MSN, CDE Diabetes Coordinator Inpatient Diabetes Program [phone] (Team Pager from 8am to 5pm)

## 2018-05-08 NOTE — Progress Notes (Signed)
Patient and patient's mother are not wanting patient to be discharged home, but are wanting patient to be transferred to [facility], [facility] or where ever for patient to get further treatment for his heart.   Patient's mother stated, "He is afraid if he goes home without getting treatment for these blockages, that he will die."  Secure Chat Message sent to Dr. Welton Flakes and Caroleen Hamman, PA informing them of the above.     Army Melia, RN, BSN, [facility]  [place]  [facility] Cardiac & Pulmonary Rehab  Cardiovascular & Pulmonary Nurse Navigator  Direct Line: [phone]  Department Phone #: [phone] Fax: [phone]  Email Address: [email or web address]

## 2018-05-08 NOTE — Plan of Care (Addendum)
SATURATION QUALIFICATIONS: (This note is used to comply with regulatory documentation for home oxygen)  Patient Saturations on Room Air at Rest = 95%  Patient Saturations on Room Air while Ambulating = 92%  Patient Saturations on n/a Liters of oxygen while Ambulating = n/a%  Please briefly explain why patient needs home oxygen:  Problem: Activity: Goal: Capacity to carry out activities will improve Outcome: Progressing

## 2018-05-08 NOTE — Progress Notes (Signed)
Advanced Care Plan.  Purpose of Encounter: CODE STATUS. Parties in Attendance: The patient and me. Patient's Decisional Capacity: Yes. Medical Story: Jesse Christian is an 48 y.o. male with history of hypertension, hyperlipidemia, diabetes and drug abuse.  The patient is admitted for non-STEMI and acute respiratory failure with hypoxia due to acute systolic CHF with ejection fraction 20 to 25%.  He has high risk for cardiopulmonary arrest.  I discussed with the patient about his current condition, prognosis and CODE STATUS.  The patient wants to be resuscitated and intubated if he has cardiopulmonary arrest. Plan:  Code Status: Full code. Time spent discussing advance care planning: 17 minutes.

## 2018-05-09 LAB — GASTROINTESTINAL PANEL BY PCR, STOOL (REPLACES STOOL CULTURE)
Adenovirus F40/41: NOT DETECTED
Astrovirus: NOT DETECTED
CAMPYLOBACTER SPECIES: NOT DETECTED
Cryptosporidium: NOT DETECTED
Cyclospora cayetanensis: NOT DETECTED
ENTEROTOXIGENIC E COLI (ETEC): NOT DETECTED
Entamoeba histolytica: NOT DETECTED
Enteroaggregative E coli (EAEC): NOT DETECTED
Enteropathogenic E coli (EPEC): NOT DETECTED
Giardia lamblia: NOT DETECTED
Norovirus GI/GII: NOT DETECTED
Plesimonas shigelloides: NOT DETECTED
Rotavirus A: NOT DETECTED
Salmonella species: NOT DETECTED
Sapovirus (I, II, IV, and V): NOT DETECTED
Shiga like toxin producing E coli (STEC): NOT DETECTED
Shigella/Enteroinvasive E coli (EIEC): NOT DETECTED
Vibrio cholerae: NOT DETECTED
Vibrio species: NOT DETECTED
Yersinia enterocolitica: NOT DETECTED

## 2018-05-09 LAB — BASIC METABOLIC PANEL
Anion gap: 9 (ref 5–15)
BUN: 42 mg/dL — ABNORMAL HIGH (ref 6–20)
CALCIUM: 8.7 mg/dL — AB (ref 8.9–10.3)
CO2: 29 mmol/L (ref 22–32)
Chloride: 90 mmol/L — ABNORMAL LOW (ref 98–111)
Creatinine, Ser: 1.67 mg/dL — ABNORMAL HIGH (ref 0.61–1.24)
GFR, EST AFRICAN AMERICAN: 56 mL/min — AB (ref >60)
GFR, EST NON AFRICAN AMERICAN: 49 mL/min — AB (ref >60)
Glucose, Bld: 536 mg/dL (ref 70–99)
Potassium: 4.5 mmol/L (ref 3.5–5.1)
Sodium: 128 mmol/L — ABNORMAL LOW (ref 135–145)

## 2018-05-09 LAB — GLUCOSE, CAPILLARY
Glucose-Capillary: 475 mg/dL — ABNORMAL HIGH (ref 70–99)
Glucose-Capillary: 412 mg/dL — ABNORMAL HIGH (ref 70–99)
Glucose-Capillary: 71 mg/dL (ref 70–99)
Glucose-Capillary: 54 mg/dL — ABNORMAL LOW (ref 70–99)
Glucose-Capillary: 149 mg/dL — ABNORMAL HIGH (ref 70–99)
Glucose-Capillary: 145 mg/dL — ABNORMAL HIGH (ref 70–99)
Glucose-Capillary: 206 mg/dL — ABNORMAL HIGH (ref 70–99)

## 2018-05-09 LAB — MRSA PCR SCREENING: MRSA by PCR: NEGATIVE

## 2018-05-09 MED ORDER — INSULIN ASPART 100 UNIT/ML ~~LOC~~ SOLN
10.0000 [IU] | Freq: Three times a day (TID) | SUBCUTANEOUS | Status: DC
  Administered 2018-05-09: 10 [IU] via SUBCUTANEOUS
  Filled 2018-05-09: qty 1

## 2018-05-09 MED ORDER — INSULIN ASPART 100 UNIT/ML ~~LOC~~ SOLN
5.0000 [IU] | Freq: Three times a day (TID) | SUBCUTANEOUS | Status: DC
  Administered 2018-05-10 (×2): 5 [IU] via SUBCUTANEOUS
  Filled 2018-05-09 (×2): qty 1

## 2018-05-09 MED ORDER — INSULIN DETEMIR 100 UNIT/ML ~~LOC~~ SOLN
40.0000 [IU] | Freq: Two times a day (BID) | SUBCUTANEOUS | Status: DC
  Administered 2018-05-09: 40 [IU] via SUBCUTANEOUS
  Filled 2018-05-09 (×5): qty 0.4

## 2018-05-09 MED ORDER — NOREPINEPHRINE-SODIUM CHLORIDE 4-0.9 MG/250ML-% IV SOLN
0.0000 ug/min | INTRAVENOUS | Status: DC
  Administered 2018-05-09: 2 ug/min via INTRAVENOUS
  Filled 2018-05-09: qty 250

## 2018-05-09 MED ORDER — INSULIN DETEMIR 100 UNIT/ML ~~LOC~~ SOLN
25.0000 [IU] | Freq: Every day | SUBCUTANEOUS | Status: DC
  Filled 2018-05-09: qty 0.25

## 2018-05-09 MED ORDER — DEXTROSE 50 % IV SOLN
12.5000 g | Freq: Once | INTRAVENOUS | Status: AC
  Administered 2018-05-09: 12.5 g via INTRAVENOUS
  Filled 2018-05-09: qty 50

## 2018-05-09 MED ORDER — PROMETHAZINE HCL 25 MG/ML IJ SOLN
25.0000 mg | Freq: Four times a day (QID) | INTRAMUSCULAR | Status: DC | PRN
  Administered 2018-05-09: 25 mg via INTRAVENOUS
  Filled 2018-05-09: qty 1

## 2018-05-09 NOTE — Progress Notes (Signed)
CRITICAL VALUE ALERT  Critical Value:  412  Date & Time Notied:  05/09/2018.1200  Provider Notified: Dr. Imogene Burnhen   Orders Received/Actions taken: SSI, increased meal coverage insulin + increased levemier.   Pt complaining of nausea, PRN zofran given at 0937. Dr. Imogene Burnhen notified, awaiting call back.

## 2018-05-09 NOTE — Progress Notes (Signed)
Orders in to transfer pt to CCU-14, belongings taken with pt, gf at bedside, bed side report given to [facility] RN.

## 2018-05-09 NOTE — Progress Notes (Signed)
[employer] - [place] at [facility]   PATIENT NAME: Jesse Christian    MR#:  [id number]  DATE OF BIRTH:  Jan 27, 1971  SUBJECTIVE:  CHIEF COMPLAINT:   Chief Complaint  Patient presents with  . Shortness of Breath   Better shortness of breath, off oxygen 2 L by nasal cannula.  Blood glucose 536. REVIEW OF SYSTEMS:  Review of Systems  Constitutional: Positive for malaise/fatigue. Negative for chills and fever.  HENT: Negative for sore throat.   Eyes: Negative for blurred vision and double vision.  Respiratory: Negative for cough, hemoptysis, shortness of breath, wheezing and stridor.   Cardiovascular: Negative for chest pain, palpitations, orthopnea and leg swelling.  Gastrointestinal: Negative for abdominal pain, blood in stool, diarrhea, melena, nausea and vomiting.  Genitourinary: Negative for dysuria, flank pain and hematuria.  Musculoskeletal: Negative for back pain and joint pain.  Skin: Negative for rash.  Neurological: Negative for dizziness, sensory change, focal weakness, seizures, loss of consciousness, weakness and headaches.  Endo/Heme/Allergies: Negative for polydipsia.  Psychiatric/Behavioral: Negative for depression. The patient is not nervous/anxious.     DRUG ALLERGIES:   Allergies  Allergen Reactions  . Lyrica [Pregabalin] Other (See Comments)    Peripheral edema   VITALS:  Blood pressure 121/84, pulse 93, temperature 98 F (36.7 C), temperature source Oral, resp. rate 18, height 6' (1.829 m), weight 72.9 kg, SpO2 97 %. PHYSICAL EXAMINATION:  Physical Exam Constitutional:      General: He is not in acute distress.    Appearance: Normal appearance.  HENT:     Head: Normocephalic.     Mouth/Throat:     Mouth: Mucous membranes are moist.  Eyes:     General: No scleral icterus.    Conjunctiva/sclera: Conjunctivae normal.     Pupils: Pupils are equal, round, and reactive to light.  Neck:     Musculoskeletal: Normal range of motion and  neck supple.     Vascular: No JVD.     Trachea: No tracheal deviation.  Cardiovascular:     Rate and Rhythm: Normal rate and regular rhythm.     Heart sounds: Normal heart sounds. No murmur. No gallop.   Pulmonary:     Effort: Pulmonary effort is normal. No respiratory distress.     Breath sounds: Normal breath sounds. No stridor. No wheezing, rhonchi or rales.  Chest:     Chest wall: No tenderness.  Abdominal:     General: Bowel sounds are normal. There is no distension.     Palpations: Abdomen is soft.     Tenderness: There is no abdominal tenderness. There is no rebound.  Musculoskeletal: Normal range of motion.        General: No tenderness.     Right lower leg: He exhibits no tenderness. No edema.     Left lower leg: He exhibits no tenderness. No edema.  Skin:    Findings: No erythema or rash.  Neurological:     General: No focal deficit present.     Mental Status: He is alert and oriented to person, place, and time.     Cranial Nerves: No cranial nerve deficit.  Psychiatric:        Mood and Affect: Mood normal.    LABORATORY PANEL:  Male CBC Recent Labs  Lab 05/06/18 0724 05/07/18 0304  WBC 10.2  --   HGB 10.3* 10.9*  HCT 32.2*  --   PLT 404*  --    ------------------------------------------------------------------------------------------------------------------ Chemistries  Recent Labs  Lab 05/05/18 2252  05/08/18 0302 05/09/18 0821  NA 130*   < > 133* 128*  K 4.2   < > 4.1 4.5  CL 99   < > 96* 90*  CO2 23   < > 28 29  GLUCOSE 386*   < > 223* 536*  BUN 15   < > 31* 42*  CREATININE 1.14   < > 1.38* 1.67*  CALCIUM 8.6*   < > 8.6* 8.7*  MG  --    < > 1.9  --   AST 15  --   --   --   ALT 12  --   --   --   ALKPHOS 109  --   --   --   BILITOT 0.7  --   --   --    < > = values in this interval not displayed.   RADIOLOGY:  No results found. ASSESSMENT AND PLAN:  This is a 48 year old male admitted for new onset heart failure. 1.   Acute systolic  CHF: Continue IV Lasix and follow-up echo: EF20%. Started Entresto and [employer].  2. NSTEMI due to three-vessel CAD.    Continue aspirin and Lipitor, started Plavix and follow-up nuclear stress test in the office to evaluate for ischemia in the LAD territory before considering for CABG per Dr. Welton Flakes. He is not a good candidate for cardiac rehab. LifeVest ordered for ischemic cardiomyopathy. Transferred to [facility] for possible CABG per Dr. Welton Flakes.   Hold Plavix for possible CABG.  Acute respiratory failure with hypoxia due to above.   Weaned off oxygen.  Hold Lasix due to worsening renal function.  3. Hyperglycemia and diabetes mellitus type 2:  Increased Levemir to 40 units bid, NovoLog 10 units AC and continue sliding scale.  4. Hypertension: Controlled; changed to Entresto and started low-dose Coreg per Dr. Welton Flakes.  5.Hyperlipidemia: LDL 122.  Continue Lipitor. 6. Opiate addiction: Continue methadone as directed by treatment facility. Tobacco abuse.  Smoking cessation was counseled for 3 to 4 minutes.  The patient wants to quit smoking.  ARF.  Worsening, possible due to ATN secondary to cardiac cath and diuretics.  Hold Lasix.  Follow-up BMP.  Hyponatremia.  Follow-up BMP.  On Lasix.  Anemia of chronic disease.  Stable.  Still there is no bed available in [facility] hospital. All the records are reviewed and case discussed with Care Management/Social Worker. Management plans discussed with the patient, his mother and they are in agreement.  CODE STATUS: Full Code  TOTAL TIME TAKING CARE OF THIS PATIENT: 33 minutes.   More than 50% of the time was spent in counseling/coordination of care: YES  POSSIBLE D/C IN 1-2 DAYS, DEPENDING ON CLINICAL CONDITION.   Shaune Pollack M.D on 05/09/2018 at 2:05 PM  Between 7am to 6pm - Pager - [phone]  After 6pm go to [email or web address] - Therapist, nutritional Hospitalists

## 2018-05-09 NOTE — Progress Notes (Signed)
CRITICAL VALUE ALERT  Critical Value:  CBG 54  Date & Time Notied:  05/09/18.1712  Provider Notified: Dr. Imogene Burn  Orders Received/Actions taken: Dr Imogene Burn paged, 25 ML D50 given per hypoglycemia protocol.   Dr. Imogene Burn also notified of hypotension, BP 89/64, HR89, SPO2 94 on R/A. No new orders received.

## 2018-05-09 NOTE — Consult Note (Signed)
Name: Jesse Christian MRN: [id number] DOB: 12-Dec-1970    ADMISSION DATE:  05/05/2018 CONSULTATION DATE: 05/09/2018  REFERRING MD : Dr. Imogene Burn   CHIEF COMPLAINT: Hypotension   BRIEF PATIENT DESCRIPTION: 48 yo male admitted to the telemetry unit with NSTEMI and new onset CHF-EF 20%, cardiac catheterization revealed 2 vessel disease with occluded mid LAD, and high grade lesions of OM1 and OM2 with severe LV dysfunction currently awaiting transfer to [facility].  Transferred to the stepdown unit 01/30 with hypotension and hypoglycemia   SIGNIFICANT EVENTS/STUDIES:  01/27-Pt admitted to the telemetry unit with NSTEMI and new onset CHF with severe LV dysfunction due to ischemic cardiomyopathy LVEF 20% 01/27-Cardiac Catheterization revealed Mid RCA lesion is 60% stenosed, Prox LAD lesion is 100% stenosed, Ost 1st Mrg to 1st Mrg lesion is 85% stenosed, Ost 2nd Mrg to 2nd Mrg lesion is 80% stenosed, Mid Cx lesion is 80% stenosed. Once stable consider CABG. 01/27-Echo revealed systolic function severely reduced EF 20%  HISTORY OF PRESENT ILLNESS:   This is a 48 yo male with a PMH of Hyperlipidemia, Opioid Abuse on Methadone, Hepatitis C, Current Everyday Smoker, Type II Diabetes Mellitus, and Anxiety.  He presented to [facility] ER on 01/26 with c/o shortness of breath with exertion and laying flat onset of symptoms 1 week prior to presentation.  In the ER lab results revealed BNP 2,486 and troponin 1.02 concerning for NSTEMI and newly diagnosed heart failure.  He was subsequently admitted to the telemetry unit and cardiology consulted.  Upon Cardiology evaluation recommended left heart catheterization.  On 05/06/2018 Echo revealed systolic function severely reduced EF 20%.  Pt underwent cardiac catheterization on 05/06/2018 which showed 2 vessel disease with occluded mid LAD, and high grade lesions of OM1 and OM2 with severe LV dysfunction, recommended once stable consider CABG per cardiologist Dr. Welton Flakes. On 05/07/2018  per cardiology pt stable for discharge on 05/08/2018 with plans for outpatient nuclear stress test to evaluate ischemia in the LAD territory before considering CABG.  However, on 05/08/2018 pt and pts mother did not want the pt discharged home requested transfer to [facility], or another facility for further evaluation of pts cardiac status.  On 05/09/2018 pt accepted by [facility] for evaluation for CABG awaiting bed placement and cardiology NP placed order for LifeVest.  However, the evening of 05/09/2018 pt developed diarrhea resulting in poor po intake, he subsequently became hypoglycemic CBG 54 and hypotensive sbp 80's, therefore pt transferred to the stepdown unit for closer monitoring.   PAST MEDICAL HISTORY :   has a past medical history of Diabetes mellitus without complication (HCC), Drug abuse (HCC), Hyperlipidemia, and Right arm fracture (2018).  has a past surgical history that includes LEFT HEART CATH AND CORONARY ANGIOGRAPHY (Right, 05/06/2018). Prior to Admission medications   Medication Sig Start Date End Date Taking? Authorizing Provider  aspirin 81 MG chewable tablet Chew 81 mg by mouth daily.    Yes [provider]  insulin detemir (LEVEMIR) 100 UNIT/ML injection Inject 0.4 mLs (40 Units total) into the skin 2 (two) times daily. Patient taking differently: Inject 30 Units into the skin 2 (two) times daily.  09/25/17  Yes Enid Baas, MD  insulin regular (NOVOLIN R,HUMULIN R) 100 units/mL injection Inject 5-15 Units into the skin 3 (three) times daily before meals. Per sliding scale   Yes [provider]  lisinopril (PRINIVIL,ZESTRIL) 20 MG tablet Take 1 tablet (20 mg total) by mouth daily. 06/22/17  Yes [name], [name], DO  methadone (  DOLOPHINE) 10 MG tablet Take 130 mg by mouth daily.    Yes [provider]  atorvastatin (LIPITOR) 40 MG tablet Take 1 tablet (40 mg total) by mouth daily at 6 PM. 05/08/18   Shaune Pollackhen, Qing, MD  carvedilol (COREG) 6.25 MG tablet Take  1 tablet (6.25 mg total) by mouth 2 (two) times daily with a meal. 05/08/18   Shaune Pollackhen, Qing, MD  clopidogrel (PLAVIX) 75 MG tablet Take 1 tablet (75 mg total) by mouth daily. 05/09/18   Shaune Pollackhen, Qing, MD  furosemide (LASIX) 20 MG tablet Take 1 tablet (20 mg total) by mouth 2 (two) times daily. 05/08/18 05/08/19  Shaune Pollackhen, Qing, MD  nitroGLYCERIN (NITROSTAT) 0.4 MG SL tablet Place 1 tablet (0.4 mg total) under the tongue every 5 (five) minutes as needed for chest pain. 05/08/18 05/08/19  Shaune Pollackhen, Qing, MD  sacubitril-valsartan (ENTRESTO) 24-26 MG Take 1 tablet by mouth 2 (two) times daily. 05/08/18   Shaune Pollackhen, Qing, MD   Allergies  Allergen Reactions  . Lyrica [Pregabalin] Other (See Comments)    Peripheral edema    FAMILY HISTORY:  family history includes Cancer in his maternal aunt; Heart attack in his father; Heart disease in his father; Mental illness in his mother and sister. SOCIAL HISTORY:  reports that he has been smoking cigarettes. He has been smoking about 0.50 packs per day. He has never used smokeless tobacco. He reports that he does not drink alcohol or use drugs.  REVIEW OF SYSTEMS: Positives in BOLD  Constitutional: Negative for fever, chills, weight loss, malaise/fatigue and diaphoresis.  HENT: Negative for hearing loss, ear pain, nosebleeds, congestion, sore throat, neck pain, tinnitus and ear discharge.   Eyes: Negative for blurred vision, double vision, photophobia, pain, discharge and redness.  Respiratory: Negative for cough, hemoptysis, sputum production, shortness of breath, wheezing and stridor.   Cardiovascular: Negative for chest pain, palpitations, orthopnea, claudication, leg swelling and PND.  Gastrointestinal: heartburn, nausea, vomiting, abdominal pain, diarrhea, constipation, blood in stool and melena.  Genitourinary: Negative for dysuria, urgency, frequency, hematuria and flank pain.  Musculoskeletal: Negative for myalgias, back pain, joint pain and falls.  Skin: Negative for  itching and rash.  Neurological: Negative for dizziness, tingling, tremors, sensory change, speech change, focal weakness, seizures, loss of consciousness, weakness and headaches.  Endo/Heme/Allergies: Negative for environmental allergies and polydipsia. Does not bruise/bleed easily.  SUBJECTIVE:  Pt c/o diarrhea   VITAL SIGNS: Temp:  [97.4 F (36.3 C)-98.3 F (36.8 C)] 97.7 F (36.5 C) (01/30 1650) Pulse Rate:  [81-96] 88 (01/30 1808) Resp:  [16-19] 18 (01/30 1704) BP: (78-134)/(56-91) 84/63 (01/30 1808) SpO2:  [92 %-98 %] 97 % (01/30 1808) Weight:  [72.9 kg] 72.9 kg (01/30 0352)  PHYSICAL EXAMINATION: General: acutely ill appearing frail male, NAD  Neuro: alert and oriented, follows commands  HEENT: supple, no JVD  Cardiovascular: nsr, rrr, no R/G Lungs: clear throughout, even, non labored  Abdomen: +BS x4, soft, non tender, non distended  Musculoskeletal: normal bulk and tone, no edema  Skin: intact no rashes or lesions   Recent Labs  Lab 05/07/18 0304 05/08/18 0302 05/09/18 0821  NA 133* 133* 128*  K 4.3 4.1 4.5  CL 96* 96* 90*  CO2 26 28 29   BUN 20 31* 42*  CREATININE 1.46* 1.38* 1.67*  GLUCOSE 195* 223* 536*   Recent Labs  Lab 05/05/18 2252 05/06/18 0724 05/07/18 0304  HGB 10.1* 10.3* 10.9*  HCT 31.1* 32.2*  --   WBC 8.5 10.2  --  PLT 357 404*  --    No results found.  ASSESSMENT / PLAN:  NSTEMI secondary to three vessel disease Newly dx acute systolic CHF with severe LV dysfunction due to ischemic cardiomyopathy, LVEF 20% Cardiogenic Shock  Continuous telemetry monitoring  Maintain map >65, will start levophed gtt if pt remains hypotensive  Cardiology consulted appreciate input  Will hold antihypertensives for now due to hypotension, but will continue all other cardiac medications Supplemental O2 for dyspnea and/or hypoxia  Prn bronchodilator therapy   Acute renal failure  Trend BMP  Replace electrolytes as indicated  Monitor UOP Avoid  nephrotoxic medications   Diarrhea  GI panel pending   Type II Diabetes Mellitus Hypoglycemia likely secondary to poor po intake  CBG's q1hr for now  Will hold SSI dose for now   Sonda Rumbleana Seerat Peaden, [facility]NP  Pulmonary/Critical Care Pager [phone] (please enter 7 digits) PCCM Consult Pager [phone] (please enter 7 digits)

## 2018-05-09 NOTE — Progress Notes (Signed)
SUBJECTIVE: Patient is feeling less short of breath today but reports GI upset that he states usually leads to severe diarrhea.   Vitals:   05/08/18 1729 05/08/18 1933 05/09/18 0352 05/09/18 0724  BP: 109/81 131/86 (!) 134/91 130/84  Pulse: 81 81 92 95  Resp:  16 16   Temp:  (!) 97.4 F (36.3 C) 98.3 F (36.8 C) 98 F (36.7 C)  TempSrc:  Oral Oral Oral  SpO2: 93% 96% 92% 96%  Weight:   72.9 kg   Height:        Intake/Output Summary (Last 24 hours) at 05/09/2018 0854 Last data filed at 05/09/2018 0743 Gross per 24 hour  Intake 0 ml  Output 3200 ml  Net -3200 ml    LABS: Basic Metabolic Panel: Recent Labs    05/07/18 0304 05/08/18 0302 05/09/18 0821  NA 133* 133* 128*  K 4.3 4.1 4.5  CL 96* 96* 90*  CO2 26 28 29   GLUCOSE 195* 223* 536*  BUN 20 31* 42*  CREATININE 1.46* 1.38* 1.67*  CALCIUM 8.6* 8.6* 8.7*  MG 1.9 1.9  --    Liver Function Tests: No results for input(s): AST, ALT, ALKPHOS, BILITOT, PROT, ALBUMIN in the last 72 hours. No results for input(s): LIPASE, AMYLASE in the last 72 hours. CBC: Recent Labs    05/07/18 0304  HGB 10.9*   Cardiac Enzymes: No results for input(s): CKTOTAL, CKMB, CKMBINDEX, TROPONINI in the last 72 hours. BNP: Invalid input(s): POCBNP D-Dimer: No results for input(s): DDIMER in the last 72 hours. Hemoglobin A1C: No results for input(s): HGBA1C in the last 72 hours. Fasting Lipid Panel: Recent Labs    05/07/18 0304  CHOL 193  HDL 41  LDLCALC 122*  TRIG 151*  CHOLHDL 4.7   Thyroid Function Tests: No results for input(s): TSH, T4TOTAL, T3FREE, THYROIDAB in the last 72 hours.  Invalid input(s): FREET3 Anemia Panel: No results for input(s): VITAMINB12, FOLATE, FERRITIN, TIBC, IRON, RETICCTPCT in the last 72 hours.   PHYSICAL EXAM General: Thin, pale, short of breath with any exertion HEENT:  Normocephalic and atramatic Neck:   No JVD.  Lungs: Clear bilaterally to auscultation and percussion. Heart: HRRR .  Normal S1 and S2 without gallops or murmurs.  Abdomen: Bowel sounds are positive, abdomen soft and non-tender  Msk:  Back normal, normal gait. Normal strength and tone for age. Extremities: No clubbing, cyanosis or edema.   Neuro: Alert and oriented X 3. Psych:  Good affect, responds appropriately  TELEMETRY: NSR 91bpm  ASSESSMENT AND PLAN:  Congestive heart failure with severe LV dysfunction due to ischemic cardiomyopathy LVEF 20%.  Cath shows occluded mid LAD with high-grade lesion in OM1 and OM 2 and 60% stenosis in RCA.  Continue Entresto and carvedilol 6.25mg  BID. LifeVest ordered for ischemic cardiomyopathy,awaiting bed for transfer to [facility] for evaluation for CABG.  Active Problems:   Acute systolic heart failure (HCC)   Malnutrition of moderate degree    Caroleen Hamman, NP-C 05/09/2018 8:54 AM Cell: [phone]

## 2018-05-09 NOTE — Care Management (Signed)
Pending transfer to [facility].  No bed available at this time.

## 2018-05-09 NOTE — Progress Notes (Signed)
CRITICAL VALUE ALERT  Critical Value:  CBG 475  Date & Time Notied:  05/09/2018.0811  Provider Notified: Dr. Imogene Burn  Orders Received/Actions taken: see orders

## 2018-05-09 NOTE — Progress Notes (Signed)
[facility] [phone]) regarding bed status,  Was informed that they have pending discharges but no available bed at the moment. Pt informed.

## 2018-05-09 NOTE — Progress Notes (Addendum)
Paged Dr. Imogene Burn x3, no response Pt remains hypotensive, BP not improving Systolic in the 80's, blood sugar came up to 149 after D50. ICU nurse called in to assess pt. Stacy, CCU nurse at the bedside. Valorie Roosevelt, NP (cardiology) also notified of hypotension but no new orders received. Will continue to monitor.

## 2018-05-09 NOTE — Progress Notes (Addendum)
Inpatient Diabetes Program Recommendations  AACE/ADA: New Consensus Statement on Inpatient Glycemic Control   Target Ranges:  Prepandial:   less than 140 mg/dL      Peak postprandial:   less than 180 mg/dL (1-2 hours)      Critically ill patients:  140 - 180 mg/dL   Results for SKILAR, LEFRANCOIS (MRN [id number]) as of 05/09/2018 13:08  Ref. Range 05/08/2018 07:22 05/08/2018 12:10 05/08/2018 16:46 05/08/2018 21:13 05/09/2018 07:23 05/09/2018 11:58  Glucose-Capillary Latest Ref Range: 70 - 99 mg/dL 263 (H)  Novolog 13 units  Levemir 18 units  Ensure Max Protein @ 9:45 453 (H)  Novolog 23 units   142 (H)  Novolog 10 units 181 (H)        Ensure Max Protein @ 21:35 475 (H)  Novolog 23 units  Levemir 40 units  Ensure Max Protein @ 9:43 412 (H)  Novolog 25 units   Review of Glycemic Control  Diabetes history: DM2 Outpatient Diabetes medications: Levemir 30 units BID, Regular 5-15 units TID with meals Current orders for Inpatient glycemic control: Levemir 40 units BID, Novolog 10 units TID with meals, Novolog 0-15 units TID with meals, Novolog 0-5 units QHS  Inpatient Diabetes Program Recommendations:   Insulin: Noted Levemir was increased to 40 units BID and meal coverage to Novolog 10 units TID today. Also noted that Novolog correction scale increased on 05/08/18.  Diet: Noted patient received Ensure Max Protein last night and again this morning and following glucose noted to be over 400 mg/dl each time.  Per RN, RD reports Ensure Max has less carbohydrates than Glucerna and would recommend continuing with Ensure Max as preferred supplement.   Will follow with insulin changes made today.  Thanks, Orlando Penner, RN, MSN, CDE Diabetes Coordinator Inpatient Diabetes Program [phone] (Team Pager from 8am to 5pm)

## 2018-05-09 NOTE — Progress Notes (Signed)
eLink Physician-Brief Progress Note Patient Name: Jesse Christian DOB: [birth year] MRN: [id number]   Date of Service  05/09/2018  HPI/Events of Note  48 y/o M opioid abuse on methadone, hep C, smoker, DM and dyslipidemia presented on 1/26 due to dyspnea and orthopnea. NSTEMI underwent LHC EF 20% mid LAD, and RCA lesions. Transferred for possible CABG. Moved to ICU tonight due to hypotension and hypoglycemia.  eICU Interventions  Patient without clinical signs of hypoperfusion. If BP remains low, consider inotropes. Diuresis on hold as patient creatinine trended up and sodium low. Cautious hydration if needed.        Darl Pikes 05/09/2018, 7:54 PM

## 2018-05-09 NOTE — Progress Notes (Signed)
Cardiovascular and Pulmonary Nurse Navigator Note:    This RN contacted [facility] ([facility]) at [phone] to see if they are anticipating a bed for patient today.  This RN was informed that at this point it was unlikely that patient would get a bed today.  The only way would be if there are late discharges.  Assigned RN and patient updated.    Army Melia, RN, BSN, [facility]  [place]  [facility] Cardiac & Pulmonary Rehab  Cardiovascular & Pulmonary Nurse Navigator  Direct Line: [phone]  Department Phone #: [phone] Fax: [phone]  Email Address: [email or web address]

## 2018-05-10 DIAGNOSIS — I214 Non-ST elevation (NSTEMI) myocardial infarction: Principal | ICD-10-CM

## 2018-05-10 LAB — CBC
HCT: 32.8 % — ABNORMAL LOW (ref 39.0–52.0)
Hemoglobin: 10.6 g/dL — ABNORMAL LOW (ref 13.0–17.0)
MCH: 27.9 pg (ref 26.0–34.0)
MCHC: 32.3 g/dL (ref 30.0–36.0)
MCV: 86.3 fL (ref 80.0–100.0)
Platelets: 474 10*3/uL — ABNORMAL HIGH (ref 150–400)
RBC: 3.8 MIL/uL — ABNORMAL LOW (ref 4.22–5.81)
RDW: 12.7 % (ref 11.5–15.5)
WBC: 8.4 10*3/uL (ref 4.0–10.5)
nRBC: 0 % (ref 0.0–0.2)

## 2018-05-10 LAB — BASIC METABOLIC PANEL
ANION GAP: 11 (ref 5–15)
BUN: 68 mg/dL — ABNORMAL HIGH (ref 6–20)
CO2: 27 mmol/L (ref 22–32)
Calcium: 8.3 mg/dL — ABNORMAL LOW (ref 8.9–10.3)
Chloride: 95 mmol/L — ABNORMAL LOW (ref 98–111)
Creatinine, Ser: 2.35 mg/dL — ABNORMAL HIGH (ref 0.61–1.24)
GFR calc Af Amer: 37 mL/min — ABNORMAL LOW (ref >60)
GFR calc non Af Amer: 32 mL/min — ABNORMAL LOW (ref >60)
Glucose, Bld: 226 mg/dL — ABNORMAL HIGH (ref 70–99)
Potassium: 3.9 mmol/L (ref 3.5–5.1)
Sodium: 133 mmol/L — ABNORMAL LOW (ref 135–145)

## 2018-05-10 LAB — GLUCOSE, CAPILLARY
Glucose-Capillary: 185 mg/dL — ABNORMAL HIGH (ref 70–99)
Glucose-Capillary: 196 mg/dL — ABNORMAL HIGH (ref 70–99)
Glucose-Capillary: 229 mg/dL — ABNORMAL HIGH (ref 70–99)
Glucose-Capillary: 251 mg/dL — ABNORMAL HIGH (ref 70–99)
Glucose-Capillary: 259 mg/dL — ABNORMAL HIGH (ref 70–99)
Glucose-Capillary: 345 mg/dL — ABNORMAL HIGH (ref 70–99)

## 2018-05-10 LAB — BRAIN NATRIURETIC PEPTIDE: B Natriuretic Peptide: 590 pg/mL — ABNORMAL HIGH (ref 0.0–100.0)

## 2018-05-10 LAB — MAGNESIUM: MAGNESIUM: 2.2 mg/dL (ref 1.7–2.4)

## 2018-05-10 MED ORDER — INSULIN DETEMIR 100 UNIT/ML ~~LOC~~ SOLN
20.0000 [IU] | Freq: Two times a day (BID) | SUBCUTANEOUS | Status: DC
  Administered 2018-05-10 – 2018-05-11 (×4): 20 [IU] via SUBCUTANEOUS
  Filled 2018-05-10 (×7): qty 0.2

## 2018-05-10 MED ORDER — NOREPINEPHRINE-SODIUM CHLORIDE 4-0.9 MG/250ML-% IV SOLN: 0.0000 ug/min | INTRAVENOUS | Status: DC

## 2018-05-10 MED ORDER — INSULIN ASPART 100 UNIT/ML ~~LOC~~ SOLN
8.0000 [IU] | Freq: Three times a day (TID) | SUBCUTANEOUS | Status: DC
  Administered 2018-05-10 – 2018-05-11 (×3): 8 [IU] via SUBCUTANEOUS
  Filled 2018-05-10 (×3): qty 1

## 2018-05-10 NOTE — Progress Notes (Addendum)
SUBJECTIVE: Feeling well, GI symptoms are improving, no significant shortness of breath.    Vitals:   05/10/18 0530 05/10/18 0600 05/10/18 0630 05/10/18 0700  BP: 94/69 99/72 107/79 109/80  Pulse: 86 82 80 79  Resp: 13 13 13 15   Temp:      TempSrc:      SpO2: 95% 96% 96% 96%  Weight:      Height:        Intake/Output Summary (Last 24 hours) at 05/10/2018 0846 Last data filed at 05/10/2018 0439 Gross per 24 hour  Intake 49.01 ml  Output 375 ml  Net -325.99 ml    LABS: Basic Metabolic Panel: Recent Labs    05/08/18 0302 05/09/18 0821 05/10/18 0451  NA 133* 128* 133*  K 4.1 4.5 3.9  CL 96* 90* 95*  CO2 28 29 27   GLUCOSE 223* 536* 226*  BUN 31* 42* 68*  CREATININE 1.38* 1.67* 2.35*  CALCIUM 8.6* 8.7* 8.3*  MG 1.9  --  2.2   Liver Function Tests: No results for input(s): AST, ALT, ALKPHOS, BILITOT, PROT, ALBUMIN in the last 72 hours. No results for input(s): LIPASE, AMYLASE in the last 72 hours. CBC: Recent Labs    05/10/18 0451  WBC 8.4  HGB 10.6*  HCT 32.8*  MCV 86.3  PLT 474*   Cardiac Enzymes: No results for input(s): CKTOTAL, CKMB, CKMBINDEX, TROPONINI in the last 72 hours. BNP: Invalid input(s): POCBNP D-Dimer: No results for input(s): DDIMER in the last 72 hours. Hemoglobin A1C: No results for input(s): HGBA1C in the last 72 hours. Fasting Lipid Panel: No results for input(s): CHOL, HDL, LDLCALC, TRIG, CHOLHDL, LDLDIRECT in the last 72 hours. Thyroid Function Tests: No results for input(s): TSH, T4TOTAL, T3FREE, THYROIDAB in the last 72 hours.  Invalid input(s): FREET3 Anemia Panel: No results for input(s): VITAMINB12, FOLATE, FERRITIN, TIBC, IRON, RETICCTPCT in the last 72 hours.   PHYSICAL EXAM General:Pale, thin HEENT:  Normocephalic and atramatic Neck:  No JVD.  Lungs: Clear bilaterally to auscultation and percussion. Heart: HRRR . Normal S1 and S2 without gallops or murmurs.  Abdomen: Bowel sounds are positive, abdomen soft and  non-tender  Msk:  Back normal, normal gait. Normal strength and tone for age. Extremities: No clubbing, cyanosis or edema.   Neuro: Alert and oriented X 3. Psych:  Good affect, responds appropriately  TELEMETRY: NSR 79bpm  ASSESSMENT AND PLAN: Pt had an episode of hypotension and hypoglycemia with GI upset. Was transferred to ICU for observation. Pressures have been stable, pt is not short of breath or having chest pain.  Entresto and coreg on hold for now due to low BP, CHFLVEF 20%.  Cath shows occluded mid LAD with high-grade lesion in OM1 and OM 2 and60% stenosisin RCA. Marland Kitchen LifeVest ordered for ischemic cardiomyopathy, still awaiting bed for transfer to [facility] for evaluation forCABG.  Addendum: LifeVest to be fitted tonight. Discharge tomorrow if kidney function and shortness of breath has improved. Outpatient follow up with Dr. Welton Flakes Monday 10am, has outpatient appointment set with [facility] Wednesday 05/15/18.  Active Problems:   Acute systolic heart failure (HCC)   Malnutrition of moderate degree    Jesse Hamman, NP-C 05/10/2018 8:46 AM Cell: [phone]

## 2018-05-10 NOTE — Care Management (Addendum)
Transfer request was actually made to [facility] and not [facility]. CM contacted transfer center and confirmed remains on wait list but unable to predict when bed will open.  Reached out to Cardiology, intensivist and hospitalist to request initiate transfer requests to other facilities.  Patient does not have a preference. Cardiology replied that spent hours the previous day and "no luck."  CM questioned cardiology FNP to determine "reason" for lack of success: no beds or no accepting physician.  CM discussed if it was due to no beds or lack of accepting physician.  Reason unclear.  Discussed with FNP that if it is lack of available beds- need to follow up daily with all facilities. If it is lack of accepting physician, then that would  completely take a facility off the list. Cardiology wishes to defer transfer coordination to hospitalist and hospitalist says that cardiologist should make referrals to a cardiovascular surgeon.  CM found out that a referral was sent to [facility] for life vest 05/08/18 from cardiology office..  CM found that patient's insurance has approved and will be onsite to fit this afternoon.  Updated cardiology FNP,  primary nurse and patient, his mother and girlfriend.CM discussedt with patient, his mother and girlfriend, that when he is medically stable he will discharge home and appointments/follow up for cabg consideration can be done as outpatient- per cardiology.   If patient receives life vest and gets the bed at [facility]- the vest can transport with him. Hospitalist stated to CM patient is not medically stable for discharge today

## 2018-05-10 NOTE — Progress Notes (Signed)
Pastoral Care Visit   05/10/18 1100  Clinical Encounter Type  Visited With Patient  Visit Type Follow-up;Psychological support;Spiritual support  Referral From Chaplain  Consult/Referral To Chaplain  Stress Factors  Patient Stress Factors Not reviewed   Chap visited pt by rqst of prev visiting chap.  Pt declined interest in pastoral visit. Pt indicated that his mom's pastor has come to see him.  Mirna Mires let him know there is a chap on call available for emotional and spiritual support 24/7.  Milinda Antis, [address]

## 2018-05-10 NOTE — Progress Notes (Addendum)
Inpatient Diabetes Program Recommendations  AACE/ADA: New Consensus Statement on Inpatient Glycemic Control   Target Ranges:  Prepandial:   less than 140 mg/dL      Peak postprandial:   less than 180 mg/dL (1-2 hours)      Critically ill patients:  140 - 180 mg/dL   Results for Jesse Christian, Jesse Christian (MRN [id number]) as of 05/10/2018 08:54  Ref. Range 05/09/2018 07:23 05/09/2018 11:58 05/09/2018 16:10 05/09/2018 17:07 05/09/2018 17:41 05/09/2018 18:50 05/09/2018 21:31 05/10/2018 00:01 05/10/2018 03:42 05/10/2018 08:12  Glucose-Capillary Latest Ref Range: 70 - 99 mg/dL 697 (H)  Novolog 23 units @ 8:20  Levemir 40 units @ 9:43 412 (H)  Novolog 25 units @ 12:40 71 54 (L) 149 (H) 145 (H) 206 (H)  Novolog 2 units @ 21:43 185 (H) 196 (H) 229 (H)   Review of Glycemic Control  Diabetes history: DM2  Outpatient Diabetes medications: Levemir 30 units BID, Regular 5-15 units TID with meals Current orders for Inpatient glycemic control: Levemir 25 units daily Novolog 5 units TID with meals, Novolog 0-15 units TID with meals, Novolog 0-5 units QHS  Inpatient Diabetes Program Recommendations:   Insulin- Basal: Noted Levemir was decreased to Levemir 25 units daily.  Please consider increasing Levemir dose back up to at least 40 units daily. MD could split the dose in half and give as Levemir 20 units BID if preferred.  Insulin-Correction: If patient is not eating well, please consider changing frequency of CBGs and Novolog to Q4H.  NURSING: Please be sure patient is eating before giving Novolog 5 units TID which is for meal coverage if patient eats at least 50% of meals.  NOTE: In reviewing chart, noted patient received Levemir 40 units at 9:43 am on 05/09/18. Patient experienced hypoglycemia on 05/09/18. Anticipate hypoglycemia was due to getting large dosages of Novolog for correction and meal coverage and per chart, patient is NOT eating so should not have received meal coverage. Recommend increasing Levemir  dose back up to at least 40 units daily; could split dose and give Levemir 20 units BID.  Thanks, Orlando Penner, RN, MSN, CDE Diabetes Coordinator Inpatient Diabetes Program [phone] (Team Pager from 8am to 5pm)

## 2018-05-10 NOTE — Plan of Care (Signed)
  Problem: Education: Goal: Ability to demonstrate management of disease process will improve Outcome: Progressing   Problem: Education: Goal: Ability to verbalize understanding of medication therapies will improve Outcome: Progressing   Problem: Activity: Goal: Capacity to carry out activities will improve Outcome: Progressing   Problem: Cardiac: Goal: Ability to achieve and maintain adequate cardiopulmonary perfusion will improve Outcome: Progressing   

## 2018-05-10 NOTE — Progress Notes (Signed)
[employer] - [place] [facility]   PATIENT NAME: Jesse Christian    MR#:  [id number]  DATE OF BIRTH:  09/26/1970  SUBJECTIVE:  CHIEF COMPLAINT:   Chief Complaint  Patient presents with  . Shortness of Breath   The patient was transferred to stepdown unit due to hypotension.  Blood pressure is better.  The patient feels better. BS 345. REVIEW OF SYSTEMS:  Review of Systems  Constitutional: Positive for malaise/fatigue. Negative for chills and fever.  HENT: Negative for sore throat.   Eyes: Negative for blurred vision and double vision.  Respiratory: Negative for cough, hemoptysis, shortness of breath, wheezing and stridor.   Cardiovascular: Negative for chest pain, palpitations, orthopnea and leg swelling.  Gastrointestinal: Negative for abdominal pain, blood in stool, diarrhea, melena, nausea and vomiting.  Genitourinary: Negative for dysuria, flank pain and hematuria.  Musculoskeletal: Negative for back pain and joint pain.  Skin: Negative for rash.  Neurological: Negative for dizziness, sensory change, focal weakness, seizures, loss of consciousness, weakness and headaches.  Endo/Heme/Allergies: Negative for polydipsia.  Psychiatric/Behavioral: Negative for depression. The patient is not nervous/anxious.     DRUG ALLERGIES:   Allergies  Allergen Reactions  . Lyrica [Pregabalin] Other (See Comments)    Peripheral edema   VITALS:  Blood pressure 114/89, pulse 94, temperature 98.5 F (36.9 C), temperature source Oral, resp. rate 14, height 6' (1.829 m), weight 70.8 kg, SpO2 95 %. PHYSICAL EXAMINATION:  Physical Exam Constitutional:      General: He is not in acute distress.    Appearance: Normal appearance.  HENT:     Head: Normocephalic.     Mouth/Throat:     Mouth: Mucous membranes are moist.  Eyes:     General: No scleral icterus.    Conjunctiva/sclera: Conjunctivae normal.     Pupils: Pupils are equal, round, and reactive to light.  Neck:       Musculoskeletal: Normal range of motion and neck supple.     Vascular: No JVD.     Trachea: No tracheal deviation.  Cardiovascular:     Rate and Rhythm: Normal rate and regular rhythm.     Heart sounds: Normal heart sounds. No murmur. No gallop.   Pulmonary:     Effort: Pulmonary effort is normal. No respiratory distress.     Breath sounds: Normal breath sounds. No stridor. No wheezing, rhonchi or rales.  Chest:     Chest wall: No tenderness.  Abdominal:     General: Bowel sounds are normal. There is no distension.     Palpations: Abdomen is soft.     Tenderness: There is no abdominal tenderness. There is no rebound.  Musculoskeletal: Normal range of motion.        General: No tenderness.     Right lower leg: He exhibits no tenderness. No edema.     Left lower leg: He exhibits no tenderness. No edema.  Skin:    Findings: No erythema or rash.  Neurological:     General: No focal deficit present.     Mental Status: He is alert and oriented to person, place, and time.     Cranial Nerves: No cranial nerve deficit.  Psychiatric:        Mood and Affect: Mood normal.    LABORATORY PANEL:  Male CBC Recent Labs  Lab 05/10/18 0451  WBC 8.4  HGB 10.6*  HCT 32.8*  PLT 474*   ------------------------------------------------------------------------------------------------------------------ Chemistries  Recent Labs  Lab 05/05/18 2252  05/10/18 0451  NA 130*   < > 133*  K 4.2   < > 3.9  CL 99   < > 95*  CO2 23   < > 27  GLUCOSE 386*   < > 226*  BUN 15   < > 68*  CREATININE 1.14   < > 2.35*  CALCIUM 8.6*   < > 8.3*  MG  --    < > 2.2  AST 15  --   --   ALT 12  --   --   ALKPHOS 109  --   --   BILITOT 0.7  --   --    < > = values in this interval not displayed.   RADIOLOGY:  No results found. ASSESSMENT AND PLAN:  This is a 48 year old male admitted for new onset heart failure. 1.   Acute systolic CHF: Continue IV Lasix and follow-up echo: EF20%. Started Entresto  and [employer].  2. NSTEMI due to three-vessel CAD.    Continue aspirin and Lipitor, started Plavix and follow-up nuclear stress test in the office to evaluate for ischemia in the LAD territory before considering for CABG per Dr. Welton Flakes. He is not a good candidate for cardiac rehab. LifeVest ordered for ischemic cardiomyopathy. Transferred to [facility] for possible CABG per Dr. Welton Flakes.   Hold Plavix for possible CABG.  Acute respiratory failure with hypoxia due to above.   Weaned off oxygen.  Hold Lasix due to worsening renal function.  3. Hyperglycemia and diabetes mellitus type 2:  Levemir to 20 units bid, NovoLog 8 units AC and continue sliding scale. Hypoglycemia improved.  But hyperglycemia again.  4. Hypertension: Controlled; changed to Entresto and started low-dose Coreg per Dr. Welton Flakes. Hold due to hypotension.  5.Hyperlipidemia: LDL 122.  Continue Lipitor. 6. Opiate addiction: Continue methadone as directed by treatment facility. Tobacco abuse.  Smoking cessation was counseled for 3 to 4 minutes.  The patient wants to quit smoking.  ARF.  Worsening, possible due to ATN secondary to cardiac cath and diuretics.  Hold Lasix and Entresto.  Follow-up BMP.  Hyponatremia.  Follow-up BMP.  Hold Lasix.  Anemia of chronic disease.  Stable.   Still there is no bed available in [facility]. All the records are reviewed and case discussed with Care Management/Social Worker. Management plans discussed with the patient, his mother and they are in agreement.  CODE STATUS: Full Code  TOTAL TIME TAKING CARE OF THIS PATIENT: 28 minutes.   More than 50% of the time was spent in counseling/coordination of care: YES  POSSIBLE D/C IN 2-3 DAYS, DEPENDING ON CLINICAL CONDITION.   Jesse Christian M.D on 05/10/2018 at 3:27 PM  Between 7am to 6pm - Pager - [phone]  After 6pm go to [email or web address] - Therapist, nutritional Hospitalists

## 2018-05-10 NOTE — Progress Notes (Signed)
Report called to [facility] on 2A. Patient was trasferred to room 240 via wheelchair with oxygen 2 L nasal cannula.

## 2018-05-11 HISTORY — PX: CARDIAC SURGERY: SHX584

## 2018-05-11 LAB — BASIC METABOLIC PANEL
Anion gap: 9 (ref 5–15)
BUN: 52 mg/dL — AB (ref 6–20)
CHLORIDE: 96 mmol/L — AB (ref 98–111)
CO2: 31 mmol/L (ref 22–32)
CREATININE: 1.66 mg/dL — AB (ref 0.61–1.24)
Calcium: 8.7 mg/dL — ABNORMAL LOW (ref 8.9–10.3)
GFR calc Af Amer: 57 mL/min — ABNORMAL LOW (ref >60)
GFR calc non Af Amer: 49 mL/min — ABNORMAL LOW (ref >60)
Glucose, Bld: 127 mg/dL — ABNORMAL HIGH (ref 70–99)
Potassium: 3.6 mmol/L (ref 3.5–5.1)
SODIUM: 136 mmol/L (ref 135–145)

## 2018-05-11 LAB — GLUCOSE, CAPILLARY
Glucose-Capillary: 197 mg/dL — ABNORMAL HIGH (ref 70–99)
Glucose-Capillary: 193 mg/dL — ABNORMAL HIGH (ref 70–99)
Glucose-Capillary: 74 mg/dL (ref 70–99)
Glucose-Capillary: 121 mg/dL — ABNORMAL HIGH (ref 70–99)

## 2018-05-11 MED ORDER — CLOPIDOGREL BISULFATE 75 MG PO TABS
75.0000 mg | ORAL_TABLET | Freq: Every day | ORAL | Status: DC
  Administered 2018-05-11: 75 mg via ORAL
  Filled 2018-05-11: qty 1

## 2018-05-11 MED ORDER — ALUM & MAG HYDROXIDE-SIMETH 200-200-20 MG/5ML PO SUSP
30.0000 mL | Freq: Four times a day (QID) | ORAL | Status: DC | PRN
  Administered 2018-05-11: 30 mL via ORAL
  Filled 2018-05-11: qty 30

## 2018-05-11 MED ORDER — METOCLOPRAMIDE HCL 10 MG PO TABS: 10.0000 mg | ORAL_TABLET | Freq: Four times a day (QID) | ORAL | Status: DC | PRN

## 2018-05-11 NOTE — Discharge Instructions (Signed)
Smoking cessation. Heart healthy and ADA diet. outpatient appointment set with [facility] Cardiology Wednesday 05/15/18.

## 2018-05-11 NOTE — Progress Notes (Signed)
[employer] - [place] [facility]   PATIENT NAME: Jesse Christian    MR#:  [id number]  DATE OF BIRTH:  09-08-1970  SUBJECTIVE:  CHIEF COMPLAINT:   Chief Complaint  Patient presents with  . Shortness of Breath   The patient complains of nausea but denies any abdominal pain, vomiting or diarrhea.  Hypotension improved.  Blood sugars better controlled. REVIEW OF SYSTEMS:  Review of Systems  Constitutional: Positive for malaise/fatigue. Negative for chills and fever.  HENT: Negative for sore throat.   Eyes: Negative for blurred vision and double vision.  Respiratory: Negative for cough, hemoptysis, shortness of breath, wheezing and stridor.   Cardiovascular: Negative for chest pain, palpitations, orthopnea and leg swelling.  Gastrointestinal: Positive for nausea. Negative for abdominal pain, blood in stool, diarrhea, melena and vomiting.  Genitourinary: Negative for dysuria, flank pain and hematuria.  Musculoskeletal: Negative for back pain and joint pain.  Skin: Negative for rash.  Neurological: Negative for dizziness, sensory change, focal weakness, seizures, loss of consciousness, weakness and headaches.  Endo/Heme/Allergies: Negative for polydipsia.  Psychiatric/Behavioral: Negative for depression. The patient is not nervous/anxious.     DRUG ALLERGIES:   Allergies  Allergen Reactions  . Lyrica [Pregabalin] Other (See Comments)    Peripheral edema   VITALS:  Blood pressure 116/81, pulse (!) 104, temperature 98.5 F (36.9 C), temperature source Oral, resp. rate 16, height 6' (1.829 m), weight 68.7 kg, SpO2 97 %. PHYSICAL EXAMINATION:  Physical Exam Constitutional:      General: He is not in acute distress.    Appearance: Normal appearance.  HENT:     Head: Normocephalic.     Mouth/Throat:     Mouth: Mucous membranes are moist.  Eyes:     General: No scleral icterus.    Conjunctiva/sclera: Conjunctivae normal.     Pupils: Pupils are equal, round,  and reactive to light.  Neck:     Musculoskeletal: Normal range of motion and neck supple.     Vascular: No JVD.     Trachea: No tracheal deviation.  Cardiovascular:     Rate and Rhythm: Normal rate and regular rhythm.     Heart sounds: Normal heart sounds. No murmur. No gallop.   Pulmonary:     Effort: Pulmonary effort is normal. No respiratory distress.     Breath sounds: Normal breath sounds. No stridor. No wheezing, rhonchi or rales.  Chest:     Chest wall: No tenderness.  Abdominal:     General: Bowel sounds are normal. There is no distension.     Palpations: Abdomen is soft.     Tenderness: There is no abdominal tenderness. There is no rebound.  Musculoskeletal: Normal range of motion.        General: No tenderness.     Right lower leg: He exhibits no tenderness. No edema.     Left lower leg: He exhibits no tenderness. No edema.  Skin:    Findings: No erythema or rash.  Neurological:     General: No focal deficit present.     Mental Status: He is alert and oriented to person, place, and time.     Cranial Nerves: No cranial nerve deficit.  Psychiatric:        Mood and Affect: Mood normal.    LABORATORY PANEL:  Male CBC Recent Labs  Lab 05/10/18 0451  WBC 8.4  HGB 10.6*  HCT 32.8*  PLT 474*   ------------------------------------------------------------------------------------------------------------------ Chemistries  Recent Labs  Lab 05/05/18 2252  05/10/18 0451 05/11/18 0411  NA 130*   < > 133* 136  K 4.2   < > 3.9 3.6  CL 99   < > 95* 96*  CO2 23   < > 27 31  GLUCOSE 386*   < > 226* 127*  BUN 15   < > 68* 52*  CREATININE 1.14   < > 2.35* 1.66*  CALCIUM 8.6*   < > 8.3* 8.7*  MG  --    < > 2.2  --   AST 15  --   --   --   ALT 12  --   --   --   ALKPHOS 109  --   --   --   BILITOT 0.7  --   --   --    < > = values in this interval not displayed.   RADIOLOGY:  No results found. ASSESSMENT AND PLAN:  This is a 48 year old male admitted for new  onset heart failure. 1.   Acute systolic CHF: Continue IV Lasix and follow-up echo: EF20%. Started Entresto and [employer].  But on hold due to hypotension and renal failure.  2. NSTEMI due to three-vessel CAD.    Continue aspirin and Lipitor, started Plavix and follow-up nuclear stress test in the office to evaluate for ischemia in the LAD territory before considering for CABG per Dr. Welton Flakes. He is not a good candidate for cardiac rehab. LifeVest ordered for ischemic cardiomyopathy. Transferred to [facility] for possible CABG per Dr. Welton Flakes.   Hold Plavix for possible CABG. resume Plavix since no plan of transfer to [facility] hospital. Per Dr. Welton Flakes, follow-up with Dr. Welton Flakes this Monday and follow-up [facility] cardiologist on May 15, 2018.  Acute respiratory failure with hypoxia due to above.   Weaned off oxygen.  Hold Lasix due to worsening renal function.  3. Hyperglycemia and diabetes mellitus type 2:  Levemir to 20 units bid, NovoLog 8 units AC and continue sliding scale. Hypoglycemia improved.   4. Hypertension: Controlled; changed to Entresto and started low-dose Coreg per Dr. Welton Flakes. Hold due to hypotension.  5.Hyperlipidemia: LDL 122.  Continue Lipitor. 6. Opiate addiction: Continue methadone as directed by treatment facility. Tobacco abuse.  Smoking cessation was counseled for 3 to 4 minutes.  The patient wants to quit smoking.  ARF.  possible due to ATN secondary to cardiac cath and diuretics.   Hold Lasix and Entresto.   Improving, follow-up BMP.  Hyponatremia.  Improved.  Anemia of chronic disease.  Stable.   Nausea.  The patient has nausea almost once a month for the past 1 year.  Possible related to diabetes.  He needs to follow-up PCP.  Changed to Reglan as needed.  Per Dr. Welton Flakes, may discharge to home today if renal function improves. All the records are reviewed and case discussed with Care Management/Social Worker. Management plans discussed with the patient, his mother and they  are in agreement.  CODE STATUS: Full Code  TOTAL TIME TAKING CARE OF THIS PATIENT: 28 minutes.   More than 50% of the time was spent in counseling/coordination of care: YES  POSSIBLE D/C IN 1-2 DAYS, DEPENDING ON CLINICAL CONDITION.   Shaune Pollack M.D on 05/11/2018 at 11:58 AM  Between 7am to 6pm - Pager - [phone]  After 6pm go to [email or web address] - Therapist, nutritional Hospitalists

## 2018-05-11 NOTE — Progress Notes (Signed)
Notify Dr. Imogene Burn about patient's blood sugar at 74, asked if we need to adjust his insulin, since he is on a sliding scale and is also receiving 8 units on top of that with meals, discontinue the 8 units of novolog. RN will continue to monitor.

## 2018-05-12 ENCOUNTER — Ambulatory Visit (HOSPITAL_COMMUNITY)
Admission: AD | Admit: 2018-05-12 | Discharge: 2018-05-12 | Disposition: A | Discharge status: Home / Self Care | Payer: 59 | Source: Other Acute Inpatient Hospital | Attending: Internal Medicine | Admitting: Internal Medicine

## 2018-05-12 DIAGNOSIS — I509 Heart failure, unspecified: Secondary | ICD-10-CM | POA: Insufficient documentation

## 2018-05-12 LAB — BASIC METABOLIC PANEL
ANION GAP: 10 (ref 5–15)
BUN: 49 mg/dL — ABNORMAL HIGH (ref 6–20)
CO2: 26 mmol/L (ref 22–32)
Calcium: 8.6 mg/dL — ABNORMAL LOW (ref 8.9–10.3)
Chloride: 101 mmol/L (ref 98–111)
Creatinine, Ser: 1.53 mg/dL — ABNORMAL HIGH (ref 0.61–1.24)
GFR calc Af Amer: >60 mL/min (ref >60)
GFR calc non Af Amer: 54 mL/min — ABNORMAL LOW (ref >60)
Glucose, Bld: 96 mg/dL (ref 70–99)
Potassium: 3.7 mmol/L (ref 3.5–5.1)
Sodium: 137 mmol/L (ref 135–145)

## 2018-05-12 LAB — GLUCOSE, CAPILLARY
Glucose-Capillary: 96 mg/dL (ref 70–99)
Glucose-Capillary: 89 mg/dL (ref 70–99)
Glucose-Capillary: 136 mg/dL — ABNORMAL HIGH (ref 70–99)
Glucose-Capillary: 126 mg/dL — ABNORMAL HIGH (ref 70–99)

## 2018-05-12 MED ORDER — DEXTROSE 50 % IV SOLN
25.0000 mL | Freq: Once | INTRAVENOUS | Status: AC
  Administered 2018-05-12: 25 mL via INTRAVENOUS
  Filled 2018-05-12: qty 50

## 2018-05-12 NOTE — Progress Notes (Addendum)
Pt blood sugar at 97 after 20 units of lantus. Pt requested to have apple juice and apple sauce for snack. Snack given. Notify prime. Will continue to monitor.  Update 0400: Pt blood sugar was at 89. Pt requested an apple juice to drink. Apple juice was given. Notify prime. Will continue to monitor.  Update 0407: Dr. Allena Katz ordered an 1/2 ampule of D50 once. Will continue to monitor.

## 2018-05-12 NOTE — Progress Notes (Addendum)
[facility] called and talked to [place] and states bed is available. Report was given to [facility] and made aware that pt took plavix 05/11/18 at 1222. [facility] was called and notify that [facility] have an available bed for the pt. [facility] reported that [facility] will transport the pt to [facility], but in case [facility] will have problem with transport, [facility] will then do the transport. Pt made aware. Notify incoming nurse. Will continue to monitor.

## 2018-05-12 NOTE — Plan of Care (Signed)
  Problem: Education: Goal: Ability to verbalize understanding of medication therapies will improve Outcome: Progressing   Problem: Activity: Goal: Capacity to carry out activities will improve Outcome: Progressing   

## 2018-05-13 MED ORDER — INSULIN GLARGINE 100 UNIT/ML ~~LOC~~ SOLN
20.00 | SUBCUTANEOUS | Status: DC
Start: 2018-05-13 — End: 2018-05-13

## 2018-05-13 MED ORDER — ALUM & MAG HYDROXIDE-SIMETH 400-400-40 MG/5ML PO SUSP
30.00 | ORAL | Status: DC
Start: ? — End: 2018-05-13

## 2018-05-13 MED ORDER — METHADONE HCL 10 MG PO TABS
130.00 | ORAL_TABLET | ORAL | Status: DC
Start: 2018-05-14 — End: 2018-05-13

## 2018-05-13 MED ORDER — ACETAMINOPHEN 325 MG PO TABS
650.00 | ORAL_TABLET | ORAL | Status: DC
Start: ? — End: 2018-05-13

## 2018-05-13 MED ORDER — METOCLOPRAMIDE HCL 10 MG PO TABS
10.00 | ORAL_TABLET | ORAL | Status: DC
Start: ? — End: 2018-05-13

## 2018-05-13 MED ORDER — NICOTINE 7 MG/24HR TD PT24
1.00 | MEDICATED_PATCH | TRANSDERMAL | Status: DC
Start: 2018-05-14 — End: 2018-05-13

## 2018-05-13 MED ORDER — METOPROLOL TARTRATE 25 MG PO TABS
12.50 | ORAL_TABLET | ORAL | Status: DC
Start: 2018-05-13 — End: 2018-05-13

## 2018-05-13 MED ORDER — INSULIN REGULAR HUMAN 100 UNIT/ML IJ SOLN
0.00 | INTRAMUSCULAR | Status: DC
Start: 2018-05-13 — End: 2018-05-13

## 2018-05-13 MED ORDER — DEXTROSE 10 % IV SOLN
12.50 | INTRAVENOUS | Status: DC
Start: ? — End: 2018-05-13

## 2018-05-13 MED ORDER — HEPARIN SODIUM (PORCINE) 5000 UNIT/ML IJ SOLN
5000.00 | INTRAMUSCULAR | Status: DC
Start: 2018-05-13 — End: 2018-05-13

## 2018-05-13 MED ORDER — ATORVASTATIN CALCIUM 80 MG PO TABS
80.00 | ORAL_TABLET | ORAL | Status: DC
Start: 2018-05-13 — End: 2018-05-13

## 2018-05-13 MED ORDER — ASPIRIN 81 MG PO CHEW
81.00 | CHEWABLE_TABLET | ORAL | Status: DC
Start: 2018-05-14 — End: 2018-05-13

## 2018-05-13 MED ORDER — INFLUENZA VAC SPLIT QUAD 0.5 ML IM SUSY
.50 | PREFILLED_SYRINGE | INTRAMUSCULAR | Status: DC
Start: ? — End: 2018-05-13

## 2018-05-13 MED ORDER — HYDRALAZINE HCL 20 MG/ML IJ SOLN
10.00 | INTRAMUSCULAR | Status: DC
Start: ? — End: 2018-05-13

## 2018-05-16 ENCOUNTER — Inpatient Hospital Stay: Payer: 59 | Admitting: Family Medicine

## 2018-05-19 MED ORDER — KETAMINE HCL 10 MG/ML IJ SOLN
0.20 | INTRAMUSCULAR | Status: DC
Start: ? — End: 2018-05-19

## 2018-05-19 MED ORDER — ASPIRIN 81 MG PO CHEW
81.00 | CHEWABLE_TABLET | ORAL | Status: DC
Start: 2018-05-27 — End: 2018-05-19

## 2018-05-19 MED ORDER — MUPIROCIN 2 % EX OINT
1.00 | TOPICAL_OINTMENT | CUTANEOUS | Status: DC
Start: 2018-05-20 — End: 2018-05-19

## 2018-05-19 MED ORDER — DEXTROSE 10 % IV SOLN
12.50 | INTRAVENOUS | Status: DC
Start: ? — End: 2018-05-19

## 2018-05-19 MED ORDER — LIDOCAINE 5 % EX PTCH
2.00 | MEDICATED_PATCH | CUTANEOUS | Status: DC
Start: 2018-05-27 — End: 2018-05-19

## 2018-05-19 MED ORDER — LACTATED RINGERS IV SOLN
100.00 | INTRAVENOUS | Status: DC
Start: ? — End: 2018-05-19

## 2018-05-19 MED ORDER — HYDROMORPHONE HCL 1 MG/ML IJ SOLN
0.50 | INTRAMUSCULAR | Status: DC
Start: ? — End: 2018-05-19

## 2018-05-19 MED ORDER — CHOLECALCIFEROL 25 MCG (1000 UT) PO TABS
5000.00 | ORAL_TABLET | ORAL | Status: DC
Start: 2018-05-27 — End: 2018-05-19

## 2018-05-19 MED ORDER — ACETAMINOPHEN 500 MG PO TABS
1000.00 | ORAL_TABLET | ORAL | Status: DC
Start: 2018-05-20 — End: 2018-05-19

## 2018-05-19 MED ORDER — SODIUM CHLORIDE 0.9 % IV SOLN
20.00 | INTRAVENOUS | Status: DC
Start: ? — End: 2018-05-19

## 2018-05-19 MED ORDER — NALOXONE HCL 0.4 MG/ML IJ SOLN
0.40 | INTRAMUSCULAR | Status: DC
Start: ? — End: 2018-05-19

## 2018-05-19 MED ORDER — HEPARIN SODIUM (PORCINE) 5000 UNIT/ML IJ SOLN
5000.00 | INTRAMUSCULAR | Status: DC
Start: 2018-05-26 — End: 2018-05-19

## 2018-05-19 MED ORDER — GENERIC EXTERNAL MEDICATION
1.00 | Status: DC
Start: ? — End: 2018-05-19

## 2018-05-19 MED ORDER — OXYCODONE HCL 5 MG PO TABS
10.00 | ORAL_TABLET | ORAL | Status: DC
Start: ? — End: 2018-05-19

## 2018-05-19 MED ORDER — OXYCODONE HCL 5 MG PO TABS
5.00 | ORAL_TABLET | ORAL | Status: DC
Start: ? — End: 2018-05-19

## 2018-05-19 MED ORDER — ATORVASTATIN CALCIUM 80 MG PO TABS
80.00 | ORAL_TABLET | ORAL | Status: DC
Start: 2018-05-26 — End: 2018-05-19

## 2018-05-19 MED ORDER — DOCUSATE SODIUM 100 MG PO CAPS
100.00 | ORAL_CAPSULE | ORAL | Status: DC
Start: 2018-05-26 — End: 2018-05-19

## 2018-05-19 MED ORDER — POLYETHYLENE GLYCOL 3350 17 G PO PACK
17.00 | PACK | ORAL | Status: DC
Start: 2018-05-20 — End: 2018-05-19

## 2018-05-19 MED ORDER — INSULIN REGULAR HUMAN 100 UNIT/ML IJ SOLN
0.00 | INTRAMUSCULAR | Status: DC
Start: 2018-05-19 — End: 2018-05-19

## 2018-05-19 MED ORDER — ONDANSETRON HCL 4 MG/2ML IJ SOLN
4.00 | INTRAMUSCULAR | Status: DC
Start: ? — End: 2018-05-19

## 2018-05-19 MED ORDER — GENERIC EXTERNAL MEDICATION
Status: DC
Start: ? — End: 2018-05-19

## 2018-05-26 MED ORDER — INSULIN GLARGINE 100 UNIT/ML ~~LOC~~ SOLN
20.00 | SUBCUTANEOUS | Status: DC
Start: 2018-05-27 — End: 2018-05-26

## 2018-05-26 MED ORDER — HYDRALAZINE HCL 20 MG/ML IJ SOLN
10.00 | INTRAMUSCULAR | Status: DC
Start: ? — End: 2018-05-26

## 2018-05-26 MED ORDER — METOPROLOL SUCCINATE ER 25 MG PO TB24
25.00 | ORAL_TABLET | ORAL | Status: DC
Start: 2018-05-27 — End: 2018-05-26

## 2018-05-26 MED ORDER — FUROSEMIDE 40 MG PO TABS
40.00 | ORAL_TABLET | ORAL | Status: DC
Start: 2018-05-26 — End: 2018-05-26

## 2018-05-26 MED ORDER — METFORMIN HCL 500 MG PO TABS
1000.00 | ORAL_TABLET | ORAL | Status: DC
Start: 2018-05-26 — End: 2018-05-26

## 2018-05-26 MED ORDER — HYDROMORPHONE HCL 1 MG/ML IJ SOLN
0.50 | INTRAMUSCULAR | Status: DC
Start: ? — End: 2018-05-26

## 2018-05-26 MED ORDER — INSULIN LISPRO 100 UNIT/ML ~~LOC~~ SOLN
2.00 | SUBCUTANEOUS | Status: DC
Start: 2018-05-26 — End: 2018-05-26

## 2018-05-26 MED ORDER — INSULIN LISPRO 100 UNIT/ML ~~LOC~~ SOLN
1.00 | SUBCUTANEOUS | Status: DC
Start: 2018-05-26 — End: 2018-05-26

## 2018-05-26 MED ORDER — OXYCODONE HCL 5 MG PO TABS
10.00 | ORAL_TABLET | ORAL | Status: DC
Start: ? — End: 2018-05-26

## 2018-05-26 MED ORDER — METHADONE HCL 10 MG PO TABS
130.00 | ORAL_TABLET | ORAL | Status: DC
Start: 2018-05-27 — End: 2018-05-26

## 2018-05-26 MED ORDER — CLOPIDOGREL BISULFATE 75 MG PO TABS
75.00 | ORAL_TABLET | ORAL | Status: DC
Start: 2018-05-27 — End: 2018-05-26

## 2018-05-26 MED ORDER — ACETAMINOPHEN 500 MG PO TABS
1000.00 | ORAL_TABLET | ORAL | Status: DC
Start: ? — End: 2018-05-26

## 2018-05-26 MED ORDER — POLYETHYLENE GLYCOL 3350 17 G PO PACK
17.00 | PACK | ORAL | Status: DC
Start: 2018-05-27 — End: 2018-05-26

## 2018-05-26 MED ORDER — MAGNESIUM OXIDE 400 MG PO TABS
400.00 | ORAL_TABLET | ORAL | Status: DC
Start: 2018-05-26 — End: 2018-05-26

## 2018-05-26 MED ORDER — NICOTINE POLACRILEX 4 MG MT GUM
4.00 | CHEWING_GUM | OROMUCOSAL | Status: DC
Start: ? — End: 2018-05-26

## 2018-05-26 MED ORDER — OXYCODONE HCL 5 MG PO TABS
5.00 | ORAL_TABLET | ORAL | Status: DC
Start: ? — End: 2018-05-26

## 2018-05-26 MED ORDER — LOSARTAN POTASSIUM 25 MG PO TABS
12.50 | ORAL_TABLET | ORAL | Status: DC
Start: 2018-05-27 — End: 2018-05-26

## 2018-07-01 ENCOUNTER — Encounter: Payer: Self-pay | Admitting: Family Medicine

## 2018-07-01 ENCOUNTER — Ambulatory Visit: Payer: 59 | Admitting: Family Medicine

## 2018-07-01 ENCOUNTER — Other Ambulatory Visit: Payer: Self-pay

## 2018-07-01 VITALS — BP 132/86 | HR 85 | Temp 98.4°F | Ht 72.0 in | Wt 149.0 lb

## 2018-07-01 DIAGNOSIS — R351 Nocturia: Secondary | ICD-10-CM

## 2018-07-01 DIAGNOSIS — E782 Mixed hyperlipidemia: Secondary | ICD-10-CM

## 2018-07-01 DIAGNOSIS — I5021 Acute systolic (congestive) heart failure: Secondary | ICD-10-CM

## 2018-07-01 DIAGNOSIS — I129 Hypertensive chronic kidney disease with stage 1 through stage 4 chronic kidney disease, or unspecified chronic kidney disease: Secondary | ICD-10-CM

## 2018-07-01 DIAGNOSIS — B182 Chronic viral hepatitis C: Secondary | ICD-10-CM | POA: Diagnosis not present

## 2018-07-01 DIAGNOSIS — E1365 Other specified diabetes mellitus with hyperglycemia: Secondary | ICD-10-CM | POA: Diagnosis not present

## 2018-07-01 DIAGNOSIS — N401 Enlarged prostate with lower urinary tract symptoms: Secondary | ICD-10-CM

## 2018-07-01 DIAGNOSIS — Z794 Long term (current) use of insulin: Secondary | ICD-10-CM

## 2018-07-01 DIAGNOSIS — F1911 Other psychoactive substance abuse, in remission: Secondary | ICD-10-CM

## 2018-07-01 LAB — UA/M W/RFLX CULTURE, ROUTINE
Bilirubin, UA: NEGATIVE
Ketones, UA: NEGATIVE
Leukocytes, UA: NEGATIVE
NITRITE UA: NEGATIVE
Specific Gravity, UA: 1.02 (ref 1.005–1.030)
Urobilinogen, Ur: 0.2 mg/dL (ref 0.2–1.0)
pH, UA: 7.5 (ref 5.0–7.5)

## 2018-07-01 LAB — MICROSCOPIC EXAMINATION: WBC, UA: NONE SEEN /hpf (ref 0–5)

## 2018-07-01 LAB — MICROALBUMIN, URINE WAIVED
CREATININE, URINE WAIVED: 50 mg/dL (ref 10–300)
Microalb, Ur Waived: 150 mg/L — ABNORMAL HIGH (ref 0–19)
Microalb/Creat Ratio: >300 mg/g — ABNORMAL HIGH (ref <30)

## 2018-07-01 LAB — BAYER DCA HB A1C WAIVED: HB A1C (BAYER DCA - WAIVED): 12.4 % — ABNORMAL HIGH (ref <7.0)

## 2018-07-01 MED ORDER — INSULIN DETEMIR 100 UNIT/ML ~~LOC~~ SOLN: 20.0000 [IU] | Freq: Every day | SUBCUTANEOUS | 1 refills | Status: DC

## 2018-07-01 MED ORDER — METFORMIN HCL ER 500 MG PO TB24: 1000.0000 mg | ORAL_TABLET | Freq: Two times a day (BID) | ORAL | 3 refills | Status: DC

## 2018-07-01 MED ORDER — CLOPIDOGREL BISULFATE 75 MG PO TABS: 75.0000 mg | ORAL_TABLET | Freq: Every day | ORAL | 1 refills | Status: DC

## 2018-07-01 MED ORDER — ATORVASTATIN CALCIUM 40 MG PO TABS: 40.0000 mg | ORAL_TABLET | Freq: Every day | ORAL | 1 refills | Status: DC

## 2018-07-01 NOTE — Progress Notes (Signed)
BP 132/86   Pulse 85   Temp 98.4 F (36.9 C) (Oral)   Ht 6' (1.829 m)   Wt 149 lb (67.6 kg)   SpO2 99%   BMI 20.21 kg/m    Subjective:    Patient ID: Jesse Christian, male    DOB: 18-Jul-1970, 48 y.o.   MRN: [id number]  HPI: Jesse Christian is a 48 y.o. male  Chief Complaint  Patient presents with  . Hospitalization Follow-up    heart sugery. pt states that he has been having issues with his metformin, vomiting, nauseous and gassy   Has not seen cardiology since he got out of the hospital. They have been in contact with him. He felt like he was doing really well since coming out of the hospital.   DIABETES- having a lot of gassiness since starting the metformin. Not feeling great on it Hypoglycemic episodes:no Polydipsia/polyuria: no Visual disturbance: no Chest pain: no Paresthesias: no Glucose Monitoring: yes  Accucheck frequency: Daily  Fasting glucose: 240 Taking Insulin?: yes Blood Pressure Monitoring: not checking Retinal Examination: Not up to Date Foot Exam: Not up to Date Diabetic Education: Completed Pneumovax: Up to Date Influenza: Up to Date Aspirin: yes  HYPERTENSION / HYPERLIPIDEMIA Satisfied with current treatment? yes Duration of hypertension: chronic BP monitoring frequency: not checking BP medication side effects: no Duration of hyperlipidemia: chronic Cholesterol medication side effects: no Cholesterol supplements: none Past cholesterol medications: atorvastatin Medication compliance: excellent compliance Aspirin: yes Recent stressors: no Recurrent headaches: no Visual changes: no Palpitations: no Dyspnea: no Chest pain: no Lower extremity edema: no Dizzy/lightheaded: no  Relevant past medical, surgical, family and social history reviewed and updated as indicated. Interim medical history since our last visit reviewed. Allergies and medications reviewed and updated.  Review of Systems  Constitutional: Negative.   Cardiovascular:  Negative.   Gastrointestinal: Positive for diarrhea, nausea and vomiting. Negative for abdominal distention, abdominal pain, anal bleeding, blood in stool, constipation and rectal pain.  Skin: Negative.   Neurological: Negative.   Psychiatric/Behavioral: Negative.     Per HPI unless specifically indicated above     Objective:    BP 132/86   Pulse 85   Temp 98.4 F (36.9 C) (Oral)   Ht 6' (1.829 m)   Wt 149 lb (67.6 kg)   SpO2 99%   BMI 20.21 kg/m   Wt Readings from Last 3 Encounters:  07/01/18 149 lb (67.6 kg)  05/12/18 152 lb 9.6 oz (69.2 kg)  09/23/17 155 lb (70.3 kg)    Physical Exam Vitals signs and nursing note reviewed.  Constitutional:      General: He is not in acute distress.    Appearance: Normal appearance. He is not ill-appearing, toxic-appearing or diaphoretic.  HENT:     Head: Normocephalic and atraumatic.     Right Ear: External ear normal.     Left Ear: External ear normal.     Nose: Nose normal.     Mouth/Throat:     Mouth: Mucous membranes are moist.     Pharynx: Oropharynx is clear.  Eyes:     General: No scleral icterus.       Right eye: No discharge.        Left eye: No discharge.     Extraocular Movements: Extraocular movements intact.     Conjunctiva/sclera: Conjunctivae normal.     Pupils: Pupils are equal, round, and reactive to light.  Neck:     Musculoskeletal: Normal range of motion and  neck supple.  Cardiovascular:     Rate and Rhythm: Normal rate and regular rhythm.     Pulses: Normal pulses.     Heart sounds: Normal heart sounds. No murmur. No friction rub. No gallop.   Pulmonary:     Effort: Pulmonary effort is normal. No respiratory distress.     Breath sounds: Normal breath sounds. No stridor. No wheezing, rhonchi or rales.  Chest:     Chest wall: No tenderness.  Musculoskeletal: Normal range of motion.  Skin:    General: Skin is warm and dry.     Capillary Refill: Capillary refill takes less than 2 seconds.      Coloration: Skin is not jaundiced or pale.     Findings: No bruising, erythema, lesion or rash.  Neurological:     General: No focal deficit present.     Mental Status: He is alert and oriented to person, place, and time. Mental status is at baseline.  Psychiatric:        Mood and Affect: Mood normal.        Behavior: Behavior normal.        Thought Content: Thought content normal.        Judgment: Judgment normal.     Results for orders placed or performed in visit on 07/01/18  Microscopic Examination  Result Value Ref Range   WBC, UA None seen 0 - 5 /hpf   RBC, UA 3-10 (A) 0 - 2 /hpf   Epithelial Cells (non renal) 0-10 0 - 10 /hpf   Bacteria, UA Few (A) None seen/Few  Bayer DCA Hb A1c Waived  Result Value Ref Range   HB A1C (BAYER DCA - WAIVED) 12.4 (H) <7.0 %  CBC with Differential/Platelet  Result Value Ref Range   WBC 6.8 3.4 - 10.8 x10E3/uL   RBC 4.00 (L) 4.14 - 5.80 x10E6/uL   Hemoglobin 10.6 (L) 13.0 - 17.7 g/dL   Hematocrit 16.1 (L) 09.6 - 51.0 %   MCV 85 79 - 97 fL   MCH 26.5 (L) 26.6 - 33.0 pg   MCHC 31.4 (L) 31.5 - 35.7 g/dL   RDW 04.5 40.9 - 81.1 %   Platelets 357 150 - 450 x10E3/uL   Neutrophils 50 Not Estab. %   Lymphs 36 Not Estab. %   Monocytes 6 Not Estab. %   Eos 7 Not Estab. %   Basos 1 Not Estab. %   Neutrophils Absolute 3.4 1.4 - 7.0 x10E3/uL   Lymphocytes Absolute 2.5 0.7 - 3.1 x10E3/uL   Monocytes Absolute 0.4 0.1 - 0.9 x10E3/uL   EOS (ABSOLUTE) 0.5 (H) 0.0 - 0.4 x10E3/uL   Basophils Absolute 0.1 0.0 - 0.2 x10E3/uL   Immature Granulocytes 0 Not Estab. %   Immature Grans (Abs) 0.0 0.0 - 0.1 x10E3/uL  Comprehensive metabolic panel  Result Value Ref Range   Glucose 443 (H) 65 - 99 mg/dL   BUN 13 6 - 24 mg/dL   Creatinine, Ser 9.14 (H) 0.76 - 1.27 mg/dL   GFR calc non Af Amer 67 >59 mL/min/1.73   GFR calc Af Amer 78 >59 mL/min/1.73   BUN/Creatinine Ratio 10 9 - 20   Sodium 131 (L) 134 - 144 mmol/L   Potassium 5.8 (H) 3.5 - 5.2 mmol/L    Chloride 91 (L) 96 - 106 mmol/L   CO2 26 20 - 29 mmol/L   Calcium 9.5 8.7 - 10.2 mg/dL   Total Protein 7.0 6.0 - 8.5 g/dL   Albumin 4.0  4.0 - 5.0 g/dL   Globulin, Total 3.0 1.5 - 4.5 g/dL   Albumin/Globulin Ratio 1.3 1.2 - 2.2   Bilirubin Total 0.6 0.0 - 1.2 mg/dL   Alkaline Phosphatase 191 (H) 39 - 117 IU/L   AST 19 0 - 40 IU/L   ALT 25 0 - 44 IU/L  Lipid Panel w/o Chol/HDL Ratio  Result Value Ref Range   Cholesterol, Total 146 100 - 199 mg/dL   Triglycerides 096218 (H) 0 - 149 mg/dL   HDL 51 >04>39 mg/dL   VLDL Cholesterol Cal 44 (H) 5 - 40 mg/dL   LDL Calculated 51 0 - 99 mg/dL  Microalbumin, Urine Waived  Result Value Ref Range   Microalb, Ur Waived 150 (H) 0 - 19 mg/L   Creatinine, Urine Waived 50 10 - 300 mg/dL   Microalb/Creat Ratio >300 (H) <30 mg/g  PSA  Result Value Ref Range   Prostate Specific Ag, Serum <0.1 0.0 - 4.0 ng/mL  TSH  Result Value Ref Range   TSH 0.663 0.450 - 4.500 uIU/mL  UA/M w/rflx Culture, Routine  Result Value Ref Range   Specific Gravity, UA 1.020 1.005 - 1.030   pH, UA 7.5 5.0 - 7.5   Color, UA Yellow Yellow   Appearance Ur Clear Clear   Leukocytes, UA Negative Negative   Protein, UA 3+ (A) Negative/Trace   Glucose, UA 2+ (A) Negative   Ketones, UA Negative Negative   RBC, UA Trace (A) Negative   Bilirubin, UA Negative Negative   Urobilinogen, Ur 0.2 0.2 - 1.0 mg/dL   Nitrite, UA Negative Negative   Microscopic Examination See below:       Assessment & Plan:   Problem List Items Addressed This Visit      Cardiovascular and Mediastinum   Acute systolic heart failure (HCC)    Euvolemic today. Needs to follow up with cardiology- but not able to right now due to COVID-19 precautions. Call with any concerns.       Relevant Medications   atorvastatin (LIPITOR) 40 MG tablet   Other Relevant Orders   CBC with Differential/Platelet (Completed)   Comprehensive metabolic panel (Completed)   TSH (Completed)   UA/M w/rflx Culture, Routine  (Completed)     Digestive   Hep C w/o coma, chronic (HCC)    Has been treated. Will check viral load today. Call with any concerns. Await results.       Relevant Orders   CBC with Differential/Platelet (Completed)   Comprehensive metabolic panel (Completed)   TSH (Completed)   UA/M w/rflx Culture, Routine (Completed)   HCV RNA quant     Endocrine   Diabetes mellitus with hyperglycemia, with long-term current use of insulin (HCC) - Primary    Stable with A1c of 12.4- not doing well. Will see if he can tolerate his metformin. Recheck 1 week. Call with any concerns.       Relevant Medications   insulin detemir (LEVEMIR) 100 UNIT/ML injection   metFORMIN (GLUCOPHAGE XR) 500 MG 24 hr tablet   atorvastatin (LIPITOR) 40 MG tablet   Other Relevant Orders   Bayer DCA Hb A1c Waived (Completed)   CBC with Differential/Platelet (Completed)   Comprehensive metabolic panel (Completed)   Microalbumin, Urine Waived (Completed)   TSH (Completed)   UA/M w/rflx Culture, Routine (Completed)     Genitourinary   Benign hypertensive renal disease    Under good control off medication. Continue to monitor. Call with any concerns.  Relevant Orders   CBC with Differential/Platelet (Completed)   Comprehensive metabolic panel (Completed)   Microalbumin, Urine Waived (Completed)   TSH (Completed)   UA/M w/rflx Culture, Routine (Completed)     Other   Hyperlipidemia    Rechecking levels today. Await results. Call with any concerns.       Relevant Medications   atorvastatin (LIPITOR) 40 MG tablet   Other Relevant Orders   CBC with Differential/Platelet (Completed)   Comprehensive metabolic panel (Completed)   Lipid Panel w/o Chol/HDL Ratio (Completed)   TSH (Completed)   UA/M w/rflx Culture, Routine (Completed)   History of drug abuse in remission (HCC)    Continues to follow with methadone clinic. Call with any concerns.       Benign prostatic hyperplasia with nocturia    Stable.  Rechecking levels today. Await results. Call with any concerns.       Relevant Orders   CBC with Differential/Platelet (Completed)   Comprehensive metabolic panel (Completed)   PSA (Completed)   TSH (Completed)   UA/M w/rflx Culture, Routine (Completed)       Follow up plan: Return in about 1 week (around 07/08/2018).

## 2018-07-02 LAB — COMPREHENSIVE METABOLIC PANEL
ALT: 25 IU/L (ref 0–44)
AST: 19 IU/L (ref 0–40)
Albumin/Globulin Ratio: 1.3 (ref 1.2–2.2)
Albumin: 4 g/dL (ref 4.0–5.0)
Alkaline Phosphatase: 191 IU/L — ABNORMAL HIGH (ref 39–117)
BUN/Creatinine Ratio: 10 (ref 9–20)
BUN: 13 mg/dL (ref 6–24)
Bilirubin Total: 0.6 mg/dL (ref 0.0–1.2)
CO2: 26 mmol/L (ref 20–29)
Calcium: 9.5 mg/dL (ref 8.7–10.2)
Chloride: 91 mmol/L — ABNORMAL LOW (ref 96–106)
Creatinine, Ser: 1.28 mg/dL — ABNORMAL HIGH (ref 0.76–1.27)
GFR calc Af Amer: 78 mL/min/{1.73_m2} (ref >59)
GFR calc non Af Amer: 67 mL/min/{1.73_m2} (ref >59)
Globulin, Total: 3 g/dL (ref 1.5–4.5)
Glucose: 443 mg/dL — ABNORMAL HIGH (ref 65–99)
Potassium: 5.8 mmol/L — ABNORMAL HIGH (ref 3.5–5.2)
Sodium: 131 mmol/L — ABNORMAL LOW (ref 134–144)
TOTAL PROTEIN: 7 g/dL (ref 6.0–8.5)

## 2018-07-02 LAB — CBC WITH DIFFERENTIAL/PLATELET
Basophils Absolute: 0.1 10*3/uL (ref 0.0–0.2)
Basos: 1 %
EOS (ABSOLUTE): 0.5 10*3/uL — ABNORMAL HIGH (ref 0.0–0.4)
EOS: 7 %
Hematocrit: 33.8 % — ABNORMAL LOW (ref 37.5–51.0)
Hemoglobin: 10.6 g/dL — ABNORMAL LOW (ref 13.0–17.7)
IMMATURE GRANS (ABS): 0 10*3/uL (ref 0.0–0.1)
Immature Granulocytes: 0 %
Lymphocytes Absolute: 2.5 10*3/uL (ref 0.7–3.1)
Lymphs: 36 %
MCH: 26.5 pg — ABNORMAL LOW (ref 26.6–33.0)
MCHC: 31.4 g/dL — ABNORMAL LOW (ref 31.5–35.7)
MCV: 85 fL (ref 79–97)
Monocytes Absolute: 0.4 10*3/uL (ref 0.1–0.9)
Monocytes: 6 %
Neutrophils Absolute: 3.4 10*3/uL (ref 1.4–7.0)
Neutrophils: 50 %
Platelets: 357 10*3/uL (ref 150–450)
RBC: 4 x10E6/uL — ABNORMAL LOW (ref 4.14–5.80)
RDW: 13.1 % (ref 11.6–15.4)
WBC: 6.8 10*3/uL (ref 3.4–10.8)

## 2018-07-02 LAB — LIPID PANEL W/O CHOL/HDL RATIO
CHOLESTEROL TOTAL: 146 mg/dL (ref 100–199)
HDL: 51 mg/dL (ref >39)
LDL Calculated: 51 mg/dL (ref 0–99)
Triglycerides: 218 mg/dL — ABNORMAL HIGH (ref 0–149)
VLDL Cholesterol Cal: 44 mg/dL — ABNORMAL HIGH (ref 5–40)

## 2018-07-02 LAB — TSH: TSH: 0.663 u[IU]/mL (ref 0.450–4.500)

## 2018-07-02 LAB — PSA: Prostate Specific Ag, Serum: <0.1 ng/mL (ref 0.0–4.0)

## 2018-07-07 ENCOUNTER — Encounter: Payer: Self-pay | Admitting: Family Medicine

## 2018-07-07 NOTE — Assessment & Plan Note (Signed)
Under good control off medication. Continue to monitor. Call with any concerns.  

## 2018-07-07 NOTE — Assessment & Plan Note (Signed)
Continues to follow with methadone clinic. Call with any concerns.

## 2018-07-07 NOTE — Assessment & Plan Note (Signed)
Stable. Rechecking levels today. Await results. Call with any concerns.  

## 2018-07-07 NOTE — Assessment & Plan Note (Signed)
Stable with A1c of 12.4- not doing well. Will see if he can tolerate his metformin. Recheck 1 week. Call with any concerns.

## 2018-07-07 NOTE — Assessment & Plan Note (Signed)
Euvolemic today. Needs to follow up with cardiology- but not able to right now due to COVID-19 precautions. Call with any concerns.

## 2018-07-07 NOTE — Assessment & Plan Note (Signed)
Has been treated. Will check viral load today. Call with any concerns. Await results.

## 2018-07-07 NOTE — Assessment & Plan Note (Signed)
Rechecking levels today. Await results. Call with any concerns.  

## 2018-07-08 ENCOUNTER — Encounter: Payer: Self-pay | Admitting: Family Medicine

## 2018-07-08 ENCOUNTER — Ambulatory Visit (INDEPENDENT_AMBULATORY_CARE_PROVIDER_SITE_OTHER): Payer: 59 | Admitting: Family Medicine

## 2018-07-08 ENCOUNTER — Other Ambulatory Visit: Payer: Self-pay

## 2018-07-08 DIAGNOSIS — E1365 Other specified diabetes mellitus with hyperglycemia: Secondary | ICD-10-CM | POA: Diagnosis not present

## 2018-07-08 DIAGNOSIS — Z794 Long term (current) use of insulin: Secondary | ICD-10-CM | POA: Diagnosis not present

## 2018-07-08 NOTE — Assessment & Plan Note (Signed)
Doing better with the XR metformin. Will continue current regimen and get him back to work. Recheck tolerance 1 month. Call with any concerns.

## 2018-07-08 NOTE — Progress Notes (Signed)
There were no vitals taken for this visit.   Subjective:    Patient ID: Jesse Christian, male    DOB: 20-May-1970, 48 y.o.   MRN: [id number]  HPI: Jesse Christian is a 48 y.o. male  Chief Complaint  Patient presents with  . Diabetes  . TELEMEDICINE VISIT   Beth presents today via virtual visit due to COVID-19 precautions for follow up on his diabetes and abdominal discomfort on the short acting metformin. Last week he was changed to long acting metformin. He is not having any more nausea and vomiting. No diarrhea. Sugars have been getting better- still running high, but improving. Tolerating the long acting metformin better. No other concerns or complaints. He would like to return to work tomorrow.   Relevant past medical, surgical, family and social history reviewed and updated as indicated. Interim medical history since our last visit reviewed. Allergies and medications reviewed and updated.  Review of Systems  Constitutional: Negative.   Respiratory: Negative.   Cardiovascular: Negative.   Gastrointestinal: Negative.   Neurological: Negative.   Psychiatric/Behavioral: Negative.     Per HPI unless specifically indicated above     Objective:    There were no vitals taken for this visit.  Wt Readings from Last 3 Encounters:  07/01/18 149 lb (67.6 kg)  05/12/18 152 lb 9.6 oz (69.2 kg)  09/23/17 155 lb (70.3 kg)    Physical Exam Vitals signs and nursing note reviewed.  Pulmonary:     Effort: Pulmonary effort is normal. No respiratory distress.     Comments: Speaking in full sentences Neurological:     Mental Status: He is alert.  Psychiatric:        Mood and Affect: Mood normal.        Behavior: Behavior normal.        Thought Content: Thought content normal.        Judgment: Judgment normal.     Results for orders placed or performed in visit on 07/01/18  Microscopic Examination  Result Value Ref Range   WBC, UA None seen 0 - 5 /hpf   RBC, UA 3-10 (A) 0 - 2  /hpf   Epithelial Cells (non renal) 0-10 0 - 10 /hpf   Bacteria, UA Few (A) None seen/Few  Bayer DCA Hb A1c Waived  Result Value Ref Range   HB A1C (BAYER DCA - WAIVED) 12.4 (H) <7.0 %  CBC with Differential/Platelet  Result Value Ref Range   WBC 6.8 3.4 - 10.8 x10E3/uL   RBC 4.00 (L) 4.14 - 5.80 x10E6/uL   Hemoglobin 10.6 (L) 13.0 - 17.7 g/dL   Hematocrit 12.8 (L) 78.6 - 51.0 %   MCV 85 79 - 97 fL   MCH 26.5 (L) 26.6 - 33.0 pg   MCHC 31.4 (L) 31.5 - 35.7 g/dL   RDW 76.7 20.9 - 47.0 %   Platelets 357 150 - 450 x10E3/uL   Neutrophils 50 Not Estab. %   Lymphs 36 Not Estab. %   Monocytes 6 Not Estab. %   Eos 7 Not Estab. %   Basos 1 Not Estab. %   Neutrophils Absolute 3.4 1.4 - 7.0 x10E3/uL   Lymphocytes Absolute 2.5 0.7 - 3.1 x10E3/uL   Monocytes Absolute 0.4 0.1 - 0.9 x10E3/uL   EOS (ABSOLUTE) 0.5 (H) 0.0 - 0.4 x10E3/uL   Basophils Absolute 0.1 0.0 - 0.2 x10E3/uL   Immature Granulocytes 0 Not Estab. %   Immature Grans (Abs) 0.0 0.0 - 0.1 x10E3/uL  Comprehensive metabolic panel  Result Value Ref Range   Glucose 443 (H) 65 - 99 mg/dL   BUN 13 6 - 24 mg/dL   Creatinine, Ser 4.37 (H) 0.76 - 1.27 mg/dL   GFR calc non Af Amer 67 >59 mL/min/1.73   GFR calc Af Amer 78 >59 mL/min/1.73   BUN/Creatinine Ratio 10 9 - 20   Sodium 131 (L) 134 - 144 mmol/L   Potassium 5.8 (H) 3.5 - 5.2 mmol/L   Chloride 91 (L) 96 - 106 mmol/L   CO2 26 20 - 29 mmol/L   Calcium 9.5 8.7 - 10.2 mg/dL   Total Protein 7.0 6.0 - 8.5 g/dL   Albumin 4.0 4.0 - 5.0 g/dL   Globulin, Total 3.0 1.5 - 4.5 g/dL   Albumin/Globulin Ratio 1.3 1.2 - 2.2   Bilirubin Total 0.6 0.0 - 1.2 mg/dL   Alkaline Phosphatase 191 (H) 39 - 117 IU/L   AST 19 0 - 40 IU/L   ALT 25 0 - 44 IU/L  Lipid Panel w/o Chol/HDL Ratio  Result Value Ref Range   Cholesterol, Total 146 100 - 199 mg/dL   Triglycerides 357 (H) 0 - 149 mg/dL   HDL 51 >89 mg/dL   VLDL Cholesterol Cal 44 (H) 5 - 40 mg/dL   LDL Calculated 51 0 - 99 mg/dL   Microalbumin, Urine Waived  Result Value Ref Range   Microalb, Ur Waived 150 (H) 0 - 19 mg/L   Creatinine, Urine Waived 50 10 - 300 mg/dL   Microalb/Creat Ratio >300 (H) <30 mg/g  PSA  Result Value Ref Range   Prostate Specific Ag, Serum <0.1 0.0 - 4.0 ng/mL  TSH  Result Value Ref Range   TSH 0.663 0.450 - 4.500 uIU/mL  UA/M w/rflx Culture, Routine  Result Value Ref Range   Specific Gravity, UA 1.020 1.005 - 1.030   pH, UA 7.5 5.0 - 7.5   Color, UA Yellow Yellow   Appearance Ur Clear Clear   Leukocytes, UA Negative Negative   Protein, UA 3+ (A) Negative/Trace   Glucose, UA 2+ (A) Negative   Ketones, UA Negative Negative   RBC, UA Trace (A) Negative   Bilirubin, UA Negative Negative   Urobilinogen, Ur 0.2 0.2 - 1.0 mg/dL   Nitrite, UA Negative Negative   Microscopic Examination See below:       Assessment & Plan:   Problem List Items Addressed This Visit      Endocrine   Diabetes mellitus with hyperglycemia, with long-term current use of insulin (HCC) - Primary    Doing better with the XR metformin. Will continue current regimen and get him back to work. Recheck tolerance 1 month. Call with any concerns.           Follow up plan: Return in about 4 weeks (around 08/05/2018) for follow up sugars.   . This visit was completed via telephone due to the restrictions of the COVID-19 pandemic. All issues as above were discussed and addressed but no physical exam was performed. If it was felt that the patient should be evaluated in the office, they were directed there. The patient verbally consented to this visit. Patient was unable to complete an audio/visual visit due to Lack of equipment. Due to the catastrophic nature of the COVID-19 pandemic, this visit was done through audio contact only. . Location of the patient: parking lot . Location of the provider: home . Those involved with this call:  . Provider: Olevia Perches, DO .  CMA: Wilhemena DurieBrittany Russell, CMA . Front  Desk/Registration: Adela Portshristan Williamson  . Time spent on call: 21 minutes on the phone discussing health concerns

## 2018-07-14 ENCOUNTER — Encounter: Payer: Self-pay | Admitting: Emergency Medicine

## 2018-07-14 ENCOUNTER — Observation Stay
Admission: EM | Admit: 2018-07-14 | Discharge: 2018-07-16 | Disposition: A | Discharge status: Home / Self Care | Payer: No Typology Code available for payment source | Attending: Internal Medicine | Admitting: Internal Medicine

## 2018-07-14 ENCOUNTER — Other Ambulatory Visit: Payer: Self-pay

## 2018-07-14 ENCOUNTER — Emergency Department: Payer: No Typology Code available for payment source

## 2018-07-14 DIAGNOSIS — N179 Acute kidney failure, unspecified: Secondary | ICD-10-CM | POA: Diagnosis not present

## 2018-07-14 DIAGNOSIS — Z794 Long term (current) use of insulin: Secondary | ICD-10-CM | POA: Insufficient documentation

## 2018-07-14 DIAGNOSIS — R739 Hyperglycemia, unspecified: Secondary | ICD-10-CM | POA: Diagnosis present

## 2018-07-14 DIAGNOSIS — I2510 Atherosclerotic heart disease of native coronary artery without angina pectoris: Secondary | ICD-10-CM | POA: Diagnosis not present

## 2018-07-14 DIAGNOSIS — I5022 Chronic systolic (congestive) heart failure: Secondary | ICD-10-CM | POA: Insufficient documentation

## 2018-07-14 DIAGNOSIS — Z951 Presence of aortocoronary bypass graft: Secondary | ICD-10-CM | POA: Insufficient documentation

## 2018-07-14 DIAGNOSIS — E860 Dehydration: Secondary | ICD-10-CM | POA: Insufficient documentation

## 2018-07-14 DIAGNOSIS — R112 Nausea with vomiting, unspecified: Secondary | ICD-10-CM | POA: Diagnosis not present

## 2018-07-14 DIAGNOSIS — Z7902 Long term (current) use of antithrombotics/antiplatelets: Secondary | ICD-10-CM | POA: Diagnosis not present

## 2018-07-14 DIAGNOSIS — Z79899 Other long term (current) drug therapy: Secondary | ICD-10-CM | POA: Diagnosis not present

## 2018-07-14 DIAGNOSIS — Z8249 Family history of ischemic heart disease and other diseases of the circulatory system: Secondary | ICD-10-CM | POA: Insufficient documentation

## 2018-07-14 DIAGNOSIS — F1911 Other psychoactive substance abuse, in remission: Secondary | ICD-10-CM | POA: Insufficient documentation

## 2018-07-14 DIAGNOSIS — E785 Hyperlipidemia, unspecified: Secondary | ICD-10-CM | POA: Diagnosis not present

## 2018-07-14 DIAGNOSIS — Z87891 Personal history of nicotine dependence: Secondary | ICD-10-CM | POA: Diagnosis not present

## 2018-07-14 DIAGNOSIS — Z7982 Long term (current) use of aspirin: Secondary | ICD-10-CM | POA: Insufficient documentation

## 2018-07-14 DIAGNOSIS — E1165 Type 2 diabetes mellitus with hyperglycemia: Principal | ICD-10-CM | POA: Insufficient documentation

## 2018-07-14 DIAGNOSIS — K859 Acute pancreatitis without necrosis or infection, unspecified: Secondary | ICD-10-CM | POA: Diagnosis present

## 2018-07-14 DIAGNOSIS — R748 Abnormal levels of other serum enzymes: Secondary | ICD-10-CM | POA: Insufficient documentation

## 2018-07-14 DIAGNOSIS — E1110 Type 2 diabetes mellitus with ketoacidosis without coma: Secondary | ICD-10-CM

## 2018-07-14 DIAGNOSIS — D649 Anemia, unspecified: Secondary | ICD-10-CM | POA: Diagnosis not present

## 2018-07-14 DIAGNOSIS — E871 Hypo-osmolality and hyponatremia: Secondary | ICD-10-CM | POA: Insufficient documentation

## 2018-07-14 DIAGNOSIS — Z888 Allergy status to other drugs, medicaments and biological substances status: Secondary | ICD-10-CM | POA: Insufficient documentation

## 2018-07-14 LAB — URINALYSIS, COMPLETE (UACMP) WITH MICROSCOPIC
Bacteria, UA: NONE SEEN
Bilirubin Urine: NEGATIVE
Glucose, UA: >=500 mg/dL — AB
Ketones, ur: 5 mg/dL — AB
Leukocytes,Ua: NEGATIVE
Nitrite: NEGATIVE
Protein, ur: 100 mg/dL — AB
Specific Gravity, Urine: 1.026 (ref 1.005–1.030)
Squamous Epithelial / HPF: NONE SEEN (ref 0–5)
pH: 6 (ref 5.0–8.0)

## 2018-07-14 LAB — CBC
HCT: 36.7 % — ABNORMAL LOW (ref 39.0–52.0)
Hemoglobin: 11.5 g/dL — ABNORMAL LOW (ref 13.0–17.0)
MCH: 26.2 pg (ref 26.0–34.0)
MCHC: 31.3 g/dL (ref 30.0–36.0)
MCV: 83.6 fL (ref 80.0–100.0)
Platelets: 308 10*3/uL (ref 150–400)
RBC: 4.39 MIL/uL (ref 4.22–5.81)
RDW: 14.3 % (ref 11.5–15.5)
WBC: 7.8 10*3/uL (ref 4.0–10.5)
nRBC: 0 % (ref 0.0–0.2)

## 2018-07-14 LAB — AMYLASE: Amylase: 87 U/L (ref 28–100)

## 2018-07-14 LAB — BASIC METABOLIC PANEL
Anion gap: 9 (ref 5–15)
BUN: 25 mg/dL — ABNORMAL HIGH (ref 6–20)
CO2: 27 mmol/L (ref 22–32)
Calcium: 8.5 mg/dL — ABNORMAL LOW (ref 8.9–10.3)
Chloride: 98 mmol/L (ref 98–111)
Creatinine, Ser: 1.21 mg/dL (ref 0.61–1.24)
GFR calc Af Amer: >60 mL/min (ref >60)
GFR calc non Af Amer: >60 mL/min (ref >60)
Glucose, Bld: 550 mg/dL (ref 70–99)
Potassium: 3.4 mmol/L — ABNORMAL LOW (ref 3.5–5.1)
Sodium: 134 mmol/L — ABNORMAL LOW (ref 135–145)

## 2018-07-14 LAB — GLUCOSE, CAPILLARY
Glucose-Capillary: 215 mg/dL — ABNORMAL HIGH (ref 70–99)
Glucose-Capillary: 344 mg/dL — ABNORMAL HIGH (ref 70–99)
Glucose-Capillary: 407 mg/dL — ABNORMAL HIGH (ref 70–99)
Glucose-Capillary: 490 mg/dL — ABNORMAL HIGH (ref 70–99)
Glucose-Capillary: >600 mg/dL (ref 70–99)
Glucose-Capillary: >600 mg/dL (ref 70–99)
Glucose-Capillary: >600 mg/dL (ref 70–99)

## 2018-07-14 LAB — OSMOLALITY: Osmolality: 305 mOsm/kg — ABNORMAL HIGH (ref 275–295)

## 2018-07-14 LAB — COMPREHENSIVE METABOLIC PANEL
ALT: 32 U/L (ref 0–44)
AST: 19 U/L (ref 15–41)
Albumin: 4.2 g/dL (ref 3.5–5.0)
Alkaline Phosphatase: 196 U/L — ABNORMAL HIGH (ref 38–126)
Anion gap: 16 — ABNORMAL HIGH (ref 5–15)
BUN: 28 mg/dL — ABNORMAL HIGH (ref 6–20)
CO2: 24 mmol/L (ref 22–32)
Calcium: 9.4 mg/dL (ref 8.9–10.3)
Chloride: 86 mmol/L — ABNORMAL LOW (ref 98–111)
Creatinine, Ser: 1.42 mg/dL — ABNORMAL HIGH (ref 0.61–1.24)
GFR calc Af Amer: >60 mL/min (ref >60)
GFR calc non Af Amer: 59 mL/min — ABNORMAL LOW (ref >60)
Glucose, Bld: 1063 mg/dL (ref 70–99)
Potassium: 4.4 mmol/L (ref 3.5–5.1)
Sodium: 126 mmol/L — ABNORMAL LOW (ref 135–145)
Total Bilirubin: 1.3 mg/dL — ABNORMAL HIGH (ref 0.3–1.2)
Total Protein: 8.1 g/dL (ref 6.5–8.1)

## 2018-07-14 LAB — MRSA PCR SCREENING: MRSA by PCR: NEGATIVE

## 2018-07-14 LAB — BETA-HYDROXYBUTYRIC ACID: Beta-Hydroxybutyric Acid: 0.22 mmol/L (ref 0.05–0.27)

## 2018-07-14 LAB — LIPASE, BLOOD: Lipase: 144 U/L — ABNORMAL HIGH (ref 11–51)

## 2018-07-14 MED ORDER — POTASSIUM CHLORIDE CRYS ER 20 MEQ PO TBCR
30.0000 meq | EXTENDED_RELEASE_TABLET | Freq: Once | ORAL | Status: AC
  Administered 2018-07-15: 30 meq via ORAL
  Filled 2018-07-14: qty 2

## 2018-07-14 MED ORDER — ONDANSETRON HCL 4 MG PO TABS
4.0000 mg | ORAL_TABLET | Freq: Four times a day (QID) | ORAL | Status: DC | PRN
  Administered 2018-07-14: 4 mg via ORAL
  Filled 2018-07-14: qty 1

## 2018-07-14 MED ORDER — ONDANSETRON HCL 4 MG/2ML IJ SOLN
INTRAMUSCULAR | Status: AC
  Filled 2018-07-14: qty 2

## 2018-07-14 MED ORDER — SODIUM CHLORIDE 0.9 % IV SOLN: INTRAVENOUS | Status: DC | PRN

## 2018-07-14 MED ORDER — INSULIN REGULAR(HUMAN) IN NACL 100-0.9 UT/100ML-% IV SOLN
INTRAVENOUS | Status: DC
  Administered 2018-07-14: 5.4 [IU]/h via INTRAVENOUS
  Filled 2018-07-14: qty 100

## 2018-07-14 MED ORDER — INSULIN REGULAR(HUMAN) IN NACL 100-0.9 UT/100ML-% IV SOLN: INTRAVENOUS | Status: DC

## 2018-07-14 MED ORDER — ACETAMINOPHEN 650 MG RE SUPP: 650.0000 mg | Freq: Four times a day (QID) | RECTAL | Status: DC | PRN

## 2018-07-14 MED ORDER — ONDANSETRON HCL 4 MG/2ML IJ SOLN
4.0000 mg | Freq: Four times a day (QID) | INTRAMUSCULAR | Status: DC | PRN
  Administered 2018-07-15 – 2018-07-16 (×3): 4 mg via INTRAVENOUS
  Filled 2018-07-14 (×3): qty 2

## 2018-07-14 MED ORDER — INSULIN DETEMIR 100 UNIT/ML ~~LOC~~ SOLN
20.0000 [IU] | Freq: Every day | SUBCUTANEOUS | Status: DC
  Administered 2018-07-15 (×2): 20 [IU] via SUBCUTANEOUS
  Filled 2018-07-14 (×4): qty 0.2

## 2018-07-14 MED ORDER — SODIUM CHLORIDE 0.9 % IV SOLN: INTRAVENOUS | Status: DC

## 2018-07-14 MED ORDER — CLOPIDOGREL BISULFATE 75 MG PO TABS
75.0000 mg | ORAL_TABLET | Freq: Every day | ORAL | Status: DC
  Administered 2018-07-15 – 2018-07-16 (×2): 75 mg via ORAL
  Filled 2018-07-14 (×2): qty 1

## 2018-07-14 MED ORDER — DEXTROSE-NACL 5-0.45 % IV SOLN: INTRAVENOUS | Status: DC

## 2018-07-14 MED ORDER — ONDANSETRON HCL 4 MG/2ML IJ SOLN
4.0000 mg | Freq: Once | INTRAMUSCULAR | Status: AC
  Administered 2018-07-14: 4 mg via INTRAVENOUS

## 2018-07-14 MED ORDER — METHADONE HCL 10 MG PO TABS
130.0000 mg | ORAL_TABLET | Freq: Every day | ORAL | Status: DC
  Administered 2018-07-15 – 2018-07-16 (×2): 130 mg via ORAL
  Filled 2018-07-14 (×3): qty 13

## 2018-07-14 MED ORDER — SODIUM CHLORIDE 0.9% FLUSH
3.0000 mL | Freq: Once | INTRAVENOUS | Status: AC
  Administered 2018-07-14: 3 mL via INTRAVENOUS

## 2018-07-14 MED ORDER — DEXTROSE-NACL 5-0.45 % IV SOLN
INTRAVENOUS | Status: DC
  Administered 2018-07-15: via INTRAVENOUS

## 2018-07-14 MED ORDER — ACETAMINOPHEN 325 MG PO TABS: 650.0000 mg | ORAL_TABLET | Freq: Four times a day (QID) | ORAL | Status: DC | PRN

## 2018-07-14 MED ORDER — SODIUM CHLORIDE 0.9 % IV BOLUS
1000.0000 mL | Freq: Once | INTRAVENOUS | Status: AC
  Administered 2018-07-14: 1000 mL via INTRAVENOUS

## 2018-07-14 MED ORDER — DEXTROSE 50 % IV SOLN: 25.0000 mL | INTRAVENOUS | Status: DC | PRN

## 2018-07-14 MED ORDER — INSULIN REGULAR BOLUS VIA INFUSION
0.0000 [IU] | Freq: Three times a day (TID) | INTRAVENOUS | Status: DC
  Filled 2018-07-14: qty 10

## 2018-07-14 MED ORDER — SENNOSIDES-DOCUSATE SODIUM 8.6-50 MG PO TABS: 1.0000 | ORAL_TABLET | Freq: Every evening | ORAL | Status: DC | PRN

## 2018-07-14 MED ORDER — ASPIRIN 81 MG PO CHEW
81.0000 mg | CHEWABLE_TABLET | Freq: Every day | ORAL | Status: DC
  Administered 2018-07-15 – 2018-07-16 (×2): 81 mg via ORAL
  Filled 2018-07-14 (×2): qty 1

## 2018-07-14 MED ORDER — INSULIN ASPART 100 UNIT/ML ~~LOC~~ SOLN
0.0000 [IU] | Freq: Three times a day (TID) | SUBCUTANEOUS | Status: DC
  Administered 2018-07-15: 17:00:00 8 [IU] via SUBCUTANEOUS
  Administered 2018-07-15: 3 [IU] via SUBCUTANEOUS
  Administered 2018-07-16: 5 [IU] via SUBCUTANEOUS
  Administered 2018-07-16: 13:00:00 15 [IU] via SUBCUTANEOUS
  Filled 2018-07-14 (×4): qty 1

## 2018-07-14 MED ORDER — ATORVASTATIN CALCIUM 20 MG PO TABS
40.0000 mg | ORAL_TABLET | Freq: Every day | ORAL | Status: DC
  Administered 2018-07-15: 40 mg via ORAL
  Filled 2018-07-14: qty 2

## 2018-07-14 MED ORDER — SODIUM CHLORIDE 0.9 % IV BOLUS
1000.0000 mL | Freq: Once | INTRAVENOUS | Status: AC
  Administered 2018-07-14: 18:00:00 1000 mL via INTRAVENOUS

## 2018-07-14 MED ORDER — PROMETHAZINE HCL 25 MG/ML IJ SOLN: 12.5000 mg | Freq: Four times a day (QID) | INTRAMUSCULAR | Status: DC | PRN

## 2018-07-14 MED ORDER — SODIUM CHLORIDE 0.9 % IV SOLN
INTRAVENOUS | Status: DC
  Administered 2018-07-14: 20:00:00 via INTRAVENOUS

## 2018-07-14 MED ORDER — INSULIN ASPART 100 UNIT/ML ~~LOC~~ SOLN
0.0000 [IU] | Freq: Every day | SUBCUTANEOUS | Status: DC
  Administered 2018-07-15: 3 [IU] via SUBCUTANEOUS
  Filled 2018-07-14: qty 1

## 2018-07-14 MED ORDER — ENOXAPARIN SODIUM 40 MG/0.4ML ~~LOC~~ SOLN
40.0000 mg | SUBCUTANEOUS | Status: DC
  Administered 2018-07-14 – 2018-07-15 (×2): 40 mg via SUBCUTANEOUS
  Filled 2018-07-14 (×2): qty 0.4

## 2018-07-14 NOTE — ED Notes (Signed)
ED TO INPATIENT HANDOFF REPORT  ED Nurse Name and Phone #: Jae Dire [phone]  S Name/Age/Gender Jesse Christian 48 y.o. male Room/Bed: ED09A/ED09A  Code Status   Code Status: Full Code  Home/SNF/Other Home Patient oriented to: self, place, time and situation Is this baseline? Yes   Triage Complete: Triage complete  Chief Complaint vomitting  Triage Note Pt to ED via POV c/o vomiting x 4 days. Pt states that he is unable to keep anything down. Pt states that he has vomited "too many times to count" in the last 24 hours. Pt denies fever and diarrhea. Pt is pale on arrival.    Allergies Allergies  Allergen Reactions  . Lyrica [Pregabalin] Other (See Comments)    Peripheral edema    Level of Care/Admitting Diagnosis ED Disposition    ED Disposition Condition Comment   Admit  Hospital Area: [facility] [[id number]]  Level of Care: Stepdown [14]  Diagnosis: Hyperglycemia without ketosis [726046]  Admitting Physician: Ihor Austin [[id number]]  Attending Physician: Ihor Austin [[id number]]  Estimated length of stay: past midnight tomorrow  Certification:: I certify this patient will need inpatient services for at least 2 midnights  PT Class (Do Not Modify): Inpatient [101]  PT Acc Code (Do Not Modify): Private [1]       B Medical/Surgery History Past Medical History:  Diagnosis Date  . Diabetes mellitus without complication (HCC)   . Drug abuse (HCC)   . Hyperlipidemia   . Right arm fracture 2018   Past Surgical History:  Procedure Laterality Date  . CARDIAC SURGERY    . LEFT HEART CATH AND CORONARY ANGIOGRAPHY Right 05/06/2018   Procedure: LEFT HEART CATH AND CORONARY ANGIOGRAPHY;  Surgeon: Laurier Nancy, MD;  Location: [facility] INVASIVE CV LAB;  Service: Cardiovascular;  Laterality: Right;     A IV Location/Drains/Wounds Patient Lines/Drains/Airways Status   Active Line/Drains/Airways    Name:   Placement date:   Placement time:   Site:   Days:    Peripheral IV 07/14/18 Right Forearm   07/14/18    1726    Forearm   less than 1   Peripheral IV 07/14/18 Left Antecubital   07/14/18    1750    Antecubital   less than 1   Wound / Incision (Open or Dehisced) 09/23/17 Venous stasis ulcer Toe (Comment  which one) Left   09/23/17    1524    Toe (Comment  which one)   294   Wound / Incision (Open or Dehisced) 09/23/17 Venous stasis ulcer Foot Left;Posterior   09/23/17    1525    Foot   294          Intake/Output Last 24 hours  Intake/Output Summary (Last 24 hours) at 07/14/2018 2042 Last data filed at 07/14/2018 1902 Gross per 24 hour  Intake -  Output 450 ml  Net -450 ml    Labs/Imaging Results for orders placed or performed during the hospital encounter of 07/14/18 (from the past 48 hour(s))  Lipase, blood     Status: Abnormal   Collection Time: 07/14/18  4:41 PM  Result Value Ref Range   Lipase 144 (H) 11 - 51 U/L    Comment: Performed at [facility], [address] [place], [place] [address]  Comprehensive metabolic panel     Status: Abnormal   Collection Time: 07/14/18  4:41 PM  Result Value Ref Range   Sodium 126 (L) 135 - 145 mmol/L   Potassium 4.4  3.5 - 5.1 mmol/L   Chloride 86 (L) 98 - 111 mmol/L   CO2 24 22 - 32 mmol/L   Glucose, Bld 1,063 (HH) 70 - 99 mg/dL    Comment: CRITICAL RESULT CALLED TO, READ BACK BY AND VERIFIED WITH ANGELA ROBBINS AT 1704 07/14/2018.PMF   BUN 28 (H) 6 - 20 mg/dL   Creatinine, Ser 4.96 (H) 0.61 - 1.24 mg/dL   Calcium 9.4 8.9 - 75.9 mg/dL   Total Protein 8.1 6.5 - 8.1 g/dL   Albumin 4.2 3.5 - 5.0 g/dL   AST 19 15 - 41 U/L   ALT 32 0 - 44 U/L   Alkaline Phosphatase 196 (H) 38 - 126 U/L   Total Bilirubin 1.3 (H) 0.3 - 1.2 mg/dL   GFR calc non Af Amer 59 (L) >60 mL/min   GFR calc Af Amer >60 >60 mL/min   Anion gap 16 (H) 5 - 15    Comment: Performed at [facility] [address] [place], [place] [address]  CBC     Status: Abnormal   Collection Time: 07/14/18  4:41 PM   Result Value Ref Range   WBC 7.8 4.0 - 10.5 K/uL   RBC 4.39 4.22 - 5.81 MIL/uL   Hemoglobin 11.5 (L) 13.0 - 17.0 g/dL   HCT 66.5 (L) 99.3 - 57.0 %   MCV 83.6 80.0 - 100.0 fL   MCH 26.2 26.0 - 34.0 pg   MCHC 31.3 30.0 - 36.0 g/dL   RDW 17.7 93.9 - 03.0 %   Platelets 308 150 - 400 K/uL   nRBC 0.0 0.0 - 0.2 %    Comment: Performed at [facility], [address] [place], [place] [address]  Glucose, capillary     Status: Abnormal   Collection Time: 07/14/18  5:43 PM  Result Value Ref Range   Glucose-Capillary >600 (HH) 70 - 99 mg/dL  Glucose, capillary     Status: Abnormal   Collection Time: 07/14/18  6:45 PM  Result Value Ref Range   Glucose-Capillary >600 (HH) 70 - 99 mg/dL  Urinalysis, Complete w Microscopic     Status: Abnormal   Collection Time: 07/14/18  7:01 PM  Result Value Ref Range   Color, Urine STRAW (A) YELLOW   APPearance CLEAR (A) CLEAR   Specific Gravity, Urine 1.026 1.005 - 1.030   pH 6.0 5.0 - 8.0   Glucose, UA >=500 (A) NEGATIVE mg/dL   Hgb urine dipstick SMALL (A) NEGATIVE   Bilirubin Urine NEGATIVE NEGATIVE   Ketones, ur 5 (A) NEGATIVE mg/dL   Protein, ur 007 (A) NEGATIVE mg/dL   Nitrite NEGATIVE NEGATIVE   Leukocytes,Ua NEGATIVE NEGATIVE   RBC / HPF 0-5 0 - 5 RBC/hpf   WBC, UA 0-5 0 - 5 WBC/hpf   Bacteria, UA NONE SEEN NONE SEEN   Squamous Epithelial / LPF NONE SEEN 0 - 5    Comment: Performed at [facility], [address] [place], [place] [address]  Glucose, capillary     Status: Abnormal   Collection Time: 07/14/18  7:42 PM  Result Value Ref Range   Glucose-Capillary >600 (HH) 70 - 99 mg/dL   Comment 1 Document in Chart    US Abdomen Limited Ruq  Result Date: 07/14/2018 CLINICAL DATA:  Pancreatitis EXAM: ULTRASOUND ABDOMEN LIMITED RIGHT UPPER QUADRANT COMPARISON:  CT abdomen 07/03/2017 FINDINGS: Gallbladder: No gallstones or wall thickening visualized. No sonographic Murphy sign noted by sonographer. Common bile duct:  Diameter: 4.2 mm Liver:  No focal lesion identified. Within normal limits in parenchymal echogenicity. Portal vein is patent on color Doppler imaging with normal direction of blood flow towards the liver. IMPRESSION: 1. Normal right upper quadrant ultrasound. Electronically Signed   By: Elige Ko   On: 07/14/2018 19:01    Pending Labs Unresulted Labs (From admission, onward)    Start     Ordered   07/21/18 0500  Creatinine, serum  (enoxaparin (LOVENOX)    CrCl >/= 30 ml/min)  Weekly,   STAT    Comments:  while on enoxaparin therapy    07/14/18 2020   07/14/18 2019  CBC  (enoxaparin (LOVENOX)    CrCl >/= 30 ml/min)  Once,   STAT    Comments:  Baseline for enoxaparin therapy IF NOT ALREADY DRAWN.  Notify MD if PLT < 100 K.    07/14/18 2020   07/14/18 2019  Creatinine, serum  (enoxaparin (LOVENOX)    CrCl >/= 30 ml/min)  Once,   STAT    Comments:  Baseline for enoxaparin therapy IF NOT ALREADY DRAWN.    07/14/18 2020   07/14/18 2008  Basic metabolic panel  Once,   STAT     07/14/18 2007          Vitals/Pain Today's Vitals   07/14/18 1730 07/14/18 1900 07/14/18 1930 07/14/18 2000  BP: (!) 164/109 (!) 153/103 (!) 131/95 122/85  Pulse: 89   76  Resp: 18 16 16 12   Temp:      TempSrc:      SpO2: 99%   100%  Weight:      Height:      PainSc:        Isolation Precautions No active isolations  Medications Medications  dextrose 5 %-0.45 % sodium chloride infusion (has no administration in time range)  insulin regular, human (MYXREDLIN) 100 units/ 100 mL infusion (16.2 Units/hr Intravenous Rate/Dose Change 07/14/18 1943)  ondansetron (ZOFRAN) 4 MG/2ML injection (  Canceled Entry 07/14/18 1758)  0.9 %  sodium chloride infusion ( Intravenous New Bag/Given 07/14/18 2018)  enoxaparin (LOVENOX) injection 40 mg (has no administration in time range)  acetaminophen (TYLENOL) tablet 650 mg (has no administration in time range)    Or  acetaminophen (TYLENOL) suppository 650 mg (has no  administration in time range)  senna-docusate (Senokot-S) tablet 1 tablet (has no administration in time range)  ondansetron (ZOFRAN) tablet 4 mg (has no administration in time range)    Or  ondansetron (ZOFRAN) injection 4 mg (has no administration in time range)  sodium chloride flush (NS) 0.9 % injection 3 mL (3 mLs Intravenous Given 07/14/18 1758)  sodium chloride 0.9 % bolus 1,000 mL (0 mLs Intravenous Stopped 07/14/18 2018)  sodium chloride 0.9 % bolus 1,000 mL (0 mLs Intravenous Stopped 07/14/18 1900)  ondansetron (ZOFRAN) injection 4 mg (4 mg Intravenous Given 07/14/18 1756)    Mobility walks Low fall risk   Focused Assessments Cardiac Assessment Handoff:    Lab Results  Component Value Date   TROPONINI 1.02 (HH) 05/05/2018   No results found for: DDIMER Does the Patient currently have chest pain? No     R Recommendations: See Admitting Provider Note  Report given to:   Additional Notes:

## 2018-07-14 NOTE — Progress Notes (Signed)
eLink Physician-Brief Progress Note Patient Name: Jesse Christian DOB: 01/16/1971 MRN: [id number]   Date of Service  07/14/2018  HPI/Events of Note  47/M with insulin dependent DM presenting with nausea and vomiting, had not taken Levemir and metformin.  Glucose was 1063 on admission with no anion gap.   Pt is awake and alertm, responding appropriately.  He is not in respiratory distress.  He is hemodynamically stable.   eICU Interventions  Continue insulin gtt.       Intervention Category Evaluation Type: New Patient Evaluation  Larinda Buttery 07/14/2018, 10:18 PM

## 2018-07-14 NOTE — H&P (Signed)
[facility] [place] [facility]   PATIENT NAME: Jesse Christian    MR#:  [id number]  DATE OF BIRTH:  11-15-1970  DATE OF ADMISSION:  07/14/2018  PRIMARY CARE PHYSICIAN: Dorcas Carrow, DO   REQUESTING/REFERRING PHYSICIAN:   CHIEF COMPLAINT:   Chief Complaint  Patient presents with  . Emesis    HISTORY OF PRESENT ILLNESS: Bruk Chopin  is a 48 y.o. male with a known history of diabetes mellitus insulin-dependent presented to the emergency room for nausea and vomiting.  Patient unable to eat because of the vomitings.  Says he has been compliant with his medication for diabetes.  Blood sugar today in the emergency room is more than thousand.  Anion gap is normal.  Patient not in ketoacidosis.  Patient was started on IV insulin drip for control of blood sugar.  Mental status is normal.  No recent travel or sick contacts.  No fever, chills, cough or body aches.  PAST MEDICAL HISTORY:   Past Medical History:  Diagnosis Date  . Diabetes mellitus without complication (HCC)   . Drug abuse (HCC)   . Hyperlipidemia   . Right arm fracture 2018    PAST SURGICAL HISTORY:  Past Surgical History:  Procedure Laterality Date  . CARDIAC SURGERY    . LEFT HEART CATH AND CORONARY ANGIOGRAPHY Right 05/06/2018   Procedure: LEFT HEART CATH AND CORONARY ANGIOGRAPHY;  Surgeon: Laurier Nancy, MD;  Location: [facility] INVASIVE CV LAB;  Service: Cardiovascular;  Laterality: Right;    SOCIAL HISTORY:  Social History   Tobacco Use  . Smoking status: Former Smoker    Packs/day: 0.50    Types: Cigarettes  . Smokeless tobacco: Never Used  Substance Use Topics  . Alcohol use: No    FAMILY HISTORY:  Family History  Problem Relation Age of Onset  . Mental illness Mother   . Heart attack Father   . Heart disease Father   . Mental illness Sister   . Cancer Maternal Aunt        Lung cancer  . Prostate cancer Neg Hx   . Bladder Cancer Neg Hx   . Kidney cancer Neg Hx      DRUG ALLERGIES:  Allergies  Allergen Reactions  . Lyrica [Pregabalin] Other (See Comments)    Peripheral edema    REVIEW OF SYSTEMS:   CONSTITUTIONAL: No fever, fatigue or weakness.  EYES: No blurred or double vision.  EARS, NOSE, AND THROAT: No tinnitus or ear pain.  RESPIRATORY: No cough, shortness of breath, wheezing or hemoptysis.  CARDIOVASCULAR: No chest pain, orthopnea, edema.  GASTROINTESTINAL: Has nausea, vomiting,  No diarrhea or abdominal pain.  GENITOURINARY: No dysuria, hematuria.  ENDOCRINE: No polyuria, nocturia,  HEMATOLOGY: No anemia, easy bruising or bleeding SKIN: No rash or lesion. MUSCULOSKELETAL: No joint pain or arthritis.   NEUROLOGIC: No tingling, numbness, weakness.  PSYCHIATRY: No anxiety or depression.   MEDICATIONS AT HOME:  Prior to Admission medications   Medication Sig Start Date End Date Taking? Authorizing Provider  aspirin 81 MG chewable tablet Chew 81 mg by mouth daily.     [provider]  atorvastatin (LIPITOR) 40 MG tablet Take 1 tablet (40 mg total) by mouth daily at 6 PM. 07/01/18   Laural Benes, [name], DO  clopidogrel (PLAVIX) 75 MG tablet Take 1 tablet (75 mg total) by mouth daily. 07/01/18   [name], [name], DO  insulin detemir (LEVEMIR) 100 UNIT/ML injection Inject 0.2 mLs (20 Units total)  into the skin at bedtime. 07/01/18   [name], [name], DO  metFORMIN (GLUCOPHAGE XR) 500 MG 24 hr tablet Take 2 tablets (1,000 mg total) by mouth 2 (two) times daily. 07/01/18   [name], [name], DO  methadone (DOLOPHINE) 10 MG tablet Take 130 mg by mouth daily.     [provider]      PHYSICAL EXAMINATION:   VITAL SIGNS: Blood pressure 122/85, pulse 76, temperature 98 F (36.7 C), temperature source Oral, resp. rate 12, height 6' (1.829 m), weight 63.5 kg, SpO2 100 %.  GENERAL:  48 y.o.-year-old patient lying in the bed with no acute distress.  EYES: Pupils equal, round, reactive to light and accommodation. No scleral  icterus. Extraocular muscles intact.  HEENT: Head atraumatic, normocephalic. Oropharynx dry and nasopharynx clear.  NECK:  Supple, no jugular venous distention. No thyroid enlargement, no tenderness.  LUNGS: Normal breath sounds bilaterally, no wheezing, rales,rhonchi or crepitation. No use of accessory muscles of respiration.  CARDIOVASCULAR: S1, S2 normal. No murmurs, rubs, or gallops.  ABDOMEN: Soft, nontender, nondistended. Bowel sounds present. No organomegaly or mass.  EXTREMITIES: No pedal edema, cyanosis, or clubbing.  NEUROLOGIC: Cranial nerves II through XII are intact. Muscle strength 5/5 in all extremities. Sensation intact. Gait not checked.  PSYCHIATRIC: The patient is alert and oriented x 3.  SKIN: No obvious rash, lesion, or ulcer.   LABORATORY PANEL:   CBC Recent Labs  Lab 07/14/18 1641  WBC 7.8  HGB 11.5*  HCT 36.7*  PLT 308  MCV 83.6  MCH 26.2  MCHC 31.3  RDW 14.3   ------------------------------------------------------------------------------------------------------------------  Chemistries  Recent Labs  Lab 07/14/18 1641  NA 126*  K 4.4  CL 86*  CO2 24  GLUCOSE 1,063*  BUN 28*  CREATININE 1.42*  CALCIUM 9.4  AST 19  ALT 32  ALKPHOS 196*  BILITOT 1.3*   ------------------------------------------------------------------------------------------------------------------ estimated creatinine clearance is 59 mL/min (A) (by C-G formula based on SCr of 1.42 mg/dL (H)). ------------------------------------------------------------------------------------------------------------------ No results for input(s): TSH, T4TOTAL, T3FREE, THYROIDAB in the last 72 hours.  Invalid input(s): FREET3   Coagulation profile No results for input(s): INR, PROTIME in the last 168 hours. ------------------------------------------------------------------------------------------------------------------- No results for input(s): DDIMER in the last 72  hours. -------------------------------------------------------------------------------------------------------------------  Cardiac Enzymes No results for input(s): CKMB, TROPONINI, MYOGLOBIN in the last 168 hours.  Invalid input(s): CK ------------------------------------------------------------------------------------------------------------------ Invalid input(s): POCBNP  ---------------------------------------------------------------------------------------------------------------  Urinalysis    Component Value Date/Time   COLORURINE STRAW (A) 07/14/2018 1901   APPEARANCEUR CLEAR (A) 07/14/2018 1901   APPEARANCEUR Clear 07/01/2018 1420   LABSPEC 1.026 07/14/2018 1901   LABSPEC 1.022 11/27/2012 0630   PHURINE 6.0 07/14/2018 1901   GLUCOSEU >=500 (A) 07/14/2018 1901   GLUCOSEU 150 mg/dL 16/10/960408/20/2014 [id number]   HGBUR SMALL (A) 07/14/2018 1901   BILIRUBINUR NEGATIVE 07/14/2018 1901   BILIRUBINUR Negative 07/01/2018 1420   BILIRUBINUR Negative 11/27/2012 0630   KETONESUR 5 (A) 07/14/2018 1901   PROTEINUR 100 (A) 07/14/2018 1901   UROBILINOGEN 0.2 10/06/2014 1718   NITRITE NEGATIVE 07/14/2018 1901   LEUKOCYTESUR NEGATIVE 07/14/2018 1901   LEUKOCYTESUR Negative 11/27/2012 0630     RADIOLOGY: [place] Abdomen Limited Ruq  Result Date: 07/14/2018 CLINICAL DATA:  Pancreatitis EXAM: ULTRASOUND ABDOMEN LIMITED RIGHT UPPER QUADRANT COMPARISON:  CT abdomen 07/03/2017 FINDINGS: Gallbladder: No gallstones or wall thickening visualized. No sonographic Murphy sign noted by sonographer. Common bile duct: Diameter: 4.2 mm Liver: No focal lesion identified. Within normal limits in parenchymal echogenicity. Portal vein is patent  on color Doppler imaging with normal direction of blood flow towards the liver. IMPRESSION: 1. Normal right upper quadrant ultrasound. Electronically Signed   By: Elige Ko   On: 07/14/2018 19:01    EKG: Orders placed or performed during the hospital encounter of 07/14/18  .  ED EKG  . ED EKG    IMPRESSION AND PLAN: 48 year old male patient with a known history of diabetes mellitus insulin-dependent presented to the emergency room for nausea and vomiting.   -Acute hyperglycemia with non-ketosis Poorly controlled diabetes mellitus Admit patient to stepdown unit IV insulin drip for blood sugar control IV fluid hydration  -Intractable nausea vomiting Antiemetics intravenously  -Dehydration IV fluids  -Acute hyponatremia IV hydration with normal saline  All the records are reviewed and case discussed with ED provider. Management plans discussed with the patient, family and they are in agreement.  CODE STATUS:    Code Status Orders  (From admission, onward)         Start     Ordered   07/14/18 2020  Full code  Continuous     07/14/18 2020        Code Status History    Date Active Date Inactive Code Status Order ID Comments User Context   05/06/2018 0504 05/12/2018 1435 Full Code [id number]  Arnaldo Natal, MD Inpatient   09/23/2017 1515 09/25/2017 1750 Full Code [id number]  Ramonita Lab, MD Inpatient   12/26/2015 2110 12/27/2015 1954 Full Code [id number]  Altamese Dilling, MD Inpatient       TOTAL CRITICAL CARE TIME TAKING CARE OF THIS PATIENT: 54 minutes.    Ihor Austin M.D on 07/14/2018 at 8:28 PM  Between 7am to 6pm - Pager - [phone]  After 6pm go to [email or web address] - password [email or web address] [facility]  [facility]  Office  [phone]  CC: Primary care physician; Dorcas Carrow, DO

## 2018-07-14 NOTE — ED Notes (Signed)
Jesse Christian notified of wrong RN notified for room assisgment

## 2018-07-14 NOTE — ED Notes (Signed)
First Nurse Note: Pt to ED via POV, pt states that he has been vomiting x 4 days. Pt reports being unable to keep anything down, including medications. Pt states that he had Duel open heart surgery on Feb. 2, 2020 at [facility]. Pt pale on arrival

## 2018-07-14 NOTE — Consult Note (Addendum)
Name: Jesse Christian MRN: [id number] DOB: Aug 27, 1970    ADMISSION DATE:  07/14/2018 CONSULTATION DATE:  07/14/2018  REFERRING MD :  Dr. Tobi BastosPyreddy  CHIEF COMPLAINT:  Nausea, Vomiting, Marked Hyperglycemia  BRIEF PATIENT DESCRIPTION: 48 y.o. Male with PMH of insulin dependent DM II and CAD s/p CABG who is admitted on 4/5 with HHS and AKI in setting of 4 day history of Nausea and vomiting, of which he did not take his outpatient Metformin and Levemir.  Lipase was elevated in ED, but pt doesn't meet criteria for Pancreatitis as pt without abdominal pain and Lipase not elevated 3 times greater than upper normal limit.  SIGNIFICANT EVENTS  07/14/18>>[facility]  STUDIES:  RUQ Abdominal Ultrasound 07/14/18>> Normal RUQ Ultrasound  CULTURES:  ANTIBIOTICS:  HISTORY OF PRESENT ILLNESS:   Jesse Christian is a 48 y.o. Male with a PMH notable for insulin dependent DM Type II, CAD s/p CABG, and Hyperlipidemia who [facility] with a 4 day history of nausea and vomiting (nonbloody).  He denies any abdominal pain, fever/chills, diarrhea, or sick contacts.  Given his nausea and vomiting, he has been unable to keep anything PO down, therefore he has not been taking his home Metformin or Levemir.  Initial workup in the ED reveals Glucose 1063, anion gap 16, Bicarb 24, Sodium 125, Creatinine 1.42, and Lipase 144.  Urinalysis has trace ketones (5), and is negative for UTI.   RUQ Abdominal Ultrasound performed in ED is normal.  In the ED, he received 2L Normal saline boluses and was placed on insulin drip.  [facility] for treatment of Hyperosmolar Hyperglycemic State (HHS) and AKI secondary to Dehydration and not taking his outpatient Diabetes medications.  PCCM is consulted for further management.  PAST MEDICAL HISTORY :   has a past medical history of Diabetes mellitus without complication (HCC), Drug abuse (HCC), Hyperlipidemia, and Right arm fracture (2018).  has a past  surgical history that includes LEFT HEART CATH AND CORONARY ANGIOGRAPHY (Right, 05/06/2018) and Cardiac surgery. Prior to Admission medications   Medication Sig Start Date End Date Taking? Authorizing Provider  aspirin 81 MG chewable tablet Chew 81 mg by mouth daily.     [provider]  atorvastatin (LIPITOR) 40 MG tablet Take 1 tablet (40 mg total) by mouth daily at 6 PM. 07/01/18   Laural BenesJohnson, [name], DO  clopidogrel (PLAVIX) 75 MG tablet Take 1 tablet (75 mg total) by mouth daily. 07/01/18   [name], [name], DO  insulin detemir (LEVEMIR) 100 UNIT/ML injection Inject 0.2 mLs (20 Units total) into the skin at bedtime. 07/01/18   [name], [name], DO  metFORMIN (GLUCOPHAGE XR) 500 MG 24 hr tablet Take 2 tablets (1,000 mg total) by mouth 2 (two) times daily. 07/01/18   [name], [name], DO  methadone (DOLOPHINE) 10 MG tablet Take 130 mg by mouth daily.     [provider]   Allergies  Allergen Reactions  . Lyrica [Pregabalin] Other (See Comments)    Peripheral edema    FAMILY HISTORY:  family history includes Cancer in his maternal aunt; Heart attack in his father; Heart disease in his father; Mental illness in his mother and sister. SOCIAL HISTORY:  reports that he has quit smoking. His smoking use included cigarettes. He smoked 0.50 packs per day. He has never used smokeless tobacco. He reports that he does not drink alcohol or use drugs.   REVIEW OF SYSTEMS:  Positives in  BOLD Constitutional: Negative for fever, chills, weight loss, +malaise/fatigue and diaphoresis.  HENT: Negative for hearing loss, ear pain, nosebleeds, congestion, sore throat, neck pain, tinnitus and ear discharge.   Eyes: Negative for blurred vision, double vision, photophobia, pain, discharge and redness.  Respiratory: Negative for cough, hemoptysis, sputum production, shortness of breath, wheezing and stridor.   Cardiovascular: Negative for chest pain, palpitations, orthopnea, claudication, leg swelling  and PND.  Gastrointestinal: Negative for heartburn, nausea, vomiting, abdominal pain, diarrhea, constipation, blood in stool and melena.  Genitourinary: Negative for dysuria, urgency, frequency, hematuria and flank pain.  Musculoskeletal: Negative for myalgias, back pain, joint pain and falls.  Skin: Negative for itching and rash.  Neurological: Negative for dizziness, tingling, tremors, sensory change, speech change, focal weakness, seizures, loss of consciousness, weakness and headaches.  Endo/Heme/Allergies: Negative for environmental allergies and polydipsia. Does not bruise/bleed easily.  SUBJECTIVE:  Pt reports that he is weak, thirsty, and hungry; wants to eat Denies abdominal pain, N/V, shortness of breath, chest pain, fever/chills On Room air  VITAL SIGNS: Temp:  [98 F (36.7 C)] 98 F (36.7 C) (04/05 1635) Pulse Rate:  [76-97] 78 (04/05 2100) Resp:  [10-24] 19 (04/05 2115) BP: (113-164)/(79-111) 113/79 (04/05 2100) SpO2:  [96 %-100 %] 100 % (04/05 2115) Weight:  [63.5 kg] 63.5 kg (04/05 1633)  PHYSICAL EXAMINATION: General:  Acutely ill appearing male, sitting in bed, on room air, in NAD Neuro:  Awake, A&Ox4, Follows commands, speech is clear, no focal deficits HEENT:  Atraumatic, normocephalic, neck supple, no JVD Cardiovascular:  RRR, s1s2, no M/R/G, 2+ pulses Lungs:  Clear to auscultation bilaterally, even, nonlabored, normal effort Abdomen:  Soft, nontender, nondistended, no guarding or rebound tenderness, BS+ x4 Musculoskeletal:  Normal bulk and tone, no deformities, no edema Skin:  Warm/dry.  No obvious rashes, lesions, or ulcerations  Recent Labs  Lab 07/14/18 1641 07/14/18 2019  NA 126* 134*  K 4.4 3.4*  CL 86* 98  CO2 24 27  BUN 28* 25*  CREATININE 1.42* 1.21  GLUCOSE 1,063* 550*   Recent Labs  Lab 07/14/18 1641  HGB 11.5*  HCT 36.7*  WBC 7.8  PLT 308   US Abdomen Limited Ruq  Result Date: 07/14/2018 CLINICAL DATA:  Pancreatitis EXAM:  ULTRASOUND ABDOMEN LIMITED RIGHT UPPER QUADRANT COMPARISON:  CT abdomen 07/03/2017 FINDINGS: Gallbladder: No gallstones or wall thickening visualized. No sonographic Murphy sign noted by sonographer. Common bile duct: Diameter: 4.2 mm Liver: No focal lesion identified. Within normal limits in parenchymal echogenicity. Portal vein is patent on color Doppler imaging with normal direction of blood flow towards the liver. IMPRESSION: 1. Normal right upper quadrant ultrasound. Electronically Signed   By: Elige Ko   On: 07/14/2018 19:01    ASSESSMENT / PLAN:  Hyperosmolar Hyperglycemic State (HHS) [original glucose >1000, slightly elevated Anion Gap, trace Ketones on Urinalysis, Bicarb normal] -IVF -Insulin drip -Follow BMP and CBG's -Check serum Beta-Hydroxybutyric acid>>0.22 -Once Glucose <250, can convert to Long acting insulin (pt takes 20 units Levemir daily at bedtime at home) and SSI -Consult Diabetes coordinator, appreciate input  Elevated Lipase -Add on Amylase>>87 (normal) -Pt technically doesn't meet criteria for Pancreatitis as elevated Lipase is not 3x greater than upper normal limit, and no complaints of abdominal pain -RUQ Abdominal US performed in ED is normal -Trend Amylase & Lipase -IVF -Diet as tolerated as pt without abdominal pain  Nausea & Vomiting -Continue Antiemetics -PO intake as tolerated  AKI in setting of Dehydration Hypertonic Hyponatremia (corrected Na  given glucose of 1063 is Na 141) -Monitor I&O's / urinary output -Follow BMP -Ensure adequate renal perfusion -Avoid nephrotoxic agents as able -Replace electrolytes as indicated -IVF  Anemia without signs of active bleeding Monitor for S/Sx of bleeding Trend CBC Lovenox for VTE Prophylaxis  Transfuse for Hgb <7    DISPOSITION: STEPDOWN GOALS OF CARE: FULL CODE VTE PROPHYLAXIS: LOVENOX UPDATES: UPDATED PT AT BEDSIDE 07/14/2018.  Harlon Ditty, AGACNP-BC [facility]  Medicine Pager: [phone] Cell: [phone]  07/14/2018, 9:22 PM

## 2018-07-14 NOTE — ED Triage Notes (Signed)
Pt to ED via POV c/o vomiting x 4 days. Pt states that he is unable to keep anything down. Pt states that he has vomited "too many times to count" in the last 24 hours. Pt denies fever and diarrhea. Pt is pale on arrival.

## 2018-07-14 NOTE — ED Provider Notes (Signed)
[facility] Emergency Department Provider Note ____________________________________________   First MD Initiated Contact with Patient 07/14/18 1710     (approximate)  I have reviewed the triage vital signs and the nursing notes.   HISTORY  Chief Complaint Emesis    HPI Jesse Christian is a 48 y.o. male with PMH as noted below including diabetes type 2 on metformin and long-acting insulin who presents with nausea and vomiting over the last 4 days, numerous episodes, watery and nonbloody, and not associated with abdominal pain, fever, or diarrhea.  The patient states he has been unable to keep anything down and cannot take his metformin.  Past Medical History:  Diagnosis Date  . Diabetes mellitus without complication ([place])   . Drug abuse ([place])   . Hyperlipidemia   . Right arm fracture 2018    Patient Active Problem List   Diagnosis Date Noted  . Hyperglycemia without ketosis 07/14/2018  . Malnutrition of moderate degree 05/07/2018  . Acute systolic heart failure ([place]) 05/06/2018  . Foot infection 09/23/2017  . Benign prostatic hyperplasia with nocturia 06/26/2016  . Anxiety disorder due to medical condition 06/22/2015  . Insomnia 06/22/2015  . Hep C w/o coma, chronic ([place]) 06/10/2015  . Benign hypertensive renal disease 06/08/2015  . History of drug abuse in remission ([place]) 06/08/2015  . Diabetes mellitus with hyperglycemia, with long-term current use of insulin ([place])   . Hyperlipidemia     Past Surgical History:  Procedure Laterality Date  . CARDIAC SURGERY    . LEFT HEART CATH AND CORONARY ANGIOGRAPHY Right 05/06/2018   Procedure: LEFT HEART CATH AND CORONARY ANGIOGRAPHY;  Surgeon: Dionisio David, MD;  Location: [place] [facility];  Service: Cardiovascular;  Laterality: Right;    Prior to Admission medications   Medication Sig Start Date End Date Taking? Authorizing Provider  aspirin 81 MG chewable tablet Chew 81 mg by mouth daily.      [provider]  atorvastatin (LIPITOR) 40 MG tablet Take 1 tablet (40 mg total) by mouth daily at 6 PM. 07/01/18   Wynetta Emery, [name], DO  clopidogrel (PLAVIX) 75 MG tablet Take 1 tablet (75 mg total) by mouth daily. 07/01/18   [name], [name], DO  insulin detemir (LEVEMIR) 100 UNIT/ML injection Inject 0.2 mLs (20 Units total) into the skin at bedtime. 07/01/18   [name], [name], DO  metFORMIN (GLUCOPHAGE XR) 500 MG 24 hr tablet Take 2 tablets (1,000 mg total) by mouth 2 (two) times daily. 07/01/18   [name], [name], DO  methadone (DOLOPHINE) 10 MG tablet Take 130 mg by mouth daily.     [provider]    Allergies Lyrica [pregabalin]  Family History  Problem Relation Age of Onset  . Mental illness Mother   . Heart attack Father   . Heart disease Father   . Mental illness Sister   . Cancer Maternal Aunt        Lung cancer  . Prostate cancer Neg Hx   . Bladder Cancer Neg Hx   . Kidney cancer Neg Hx     Social History Social History   Tobacco Use  . Smoking status: Former Smoker    Packs/day: 0.50    Types: Cigarettes  . Smokeless tobacco: Never Used  Substance Use Topics  . Alcohol use: No  . Drug use: No    Review of Systems  Constitutional: No fever. Eyes: No redness. ENT: No sore throat. Cardiovascular: Denies chest pain. Respiratory: Denies  shortness of breath. Gastrointestinal: Positive for nausea and vomiting.  No diarrhea. Genitourinary: Negative for dysuria or frequency.  Musculoskeletal: Negative for back pain. Skin: Negative for rash. Neurological: Negative for headache.   ____________________________________________   PHYSICAL EXAM:  VITAL SIGNS: ED Triage Vitals  Enc Vitals Group     BP 07/14/18 1635 (!) 148/98     Pulse Rate 07/14/18 1635 97     Resp 07/14/18 1635 16     Temp 07/14/18 1635 98 F (36.7 C)     Temp Source 07/14/18 1635 Oral     SpO2 07/14/18 1635 100 %     Weight 07/14/18 1633 140 lb (63.5 kg)     Height  07/14/18 1633 6' (1.829 m)     Head Circumference --      Peak Flow --      Pain Score 07/14/18 1633 0     Pain Loc --      Pain Edu? --      Excl. in [place]? --     Constitutional: Alert and oriented.  Somewhat weak and frail appearing but in no acute distress. Eyes: Conjunctivae are normal.  No scleral icterus. Head: Atraumatic. Nose: No congestion/rhinnorhea. Mouth/Throat: Mucous membranes are dry.   Neck: Normal range of motion.  Cardiovascular: Normal rate, regular rhythm. Good peripheral circulation. Respiratory: Normal respiratory effort.  No retractions. Gastrointestinal: Soft and nontender. No distention.  Genitourinary: No flank tenderness. Musculoskeletal: Extremities warm and well perfused.  Neurologic:  Normal speech and language. No gross focal neurologic deficits are appreciated.  Skin:  Skin is warm and dry. No rash noted. Psychiatric: Mood and affect are normal. Speech and behavior are normal.  ____________________________________________   LABS (all labs ordered are listed, but only abnormal results are displayed)  Labs Reviewed  LIPASE, BLOOD - Abnormal; Notable for the following components:      Result Value   Lipase 144 (*)    All other components within normal limits  COMPREHENSIVE METABOLIC PANEL - Abnormal; Notable for the following components:   Sodium 126 (*)    Chloride 86 (*)    Glucose, Bld 1,063 (*)    BUN 28 (*)    Creatinine, Ser 1.42 (*)    Alkaline Phosphatase 196 (*)    Total Bilirubin 1.3 (*)    GFR calc non Af Amer 59 (*)    Anion gap 16 (*)    All other components within normal limits  CBC - Abnormal; Notable for the following components:   Hemoglobin 11.5 (*)    HCT 36.7 (*)    All other components within normal limits  URINALYSIS, COMPLETE (UACMP) WITH MICROSCOPIC - Abnormal; Notable for the following components:   Color, Urine STRAW (*)    APPearance CLEAR (*)    Glucose, UA >=500 (*)    Hgb urine dipstick SMALL (*)     Ketones, ur 5 (*)    Protein, ur 100 (*)    All other components within normal limits  GLUCOSE, CAPILLARY - Abnormal; Notable for the following components:   Glucose-Capillary >600 (*)    All other components within normal limits  GLUCOSE, CAPILLARY - Abnormal; Notable for the following components:   Glucose-Capillary >600 (*)    All other components within normal limits  GLUCOSE, CAPILLARY - Abnormal; Notable for the following components:   Glucose-Capillary >600 (*)    All other components within normal limits  BASIC METABOLIC PANEL  CBC  CREATININE, SERUM   ____________________________________________  EKG  ED ECG REPORT I, Arta Silence, the attending physician, personally viewed and interpreted this ECG.  Date: 07/14/2018 EKG Time: 1851 Rate: 83 Rhythm: normal sinus rhythm QRS Axis: normal Intervals: normal ST/T Wave abnormalities: Nonspecific ST abnormalities laterally Narrative Interpretation: no evidence of acute ischemia ____________________________________________  RADIOLOGY  US abdomen RUQ: No acute abnormality  ____________________________________________   PROCEDURES  Procedure(s) performed: No  Procedures  Critical Care performed: Yes  CRITICAL CARE Performed by: Arta Silence   Total critical care time: 45 minutes  Critical care time was exclusive of separately billable procedures and treating other patients.  Critical care was necessary to treat or prevent imminent or life-threatening deterioration.  Critical care was time spent personally by me on the following activities: development of treatment plan with patient and/or surrogate as well as nursing, discussions with consultants, evaluation of patient's response to treatment, examination of patient, obtaining history from patient or surrogate, ordering and performing treatments and interventions, ordering and review of laboratory studies, ordering and review of radiographic  studies, pulse oximetry and re-evaluation of patient's condition. ____________________________________________   INITIAL IMPRESSION / ASSESSMENT AND PLAN / ED COURSE  Pertinent labs & imaging results that were available during my care of the patient were reviewed by me and considered in my medical decision making (see chart for details).  48 year old male with history of diabetes as well as CAD status post recent CABG and other PMH as noted above presents with nausea and vomiting over the last 4 days with no abdominal pain, diarrhea, or fever.  He normally takes metformin and a long-acting insulin at night.  I reviewed the past medical records in epic; the patient was admitted for ACS in February and transferred to [facility] for CABG.  He states he has been doing relatively well since he left the hospital although he has trouble with compliance with all of his medications that he was started on.  On exam the patient is somewhat weak and frail appearing for his age.  His vital signs are normal except for hypertension.  The abdomen is soft with no focal tenderness.  Initial labs obtained from triage reveal significant hyperglycemia with slightly elevated anion gap, as well as lipase, alk phos and bilirubins that are elevated.  The patient had previous alk phos elevation but not the other findings.  Overall presentation is consistent with [place] versus DKA.  Given the abnormal lipase and bilirubins I am concerned for hepatobiliary cause precipitating this, such as cholecystitis or bile duct stone.  We will obtain a right upper quadrant ultrasound, give fluids and start the patient on an insulin drip and reassess.  Anticipate admission.  ----------------------------------------- 8:28 PM on 07/14/2018 -----------------------------------------  Patient has been on insulin infusion and has received 2 L of normal saline.  He is able to drink water and hold it down.  Right upper quadrant ultrasound shows no  acute abnormalities.  Given the persistent significant hyperglycemia and findings consistent with pancreatitis, the patient will require admission.  I signed him out to the hospitalist Dr. Jannifer Franklin. ____________________________________________   FINAL CLINICAL IMPRESSION(S) / ED DIAGNOSES  Final diagnoses:  Pancreatitis  Diabetic ketoacidosis without coma associated with type 2 diabetes mellitus (HCC)      NEW MEDICATIONS STARTED DURING THIS VISIT:  New Prescriptions   No medications on file     Note:  This document was prepared using Dragon voice recognition software and may include unintentional dictation errors.    Arta Silence, MD 07/14/18 2028

## 2018-07-15 LAB — CBC
HCT: 32.8 % — ABNORMAL LOW (ref 39.0–52.0)
Hemoglobin: 10.5 g/dL — ABNORMAL LOW (ref 13.0–17.0)
MCH: 26.6 pg (ref 26.0–34.0)
MCHC: 32 g/dL (ref 30.0–36.0)
MCV: 83 fL (ref 80.0–100.0)
Platelets: 279 10*3/uL (ref 150–400)
RBC: 3.95 MIL/uL — ABNORMAL LOW (ref 4.22–5.81)
RDW: 13.8 % (ref 11.5–15.5)
WBC: 9.3 10*3/uL (ref 4.0–10.5)
nRBC: 0 % (ref 0.0–0.2)

## 2018-07-15 LAB — COMPREHENSIVE METABOLIC PANEL
ALT: 22 U/L (ref 0–44)
AST: 16 U/L (ref 15–41)
Albumin: 3.6 g/dL (ref 3.5–5.0)
Alkaline Phosphatase: 122 U/L (ref 38–126)
Anion gap: 9 (ref 5–15)
BUN: 22 mg/dL — ABNORMAL HIGH (ref 6–20)
CO2: 28 mmol/L (ref 22–32)
Calcium: 8.9 mg/dL (ref 8.9–10.3)
Chloride: 100 mmol/L (ref 98–111)
Creatinine, Ser: 0.95 mg/dL (ref 0.61–1.24)
GFR calc Af Amer: >60 mL/min (ref >60)
GFR calc non Af Amer: >60 mL/min (ref >60)
Glucose, Bld: 145 mg/dL — ABNORMAL HIGH (ref 70–99)
Potassium: 3.6 mmol/L (ref 3.5–5.1)
Sodium: 137 mmol/L (ref 135–145)
Total Bilirubin: 0.7 mg/dL (ref 0.3–1.2)
Total Protein: 7 g/dL (ref 6.5–8.1)

## 2018-07-15 LAB — GLUCOSE, CAPILLARY
Glucose-Capillary: 119 mg/dL — ABNORMAL HIGH (ref 70–99)
Glucose-Capillary: 172 mg/dL — ABNORMAL HIGH (ref 70–99)
Glucose-Capillary: 192 mg/dL — ABNORMAL HIGH (ref 70–99)
Glucose-Capillary: 227 mg/dL — ABNORMAL HIGH (ref 70–99)
Glucose-Capillary: 257 mg/dL — ABNORMAL HIGH (ref 70–99)
Glucose-Capillary: 284 mg/dL — ABNORMAL HIGH (ref 70–99)

## 2018-07-15 LAB — LIPASE, BLOOD: Lipase: 52 U/L — ABNORMAL HIGH (ref 11–51)

## 2018-07-15 LAB — AMYLASE: Amylase: 89 U/L (ref 28–100)

## 2018-07-15 MED ORDER — SODIUM CHLORIDE 0.9 % IV SOLN
INTRAVENOUS | Status: DC
  Administered 2018-07-15: 02:00:00 via INTRAVENOUS

## 2018-07-15 MED ORDER — GLUCERNA SHAKE PO LIQD: 237.0000 mL | Freq: Two times a day (BID) | ORAL | Status: DC

## 2018-07-15 NOTE — Progress Notes (Signed)
Initial Nutrition Assessment  RD working remotely.  DOCUMENTATION CODES:   Not applicable  INTERVENTION:   - Add Glucerna BID (each provides 220 kcal, 10 g protein)  NUTRITION DIAGNOSIS:   Unintentional weight loss related to chronic illness as evidenced by percent weight loss.  GOAL:   Patient will meet greater than or equal to 90% of their needs  MONITOR:   PO intake, Supplement acceptance, Labs, Weight trends  REASON FOR ASSESSMENT:   Malnutrition Screening Tool    ASSESSMENT:   48 yo male, admitted with emesis, hyperglycemia without ketosis. PMH significant for IDDM, HLD, h/o drug abuse.  Labs: glucose 145 mg/dL, V3X 10.6% on 2/69/4854, BUN 22 (H)  Meds: Lipitor, Novolog, Levemir  RD spoke with patient on phone. Reports eating well PTA. Describes trouble with Metformin. Unsure of recent wt loss. Discussed importance of monitoring CHO intake to maintain appropriate blood glucose levels. Amenable to trying Glucerna BID in strawberry.  NUTRITION - FOCUSED PHYSICAL EXAM: Deferred - RD working remotely  Diet Order:  No known food allergies 4/5 NPO Diet Order            Diet Carb Modified Fluid consistency: Thin; Room service appropriate? Yes  Diet effective now            PO Intake No meals recorded  EDUCATION NEEDS:  Education needs have been addressed  Skin:  Skin Assessment: Skin Integrity Issues: Skin Integrity Issues:: Incisions Incisions: medial chest  Last BM:  4/5  Height:  Ht Readings from Last 1 Encounters:  07/14/18 6' (1.829 m)   Weight:  Wt Readings from Last 10 Encounters:  07/14/18 63.5 kg  07/01/18 67.6 kg  05/12/18 69.2 kg  09/23/17 70.3 kg  07/03/17 74.8 kg  11.3 kg wt loss x 1 year = 15% 5.7 kg wt loss x 2 months = 8% may be indicative of malnutrition  Ideal Body Weight:  80.9 kg  BMI:  Body mass index is 18.99 kg/m. normal  Estimated Nutritional Needs:   Kcal:  1600-1920 (25-30 kcal/kg)  Protein:  64-77 gm (1-1.2  g/kg)  Fluid:  1 mL/kcal or per MD  Jolaine Artist, MS, RDN, LDN Pager: [phone] Available Mondays and Fridays, 9am-2pm

## 2018-07-15 NOTE — Progress Notes (Addendum)
Inpatient Diabetes Program Recommendations  AACE/ADA: New Consensus Statement on Inpatient Glycemic Control   Target Ranges:  Prepandial:   less than 140 mg/dL      Peak postprandial:   less than 180 mg/dL (1-2 hours)      Critically ill patients:  140 - 180 mg/dL   Results for Jesse Christian (MRN [id number]) as of 07/15/2018 07:13  Ref. Range 07/14/2018 17:43 07/14/2018 18:45 07/14/2018 19:42 07/14/2018 20:59 07/14/2018 21:35 07/14/2018 22:36 07/14/2018 23:38 07/15/2018 00:38 07/15/2018 01:34  Glucose-Capillary Latest Ref Range: 70 - 99 mg/dL >462 (HH) >863 (HH) >817 (HH) 490 (H) 407 (H) 344 (H) 215 (H) 227 (H) 172 (H)  Results for Jesse Christian (MRN [id number]) as of 07/15/2018 07:13  Ref. Range 07/14/2018 16:41  Glucose Latest Ref Range: 70 - 99 mg/dL 8,333 [facility])   Results for Jesse Christian (MRN [id number]) as of 07/15/2018 07:13  Ref. Range 05/06/2018 07:24  Hemoglobin A1C Latest Ref Range: 4.8 - 5.6 % 11.3 (H)   Review of Glycemic Control  Diabetes history: DM2 Outpatient Diabetes medications: Levemir 20 units QHS, Metformin XR 1000 mg BID Current orders for Inpatient glycemic control: Levemir 20 units QHS, Novolog 0-15 units TID with meals, Novlog 0-5 units QHS  Inpatient Diabetes Program Recommendations:   Insulin - Basal: Noted Levemir 20 units given at 00:04 on 07/15/18 at time of transition from IV to SQ insulin.  Insulin - Meal Coverage: Please consider ordering Novolog 3 units TID with meals for meal coverage if patient eats at least 50% of meals.  HgbA1C: Last A1C 11.3% on 05/06/18 indicating an average glucose of 278 mg/dl. Recommend patient be referred to an Endocrinologist for assistance with improving DM control.   Oral DM medication: Patient reports he was restarted on Metformin in mid February and has been having ongoing issues with nausea for the past 1 month. May want to consider discontinuing Metformin as an outpatient due to patient's report of nausea over the past 1 month.  NOTE:  Noted Diabetes Coordinator consult for HHS. Chart reviewed. Initial glucose 1063 mg/dl and patient was started on IV insulin drip which has been transitioned to SQ insulin. Patient received Levemir 20 units at 00:04 on 07/15/18. Per chart, patient last seen PCP on 07/01/18 (seen Dr. Laural Benes at [facility]) and MD noted  "Stable with A1c of 12.4- not doing well. Will see if he can tolerate his metformin."  Addendum 07/15/18@13 :45-Spoke with patient over the phone about diabetes and home regimen for diabetes control. Patient reports that he is followed by PCP for diabetes management and currently he takes Levemir 20 units QHS and Metformin XR 1000 mg BID as an outpatient for diabetes control. Patient reports that he is taking DM medications as prescribed but notes that over the past couple of days he has been so sick that he does not think he took any DM medications.  Patient states that he checks glucose several times a day and it is usually in the 200's mg/dl (fasting and later in the day).  Discussed A1C results (11.3% on 05/06/18) and explained that his A1C indicates an average glucose of 278 mg/dl which correlates to reported glucose values. Discussed glucose and A1C goals. Discussed importance of checking CBGs and maintaining good CBG control to prevent long-term and short-term complications. Explained how hyperglycemia leads to damage within blood vessels which lead to the common complications seen with uncontrolled diabetes. Stressed to the patient the importance of improving glycemic control to prevent further  complications from uncontrolled diabetes. Discussed impact of nutrition, exercise, stress, sickness, and medications on diabetes control. Discussed sick day rules and stressed that he needs to be sure to check glucose even closer when he is sick and be sure to take insulin.  Inquired about an Actorndocrinologist and patient notes that his PCP stated recently that they were going to refer him to a  local Endocrinologist. However due to COVID19, his PCP was not able to get him an appointment yet.  Encouraged patient to continue to work with his PCP about getting a referral to an Actorndocrinologist.  Encouraged patient to check his glucose 4 times per day (before meals and at bedtime) and to keep a log book of glucose readings and insulin taken which he will need to take to doctor appointments. Explained how the doctor he follows up with can use the log book to continue to make insulin adjustments if needed. Patient verbalized understanding of information discussed and he states that he has no further questions at this time related to diabetes.  Thanks, Jesse PennerMarie Vanity Larsson, RN, MSN, CDE Diabetes Coordinator Inpatient Diabetes Program [phone] (Team Pager from 8am to 5pm)

## 2018-07-15 NOTE — Progress Notes (Signed)
[employer] - [place] [facility]   PATIENT NAME: Octaviano GlowBarry Aburto    MR#:  [id number]  DATE OF BIRTH:  12/22/1970  SUBJECTIVE:  CHIEF COMPLAINT:   Chief Complaint  Patient presents with  . Emesis   No new complaint this morning.  Patient already weaned off insulin drip and orders already placed to transfer out of stepdown unit today.  REVIEW OF SYSTEMS:  Review of Systems  Constitutional: Negative for chills, fever and weight loss.  HENT: Negative for hearing loss and tinnitus.   Eyes: Negative for blurred vision and double vision.  Respiratory: Negative for cough, hemoptysis and shortness of breath.   Cardiovascular: Negative for chest pain, palpitations and orthopnea.  Gastrointestinal: Positive for nausea and vomiting. Negative for abdominal pain, diarrhea and heartburn.  Genitourinary: Negative for dysuria and urgency.  Musculoskeletal: Negative for myalgias and neck pain.  Skin: Negative for itching and rash.  Neurological: Negative for dizziness and headaches.  Psychiatric/Behavioral: Negative for depression and hallucinations.    DRUG ALLERGIES:   Allergies  Allergen Reactions  . Lyrica [Pregabalin] Other (See Comments)    Peripheral edema   VITALS:  Blood pressure (!) 150/88, pulse 91, temperature 98.6 F (37 C), resp. rate 18, height 6' (1.829 m), weight 63.5 kg, SpO2 100 %. PHYSICAL EXAMINATION:   Physical Exam  Constitutional: He is oriented to person, place, and time. He appears well-developed.  HENT:  Head: Normocephalic and atraumatic.  Right Ear: External ear normal.  Eyes: Pupils are equal, round, and reactive to light. Conjunctivae are normal. Right eye exhibits no discharge.  Neck: Normal range of motion. No tracheal deviation present. No thyromegaly present.  Cardiovascular: Normal rate, regular rhythm and normal heart sounds.  Respiratory: Effort normal and breath sounds normal. He has no wheezes.  GI: Soft. Bowel sounds are  normal. There is no abdominal tenderness.  Musculoskeletal: Normal range of motion.        General: Edema present.  Neurological: He is alert and oriented to person, place, and time. No cranial nerve deficit.  Skin: Skin is warm. He is not diaphoretic. No erythema.  Psychiatric: He has a normal mood and affect. His behavior is normal.   LABORATORY PANEL:  Male CBC Recent Labs  Lab 07/15/18 0446  WBC 9.3  HGB 10.5*  HCT 32.8*  PLT 279   ------------------------------------------------------------------------------------------------------------------ Chemistries  Recent Labs  Lab 07/15/18 0446  NA 137  K 3.6  CL 100  CO2 28  GLUCOSE 145*  BUN 22*  CREATININE 0.95  CALCIUM 8.9  AST 16  ALT 22  ALKPHOS 122  BILITOT 0.7   RADIOLOGY:  [place] Abdomen Limited Ruq  Result Date: 07/14/2018 CLINICAL DATA:  Pancreatitis EXAM: ULTRASOUND ABDOMEN LIMITED RIGHT UPPER QUADRANT COMPARISON:  CT abdomen 07/03/2017 FINDINGS: Gallbladder: No gallstones or wall thickening visualized. No sonographic Murphy sign noted by sonographer. Common bile duct: Diameter: 4.2 mm Liver: No focal lesion identified. Within normal limits in parenchymal echogenicity. Portal vein is patent on color Doppler imaging with normal direction of blood flow towards the liver. IMPRESSION: 1. Normal right upper quadrant ultrasound. Electronically Signed   By: Elige KoHetal  Patel   On: 07/14/2018 19:01   ASSESSMENT AND PLAN:    48 year old male patient with a known history of diabetes mellitus insulin-dependent presented to the emergency room for nausea and vomiting.   1.  Diabetes mellitus with hyperglycemia Patient was initially admitted to stepdown unit and started on insulin drip. Patient already weaned off insulin  drip and being transferred out of stepdown unit Already resumed Levemir insulin at 20 units subcu nightly.  Placed on sliding scale insulin coverage already. Last glycosylated hemoglobin in July 01, 2018 was  12.4.  Follow-up on repeat level in a.m.  2. Intractable nausea vomiting Improved with antiemetics.  Monitor  3.  History of CAD status post bypass surgery Stable  4.  Chronic systolic CHF with ejection fraction of 20% Stable.  Was hydrated with IV fluids overnight.  Will discontinue IV fluids to prevent fluid overload.  5.-Dehydration Already adequately hydrated with IV fluids  6.  Acute kidney injury Resolved with IV fluids.  7.Acute hyponatremia Secondary to hyperglycemia.  Resolved with correction of hyperglycemia.  DVT prophylaxis; Lovenox  All the records are reviewed and case discussed with Care Management/Social Worker. Management plans discussed with the patient, family and they are in agreement.  CODE STATUS: Full Code  TOTAL TIME TAKING CARE OF THIS PATIENT: 37 minutes.   More than 50% of the time was spent in counseling/coordination of care: YES  POSSIBLE D/C IN 1-2 DAYS, DEPENDING ON CLINICAL CONDITION.   Emiah Pellicano M.D on 07/15/2018 at 11:34 AM  Between 7am to 6pm - Pager - [phone]  After 6pm go to [email or web address] - Social research officer, government  [employer]  Hospitalists  Office  [phone]  CC: Primary care physician; Dorcas Carrow, DO  Note: This dictation was prepared with Dragon dictation along with smaller phrase technology. Any transcriptional errors that result from this process are unintentional.

## 2018-07-15 NOTE — Progress Notes (Signed)
   07/15/18 1400  Clinical Encounter Type  Visited With Patient  Visit Type Initial  Referral From Chaplain  Consult/Referral To Chaplain  Spiritual Encounters  Spiritual Needs Emotional  Chaplain was rounding by phone calls and called patient to check on him after moving from ICU. Chaplain offered if there was anything she could do for patient and patient stated no thanks, not at this time. Chaplain gave continued blessing for healing. Patient was very appreciative for phone call.

## 2018-07-16 ENCOUNTER — Encounter: Payer: Self-pay | Admitting: *Deleted

## 2018-07-16 LAB — BASIC METABOLIC PANEL
Anion gap: 7 (ref 5–15)
BUN: 16 mg/dL (ref 6–20)
CO2: 25 mmol/L (ref 22–32)
Calcium: 8.2 mg/dL — ABNORMAL LOW (ref 8.9–10.3)
Chloride: 99 mmol/L (ref 98–111)
Creatinine, Ser: 1.16 mg/dL (ref 0.61–1.24)
GFR calc Af Amer: >60 mL/min (ref >60)
GFR calc non Af Amer: >60 mL/min (ref >60)
Glucose, Bld: 237 mg/dL — ABNORMAL HIGH (ref 70–99)
Potassium: 3.6 mmol/L (ref 3.5–5.1)
Sodium: 131 mmol/L — ABNORMAL LOW (ref 135–145)

## 2018-07-16 LAB — HEMOGLOBIN A1C
Hgb A1c MFr Bld: 15.6 % — ABNORMAL HIGH (ref 4.8–5.6)
Mean Plasma Glucose: 401.02 mg/dL

## 2018-07-16 LAB — CBC
HCT: 28.9 % — ABNORMAL LOW (ref 39.0–52.0)
Hemoglobin: 9.2 g/dL — ABNORMAL LOW (ref 13.0–17.0)
MCH: 26.1 pg (ref 26.0–34.0)
MCHC: 31.8 g/dL (ref 30.0–36.0)
MCV: 82.1 fL (ref 80.0–100.0)
Platelets: 240 10*3/uL (ref 150–400)
RBC: 3.52 MIL/uL — ABNORMAL LOW (ref 4.22–5.81)
RDW: 14 % (ref 11.5–15.5)
WBC: 7.8 10*3/uL (ref 4.0–10.5)
nRBC: 0 % (ref 0.0–0.2)

## 2018-07-16 LAB — MAGNESIUM: Magnesium: 1.8 mg/dL (ref 1.7–2.4)

## 2018-07-16 LAB — GLUCOSE, CAPILLARY
Glucose-Capillary: 221 mg/dL — ABNORMAL HIGH (ref 70–99)
Glucose-Capillary: 392 mg/dL — ABNORMAL HIGH (ref 70–99)

## 2018-07-16 MED ORDER — ONDANSETRON HCL 4 MG PO TABS: 4.0000 mg | ORAL_TABLET | Freq: Four times a day (QID) | ORAL | 0 refills | Status: DC | PRN

## 2018-07-16 MED ORDER — INSULIN ASPART 100 UNIT/ML ~~LOC~~ SOLN
3.0000 [IU] | Freq: Three times a day (TID) | SUBCUTANEOUS | Status: DC
  Administered 2018-07-16: 3 [IU] via SUBCUTANEOUS
  Filled 2018-07-16: qty 1

## 2018-07-16 MED ORDER — INSULIN DETEMIR 100 UNIT/ML ~~LOC~~ SOLN: 24.0000 [IU] | Freq: Every day | SUBCUTANEOUS | 0 refills | Status: DC

## 2018-07-16 MED ORDER — INSULIN DETEMIR 100 UNIT/ML ~~LOC~~ SOLN
24.0000 [IU] | Freq: Every day | SUBCUTANEOUS | Status: DC
  Filled 2018-07-16: qty 0.24

## 2018-07-16 MED ORDER — INSULIN ASPART 100 UNIT/ML ~~LOC~~ SOLN: 3.0000 [IU] | Freq: Three times a day (TID) | SUBCUTANEOUS | 0 refills | Status: DC

## 2018-07-16 NOTE — Progress Notes (Addendum)
Inpatient Diabetes Program Recommendations  AACE/ADA: New Consensus Statement on Inpatient Glycemic Control  Target Ranges:  Prepandial:   less than 140 mg/dL      Peak postprandial:   less than 180 mg/dL (1-2 hours)      Critically ill patients:  140 - 180 mg/dL  Results for Jesse Christian, Jesse Christian (MRN [id number]) as of 07/16/2018 07:20  Ref. Range 07/16/2018 03:08  Glucose Latest Ref Range: 70 - 99 mg/dL 382 (H)   Results for Jesse Christian, Jesse Christian (MRN [id number]) as of 07/16/2018 07:20  Ref. Range 07/15/2018 01:34 07/15/2018 07:40 07/15/2018 11:40 07/15/2018 16:41 07/15/2018 20:50  Glucose-Capillary Latest Ref Range: 70 - 99 mg/dL 419 (H) 379 (H) 024 (H) 284 (H) 257 (H)   Review of Glycemic Control  Diabetes history: DM2 Outpatient Diabetes medications: Levemir 20 units QHS, Metformin XR 1000 mg BID Current orders for Inpatient glycemic control: Levemir 20 units QHS, Novolog 0-15 units TID with meals, Novlog 0-5 units QHS  Inpatient Diabetes Program Recommendations:   Insulin - Basal: Please consider increasing Levemir to 24 units QHS.  Insulin - Meal Coverage: Please consider ordering Novolog 3 units TID with meals for meal coverage if patient eats at least 50% of meals.  HgbA1C: Last A1C 11.3% on 05/06/18 indicating an average glucose of 278 mg/dl. Recommend patient be referred to an Endocrinologist for assistance with improving DM control.   Oral DM medication: Patient reports he was restarted on Metformin in mid February and has been having ongoing issues with nausea for the past 1 month. May want to consider discontinuing Metformin as an outpatient due to patient's report of nausea over the past 1 month.  Thanks, Orlando Penner, RN, MSN, CDE Diabetes Coordinator Inpatient Diabetes Program [phone] (Team Pager from 8am to 5pm)

## 2018-07-16 NOTE — Progress Notes (Signed)
Discharge instructions explained to pt/ verbalized an understanding/ iv and tele removed/ RX given to pt/ transported off unit via wheelchair.  

## 2018-07-16 NOTE — Plan of Care (Signed)
  Problem: Clinical Measurements: Goal: Respiratory complications will improve Outcome: Progressing Note:  On room air   Problem: Activity: Goal: Risk for activity intolerance will decrease Outcome: Progressing Note:  Independent in room    Problem: Nutrition: Goal: Adequate nutrition will be maintained Outcome: Progressing   Problem: Coping: Goal: Level of anxiety will decrease Outcome: Progressing   Problem: Elimination: Goal: Will not experience complications related to urinary retention Outcome: Progressing   Problem: Pain Managment: Goal: General experience of comfort will improve Outcome: Progressing Note:  No complaints of pain this shift   Problem: Safety: Goal: Ability to remain free from injury will improve Outcome: Progressing   Problem: Education: Goal: Knowledge of General Education information will improve Description Including pain rating scale, medication(s)/side effects and non-pharmacologic comfort measures Outcome: Completed/Met

## 2018-07-16 NOTE — Discharge Summary (Signed)
[facility] [place] at [facility]   PATIENT NAME: Jesse Christian    MR#:  [id number]  DATE OF BIRTH:  February 21, 1971  DATE OF ADMISSION:  07/14/2018   ADMITTING PHYSICIAN: Ihor Austin, MD  DATE OF DISCHARGE: 07/16/2018  PRIMARY CARE PHYSICIAN: Dorcas Carrow, DO   ADMISSION DIAGNOSIS:  Pancreatitis [K85.90] Diabetic ketoacidosis without coma associated with type 2 diabetes mellitus (HCC) [E11.10] DISCHARGE DIAGNOSIS:  Active Problems:   Hyperglycemia without ketosis  SECONDARY DIAGNOSIS:   Past Medical History:  Diagnosis Date  . Diabetes mellitus without complication (HCC)   . Drug abuse (HCC)   . Hyperlipidemia   . Right arm fracture 2018   HOSPITAL COURSE:  Chief complaint; vomiting   History of presenting complaint; Jesse Christian  is a 48 y.o. male with a known history of diabetes mellitus insulin-dependent presented to the emergency room for nausea and vomiting.  Patient unable to eat because of the vomitings.   Patient was diagnosed with diabetes mellitus with hyperglycemia.  Started on insulin drip and admitted to the stepdown unit  Hospital course; 1.  Diabetes mellitus with hyperglycemia Patient was initially admitted to stepdown unit and started on insulin drip. Patient already weaned off insulin drip and being transferred out of stepdown unit.  Increased dose of Levemir insulin from 20 to 24 units SQ nightly.  Started on NovoLog 3 units with meal 3 times daily for better blood sugar control.  Clinically improved.  Metformin not resumed due to patient having lots of GI symptoms including nausea since patient was started on metformin previously.  Glycosylated hemoglobin level of 15.6.  Diabetic education was done prior to discharge.  2. Intractable nausea vomiting Improved with antiemetics.  Patient attributes symptoms to metformin which was previously started.  Metformin discontinued on discharge.  Tolerating diet well prior to discharge   3.  History of CAD status post bypass surgery Stable  4.  Chronic systolic CHF with ejection fraction of 20% Stable.   5.-Dehydration Already adequately hydrated with IV fluids  6.  Acute kidney injury Resolved with IV fluids.  7.Acute hyponatremia Secondary to hyperglycemia.    Improved with correction of hyperglycemia.    DISCHARGE CONDITIONS:  Stable CONSULTS OBTAINED:   DRUG ALLERGIES:   Allergies  Allergen Reactions  . Lyrica [Pregabalin] Other (See Comments)    Peripheral edema   DISCHARGE MEDICATIONS:   Allergies as of 07/16/2018      Reactions   Lyrica [pregabalin] Other (See Comments)   Peripheral edema      Medication List    STOP taking these medications   metFORMIN 500 MG 24 hr tablet Commonly known as:  Glucophage XR     TAKE these medications   aspirin 81 MG chewable tablet Chew 81 mg by mouth daily.   atorvastatin 40 MG tablet Commonly known as:  LIPITOR Take 1 tablet (40 mg total) by mouth daily at 6 PM.   clopidogrel 75 MG tablet Commonly known as:  PLAVIX Take 1 tablet (75 mg total) by mouth daily.   insulin aspart 100 UNIT/ML injection Commonly known as:  novoLOG Inject 3 Units into the skin 3 (three) times daily with meals for 30 days.   insulin detemir 100 UNIT/ML injection Commonly known as:  LEVEMIR Inject 0.24 mLs (24 Units total) into the skin at bedtime for 30 days. What changed:  how much to take   methadone 10 MG tablet Commonly known as:  DOLOPHINE Take 130 mg by mouth daily.  ondansetron 4 MG tablet Commonly known as:  ZOFRAN Take 1 tablet (4 mg total) by mouth every 6 (six) hours as needed for nausea.        DISCHARGE INSTRUCTIONS:   DIET:  Diabetic diet DISCHARGE CONDITION:  Stable ACTIVITY:  Activity as tolerated OXYGEN:  Home Oxygen: No.  Oxygen Delivery: room air DISCHARGE LOCATION:  home   If you experience worsening of your admission symptoms, develop shortness of breath, life  threatening emergency, suicidal or homicidal thoughts you must seek medical attention immediately by calling 911 or calling your MD immediately  if symptoms less severe.  You Must read complete instructions/literature along with all the possible adverse reactions/side effects for all the Medicines you take and that have been prescribed to you. Take any new Medicines after you have completely understood and accpet all the possible adverse reactions/side effects.   Please note  You were cared for by a hospitalist during your hospital stay. If you have any questions about your discharge medications or the care you received while you were in the hospital after you are discharged, you can call the unit and asked to speak with the hospitalist on call if the hospitalist that took care of you is not available. Once you are discharged, your primary care physician will handle any further medical issues. Please note that NO REFILLS for any discharge medications will be authorized once you are discharged, as it is imperative that you return to your primary care physician (or establish a relationship with a primary care physician if you do not have one) for your aftercare needs so that they can reassess your need for medications and monitor your lab values.    On the day of Discharge:  VITAL SIGNS:  Blood pressure (!) 143/91, pulse (!) 101, temperature 98.6 F (37 C), temperature source Oral, resp. rate 16, height 6' (1.829 m), weight 63.5 kg, SpO2 100 %. PHYSICAL EXAMINATION:  GENERAL:  48 y.o.-year-old patient lying in the bed with no acute distress.  EYES: Pupils equal, round, reactive to light and accommodation. No scleral icterus. Extraocular muscles intact.  HEENT: Head atraumatic, normocephalic. Oropharynx and nasopharynx clear.  NECK:  Supple, no jugular venous distention. No thyroid enlargement, no tenderness.  LUNGS: Normal breath sounds bilaterally, no wheezing, rales,rhonchi or crepitation. No use  of accessory muscles of respiration.  CARDIOVASCULAR: S1, S2 normal. No murmurs, rubs, or gallops.  ABDOMEN: Soft, non-tender, non-distended. Bowel sounds present. No organomegaly or mass.  EXTREMITIES: No pedal edema, cyanosis, or clubbing.  NEUROLOGIC: Cranial nerves II through XII are intact. Muscle strength 5/5 in all extremities. Sensation intact. Gait not checked.  PSYCHIATRIC: The patient is alert and oriented x 3.  SKIN: No obvious rash, lesion, or ulcer.  DATA REVIEW:   CBC Recent Labs  Lab 07/16/18 0308  WBC 7.8  HGB 9.2*  HCT 28.9*  PLT 240    Chemistries  Recent Labs  Lab 07/15/18 0446 07/16/18 0308  NA 137 131*  K 3.6 3.6  CL 100 99  CO2 28 25  GLUCOSE 145* 237*  BUN 22* 16  CREATININE 0.95 1.16  CALCIUM 8.9 8.2*  MG  --  1.8  AST 16  --   ALT 22  --   ALKPHOS 122  --   BILITOT 0.7  --      Microbiology Results  Results for orders placed or performed during the hospital encounter of 07/14/18  MRSA PCR Screening     Status: None   Collection  Time: 07/14/18  9:52 PM  Result Value Ref Range Status   MRSA by PCR NEGATIVE NEGATIVE Final    Comment:        The GeneXpert MRSA Assay (FDA approved for NASAL specimens only), is one component of a comprehensive MRSA colonization surveillance program. It is not intended to diagnose MRSA infection nor to guide or monitor treatment for MRSA infections. Performed at [facility], [address] [place], [place] [address]     RADIOLOGY:  No results found.   Management plans discussed with the patient, family and they are in agreement.  CODE STATUS: Full Code   TOTAL TIME TAKING CARE OF THIS PATIENT: 36 minutes.    Telissa Palmisano M.D on 07/16/2018 at 10:26 AM  Between 7am to 6pm - Pager - [phone]  After 6pm go to [email or web address] - Social research officer, governmentpassword EPAS [facility]  [facility] [place] [facility]  Office  [phone]  CC: Primary care physician; Dorcas CarrowJohnson, [name], DO   Note: This dictation  was prepared with Dragon dictation along with smaller phrase technology. Any transcriptional errors that result from this process are unintentional.

## 2018-07-17 ENCOUNTER — Telehealth: Payer: Self-pay | Admitting: Family Medicine

## 2018-07-17 NOTE — Telephone Encounter (Signed)
Caller name: Toni Amend  Relation to pt: Call back number: [phone] fax # [phone]  Pharmacy:  Reason for call:  Checking on the status of message below stating time is sensitive, please advise

## 2018-07-17 NOTE — Telephone Encounter (Signed)
Called and left a message for Toni Amend to return my call.

## 2018-07-17 NOTE — Telephone Encounter (Signed)
I got the medical order form for his LifeVest that cardiology is asking [place] to fill out. I need the settings that he is on to be able to fill out this form- can we please get them from his cardiologist. Their information should be in a previous TE where they asked [place] to fill this out. Thanks!

## 2018-07-17 NOTE — Telephone Encounter (Signed)
Spoke with Toni Amend, formed filled out and faxed.

## 2018-07-17 NOTE — Telephone Encounter (Signed)
I think the information was on a paper, not TE- [employer] contact information is [phone] and the contact person is Etheleen Mayhew. Her direct # is [phone]. I just need the information that was put in by the cardiologist to continue current regimen. Thanks!

## 2018-07-23 ENCOUNTER — Other Ambulatory Visit: Payer: Self-pay

## 2018-07-23 ENCOUNTER — Ambulatory Visit (INDEPENDENT_AMBULATORY_CARE_PROVIDER_SITE_OTHER): Payer: Self-pay | Admitting: Family Medicine

## 2018-07-23 ENCOUNTER — Encounter: Payer: Self-pay | Admitting: Family Medicine

## 2018-07-23 DIAGNOSIS — E871 Hypo-osmolality and hyponatremia: Secondary | ICD-10-CM

## 2018-07-23 DIAGNOSIS — I5021 Acute systolic (congestive) heart failure: Secondary | ICD-10-CM

## 2018-07-23 DIAGNOSIS — Z794 Long term (current) use of insulin: Secondary | ICD-10-CM

## 2018-07-23 DIAGNOSIS — N179 Acute kidney failure, unspecified: Secondary | ICD-10-CM

## 2018-07-23 DIAGNOSIS — R739 Hyperglycemia, unspecified: Secondary | ICD-10-CM

## 2018-07-23 DIAGNOSIS — E1365 Other specified diabetes mellitus with hyperglycemia: Secondary | ICD-10-CM

## 2018-07-23 MED ORDER — INSULIN DETEMIR 100 UNIT/ML ~~LOC~~ SOLN: 26.0000 [IU] | Freq: Every day | SUBCUTANEOUS | 3 refills | Status: DC

## 2018-07-23 MED ORDER — INSULIN ASPART 100 UNIT/ML ~~LOC~~ SOLN: 3.0000 [IU] | Freq: Three times a day (TID) | SUBCUTANEOUS | 3 refills | Status: DC

## 2018-07-23 NOTE — Patient Instructions (Signed)
Preventing Diabetic Ketoacidosis Diabetic ketoacidosis (DKA) is a life-threatening complication of diabetes (diabetes mellitus). It develops when there is not enough of a hormone called insulin in the body. If the body does not have enough insulin, it cannot divide (break down) sugar (glucose) into usable cells, so it breaks down fats instead. This leads to the production of acids (ketones), which can cause the blood to have too much acid in it (acidosis). DKA is a medical emergency that must be treated at the hospital. You may be more likely to develop DKA if you have type 1 diabetes and you take insulin. You can prevent DKA by working closely with your health care provider to manage your diabetes. What nutrition changes can be made?   Follow your meal plan, as directed by your health care provider or diet and nutrition specialist (dietitian).  Eat healthy meals at about the same time every day. Have healthy snacks between meals.  Avoid not eating for long periods of time. Do not skip meals, especially if you are ill.  Avoid regularly eating foods that contain a lot of sugar. Also avoid drinking alcohol. Sugary food and alcohol increase your risk of high blood glucose (hyperglycemia), which increases your risk for DKA.  Drink enough fluid to keep your urine pale yellow. Dehydration increases your risk for DKA. What actions can I take to lower my risk? To lower your risk for diabetic ketoacidosis, manage your diabetes as directed by your health care provider:  Take insulin and other diabetes medicines as directed.  Check your blood glucose every day, as often as directed.  Follow your sick day plan whenever you cannot eat or drink as usual. Make this plan in advance with your health care provider.  Check your urine for ketones as often as directed. ? During times when you are sick, check your ketones every 4-6 hours. ? If you develop symptoms of DKA, check your ketones right away.  If you  have ketones in your urine: ? Contact your health care provider right away. ? Do not exercise.  Know the symptoms of DKA so that you can get treatment as soon as possible.  Make sure that people at work, home, and school know how to check your blood glucose, in case you are not able to do it yourself.  Carry a medical alert card or wear medical alert jewelry that says that you have diabetes. Why are these changes important? DKA is a warning sign that your diabetes is not being well-controlled. You may need to work with your health care provider to adjust your diabetes management plan. DKA can lead to a serious medical emergency that can be life-threatening. Where to find support For more support with preventing DKA:  Talk with your health care provider.  Consider joining a support group. The American Diabetes Association has an online support community at: community.diabetes.org/home Where to find more information Learn more about preventing DKA from:  American Diabetes Association: www.diabetes.org  American Heart Association: www.heart.org Contact a health care provider if: You develop symptoms of DKA, such as:  Fatigue.  Weight loss.  Excessive thirst.  Light-headedness.  Fruity or sweet-smelling breath.  Excessive urination.  Vision changes.  Confusion or irritability.  Nausea.  Vomiting.  Rapid breathing.  Pain in the abdomen.  Feeling warm in your face (flushed). This may or may not include a reddish color coming to your face. If you develop any of these symptoms, do not wait to see if the symptoms will  go away. Get medical help right away. Call your local emergency services (911 in the [place] Do not drive yourself to the hospital. Summary  DKA may be a warning sign that your diabetes is not being well-controlled. You may need to work with your health care provider to adjust your diabetes management plan.  Preventing high blood glucose and dehydration  helps prevent DKA.  Check your urine for ketones as often as directed. You may need to check more often when your blood glucose level is high and when you are ill.  DKA is a medical emergency. Make sure you know the symptoms so that you can recognize and get treatment right away. This information is not intended to replace advice given to you by your health care provider. Make sure you discuss any questions you have with your health care provider. Document Released: 10/26/2016 Document Revised: 10/26/2016 Document Reviewed: 10/26/2016 Elsevier Interactive Patient Education  2019 [employer]  Diabetic Ketoacidosis Diabetic ketoacidosis is a serious complication of diabetes. This condition develops when there is not enough insulin in the body. Insulin is an hormone that regulates blood sugar levels in the body. Normally, insulin allows glucose to enter the cells in the body. The cells break down glucose for energy. Without enough insulin, the body cannot break down glucose, so it breaks down fats instead. This leads to high blood glucose levels in the body and the production of acids that are called ketones. Ketones are poisonous at high levels. If diabetic ketoacidosis is not treated, it can cause severe dehydration and can lead to a coma or death. What are the causes? This condition develops when a lack of insulin causes the body to break down fats instead of glucose. This may be triggered by:  Stress on the body. This stress is brought on by an illness.  Infection.  Medicines that raise blood glucose levels.  Not taking diabetes medicine.  New onset of type 1 diabetes mellitus. What are the signs or symptoms? Symptoms of this condition include:  Fatigue.  Weight loss.  Excessive thirst.  Light-headedness.  Fruity or sweet-smelling breath.  Excessive urination.  Vision changes.  Confusion or irritability.  Nausea.  Vomiting.  Rapid breathing.  Abdominal pain.   Feeling flushed. How is this diagnosed? This condition is diagnosed based on your medical history, a physical exam, and blood tests. You may also have a urine test to check for ketones. How is this treated? This condition may be treated with:  Fluid replacement. This may be done to correct dehydration.  Insulin injections. These may be given through the skin or through an IV tube.  Electrolyte replacement. Electrolytes are minerals in your blood. Electrolytes such as potassium and sodium may be given in pill form or through an IV tube.  Antibiotic medicines. These may be prescribed if your condition was caused by an infection. Diabetic ketoacidosis is a serious medical condition. You may need emergency treatment in the hospital to monitor your condition. Follow these instructions at home: Eating and drinking  Drink enough fluids to keep your urine clear or pale yellow.  If you are not able to eat, drink clear fluids in small amounts as you are able. Clear fluids include water, ice chips, fruit juice with water added (diluted), and low-calorie sports drinks. You may also have sugar-free jello or popsicles.  If you are able to eat, follow your usual diet and drink sugar-free liquids, such as water. Medicines  Take over-the-counter and prescription medicines only as  told by your health care provider.  Continue to take insulin and other diabetes medicines as told by your health care provider.  If you were prescribed an antibiotic, take it as told by your health care provider. Do not stop taking the antibiotic even if you start to feel better. General instructions   Check your urine for ketones when you are ill and as told by your health care provider. ? If your blood glucose is 240 mg/dL (42.5 mmol/L) or higher, check your urine ketones every 4-6 hours.  Check your blood glucose every day, as often as told by your health care provider. ? If your blood glucose is high, drink plenty of  fluids. This helps to flush out ketones. ? If your blood glucose is above your target for 2 tests in a row, contact your health care provider.  Carry a medical alert card or wear medical alert jewelry that says that you have diabetes.  Rest and exercise only as told by your health care provider. Do not exercise when your blood glucose is high and you have ketones in your urine.  If you get sick, call your health care provider and begin treatment quickly. Your body often needs extra insulin to fight an illness. Check your blood glucose every 4-6 hours when you are sick.  Keep all follow-up visits as told by your health care provider. This is important. Contact a health care provider if:  Your blood glucose level is higher than 240 mg/dL (95.6 mmol/L) for 2 days in a row.  You have moderate or large ketones in your urine.  You have a fever.  You cannot eat or drink without vomiting.  You have been vomiting for more than 2 hours.  You continue to have symptoms of diabetic ketoacidosis.  You develop new symptoms. Get help right away if:  Your blood glucose monitor reads "high" even when you are taking insulin.  You faint.  You have chest pain.  You have trouble breathing.  You have sudden trouble speaking or swallowing.  You have vomiting or diarrhea that gets worse after 3 hours.  You are unable to stay awake.  You have trouble thinking.  You are severely dehydrated. Symptoms of severe dehydration include: ? Extreme thirst. ? Dry mouth. ? Rapid breathing. These symptoms may represent a serious problem that is an emergency. Do not wait to see if the symptoms will go away. Get medical help right away. Call your local emergency services (911 in the [place] Do not drive yourself to the hospital. Summary  Diabetic ketoacidosis is a serious complication of diabetes. This condition develops when there is not enough insulin in the body.  This condition is diagnosed based on  your medical history, a physical exam, and blood tests. You may also have a urine test to check for ketones.  Diabetic ketoacidosis is a serious medical condition. You may need emergency treatment in the hospital to monitor your condition.  Contact your health care provider if your blood glucose is higher than 240 mg/dl for 2 days in a row or if you have moderate or large ketones in your urine. This information is not intended to replace advice given to you by your health care provider. Make sure you discuss any questions you have with your health care provider. Document Released: 03/24/2000 Document Revised: 05/01/2016 Document Reviewed: 05/01/2016 Elsevier Interactive Patient Education  2019 [employer].

## 2018-07-23 NOTE — Assessment & Plan Note (Signed)
Using his lifevest. Paperwork filled out to continue it until he gets into see cardiology- appointment has been pushed back until June due to the COVID-19 pandemic. Continue weights. Continue to watch salt. Call with any concerns.

## 2018-07-23 NOTE — Progress Notes (Signed)
There were no vitals taken for this visit.   Subjective:    Patient ID: Jesse Christian, male    DOB: January 27, 1971, 48 y.o.   MRN: [id number]  HPI: Jesse Christian is a 48 y.o. male  Chief Complaint  Patient presents with  . Hospitalization Follow-up    at [facility] 07/14/18 - 07/16/18   Transition of Care Hospital Follow up.   Hospital[facility]  [facility] D[facility]C Physician: Dr. Enid Baasjie D/C Date: 07/16/18  Records Requested: 07/16/18 Records Received: 07/16/18 Records Reviewed: 07/16/18  Diagnoses on Discharge: DKA  Date of interactive Contact within 48 hours of discharge: NOT DONE  Date of 7 day or 14 day face-to-face visit:  07/23/18  within 7 days  Outpatient Encounter Medications as of 07/23/2018  Medication Sig  . aspirin 81 MG chewable tablet Chew 81 mg by mouth daily.   Marland Kitchen. atorvastatin (LIPITOR) 40 MG tablet Take 1 tablet (40 mg total) by mouth daily at 6 PM.  . clopidogrel (PLAVIX) 75 MG tablet Take 1 tablet (75 mg total) by mouth daily.  . insulin aspart (NOVOLOG) 100 UNIT/ML injection Inject 3 Units into the skin 3 (three) times daily with meals for 30 days.  . insulin detemir (LEVEMIR) 100 UNIT/ML injection Inject 0.26 mLs (26 Units total) into the skin at bedtime for 30 days.  . methadone (DOLOPHINE) 10 MG tablet Take 130 mg by mouth daily.   . ondansetron (ZOFRAN) 4 MG tablet Take 1 tablet (4 mg total) by mouth every 6 (six) hours as needed for nausea.  . [DISCONTINUED] insulin aspart (NOVOLOG) 100 UNIT/ML injection Inject 3 Units into the skin 3 (three) times daily with meals for 30 days.  . [DISCONTINUED] insulin detemir (LEVEMIR) 100 UNIT/ML injection Inject 0.24 mLs (24 Units total) into the skin at bedtime for 30 days.   No facility-administered encounter medications on file as of 07/23/2018.   Per Hospitalist: "  HOSPITAL COURSE:  Chief complaint; vomiting  History of presenting complaint; Jesse Christian a47 y.o.malewith a known history of diabetes mellitus  insulin-dependent presented to the emergency room for nausea and vomiting.Patient unable to eat because of the vomitings.  Patient was diagnosed with diabetes mellitus with hyperglycemia.  Started on insulin drip and admitted to the stepdown unit  Hospital course; 1.Diabetes mellitus with hyperglycemia Patient was initially admitted to stepdown unit and started on insulin drip. Patient already weaned off insulin drip and being transferred out of stepdown unit.  Increased dose of Levemir insulin from 20 to 24 units SQ nightly.  Started on NovoLog 3 units with meal 3 times daily for better blood sugar control.  Clinically improved.  Metformin not resumed due to patient having lots of GI symptoms including nausea since patient was started on metformin previously.  Glycosylated hemoglobin level of 15.6.  Diabetic education was done prior to discharge.  2.Intractable nausea vomiting Improved with antiemetics.  Patient attributes symptoms to metformin which was previously started.  Metformin discontinued on discharge.  Tolerating diet well prior to discharge  3.History of CAD status post bypass surgery Stable  4.Chronic systolic CHF with ejection fraction of 20% Stable.   5.-Dehydration Already adequately hydrated with IV fluids  6.Acute kidney injury Resolved with IV fluids.  7.Acute hyponatremia Secondary to hyperglycemia.   Improved with correction of hyperglycemia."   Diagnostic Tests Reviewed:  CLINICAL DATA:  Pancreatitis  EXAM: ULTRASOUND ABDOMEN LIMITED RIGHT UPPER QUADRANT  COMPARISON:  CT abdomen 07/03/2017  FINDINGS: Gallbladder:  No gallstones or wall thickening visualized. No sonographic  Murphy sign noted by sonographer.  Common bile duct:  Diameter: 4.2 mm  Liver:  No focal lesion identified. Within normal limits in parenchymal echogenicity. Portal vein is patent on color Doppler imaging with normal direction of blood flow towards  the liver.  IMPRESSION: 1. Normal right upper quadrant ultrasound.  Disposition: Home   Consults: Critical Care  Discharge Instructions:  Follow up here  Disease/illness Education:  Given in writing  Home Health/Community Services Discussions/Referrals: To endocrine- made today  Establishment or re-establishment of referral orders for community resources:  N/A  Discussion with other health care providers: N/A  Assessment and Support of treatment regimen adherence: Fair  Appointments Coordinated with: Patient  Education for self-management, independent living, and ADLs: Given today in writing  Leigh notes that he is doing OK since coming out of the hospital. He feels like he is pretty much back to normal. He notes that his sugars are running about 240 in the AM when he first wakes up, will continue to stay high. Not dipping low very often. Has had 1 sugar of 44, but that was after taking his novolog. He continues to use a sliding scale during the day. He denies any N/V. States that it has resolved off the metformin. He is tired, but back to work. He is otherwise feeling pretty well. Continues to wear his lifevest. No other concerns or complaints at this time.   Relevant past medical, surgical, family and social history reviewed and updated as indicated. Interim medical history since our last visit reviewed. Allergies and medications reviewed and updated.  Review of Systems  Constitutional: Positive for fatigue. Negative for activity change, appetite change, chills, diaphoresis, fever and unexpected weight change.  HENT: Negative.   Eyes: Negative.   Respiratory: Negative.   Cardiovascular: Negative.   Gastrointestinal: Negative.   Musculoskeletal: Negative.   Neurological: Negative.   Hematological: Negative.   Psychiatric/Behavioral: Negative.     Per HPI unless specifically indicated above     Objective:    There were no vitals taken for this visit.  Wt Readings  from Last 3 Encounters:  07/14/18 140 lb (63.5 kg)  07/01/18 149 lb (67.6 kg)  05/12/18 152 lb 9.6 oz (69.2 kg)    Physical Exam Vitals signs and nursing note reviewed.  Pulmonary:     Effort: Pulmonary effort is normal. No respiratory distress.     Comments: Speaking in full sentences Neurological:     Mental Status: He is alert.  Psychiatric:        Mood and Affect: Mood normal.        Behavior: Behavior normal.        Thought Content: Thought content normal.        Judgment: Judgment normal.     Results for orders placed or performed during the hospital encounter of 07/14/18  MRSA PCR Screening  Result Value Ref Range   MRSA by PCR NEGATIVE NEGATIVE  Lipase, blood  Result Value Ref Range   Lipase 144 (H) 11 - 51 U/L  Comprehensive metabolic panel  Result Value Ref Range   Sodium 126 (L) 135 - 145 mmol/L   Potassium 4.4 3.5 - 5.1 mmol/L   Chloride 86 (L) 98 - 111 mmol/L   CO2 24 22 - 32 mmol/L   Glucose, Bld 1,063 (HH) 70 - 99 mg/dL   BUN 28 (H) 6 - 20 mg/dL   Creatinine, Ser 1.61 (H) 0.61 - 1.24 mg/dL   Calcium 9.4 8.9 - 09.6 mg/dL  Total Protein 8.1 6.5 - 8.1 g/dL   Albumin 4.2 3.5 - 5.0 g/dL   AST 19 15 - 41 U/L   ALT 32 0 - 44 U/L   Alkaline Phosphatase 196 (H) 38 - 126 U/L   Total Bilirubin 1.3 (H) 0.3 - 1.2 mg/dL   GFR calc non Af Amer 59 (L) >60 mL/min   GFR calc Af Amer >60 >60 mL/min   Anion gap 16 (H) 5 - 15  CBC  Result Value Ref Range   WBC 7.8 4.0 - 10.5 K/uL   RBC 4.39 4.22 - 5.81 MIL/uL   Hemoglobin 11.5 (L) 13.0 - 17.0 g/dL   HCT 09.8 (L) 11.9 - 14.7 %   MCV 83.6 80.0 - 100.0 fL   MCH 26.2 26.0 - 34.0 pg   MCHC 31.3 30.0 - 36.0 g/dL   RDW 82.9 56.2 - 13.0 %   Platelets 308 150 - 400 K/uL   nRBC 0.0 0.0 - 0.2 %  Urinalysis, Complete w Microscopic  Result Value Ref Range   Color, Urine STRAW (A) YELLOW   APPearance CLEAR (A) CLEAR   Specific Gravity, Urine 1.026 1.005 - 1.030   pH 6.0 5.0 - 8.0   Glucose, UA >=500 (A) NEGATIVE mg/dL    Hgb urine dipstick SMALL (A) NEGATIVE   Bilirubin Urine NEGATIVE NEGATIVE   Ketones, ur 5 (A) NEGATIVE mg/dL   Protein, ur 865 (A) NEGATIVE mg/dL   Nitrite NEGATIVE NEGATIVE   Leukocytes,Ua NEGATIVE NEGATIVE   RBC / HPF 0-5 0 - 5 RBC/hpf   WBC, UA 0-5 0 - 5 WBC/hpf   Bacteria, UA NONE SEEN NONE SEEN   Squamous Epithelial / LPF NONE SEEN 0 - 5  Glucose, capillary  Result Value Ref Range   Glucose-Capillary >600 (HH) 70 - 99 mg/dL  Glucose, capillary  Result Value Ref Range   Glucose-Capillary >600 (HH) 70 - 99 mg/dL  Glucose, capillary  Result Value Ref Range   Glucose-Capillary >600 (HH) 70 - 99 mg/dL   Comment 1 Document in Chart   Basic metabolic panel  Result Value Ref Range   Sodium 134 (L) 135 - 145 mmol/L   Potassium 3.4 (L) 3.5 - 5.1 mmol/L   Chloride 98 98 - 111 mmol/L   CO2 27 22 - 32 mmol/L   Glucose, Bld 550 (HH) 70 - 99 mg/dL   BUN 25 (H) 6 - 20 mg/dL   Creatinine, Ser 7.84 0.61 - 1.24 mg/dL   Calcium 8.5 (L) 8.9 - 10.3 mg/dL   GFR calc non Af Amer >60 >60 mL/min   GFR calc Af Amer >60 >60 mL/min   Anion gap 9 5 - 15  Beta-hydroxybutyric acid  Result Value Ref Range   Beta-Hydroxybutyric Acid 0.22 0.05 - 0.27 mmol/L  Glucose, capillary  Result Value Ref Range   Glucose-Capillary 490 (H) 70 - 99 mg/dL  CBC  Result Value Ref Range   WBC 9.3 4.0 - 10.5 K/uL   RBC 3.95 (L) 4.22 - 5.81 MIL/uL   Hemoglobin 10.5 (L) 13.0 - 17.0 g/dL   HCT 69.6 (L) 29.5 - 28.4 %   MCV 83.0 80.0 - 100.0 fL   MCH 26.6 26.0 - 34.0 pg   MCHC 32.0 30.0 - 36.0 g/dL   RDW 13.2 44.0 - 10.2 %   Platelets 279 150 - 400 K/uL   nRBC 0.0 0.0 - 0.2 %  Comprehensive metabolic panel  Result Value Ref Range   Sodium 137 135 -  145 mmol/L   Potassium 3.6 3.5 - 5.1 mmol/L   Chloride 100 98 - 111 mmol/L   CO2 28 22 - 32 mmol/L   Glucose, Bld 145 (H) 70 - 99 mg/dL   BUN 22 (H) 6 - 20 mg/dL   Creatinine, Ser 8.34 0.61 - 1.24 mg/dL   Calcium 8.9 8.9 - 19.6 mg/dL   Total Protein 7.0 6.5 - 8.1  g/dL   Albumin 3.6 3.5 - 5.0 g/dL   AST 16 15 - 41 U/L   ALT 22 0 - 44 U/L   Alkaline Phosphatase 122 38 - 126 U/L   Total Bilirubin 0.7 0.3 - 1.2 mg/dL   GFR calc non Af Amer >60 >60 mL/min   GFR calc Af Amer >60 >60 mL/min   Anion gap 9 5 - 15  Lipase, blood  Result Value Ref Range   Lipase 52 (H) 11 - 51 U/L  Amylase  Result Value Ref Range   Amylase 89 28 - 100 U/L  Amylase  Result Value Ref Range   Amylase 87 28 - 100 U/L  Glucose, capillary  Result Value Ref Range   Glucose-Capillary 407 (H) 70 - 99 mg/dL  Osmolality  Result Value Ref Range   Osmolality 305 (H) 275 - 295 mOsm/kg  Glucose, capillary  Result Value Ref Range   Glucose-Capillary 344 (H) 70 - 99 mg/dL   Comment 1 Document in Chart   Glucose, capillary  Result Value Ref Range   Glucose-Capillary 215 (H) 70 - 99 mg/dL  Glucose, capillary  Result Value Ref Range   Glucose-Capillary 227 (H) 70 - 99 mg/dL   Comment 1 Document in Chart   Glucose, capillary  Result Value Ref Range   Glucose-Capillary 172 (H) 70 - 99 mg/dL   Comment 1 Document in Chart   Glucose, capillary  Result Value Ref Range   Glucose-Capillary 119 (H) 70 - 99 mg/dL  Glucose, capillary  Result Value Ref Range   Glucose-Capillary 192 (H) 70 - 99 mg/dL  Glucose, capillary  Result Value Ref Range   Glucose-Capillary 284 (H) 70 - 99 mg/dL  Basic metabolic panel  Result Value Ref Range   Sodium 131 (L) 135 - 145 mmol/L   Potassium 3.6 3.5 - 5.1 mmol/L   Chloride 99 98 - 111 mmol/L   CO2 25 22 - 32 mmol/L   Glucose, Bld 237 (H) 70 - 99 mg/dL   BUN 16 6 - 20 mg/dL   Creatinine, Ser 2.22 0.61 - 1.24 mg/dL   Calcium 8.2 (L) 8.9 - 10.3 mg/dL   GFR calc non Af Amer >60 >60 mL/min   GFR calc Af Amer >60 >60 mL/min   Anion gap 7 5 - 15  CBC  Result Value Ref Range   WBC 7.8 4.0 - 10.5 K/uL   RBC 3.52 (L) 4.22 - 5.81 MIL/uL   Hemoglobin 9.2 (L) 13.0 - 17.0 g/dL   HCT 97.9 (L) 89.2 - 11.9 %   MCV 82.1 80.0 - 100.0 fL   MCH 26.1 26.0  - 34.0 pg   MCHC 31.8 30.0 - 36.0 g/dL   RDW 41.7 40.8 - 14.4 %   Platelets 240 150 - 400 K/uL   nRBC 0.0 0.0 - 0.2 %  Magnesium  Result Value Ref Range   Magnesium 1.8 1.7 - 2.4 mg/dL  Hemoglobin Y1E  Result Value Ref Range   Hgb A1c MFr Bld 15.6 (H) 4.8 - 5.6 %   Mean Plasma Glucose  401.02 mg/dL  Glucose, capillary  Result Value Ref Range   Glucose-Capillary 257 (H) 70 - 99 mg/dL   Comment 1 Notify RN    Comment 2 Document in Chart   Glucose, capillary  Result Value Ref Range   Glucose-Capillary 221 (H) 70 - 99 mg/dL  Glucose, capillary  Result Value Ref Range   Glucose-Capillary 392 (H) 70 - 99 mg/dL      Assessment & Plan:   Problem List Items Addressed This Visit      Cardiovascular and Mediastinum   Acute systolic heart failure (HCC)    Using his lifevest. Paperwork filled out to continue it until he gets into see cardiology- appointment has been pushed back until June due to the COVID-19 pandemic. Continue weights. Continue to watch salt. Call with any concerns.         Endocrine   Diabetes mellitus with hyperglycemia, with long-term current use of insulin (HCC) - Primary    Not under good control with last A1c 15.8. Will increase levemir to 26 units qHS. Remain on sliding scale. Recheck 1 week to see where sugars are. Call with any concerns. Will check CMP.       Relevant Medications   insulin detemir (LEVEMIR) 100 UNIT/ML injection   insulin aspart (NOVOLOG) 100 UNIT/ML injection   Other Relevant Orders   Ambulatory referral to Endocrinology   Comprehensive metabolic panel     Other   Hyperglycemia without ketosis    See discussion under diabetes. Call with any concerns.        Other Visit Diagnoses    AKI (acute kidney injury) (HCC)       Rechecking labs shortly. Await results. Call with any concerns.    Relevant Orders   Comprehensive metabolic panel   Hyponatremia       Rechecking labs shortly. Await results. Call with any concerns.    Relevant  Orders   Comprehensive metabolic panel       Follow up plan: Return in about 1 week (around 07/30/2018) for follow up sugars.    . This visit was completed via telephone due to the restrictions of the COVID-19 pandemic. All issues as above were discussed and addressed but no physical exam was performed. If it was felt that the patient should be evaluated in the office, they were directed there. The patient verbally consented to this visit. Patient was unable to complete an audio/visual visit due to Lack of equipment. Due to the catastrophic nature of the COVID-19 pandemic, this visit was done through audio contact only. . Location of the patient: home . Location of the provider: work . Those involved with this call:  . Provider: Olevia Perches, DO . CMA: Wilhemena Durie, CMA . Front Desk/Registration: Adela Ports  . Time spent on call: 35 minutes on the phone discussing health concerns. 55 minutes total spent in review of patient's record and preparation of their chart.

## 2018-07-23 NOTE — Assessment & Plan Note (Signed)
Not under good control with last A1c 15.8. Will increase levemir to 26 units qHS. Remain on sliding scale. Recheck 1 week to see where sugars are. Call with any concerns. Will check CMP.

## 2018-07-23 NOTE — Assessment & Plan Note (Signed)
See discussion under diabetes. Call with any concerns.

## 2018-07-29 ENCOUNTER — Encounter: Payer: Self-pay | Admitting: *Deleted

## 2018-07-30 ENCOUNTER — Ambulatory Visit: Payer: Self-pay | Admitting: Family Medicine

## 2018-07-30 ENCOUNTER — Other Ambulatory Visit: Payer: Self-pay

## 2018-08-05 ENCOUNTER — Telehealth: Payer: Self-pay | Admitting: Family Medicine

## 2018-08-05 NOTE — Telephone Encounter (Signed)
Paperwork reviewed- it is accomodation, which we as an office do not do. I'll be happy to give him FMLA and short term restrictions until he gets in to see his cardiologist, but accommodations must come from the specialist.

## 2018-08-05 NOTE — Telephone Encounter (Signed)
Please have him drop off forms and I'll look at them to see if I can fill them out. He'll need an appointment/virtual/phone visit for paperwork

## 2018-08-05 NOTE — Telephone Encounter (Signed)
Copied from CRM [id number]. Topic: General - Other >> Aug 05, 2018  9:42 AM Wyonia Hough E wrote: Reason for CRM: Pt has paper work needed to be filled out for work. Pt has had surgery and needs to have job duties changed to suit his current state. / PT would like to drop off paperwork and speak with Dr. Laural Benes before its filled out/ please advise >> Aug 05, 2018  9:53 AM Gabriel Cirri wrote: Please advise    Thank you

## 2018-08-07 ENCOUNTER — Telehealth: Payer: Self-pay | Admitting: Family Medicine

## 2018-08-07 LAB — HM DIABETES EYE EXAM

## 2018-08-07 NOTE — Telephone Encounter (Signed)
Pt has 2 appt's scheduled for the same thing. Called pt to see which one would be better, no answer left voicemail.

## 2018-08-09 ENCOUNTER — Ambulatory Visit: Payer: Self-pay | Admitting: Family Medicine

## 2018-08-12 DIAGNOSIS — F1120 Opioid dependence, uncomplicated: Secondary | ICD-10-CM | POA: Insufficient documentation

## 2018-08-13 ENCOUNTER — Telehealth: Payer: Self-pay

## 2018-08-13 NOTE — Telephone Encounter (Signed)
Patient called stating we called him this morning. Spoke with staff and nobody called him this morning. Patient did sound off and not himself. Stated that Dr. Dossie Arbour told him to go to the ER if he had any bowel or urinary issues. I asked patient was he going to the ER and patient said no. Patient didn't say anything for a few seconds. I asked him if he needed me to do anything and he said he didn't know, we were the ones that called him. Patient stated he received a voicemail and I asked patient did they leave a name. Patient said he didn't know and patient then tried to pull up his voicemail while talking to me on the phone. Patient said that he'd have to call back after he listened to his voicemail and got off the phone. Spoke with Dr. Laural Benes, she stated to ask if he was feeling okay and if his sugars were okay. Dr. Laural Benes also mentioned that it could've very well been an emmi call.

## 2018-08-20 ENCOUNTER — Emergency Department: Payer: No Typology Code available for payment source

## 2018-08-20 ENCOUNTER — Observation Stay
Admission: EM | Admit: 2018-08-20 | Discharge: 2018-08-22 | Disposition: A | Discharge status: Home / Self Care | Payer: No Typology Code available for payment source | Attending: Internal Medicine | Admitting: Internal Medicine

## 2018-08-20 ENCOUNTER — Other Ambulatory Visit: Payer: Self-pay

## 2018-08-20 DIAGNOSIS — F1911 Other psychoactive substance abuse, in remission: Secondary | ICD-10-CM | POA: Diagnosis not present

## 2018-08-20 DIAGNOSIS — Z8249 Family history of ischemic heart disease and other diseases of the circulatory system: Secondary | ICD-10-CM | POA: Diagnosis not present

## 2018-08-20 DIAGNOSIS — Z794 Long term (current) use of insulin: Secondary | ICD-10-CM | POA: Diagnosis not present

## 2018-08-20 DIAGNOSIS — Z1159 Encounter for screening for other viral diseases: Secondary | ICD-10-CM | POA: Diagnosis not present

## 2018-08-20 DIAGNOSIS — I5043 Acute on chronic combined systolic (congestive) and diastolic (congestive) heart failure: Secondary | ICD-10-CM | POA: Insufficient documentation

## 2018-08-20 DIAGNOSIS — Z951 Presence of aortocoronary bypass graft: Secondary | ICD-10-CM | POA: Insufficient documentation

## 2018-08-20 DIAGNOSIS — R079 Chest pain, unspecified: Secondary | ICD-10-CM | POA: Diagnosis present

## 2018-08-20 DIAGNOSIS — Z79899 Other long term (current) drug therapy: Secondary | ICD-10-CM | POA: Diagnosis not present

## 2018-08-20 DIAGNOSIS — G4700 Insomnia, unspecified: Secondary | ICD-10-CM | POA: Insufficient documentation

## 2018-08-20 DIAGNOSIS — B182 Chronic viral hepatitis C: Secondary | ICD-10-CM | POA: Diagnosis not present

## 2018-08-20 DIAGNOSIS — E1165 Type 2 diabetes mellitus with hyperglycemia: Principal | ICD-10-CM | POA: Insufficient documentation

## 2018-08-20 DIAGNOSIS — Z7982 Long term (current) use of aspirin: Secondary | ICD-10-CM | POA: Diagnosis not present

## 2018-08-20 DIAGNOSIS — Z87891 Personal history of nicotine dependence: Secondary | ICD-10-CM | POA: Insufficient documentation

## 2018-08-20 DIAGNOSIS — E785 Hyperlipidemia, unspecified: Secondary | ICD-10-CM | POA: Insufficient documentation

## 2018-08-20 DIAGNOSIS — I509 Heart failure, unspecified: Secondary | ICD-10-CM

## 2018-08-20 DIAGNOSIS — R351 Nocturia: Secondary | ICD-10-CM | POA: Insufficient documentation

## 2018-08-20 DIAGNOSIS — R739 Hyperglycemia, unspecified: Secondary | ICD-10-CM

## 2018-08-20 DIAGNOSIS — N401 Enlarged prostate with lower urinary tract symptoms: Secondary | ICD-10-CM | POA: Insufficient documentation

## 2018-08-20 DIAGNOSIS — Z7902 Long term (current) use of antithrombotics/antiplatelets: Secondary | ICD-10-CM | POA: Diagnosis not present

## 2018-08-20 DIAGNOSIS — I5021 Acute systolic (congestive) heart failure: Secondary | ICD-10-CM

## 2018-08-20 LAB — BASIC METABOLIC PANEL
Anion gap: 10 (ref 5–15)
BUN: 26 mg/dL — ABNORMAL HIGH (ref 6–20)
CO2: 28 mmol/L (ref 22–32)
Calcium: 8.6 mg/dL — ABNORMAL LOW (ref 8.9–10.3)
Chloride: 93 mmol/L — ABNORMAL LOW (ref 98–111)
Creatinine, Ser: 1.3 mg/dL — ABNORMAL HIGH (ref 0.61–1.24)
GFR calc Af Amer: >60 mL/min (ref >60)
GFR calc non Af Amer: >60 mL/min (ref >60)
Glucose, Bld: 496 mg/dL — ABNORMAL HIGH (ref 70–99)
Potassium: 4.5 mmol/L (ref 3.5–5.1)
Sodium: 131 mmol/L — ABNORMAL LOW (ref 135–145)

## 2018-08-20 LAB — URINALYSIS, COMPLETE (UACMP) WITH MICROSCOPIC
Bacteria, UA: NONE SEEN
Bilirubin Urine: NEGATIVE
Glucose, UA: >=500 mg/dL — AB
Ketones, ur: NEGATIVE mg/dL
Leukocytes,Ua: NEGATIVE
Nitrite: NEGATIVE
Protein, ur: 100 mg/dL — AB
Specific Gravity, Urine: 1.012 (ref 1.005–1.030)
Squamous Epithelial / HPF: NONE SEEN (ref 0–5)
pH: 7 (ref 5.0–8.0)

## 2018-08-20 LAB — CBC
HCT: 30.5 % — ABNORMAL LOW (ref 39.0–52.0)
Hemoglobin: 9.8 g/dL — ABNORMAL LOW (ref 13.0–17.0)
MCH: 27.1 pg (ref 26.0–34.0)
MCHC: 32.1 g/dL (ref 30.0–36.0)
MCV: 84.3 fL (ref 80.0–100.0)
Platelets: 302 10*3/uL (ref 150–400)
RBC: 3.62 MIL/uL — ABNORMAL LOW (ref 4.22–5.81)
RDW: 14.8 % (ref 11.5–15.5)
WBC: 6.8 10*3/uL (ref 4.0–10.5)
nRBC: 0 % (ref 0.0–0.2)

## 2018-08-20 LAB — SARS CORONAVIRUS 2 BY RT PCR (HOSPITAL ORDER, PERFORMED IN ~~LOC~~ HOSPITAL LAB): SARS Coronavirus 2: NEGATIVE

## 2018-08-20 LAB — GLUCOSE, CAPILLARY
Glucose-Capillary: 502 mg/dL (ref 70–99)
Glucose-Capillary: 374 mg/dL — ABNORMAL HIGH (ref 70–99)

## 2018-08-20 LAB — TROPONIN I
Troponin I: <0.03 ng/mL (ref <0.03)
Troponin I: <0.03 ng/mL (ref <0.03)

## 2018-08-20 LAB — BRAIN NATRIURETIC PEPTIDE: B Natriuretic Peptide: 1168 pg/mL — ABNORMAL HIGH (ref 0.0–100.0)

## 2018-08-20 MED ORDER — INSULIN ASPART 100 UNIT/ML ~~LOC~~ SOLN
10.0000 [IU] | Freq: Once | SUBCUTANEOUS | Status: AC
  Administered 2018-08-20: 10 [IU] via INTRAVENOUS
  Filled 2018-08-20: qty 1

## 2018-08-20 MED ORDER — SODIUM CHLORIDE 0.9 % IV BOLUS: 1000.0000 mL | Freq: Once | INTRAVENOUS | Status: DC

## 2018-08-20 MED ORDER — ALUM & MAG HYDROXIDE-SIMETH 200-200-20 MG/5ML PO SUSP
30.0000 mL | Freq: Once | ORAL | Status: AC
  Administered 2018-08-20: 30 mL via ORAL
  Filled 2018-08-20: qty 30

## 2018-08-20 MED ORDER — LIDOCAINE VISCOUS HCL 2 % MT SOLN
15.0000 mL | Freq: Once | OROMUCOSAL | Status: AC
  Administered 2018-08-20: 15 mL via ORAL
  Filled 2018-08-20: qty 15

## 2018-08-20 NOTE — ED Triage Notes (Signed)
Pt c/o intermittent substernal chest pain/tighness lasting about , states he has been experiencing bulching with N/v intermittently also. Pt is in NAD at present.

## 2018-08-20 NOTE — ED Notes (Signed)
Pt given water at this time, okay per NPcari beth

## 2018-08-20 NOTE — ED Notes (Addendum)
Pt has external defib vest. He reports it is because of his low EF but as far as pt knows has not had any abnormal rhythms to require vest.  He reports it shocks him all the time for no reason.  Permanent defib to be placed next Friday. Hx bypass.

## 2018-08-20 NOTE — ED Provider Notes (Signed)
[facility] Emergency Department Provider Note  ____________________________________________   None    (approximate)  I have reviewed the triage vital signs and the nursing notes.   HISTORY  Chief Complaint Chest Pain   HPI Jesse Christian is a 48 y.o. male who presents to the emergency department treatment and evaluation of chest pain that started about 10 minutes prior to arrival.  He has a complicated cardiac history for which he is now wearing a Zoll Lifevest with plan on internal defibrillator placement scheduled. He is also a diabetic with history of DKA. Chest pain feels like "squeezing inside." He has taken his daily medications, but no other alleviating measures attempted.  Past Medical History:  Diagnosis Date  . Diabetes mellitus without complication (HCC)   . Drug abuse (HCC)   . Hyperlipidemia   . Right arm fracture 2018    Patient Active Problem List   Diagnosis Date Noted  . Acute CHF (HCC) 08/21/2018  . Chest pain 08/21/2018  . Acute on chronic combined systolic and diastolic CHF (congestive heart failure) (HCC) 08/21/2018  . Hyperglycemia without ketosis 07/14/2018  . Malnutrition of moderate degree 05/07/2018  . Acute systolic heart failure (HCC) 05/06/2018  . Foot infection 09/23/2017  . Benign prostatic hyperplasia with nocturia 06/26/2016  . Anxiety disorder due to medical condition 06/22/2015  . Insomnia 06/22/2015  . Hep C w/o coma, chronic (HCC) 06/10/2015  . Benign hypertensive renal disease 06/08/2015  . History of drug abuse in remission (HCC) 06/08/2015  . Diabetes mellitus with hyperglycemia, with long-term current use of insulin (HCC)   . Hyperlipidemia     Past Surgical History:  Procedure Laterality Date  . CARDIAC SURGERY    . CARDIAC SURGERY  05/2018   double bypass  . LEFT HEART CATH AND CORONARY ANGIOGRAPHY Right 05/06/2018   Procedure: LEFT HEART CATH AND CORONARY ANGIOGRAPHY;  Surgeon: Laurier Nancy,  MD;  Location: [facility] INVASIVE CV LAB;  Service: Cardiovascular;  Laterality: Right;    Prior to Admission medications   Medication Sig Start Date End Date Taking? Authorizing Provider  aspirin 81 MG chewable tablet Chew 81 mg by mouth daily.    Yes [provider]  atorvastatin (LIPITOR) 80 MG tablet Take 80 mg by mouth daily. 07/30/18  Yes [provider]  clopidogrel (PLAVIX) 75 MG tablet Take 1 tablet (75 mg total) by mouth daily. 07/01/18  Yes [name], [name], DO  furosemide (LASIX) 40 MG tablet Take 1 tablet by mouth 2 (two) times a day. 07/30/18  Yes [provider]  insulin aspart (NOVOLOG) 100 UNIT/ML injection Inject 3 Units into the skin 3 (three) times daily with meals for 30 days. 07/23/18 08/22/18 Yes [name], [name], DO  insulin detemir (LEVEMIR) 100 UNIT/ML injection Inject 0.26 mLs (26 Units total) into the skin at bedtime for 30 days. 07/23/18 08/22/18 Yes [name], [name], DO  losartan (COZAAR) 25 MG tablet Take 25 mg by mouth daily. 07/30/18  Yes [provider]  magnesium oxide (MAG-OX) 400 MG tablet Take 400 mg by mouth daily.   Yes [provider]  methadone (DOLOPHINE) 10 MG tablet Take 130 mg by mouth daily.    Yes [provider]  metoprolol succinate (TOPROL-XL) 25 MG 24 hr tablet Take 25 mg by mouth daily. 07/30/18  Yes [provider]  ondansetron (ZOFRAN) 4 MG tablet Take 1 tablet (4 mg total) by mouth every 6 (six) hours as needed for nausea. 07/16/18  Yes Ojie, Jude, MD    Allergies Lyrica [pregabalin] and Metformin and related  Family History  Problem Relation Age of Onset  . Mental illness Mother   . Heart attack Father   . Heart disease Father   . Mental illness Sister   . Cancer Maternal Aunt        Lung cancer  . Prostate cancer Neg Hx   . Bladder Cancer Neg Hx   . Kidney cancer Neg Hx     Social History Social History   Tobacco Use  . Smoking status: Former Smoker    Packs/day: 0.50     Types: Cigarettes  . Smokeless tobacco: Never Used  Substance Use Topics  . Alcohol use: No  . Drug use: No    Review of Systems  Constitutional: No fever/chills. Eyes: No visual changes. ENT: No sore throat. Cardiovascular: Positive for chest pressure. Negative for pleuritic pain. Negative for palpitations. Negative for leg pain. Respiratory: Negative shortness of breath. Gastrointestinal: Negative for abdominal pain. No nausea, no vomiting.  No diarrhea.  No constipation. Positive for belching with sulfa taste. Genitourinary: Negative for dysuria. Musculoskeletal: Negative for back pain.  Skin: Negative for rash, lesion, wound. Neurological: Negative for headaches, focal weakness or numbness. ____________________________________________   PHYSICAL EXAM:  VITAL SIGNS: ED Triage Vitals  Enc Vitals Group     BP 08/20/18 1754 (!) 133/93     Pulse Rate 08/20/18 1754 77     Resp 08/20/18 1754 16     Temp 08/20/18 1754 98.3 F (36.8 C)     Temp Source 08/20/18 1754 Oral     SpO2 08/20/18 1754 99 %     Weight 08/20/18 1755 145 lb (65.8 kg)     Height 08/20/18 1755 6' (1.829 m)     Head Circumference --      Peak Flow --      Pain Score 08/20/18 1755 5     Pain Loc --      Pain Edu? --      Excl. in GC? --     Constitutional: Alert and oriented. Chronically ill appearing and in no acute distress. Normal mental status. Eyes: Conjunctivae are normal. PERRL. Head: Atraumatic. Nose: No congestion/rhinnorhea. Mouth/Throat: Mucous membranes are moist.  Oropharynx non-erythematous. Tongue normal in size and color. Neck: No stridor. No carotid bruit appreciated on exam. Hematological/Lymphatic/Immunilogical: No cervical lymphadenopathy. Cardiovascular: Normal rate, regular rhythm. Grossly normal heart sounds.  Good peripheral circulation. Respiratory: Normal respiratory effort.  No retractions. Lungs CTAB. Gastrointestinal: Soft and nontender. No distention. No abdominal bruits.  No CVA tenderness. Genitourinary: Exam deferred. Musculoskeletal: No lower extremity tenderness. No edema of extremities. Neurologic:  Normal speech and language. No gross focal neurologic deficits are appreciated. Skin:  Skin is warm, dry and intact. No rash noted. Psychiatric: Mood and affect are normal. Speech and behavior are normal.  ____________________________________________   LABS (all labs ordered are listed, but only abnormal results are displayed)  Labs Reviewed  BASIC METABOLIC PANEL - Abnormal; Notable for the following components:      Result Value   Sodium 131 (*)    Chloride 93 (*)    Glucose, Bld 496 (*)    BUN 26 (*)    Creatinine, Ser 1.30 (*)    Calcium 8.6 (*)    All other components within normal limits  CBC - Abnormal; Notable for the following components:   RBC 3.62 (*)    Hemoglobin 9.8 (*)    HCT 30.5 (*)  All other components within normal limits  BRAIN NATRIURETIC PEPTIDE - Abnormal; Notable for the following components:   B Natriuretic Peptide 1,168.0 (*)    All other components within normal limits  URINALYSIS, COMPLETE (UACMP) WITH MICROSCOPIC - Abnormal; Notable for the following components:   Color, Urine STRAW (*)    APPearance CLEAR (*)    Glucose, UA >=500 (*)    Hgb urine dipstick SMALL (*)    Protein, ur 100 (*)    All other components within normal limits  GLUCOSE, CAPILLARY - Abnormal; Notable for the following components:   Glucose-Capillary 502 (*)    All other components within normal limits  GLUCOSE, CAPILLARY - Abnormal; Notable for the following components:   Glucose-Capillary 374 (*)    All other components within normal limits  HEMOGLOBIN A1C - Abnormal; Notable for the following components:   Hgb A1c MFr Bld 14.2 (*)    All other components within normal limits  BASIC METABOLIC PANEL - Abnormal; Notable for the following components:   Sodium 134 (*)    Glucose, Bld 401 (*)    BUN 23 (*)    Calcium 8.8 (*)    All  other components within normal limits  CBC - Abnormal; Notable for the following components:   RBC 3.69 (*)    Hemoglobin 9.8 (*)    HCT 31.4 (*)    All other components within normal limits  GLUCOSE, CAPILLARY - Abnormal; Notable for the following components:   Glucose-Capillary 393 (*)    All other components within normal limits  TROPONIN I - Abnormal; Notable for the following components:   Troponin I 0.03 (*)    All other components within normal limits  HEMOGLOBIN A1C - Abnormal; Notable for the following components:   Hgb A1c MFr Bld 14.0 (*)    All other components within normal limits  CBC - Abnormal; Notable for the following components:   RBC 3.42 (*)    Hemoglobin 9.3 (*)    HCT 28.7 (*)    All other components within normal limits  GLUCOSE, CAPILLARY - Abnormal; Notable for the following components:   Glucose-Capillary 393 (*)    All other components within normal limits  SARS CORONAVIRUS 2 (HOSPITAL ORDER, PERFORMED IN [facility])  TROPONIN I  TROPONIN I  TSH  TROPONIN I  LIPID PANEL  CREATININE, SERUM  GLUCOSE, CAPILLARY  TROPONIN I  CBG MONITORING, ED   ____________________________________________  EKG  ED ECG REPORT I, Olubunmi Rothenberger, FNP-BC personally viewed and interpreted this ECG.   Date: 08/20/2018  EKG Time: 5:49  Rate: 78  Rhythm: normal EKG, normal sinus rhythm, unchanged from previous tracings  Axis: normal  Intervals:none  ST&T Change: none  ____________________________________________  RADIOLOGY  ED MD interpretation:  No acute findings.  Official radiology report(s): Dg Chest Portable 1 View  Result Date: 08/20/2018 CLINICAL DATA:  48 year old male with chest pain and shortness of breath. EXAM: PORTABLE CHEST 1 VIEW COMPARISON:  Chest radiograph dated 05/05/2018 FINDINGS: There is mild cardiomegaly with vascular congestion. No significant edema. No pleural effusion or pneumothorax. No focal consolidation. Median  sternotomy wires and CABG vascular clips. No acute osseous pathology. IMPRESSION: Cardiomegaly with mild vascular congestion. No focal consolidation. Electronically Signed   By: Elgie Collard M.D.   On: 08/20/2018 19:27    ____________________________________________   PROCEDURES  Procedure(s) performed: None  Procedures  Critical Care performed: No  ____________________________________________   INITIAL IMPRESSION / ASSESSMENT AND PLAN / ED  COURSE  As part of my medical decision making, I reviewed the following data within the electronic MEDICAL RECORD NUMBER Old EKG reviewed, Old chart reviewed and Patient signed out to Dr. Darnelle Catalan.  48 year old male presents with chest pressure. Symptoms started about 10 minutes prior to arrival. Cardiac workup to be completed.  ----------------------------------------- 8:54 PM on 08/20/2018 -----------------------------------------  Initial labs show hyperglycemia and increasing BUN and Creatinine. Dr. Darnelle Catalan to assume care at this time and determine disposition.   Jesse Christian was evaluated in Emergency Department on 08/21/2018 for the symptoms described in the history of present illness. He was evaluated in the context of the global COVID-19 pandemic, which necessitated consideration that the patient might be at risk for infection with the SARS-CoV-2 virus that causes COVID-19. Institutional protocols and algorithms that pertain to the evaluation of patients at risk for COVID-19 are in a state of rapid change based on information released by regulatory bodies including the CDC and federal and state organizations. These policies and algorithms were followed during the patient's care in the ED. _________________________________________   FINAL CLINICAL IMPRESSION(S) / ED DIAGNOSES  Final diagnoses:  Hyperglycemia  Nonspecific chest pain     ED Discharge Orders    None       Note:  This document was prepared using Dragon voice  recognition software and may include unintentional dictation errors.    Chinita Pester, FNP 08/21/18 1538    Arnaldo Natal, MD 08/22/18 [phone]

## 2018-08-20 NOTE — ED Provider Notes (Signed)
Patient is still having chest pain.  He is at high risk with congestive failure and having to wear the Zoll LifeVest.  He now his troponins are negative we will get him in.  He lives by himself and would have difficulty calling for help if you got into trouble.  His sugars are still high even after the insulin.   Arnaldo Natal, MD 08/21/18 [phone]

## 2018-08-21 ENCOUNTER — Encounter: Payer: Self-pay | Admitting: *Deleted

## 2018-08-21 DIAGNOSIS — R079 Chest pain, unspecified: Secondary | ICD-10-CM | POA: Diagnosis present

## 2018-08-21 DIAGNOSIS — I5043 Acute on chronic combined systolic (congestive) and diastolic (congestive) heart failure: Secondary | ICD-10-CM | POA: Diagnosis present

## 2018-08-21 DIAGNOSIS — I509 Heart failure, unspecified: Secondary | ICD-10-CM

## 2018-08-21 LAB — CBC
HCT: 31.4 % — ABNORMAL LOW (ref 39.0–52.0)
HCT: 28.7 % — ABNORMAL LOW (ref 39.0–52.0)
Hemoglobin: 9.8 g/dL — ABNORMAL LOW (ref 13.0–17.0)
Hemoglobin: 9.3 g/dL — ABNORMAL LOW (ref 13.0–17.0)
MCH: 26.6 pg (ref 26.0–34.0)
MCH: 27.2 pg (ref 26.0–34.0)
MCHC: 31.2 g/dL (ref 30.0–36.0)
MCHC: 32.4 g/dL (ref 30.0–36.0)
MCV: 85.1 fL (ref 80.0–100.0)
MCV: 83.9 fL (ref 80.0–100.0)
Platelets: 313 10*3/uL (ref 150–400)
Platelets: 269 10*3/uL (ref 150–400)
RBC: 3.69 MIL/uL — ABNORMAL LOW (ref 4.22–5.81)
RBC: 3.42 MIL/uL — ABNORMAL LOW (ref 4.22–5.81)
RDW: 14.8 % (ref 11.5–15.5)
RDW: 14.7 % (ref 11.5–15.5)
WBC: 7.4 10*3/uL (ref 4.0–10.5)
WBC: 7.4 10*3/uL (ref 4.0–10.5)
nRBC: 0 % (ref 0.0–0.2)
nRBC: 0 % (ref 0.0–0.2)

## 2018-08-21 LAB — GLUCOSE, CAPILLARY
Glucose-Capillary: 393 mg/dL — ABNORMAL HIGH (ref 70–99)
Glucose-Capillary: 393 mg/dL — ABNORMAL HIGH (ref 70–99)
Glucose-Capillary: 95 mg/dL (ref 70–99)
Glucose-Capillary: 231 mg/dL — ABNORMAL HIGH (ref 70–99)
Glucose-Capillary: 274 mg/dL — ABNORMAL HIGH (ref 70–99)

## 2018-08-21 LAB — BASIC METABOLIC PANEL
Anion gap: 9 (ref 5–15)
BUN: 23 mg/dL — ABNORMAL HIGH (ref 6–20)
CO2: 27 mmol/L (ref 22–32)
Calcium: 8.8 mg/dL — ABNORMAL LOW (ref 8.9–10.3)
Chloride: 98 mmol/L (ref 98–111)
Creatinine, Ser: 1.14 mg/dL (ref 0.61–1.24)
GFR calc Af Amer: >60 mL/min (ref >60)
GFR calc non Af Amer: >60 mL/min (ref >60)
Glucose, Bld: 401 mg/dL — ABNORMAL HIGH (ref 70–99)
Potassium: 4.3 mmol/L (ref 3.5–5.1)
Sodium: 134 mmol/L — ABNORMAL LOW (ref 135–145)

## 2018-08-21 LAB — TROPONIN I
Troponin I: 0.03 ng/mL (ref <0.03)
Troponin I: <0.03 ng/mL (ref <0.03)
Troponin I: <0.03 ng/mL (ref <0.03)

## 2018-08-21 LAB — LIPID PANEL
Cholesterol: 134 mg/dL (ref 0–200)
HDL: 54 mg/dL (ref >40)
LDL Cholesterol: 54 mg/dL (ref 0–99)
Total CHOL/HDL Ratio: 2.5 RATIO
Triglycerides: 130 mg/dL (ref <150)
VLDL: 26 mg/dL (ref 0–40)

## 2018-08-21 LAB — CREATININE, SERUM
Creatinine, Ser: 1.17 mg/dL (ref 0.61–1.24)
GFR calc Af Amer: >60 mL/min (ref >60)
GFR calc non Af Amer: >60 mL/min (ref >60)

## 2018-08-21 LAB — HEMOGLOBIN A1C
Hgb A1c MFr Bld: 14.2 % — ABNORMAL HIGH (ref 4.8–5.6)
Hgb A1c MFr Bld: 14 % — ABNORMAL HIGH (ref 4.8–5.6)
Mean Plasma Glucose: 360.84 mg/dL
Mean Plasma Glucose: 355.1 mg/dL

## 2018-08-21 LAB — TSH: TSH: 0.641 u[IU]/mL (ref 0.350–4.500)

## 2018-08-21 MED ORDER — INSULIN ASPART 100 UNIT/ML ~~LOC~~ SOLN
3.0000 [IU] | Freq: Three times a day (TID) | SUBCUTANEOUS | Status: DC
  Administered 2018-08-21 – 2018-08-22 (×4): 3 [IU] via SUBCUTANEOUS
  Filled 2018-08-21 (×4): qty 1

## 2018-08-21 MED ORDER — ONDANSETRON HCL 4 MG PO TABS: 4.0000 mg | ORAL_TABLET | Freq: Four times a day (QID) | ORAL | Status: DC | PRN

## 2018-08-21 MED ORDER — ENOXAPARIN SODIUM 40 MG/0.4ML ~~LOC~~ SOLN
40.0000 mg | SUBCUTANEOUS | Status: DC
  Administered 2018-08-21 – 2018-08-22 (×2): 40 mg via SUBCUTANEOUS
  Filled 2018-08-21 (×2): qty 0.4

## 2018-08-21 MED ORDER — CLOPIDOGREL BISULFATE 75 MG PO TABS
75.0000 mg | ORAL_TABLET | Freq: Every day | ORAL | Status: DC
  Administered 2018-08-21 – 2018-08-22 (×2): 75 mg via ORAL
  Filled 2018-08-21 (×2): qty 1

## 2018-08-21 MED ORDER — ENOXAPARIN SODIUM 40 MG/0.4ML ~~LOC~~ SOLN: 40.0000 mg | SUBCUTANEOUS | Status: DC

## 2018-08-21 MED ORDER — ONDANSETRON HCL 4 MG/2ML IJ SOLN: 4.0000 mg | Freq: Four times a day (QID) | INTRAMUSCULAR | Status: DC | PRN

## 2018-08-21 MED ORDER — ACETAMINOPHEN 325 MG PO TABS: 650.0000 mg | ORAL_TABLET | Freq: Four times a day (QID) | ORAL | Status: DC | PRN

## 2018-08-21 MED ORDER — TRAZODONE HCL 50 MG PO TABS: 50.0000 mg | ORAL_TABLET | Freq: Every evening | ORAL | Status: DC | PRN

## 2018-08-21 MED ORDER — POLYETHYLENE GLYCOL 3350 17 G PO PACK: 17.0000 g | PACK | Freq: Every day | ORAL | Status: DC | PRN

## 2018-08-21 MED ORDER — INSULIN ASPART 100 UNIT/ML ~~LOC~~ SOLN
0.0000 [IU] | Freq: Every day | SUBCUTANEOUS | Status: DC
  Administered 2018-08-21: 3 [IU] via SUBCUTANEOUS
  Filled 2018-08-21: qty 1

## 2018-08-21 MED ORDER — LOSARTAN POTASSIUM 25 MG PO TABS
25.0000 mg | ORAL_TABLET | Freq: Every day | ORAL | Status: DC
  Administered 2018-08-21 – 2018-08-22 (×2): 25 mg via ORAL
  Filled 2018-08-21 (×2): qty 1

## 2018-08-21 MED ORDER — METOPROLOL SUCCINATE ER 25 MG PO TB24
25.0000 mg | ORAL_TABLET | Freq: Every day | ORAL | Status: DC
  Administered 2018-08-21 – 2018-08-22 (×2): 25 mg via ORAL
  Filled 2018-08-21 (×2): qty 1

## 2018-08-21 MED ORDER — FUROSEMIDE 40 MG PO TABS
40.0000 mg | ORAL_TABLET | Freq: Two times a day (BID) | ORAL | Status: DC
  Administered 2018-08-21: 02:00:00 40 mg via ORAL
  Filled 2018-08-21 (×2): qty 1

## 2018-08-21 MED ORDER — METHADONE HCL 10 MG PO TABS
130.0000 mg | ORAL_TABLET | Freq: Every day | ORAL | Status: DC
  Administered 2018-08-21 – 2018-08-22 (×2): 130 mg via ORAL
  Filled 2018-08-21 (×3): qty 13

## 2018-08-21 MED ORDER — ACETAMINOPHEN 650 MG RE SUPP: 650.0000 mg | Freq: Four times a day (QID) | RECTAL | Status: DC | PRN

## 2018-08-21 MED ORDER — INSULIN DETEMIR 100 UNIT/ML ~~LOC~~ SOLN
26.0000 [IU] | Freq: Every day | SUBCUTANEOUS | Status: DC
  Administered 2018-08-21 (×2): 26 [IU] via SUBCUTANEOUS
  Filled 2018-08-21 (×3): qty 0.26

## 2018-08-21 MED ORDER — TRAZODONE HCL 50 MG PO TABS: 25.0000 mg | ORAL_TABLET | Freq: Every evening | ORAL | Status: DC | PRN

## 2018-08-21 MED ORDER — SODIUM CHLORIDE 0.9% FLUSH: 3.0000 mL | INTRAVENOUS | Status: DC | PRN

## 2018-08-21 MED ORDER — NEPRO/CARBSTEADY PO LIQD
237.0000 mL | Freq: Two times a day (BID) | ORAL | Status: DC
  Administered 2018-08-21 – 2018-08-22 (×2): 237 mL via ORAL

## 2018-08-21 MED ORDER — ATORVASTATIN CALCIUM 20 MG PO TABS
80.0000 mg | ORAL_TABLET | Freq: Every day | ORAL | Status: DC
  Administered 2018-08-21 (×2): 80 mg via ORAL
  Filled 2018-08-21 (×2): qty 4

## 2018-08-21 MED ORDER — ATORVASTATIN CALCIUM 20 MG PO TABS: 80.0000 mg | ORAL_TABLET | Freq: Every day | ORAL | Status: DC

## 2018-08-21 MED ORDER — ADULT MULTIVITAMIN W/MINERALS CH
1.0000 | ORAL_TABLET | Freq: Every day | ORAL | Status: DC
  Administered 2018-08-21 – 2018-08-22 (×2): 1 via ORAL
  Filled 2018-08-21 (×2): qty 1

## 2018-08-21 MED ORDER — MAGNESIUM OXIDE 400 (241.3 MG) MG PO TABS
400.0000 mg | ORAL_TABLET | Freq: Every day | ORAL | Status: DC
  Administered 2018-08-21 – 2018-08-22 (×2): 400 mg via ORAL
  Filled 2018-08-21 (×2): qty 1

## 2018-08-21 MED ORDER — INSULIN ASPART 100 UNIT/ML ~~LOC~~ SOLN
0.0000 [IU] | Freq: Three times a day (TID) | SUBCUTANEOUS | Status: DC
  Administered 2018-08-21: 15 [IU] via SUBCUTANEOUS
  Administered 2018-08-21: 5 [IU] via SUBCUTANEOUS
  Administered 2018-08-22: 3 [IU] via SUBCUTANEOUS
  Administered 2018-08-22: 8 [IU] via SUBCUTANEOUS
  Filled 2018-08-21 (×4): qty 1

## 2018-08-21 MED ORDER — SODIUM CHLORIDE 0.9 % IV SOLN: 250.0000 mL | INTRAVENOUS | Status: DC | PRN

## 2018-08-21 MED ORDER — FUROSEMIDE 10 MG/ML IJ SOLN
20.0000 mg | Freq: Two times a day (BID) | INTRAMUSCULAR | Status: DC
  Administered 2018-08-21 – 2018-08-22 (×3): 20 mg via INTRAVENOUS
  Filled 2018-08-21 (×4): qty 2

## 2018-08-21 MED ORDER — ASPIRIN 81 MG PO CHEW
81.0000 mg | CHEWABLE_TABLET | Freq: Every day | ORAL | Status: DC
  Administered 2018-08-21 – 2018-08-22 (×2): 81 mg via ORAL
  Filled 2018-08-21 (×2): qty 1

## 2018-08-21 MED ORDER — SODIUM CHLORIDE 0.9% FLUSH
3.0000 mL | Freq: Two times a day (BID) | INTRAVENOUS | Status: DC
  Administered 2018-08-21 – 2018-08-22 (×4): 3 mL via INTRAVENOUS

## 2018-08-21 NOTE — Progress Notes (Signed)
Patient briefly seen and examined this morning.  Agree with admitting MD plan.  Patient is planned for AICD next week by his cardiologist.  Patient reports symptoms have improved since admission.

## 2018-08-21 NOTE — H&P (Signed)
[employer] - [place] [facility]   PATIENT NAME: Jesse Christian    MR#:  [id number]  DATE OF BIRTH:  [birth year]  DATE OF ADMISSION:  08/20/2018  PRIMARY CARE PHYSICIAN: Dorcas Carrow, DO   REQUESTING/REFERRING PHYSICIAN: Marge Duncans, MD  CHIEF COMPLAINT:   Chief Complaint  Patient presents with  . Chest Pain    HISTORY OF PRESENT ILLNESS:  Jesse Christian  is a 48 y.o. male with a known history of coronary artery disease status post coronary artery bypass graft x2, CHF, diabetes mellitus, hyperlipidemia.  He presents to the emergency room complaining of chest pain.  Chest pain has been persistent through most of the day on 08/20/2018 described as midsternal pressure with a pain score 8-10 out of 10 lasting about 5 minutes.  Pain is made worse with exertion.  Radiating to his left axillary area with associated nausea, shortness of breath, diaphoresis, and weakness at times.  He has a history of CHF with LifeVest planning ICD placement next week with Dr. Excell Seltzer at [facility].  Arrival, his BNP is elevated, 1168, and troponin is negative.  Chest x-ray demonstrates mild pulmonary vascular congestion.  Patient had a recent echocardiogram completed on 05/06/2018 demonstrating ejection fraction of 20% with grade 1 diastolic dysfunction.  We have admitted him to the hospitalist service for acute on chronic combined CHF.  PAST MEDICAL HISTORY:   Past Medical History:  Diagnosis Date  . Diabetes mellitus without complication (HCC)   . Drug abuse (HCC)   . Hyperlipidemia   . Right arm fracture 2018    PAST SURGICAL HISTORY:   Past Surgical History:  Procedure Laterality Date  . CARDIAC SURGERY    . CARDIAC SURGERY  05/2018   double bypass  . LEFT HEART CATH AND CORONARY ANGIOGRAPHY Right 05/06/2018   Procedure: LEFT HEART CATH AND CORONARY ANGIOGRAPHY;  Surgeon: Laurier Nancy, MD;  Location: [facility] INVASIVE CV LAB;  Service: Cardiovascular;  Laterality: Right;     SOCIAL HISTORY:   Social History   Tobacco Use  . Smoking status: Former Smoker    Packs/day: 0.50    Types: Cigarettes  . Smokeless tobacco: Never Used  Substance Use Topics  . Alcohol use: No    FAMILY HISTORY:   Family History  Problem Relation Age of Onset  . Mental illness Mother   . Heart attack Father   . Heart disease Father   . Mental illness Sister   . Cancer Maternal Aunt        Lung cancer  . Prostate cancer Neg Hx   . Bladder Cancer Neg Hx   . Kidney cancer Neg Hx     DRUG ALLERGIES:   Allergies  Allergen Reactions  . Lyrica [Pregabalin] Other (See Comments)    Peripheral edema  . Metformin And Related Nausea And Vomiting    REVIEW OF SYSTEMS:   Review of Systems  Constitutional: Negative for chills, fever and malaise/fatigue.  HENT: Negative for congestion, sinus pain and sore throat.   Eyes: Negative for blurred vision and double vision.  Respiratory: Positive for shortness of breath. Negative for cough, sputum production and wheezing.   Cardiovascular: Positive for chest pain. Negative for palpitations and PND.  Gastrointestinal: Positive for nausea. Negative for abdominal pain, blood in stool, constipation, diarrhea, melena and vomiting.  Genitourinary: Negative for dysuria and hematuria.  Musculoskeletal: Negative for falls and myalgias.  Neurological: Negative for dizziness, weakness and headaches.  Psychiatric/Behavioral: Negative.  Negative for depression.  MEDICATIONS AT HOME:   Prior to Admission medications   Medication Sig Start Date End Date Taking? Authorizing Provider  aspirin 81 MG chewable tablet Chew 81 mg by mouth daily.    Yes [provider]  atorvastatin (LIPITOR) 80 MG tablet Take 80 mg by mouth daily. 07/30/18  Yes [provider]  clopidogrel (PLAVIX) 75 MG tablet Take 1 tablet (75 mg total) by mouth daily. 07/01/18  Yes [name], [name], DO  furosemide (LASIX) 40 MG tablet Take 1 tablet by mouth 2  (two) times a day. 07/30/18  Yes [provider]  insulin aspart (NOVOLOG) 100 UNIT/ML injection Inject 3 Units into the skin 3 (three) times daily with meals for 30 days. 07/23/18 08/22/18 Yes [name], [name], DO  insulin detemir (LEVEMIR) 100 UNIT/ML injection Inject 0.26 mLs (26 Units total) into the skin at bedtime for 30 days. 07/23/18 08/22/18 Yes [name], [name], DO  losartan (COZAAR) 25 MG tablet Take 25 mg by mouth daily. 07/30/18  Yes [provider]  magnesium oxide (MAG-OX) 400 MG tablet Take 400 mg by mouth daily.   Yes [provider]  methadone (DOLOPHINE) 10 MG tablet Take 130 mg by mouth daily.    Yes [provider]  metoprolol succinate (TOPROL-XL) 25 MG 24 hr tablet Take 25 mg by mouth daily. 07/30/18  Yes [provider]  ondansetron (ZOFRAN) 4 MG tablet Take 1 tablet (4 mg total) by mouth every 6 (six) hours as needed for nausea. 07/16/18  Yes Ojie, Jude, MD      VITAL SIGNS:  Blood pressure 116/71, pulse 73, temperature 98.1 F (36.7 C), temperature source Oral, resp. rate 20, height  (1.905 m), weight 68.3 kg, SpO2 100 %.  PHYSICAL EXAMINATION:  Physical Exam Vitals signs reviewed.  Constitutional:      Appearance: He is well-developed.  HENT:     Head: Normocephalic.  Eyes:     Pupils: Pupils are equal, round, and reactive to light.  Neck:     Musculoskeletal: Normal range of motion and neck supple.  Cardiovascular:     Rate and Rhythm: Normal rate and regular rhythm.     Heart sounds: Normal heart sounds. No murmur. No friction rub. No gallop.   Pulmonary:     Effort: Pulmonary effort is normal. No respiratory distress.     Breath sounds: Normal breath sounds.  Abdominal:     General: Bowel sounds are normal.     Palpations: Abdomen is soft.  Musculoskeletal: Normal range of motion.     Right lower leg: No edema.     Left lower leg: No edema.  Skin:    General: Skin is warm and dry.  Neurological:      General: No focal deficit present.     Mental Status: He is alert.  Psychiatric:        Mood and Affect: Mood normal.        Behavior: Behavior normal.      LABORATORY PANEL:   CBC Recent Labs  Lab 08/21/18 0322  WBC 7.4  HGB 9.8*  HCT 31.4*  PLT 313   ------------------------------------------------------------------------------------------------------------------  Chemistries  Recent Labs  Lab 08/21/18 0322  NA 134*  K 4.3  CL 98  CO2 27  GLUCOSE 401*  BUN 23*  CREATININE 1.14  CALCIUM 8.8*   ------------------------------------------------------------------------------------------------------------------  Cardiac Enzymes Recent Labs  Lab 08/20/18 2132  TROPONINI <0.03   ------------------------------------------------------------------------------------------------------------------  RADIOLOGY:  Dg Chest Portable 1 View  Result Date: 08/20/2018 CLINICAL DATA:  48 year old male with chest pain and shortness of breath. EXAM: PORTABLE CHEST 1 VIEW COMPARISON:  Chest radiograph dated 05/05/2018 FINDINGS: There is mild cardiomegaly with vascular congestion. No significant edema. No pleural effusion or pneumothorax. No focal consolidation. Median sternotomy wires and CABG vascular clips. No acute osseous pathology. IMPRESSION: Cardiomegaly with mild vascular congestion. No focal consolidation. Electronically Signed   By: Elgie CollardArash  Radparvar M.D.   On: 08/20/2018 19:27      IMPRESSION AND PLAN:   1. acute on chronic systolic and diastolic CHF - Will treat patient with diuretic therapy and continue to trend cardiac enzymes - We will get cardiology consult - He had recent echocardiogram on 05/06/2018 demonstrating 20% ejection fraction and grade 1 diastolic dysfunction - continue metoprolol -Planning ICD placement next week with Dr. Excell SeltzerBaker - Patient has LifeVest  2 chest pain - Continue to trend troponin with initial troponins negative - We will manage pain with  analgesic - Aspirin and statin therapy have been continued - We will get lipid panel  3. diabetes mellitus -We will treat with moderate sliding scale insulin -patient's Levemir insulin has been continued  4.hyperlipidemia - We will get lipid panel -Continue statin therapy DVT and PPI prophylaxis have been initiated  All the records are reviewed and case discussed with ED provider. The plan of care was discussed in details with the patient (and family). I answered all questions. The patient agreed to proceed with the above mentioned plan. Further management will depend upon hospital course.   CODE STATUS: Full code  TOTAL TIME TAKING CARE OF THIS PATIENT:45 minutes.    Pearletha AlfredAngela H Brandan Glauber M.D on 08/21/2018 at 6:29 AM  Pager - [phone]  After 6pm go to [email or web address] - Social research officer, governmentpassword [email or web address]  [facility]  Office  [phone]  CC: Primary care physician; Dorcas CarrowJohnson, [name], DO   Note: This dictation was prepared with Dragon dictation along with smaller phrase technology. Any transcriptional errors that result from this process are unintentional.

## 2018-08-21 NOTE — Plan of Care (Signed)
Receiving IV lasix, no complaints of pain.   Problem: Clinical Measurements: Goal: Respiratory complications will improve Outcome: Progressing Note:  Room air    Problem: Activity: Goal: Risk for activity intolerance will decrease Outcome: Progressing Note:  Independent in room, tolerating well   Problem: Nutrition: Goal: Adequate nutrition will be maintained Outcome: Progressing   Problem: Coping: Goal: Level of anxiety will decrease Outcome: Progressing   Problem: Elimination: Goal: Will not experience complications related to urinary retention Outcome: Progressing   Problem: Pain Managment: Goal: General experience of comfort will improve Outcome: Progressing   Problem: Safety: Goal: Ability to remain free from injury will improve Outcome: Progressing   Problem: Education: Goal: Knowledge of General Education information will improve Description Including pain rating scale, medication(s)/side effects and non-pharmacologic comfort measures Outcome: Completed/Met   

## 2018-08-21 NOTE — Consult Note (Signed)
Jesse Christian is a 48 y.o. male  [id number]  Primary Cardiologist: Dr. Adrian Blackwater Reason for Consultation: CHF, chest pain  HPI: 48yo male with known CAD s/p CABG x2, combined diastolic systolic CHF, diabetes, hyperlipidemia. He presented to ER with 8/10 chest pain, history of CHF LVEF 25-30%, grade III diastolic dysfunction, has LifeVest pending ICD placement at [facility] next week. Troponin was 0.03.    Review of Systems: Feeling well, no chest pain, very minimal shortness of breath. He reports GI discomfort.    Past Medical History:  Diagnosis Date  . Diabetes mellitus without complication (HCC)   . Drug abuse (HCC)   . Hyperlipidemia   . Right arm fracture 2018    Medications Prior to Admission  Medication Sig Dispense Refill  . aspirin 81 MG chewable tablet Chew 81 mg by mouth daily.     Marland Kitchen atorvastatin (LIPITOR) 80 MG tablet Take 80 mg by mouth daily.    . clopidogrel (PLAVIX) 75 MG tablet Take 1 tablet (75 mg total) by mouth daily. 90 tablet 1  . furosemide (LASIX) 40 MG tablet Take 1 tablet by mouth 2 (two) times a day.    . insulin aspart (NOVOLOG) 100 UNIT/ML injection Inject 3 Units into the skin 3 (three) times daily with meals for 30 days. 10 mL 3  . insulin detemir (LEVEMIR) 100 UNIT/ML injection Inject 0.26 mLs (26 Units total) into the skin at bedtime for 30 days. 10 mL 3  . losartan (COZAAR) 25 MG tablet Take 25 mg by mouth daily.    . magnesium oxide (MAG-OX) 400 MG tablet Take 400 mg by mouth daily.    . methadone (DOLOPHINE) 10 MG tablet Take 130 mg by mouth daily.     . metoprolol succinate (TOPROL-XL) 25 MG 24 hr tablet Take 25 mg by mouth daily.    . ondansetron (ZOFRAN) 4 MG tablet Take 1 tablet (4 mg total) by mouth every 6 (six) hours as needed for nausea. 30 tablet 0     . aspirin  81 mg Oral Daily  . atorvastatin  80 mg Oral q1800  . clopidogrel  75 mg Oral Daily  . enoxaparin (LOVENOX) injection  40 mg Subcutaneous Q24H  . furosemide  20 mg  Intravenous BID  . furosemide  40 mg Oral BID  . insulin aspart  0-15 Units Subcutaneous TID WC  . insulin aspart  0-5 Units Subcutaneous QHS  . insulin aspart  3 Units Subcutaneous TID WC  . insulin detemir  26 Units Subcutaneous QHS  . losartan  25 mg Oral Daily  . magnesium oxide  400 mg Oral Daily  . methadone  130 mg Oral Daily  . metoprolol succinate  25 mg Oral Daily  . sodium chloride flush  3 mL Intravenous Q12H    Infusions: . sodium chloride    . sodium chloride Stopped (08/20/18 1959)    Allergies  Allergen Reactions  . Lyrica [Pregabalin] Other (See Comments)    Peripheral edema  . Metformin And Related Nausea And Vomiting    Social History   Socioeconomic History  . Marital status: Single    Spouse name: Not on file  . Number of children: Not on file  . Years of education: Not on file  . Highest education level: Not on file  Occupational History  . Not on file  Social Needs  . Financial resource strain: Not on file  . Food insecurity:    Worry: Not on file  Inability: Not on file  . Transportation needs:    Medical: Not on file    Non-medical: Not on file  Tobacco Use  . Smoking status: Former Smoker    Packs/day: 0.50    Types: Cigarettes  . Smokeless tobacco: Never Used  Substance and Sexual Activity  . Alcohol use: No  . Drug use: No  . Sexual activity: Not on file  Lifestyle  . Physical activity:    Days per week: Not on file    Minutes per session: Not on file  . Stress: Not on file  Relationships  . Social connections:    Talks on phone: Not on file    Gets together: Not on file    Attends religious service: Not on file    Active member of club or organization: Not on file    Attends meetings of clubs or organizations: Not on file    Relationship status: Not on file  . Intimate partner violence:    Fear of current or ex partner: Not on file    Emotionally abused: Not on file    Physically abused: Not on file    Forced sexual  activity: Not on file  Other Topics Concern  . Not on file  Social History Narrative  . Not on file    Family History  Problem Relation Age of Onset  . Mental illness Mother   . Heart attack Father   . Heart disease Father   . Mental illness Sister   . Cancer Maternal Aunt        Lung cancer  . Prostate cancer Neg Hx   . Bladder Cancer Neg Hx   . Kidney cancer Neg Hx     PHYSICAL EXAM: Vitals:   08/21/18 0429 08/21/18 0744  BP: 116/71 (!) 132/92  Pulse: 73 84  Resp:  18  Temp:  97.8 F (36.6 C)  SpO2:  99%     Intake/Output Summary (Last 24 hours) at 08/21/2018 0941 Last data filed at 08/21/2018 [id number] Gross per 24 hour  Intake 1483 ml  Output 1985 ml  Net -502 ml    General:  Well appearing. No respiratory difficulty HEENT: normal Neck: supple. no JVD. Carotids 2+ bilat; no bruits. No lymphadenopathy or thryomegaly appreciated. Cor: PMI nondisplaced. Regular rate & rhythm. No rubs, gallops or murmurs. Lungs: clear Abdomen: soft, nontender, nondistended. No hepatosplenomegaly. No bruits or masses. Good bowel sounds. Extremities: no cyanosis, clubbing, rash, edema Neuro: alert & oriented x 3, cranial nerves grossly intact. moves all 4 extremities w/o difficulty. Affect pleasant.  ECG: Normal sinus rhythm 78bpm Biatrial enlargement ST & T wave abnormality, consider lateral ischemia Prolonged QT, 490ms    Results for orders placed or performed during the hospital encounter of 08/20/18 (from the past 24 hour(s))  Basic metabolic panel     Status: Abnormal   Collection Time: 08/20/18  6:13 PM  Result Value Ref Range   Sodium 131 (L) 135 - 145 mmol/L   Potassium 4.5 3.5 - 5.1 mmol/L   Chloride 93 (L) 98 - 111 mmol/L   CO2 28 22 - 32 mmol/L   Glucose, Bld 496 (H) 70 - 99 mg/dL   BUN 26 (H) 6 - 20 mg/dL   Creatinine, Ser 9.601.30 (H) 0.61 - 1.24 mg/dL   Calcium 8.6 (L) 8.9 - 10.3 mg/dL   GFR calc non Af Amer >60 >60 mL/min   GFR calc Af Amer >60 >60 mL/min    Anion gap 10  5 - 15  CBC     Status: Abnormal   Collection Time: 08/20/18  6:13 PM  Result Value Ref Range   WBC 6.8 4.0 - 10.5 K/uL   RBC 3.62 (L) 4.22 - 5.81 MIL/uL   Hemoglobin 9.8 (L) 13.0 - 17.0 g/dL   HCT 04.5 (L) 40.9 - 81.1 %   MCV 84.3 80.0 - 100.0 fL   MCH 27.1 26.0 - 34.0 pg   MCHC 32.1 30.0 - 36.0 g/dL   RDW 91.4 78.2 - 95.6 %   Platelets 302 150 - 400 K/uL   nRBC 0.0 0.0 - 0.2 %  Troponin I - ONCE - STAT     Status: None   Collection Time: 08/20/18  6:13 PM  Result Value Ref Range   Troponin I <0.03 <0.03 ng/mL  Urinalysis, Complete w Microscopic     Status: Abnormal   Collection Time: 08/20/18  6:13 PM  Result Value Ref Range   Color, Urine STRAW (A) YELLOW   APPearance CLEAR (A) CLEAR   Specific Gravity, Urine 1.012 1.005 - 1.030   pH 7.0 5.0 - 8.0   Glucose, UA >=500 (A) NEGATIVE mg/dL   Hgb urine dipstick SMALL (A) NEGATIVE   Bilirubin Urine NEGATIVE NEGATIVE   Ketones, ur NEGATIVE NEGATIVE mg/dL   Protein, ur 213 (A) NEGATIVE mg/dL   Nitrite NEGATIVE NEGATIVE   Leukocytes,Ua NEGATIVE NEGATIVE   RBC / HPF 6-10 0 - 5 RBC/hpf   WBC, UA 0-5 0 - 5 WBC/hpf   Bacteria, UA NONE SEEN NONE SEEN   Squamous Epithelial / LPF NONE SEEN 0 - 5  Brain natriuretic peptide     Status: Abnormal   Collection Time: 08/20/18  6:14 PM  Result Value Ref Range   B Natriuretic Peptide 1,168.0 (H) 0.0 - 100.0 pg/mL  Troponin I - Once-Timed     Status: None   Collection Time: 08/20/18  9:32 PM  Result Value Ref Range   Troponin I <0.03 <0.03 ng/mL  SARS Coronavirus 2 (CEPHEID - Performed in [facility] hospital lab), Hosp Order     Status: None   Collection Time: 08/20/18  9:32 PM  Result Value Ref Range   SARS Coronavirus 2 NEGATIVE NEGATIVE  Glucose, capillary     Status: Abnormal   Collection Time: 08/20/18  9:45 PM  Result Value Ref Range   Glucose-Capillary 502 (HH) 70 - 99 mg/dL   Comment 1 Document in Chart   Glucose, capillary     Status: Abnormal   Collection Time:  08/20/18 11:10 PM  Result Value Ref Range   Glucose-Capillary 374 (H) 70 - 99 mg/dL  Glucose, capillary     Status: Abnormal   Collection Time: 08/21/18  2:54 AM  Result Value Ref Range   Glucose-Capillary 393 (H) 70 - 99 mg/dL   Comment 1 Notify RN    Comment 2 Document in Chart   TSH     Status: None   Collection Time: 08/21/18  3:22 AM  Result Value Ref Range   TSH 0.641 0.350 - 4.500 uIU/mL  Basic metabolic panel     Status: Abnormal   Collection Time: 08/21/18  3:22 AM  Result Value Ref Range   Sodium 134 (L) 135 - 145 mmol/L   Potassium 4.3 3.5 - 5.1 mmol/L   Chloride 98 98 - 111 mmol/L   CO2 27 22 - 32 mmol/L   Glucose, Bld 401 (H) 70 - 99 mg/dL   BUN 23 (H) 6 -  20 mg/dL   Creatinine, Ser 0.35 0.61 - 1.24 mg/dL   Calcium 8.8 (L) 8.9 - 10.3 mg/dL   GFR calc non Af Amer >60 >60 mL/min   GFR calc Af Amer >60 >60 mL/min   Anion gap 9 5 - 15  CBC     Status: Abnormal   Collection Time: 08/21/18  3:22 AM  Result Value Ref Range   WBC 7.4 4.0 - 10.5 K/uL   RBC 3.69 (L) 4.22 - 5.81 MIL/uL   Hemoglobin 9.8 (L) 13.0 - 17.0 g/dL   HCT 46.5 (L) 68.1 - 27.5 %   MCV 85.1 80.0 - 100.0 fL   MCH 26.6 26.0 - 34.0 pg   MCHC 31.2 30.0 - 36.0 g/dL   RDW 17.0 01.7 - 49.4 %   Platelets 313 150 - 400 K/uL   nRBC 0.0 0.0 - 0.2 %  Troponin I - Now Then Q6H     Status: Abnormal   Collection Time: 08/21/18  7:27 AM  Result Value Ref Range   Troponin I 0.03 (HH) <0.03 ng/mL  Lipid panel     Status: None   Collection Time: 08/21/18  7:27 AM  Result Value Ref Range   Cholesterol 134 0 - 200 mg/dL   Triglycerides 496 <759 mg/dL   HDL 54 >16 mg/dL   Total CHOL/HDL Ratio 2.5 RATIO   VLDL 26 0 - 40 mg/dL   LDL Cholesterol 54 0 - 99 mg/dL  CBC     Status: Abnormal   Collection Time: 08/21/18  7:27 AM  Result Value Ref Range   WBC 7.4 4.0 - 10.5 K/uL   RBC 3.42 (L) 4.22 - 5.81 MIL/uL   Hemoglobin 9.3 (L) 13.0 - 17.0 g/dL   HCT 38.4 (L) 66.5 - 99.3 %   MCV 83.9 80.0 - 100.0 fL   MCH  27.2 26.0 - 34.0 pg   MCHC 32.4 30.0 - 36.0 g/dL   RDW 57.0 17.7 - 93.9 %   Platelets 269 150 - 400 K/uL   nRBC 0.0 0.0 - 0.2 %  Creatinine, serum     Status: None   Collection Time: 08/21/18  7:27 AM  Result Value Ref Range   Creatinine, Ser 1.17 0.61 - 1.24 mg/dL   GFR calc non Af Amer >60 >60 mL/min   GFR calc Af Amer >60 >60 mL/min  Glucose, capillary     Status: Abnormal   Collection Time: 08/21/18  7:51 AM  Result Value Ref Range   Glucose-Capillary 393 (H) 70 - 99 mg/dL   Comment 1 Notify RN    Dg Chest Portable 1 View  Result Date: 08/20/2018 CLINICAL DATA:  48 year old male with chest pain and shortness of breath. EXAM: PORTABLE CHEST 1 VIEW COMPARISON:  Chest radiograph dated 05/05/2018 FINDINGS: There is mild cardiomegaly with vascular congestion. No significant edema. No pleural effusion or pneumothorax. No focal consolidation. Median sternotomy wires and CABG vascular clips. No acute osseous pathology. IMPRESSION: Cardiomegaly with mild vascular congestion. No focal consolidation. Electronically Signed   By: Elgie Collard M.D.   On: 08/20/2018 19:27     ASSESSMENT AND PLAN: Acute on chronic combined systolic/diastolic dysfunction: Troponin due to demand ischemia, reports no further chest pain, Had echo at [facility] 05/24/18, LVEF 25-30% grade III diastolic dysfunction, already scheduled for ICD placement at [facility] next week.  Blood pressure is stable, patient symptoms are improving, advise continuing IV lasix for one more day, may discharge tomorrow with follow up with [facility] cardiology.  Caroleen Hamman, NP-C Cell: [phone]

## 2018-08-21 NOTE — Progress Notes (Signed)
Inpatient Diabetes Program Recommendations  AACE/ADA: New Consensus Statement on Inpatient Glycemic Control (2015)  Target Ranges:  Prepandial:   less than 140 mg/dL      Peak postprandial:   less than 180 mg/dL (1-2 hours)      Critically ill patients:  140 - 180 mg/dL   Lab Results  Component Value Date   GLUCAP 95 08/21/2018   HGBA1C 14.0 (H) 08/21/2018    Review of Glycemic Control Results for Jesse Christian, Jesse Christian (MRN [id number]) as of 08/21/2018 15:14  Ref. Range 08/20/2018 21:45 08/20/2018 23:10 08/21/2018 02:54 08/21/2018 07:51 08/21/2018 11:30  Glucose-Capillary Latest Ref Range: 70 - 99 mg/dL 016 (HH) 553 (H) 748 (H) 393 (H) 95   Diabetes history: DM 2 Outpatient Diabetes medications: Levemir 30 units daily, Novolog 3 units tid with meals Current orders for Inpatient glycemic control:  Novolog moderate tid with meals and HS Novolog 3 units tid with meals Levemir 26 units q HS  Inpatient Diabetes Program Recommendations:    Spoke with patient briefly at bedside.  He has Freestyle CGM on stating it was put on at Dr. Pricilla Handler office.  He states that it is helping him with glycemic control.  A1C is improved from 15.6% to 14%/  We discussed importance of glycemic control.  He states that when he gets sick on his stomach, he has a very difficult time managing his sugars however he thinks that it is getting better.  He states that he has a f/u appointment with endocrinology.  He does not have any needs at this time stating that he has supplies/insulin at home.   Will follow.  Thanks,  Beryl Meager, RN, BC-ADM Inpatient Diabetes Coordinator Pager [phone] (8a-5p)

## 2018-08-21 NOTE — H&P (Addendum)
Attending physician admission note:  I have seen and examined the patient with Ms. Janeann Merl, CRNP.  The patient presents with acute onset of chest pain that felt as a squeezing pain with radiation to left arm as well as associated dyspnea.  No cough or wheezing or hemoptysis.  He has a Zoll life vest with plan on AICD placement scheduled next week.  His BNP was elevated at 1168 and portable chest ray showed mild pulmonary vascular congestion.  Upon physical examination:  Generally: Pleasant middle-aged Caucasian male in no acute distress Cardiovascular: Regular rate and rhythm with normal S1-S2 and no murmurs gallops or rubs. Respiratory: Clear to auscultation bilaterally Abdomen: Soft, nontender, nondistended with positive bowel sounds and no palpable organomegaly or masses. Extremities: No edema clubbing or cyanosis  Labs and radiographic studies: were all reviewed  Assessment/plan: The patient will be admitted to the telemetry bed for further management of acute on top of chronic systolic and diastolic CHF.  Will follow serial enzymes and diurese with IV Lasix as well as obtain a cardiology consult in a.m.  His most recent 2D echo showed an EF of 20% and grade 1 diastolic function on 05/06/2018.  For further details please refer to dictated admission H&P.  I have discussed the case with my nurse practitioner. I agree with the admission note and the rest of the plan of care as delineated by Ms. Janeann Merl, CRNP.

## 2018-08-21 NOTE — Progress Notes (Addendum)
Initial Nutrition Assessment  DOCUMENTATION CODES:   Underweight  INTERVENTION:   Nepro Shake po BID, each supplement provides 425 kcal and 19 grams protein  MVI daily   CHO modified diet with salt restriction   NUTRITION DIAGNOSIS:   Increased nutrient needs related to chronic illness(CHF) as evidenced by increased estimated needs.  GOAL:   Patient will meet greater than or equal to 90% of their needs  MONITOR:   PO intake, Supplement acceptance, Labs, Weight trends, I & O's, Skin  REASON FOR ASSESSMENT:   Other (Comment)(Low BMI)    ASSESSMENT:   48 y.o. male with a known history of substance abuse, coronary artery disease status post coronary artery bypass graft x2, CHF, diabetes mellitus, hyperlipidemia admitted with CHF  RD working remotely.  Pt is well known to nutrition department and this RD from multiple previous admits. Pt with poor appetite and oral intake at baseline. Pt has had numerous admits for CHF and uncontrolled DM, has been provided multiple times with educations but continues to have non compliance with diet. Pt does enjoy drinking nutritional supplements. It is difficult to trend pt's weight as it ranges from 155-170lbs. Pt previously diagnosed with moderate malnutrition in January. Pt at high risk for developing severe malnutrition but unable to diagnose at this time as NFPE cannot be performed. RD will add supplements and MVI to help pt meet his estimated needs. Will change pt to carbohydrate controlled diet with salt restriction. Pt currently eating 95% of meals today.   Medications reviewed and include: aspirin, plavix, lovenox, lasix, insulin, Mg oxide, methadone  Labs reviewed: Na 134(L), BUN 23(H) Hgb 9.3(L), Hct 28.7(L) cbgs- 393, 393, 95 x 24 hrs AIC 14.0(H)  Unable to complete Nutrition-Focused physical exam at this time.   Diet Order:   Diet Order            Diet Heart Room service appropriate? Yes; Fluid consistency: Thin  Diet  effective now             EDUCATION NEEDS:   Education needs have been addressed  Skin:  Skin Assessment: Reviewed RN Assessment  Last BM:  5/12 Height:   Ht Readings from Last 1 Encounters:  08/21/18 6\' 3"  (1.905 m)    Weight:   Wt Readings from Last 1 Encounters:  08/21/18 68.3 kg    Ideal Body Weight:  89 kg  BMI:  Body mass index is 18.82 kg/m.  Estimated Nutritional Needs:   Kcal:  2200-2500kcal/day   Protein:  110-125g/day   Fluid:  2L/day   Betsey Holiday MS, RD, LDN Pager #- [phone] Office#- [phone] After Hours Pager: [phone]

## 2018-08-21 NOTE — ED Notes (Signed)
ED TO INPATIENT HANDOFF REPORT  ED Nurse Name and Phone #: Berline Lopesgracie [phone]  S Name/Age/Gender Jesse Christian 48 y.o. male Room/Bed: ED18A/ED18A  Code Status   Code Status: Prior  Home/SNF/Other Home Patient oriented to: self, place, time and situation Is this baseline? Yes   Triage Complete: Triage complete  Chief Complaint Chest pain dizzy sweats  Triage Note Pt c/o intermittent substernal chest pain/tighness lasting about 10mins, states he has been experiencing bulching with N/v intermittently also. Pt is in NAD at present.   Allergies Allergies  Allergen Reactions  . Lyrica [Pregabalin] Other (See Comments)    Peripheral edema  . Metformin And Related Nausea And Vomiting    Level of Care/Admitting Diagnosis ED Disposition    ED Disposition Condition Comment   Admit  Hospital Area: [facility] [[id number]]  Level of Care: Med-Surg [16]  Covid Evaluation: N/A  Diagnosis: Acute CHF [facility](HCC) [[id number]][[id number]]  Admitting Physician: Hannah BeatMANSY, JAN A [[id number]]  Attending Physician: Hannah BeatMANSY, JAN A [[id number]]  Estimated length of stay: past midnight tomorrow  Certification:: I certify this patient will need inpatient services for at least 2 midnights  PT Class (Do Not Modify): Inpatient [101]  PT Acc Code (Do Not Modify): Private [1]       B Medical/Surgery History Past Medical History:  Diagnosis Date  . Diabetes mellitus without complication (HCC)   . Drug abuse (HCC)   . Hyperlipidemia   . Right arm fracture 2018   Past Surgical History:  Procedure Laterality Date  . CARDIAC SURGERY    . CARDIAC SURGERY  05/2018   double bypass  . LEFT HEART CATH AND CORONARY ANGIOGRAPHY Right 05/06/2018   Procedure: LEFT HEART CATH AND CORONARY ANGIOGRAPHY;  Surgeon: Laurier NancyKhan, Shaukat A, MD;  Location: [facility] INVASIVE CV LAB;  Service: Cardiovascular;  Laterality: Right;     A IV Location/Drains/Wounds Patient Lines/Drains/Airways Status   Active Line/Drains/Airways     Name:   Placement date:   Placement time:   Site:   Days:   Peripheral IV 08/20/18 Left Antecubital   08/20/18    1815    Antecubital   1          Intake/Output Last 24 hours No intake or output data in the 24 hours ending 08/21/18 0130  Labs/Imaging Results for orders placed or performed during the hospital encounter of 08/20/18 (from the past 48 hour(s))  Basic metabolic panel     Status: Abnormal   Collection Time: 08/20/18  6:13 PM  Result Value Ref Range   Sodium 131 (L) 135 - 145 mmol/L   Potassium 4.5 3.5 - 5.1 mmol/L   Chloride 93 (L) 98 - 111 mmol/L   CO2 28 22 - 32 mmol/L   Glucose, Bld 496 (H) 70 - 99 mg/dL   BUN 26 (H) 6 - 20 mg/dL   Creatinine, Ser 0.861.30 (H) 0.61 - 1.24 mg/dL   Calcium 8.6 (L) 8.9 - 10.3 mg/dL   GFR calc non Af Amer >60 >60 mL/min   GFR calc Af Amer >60 >60 mL/min   Anion gap 10 5 - 15    Comment: Performed at [facility], [address] [place], [place] [address]  CBC     Status: Abnormal   Collection Time: 08/20/18  6:13 PM  Result Value Ref Range   WBC 6.8 4.0 - 10.5 K/uL   RBC 3.62 (L) 4.22 - 5.81 MIL/uL   Hemoglobin 9.8 (L) 13.0 - 17.0 g/dL   HCT  30.5 (L) 39.0 - 52.0 %   MCV 84.3 80.0 - 100.0 fL   MCH 27.1 26.0 - 34.0 pg   MCHC 32.1 30.0 - 36.0 g/dL   RDW 16.1 09.6 - 04.5 %   Platelets 302 150 - 400 K/uL   nRBC 0.0 0.0 - 0.2 %    Comment: Performed at [facility], [address] [place], [place] [address]  Troponin I - ONCE - STAT     Status: None   Collection Time: 08/20/18  6:13 PM  Result Value Ref Range   Troponin I <0.03 <0.03 ng/mL    Comment: Performed at [facility], [address] [place], [place] [address]  Urinalysis, Complete w Microscopic     Status: Abnormal   Collection Time: 08/20/18  6:13 PM  Result Value Ref Range   Color, Urine STRAW (A) YELLOW   APPearance CLEAR (A) CLEAR   Specific Gravity, Urine 1.012 1.005 - 1.030   pH 7.0 5.0 - 8.0   Glucose, UA >=500 (A) NEGATIVE mg/dL    Hgb urine dipstick SMALL (A) NEGATIVE   Bilirubin Urine NEGATIVE NEGATIVE   Ketones, ur NEGATIVE NEGATIVE mg/dL   Protein, ur 829 (A) NEGATIVE mg/dL   Nitrite NEGATIVE NEGATIVE   Leukocytes,Ua NEGATIVE NEGATIVE   RBC / HPF 6-10 0 - 5 RBC/hpf   WBC, UA 0-5 0 - 5 WBC/hpf   Bacteria, UA NONE SEEN NONE SEEN   Squamous Epithelial / LPF NONE SEEN 0 - 5    Comment: Performed at [facility], [address] [place], [place] [address]  Brain natriuretic peptide     Status: Abnormal   Collection Time: 08/20/18  6:14 PM  Result Value Ref Range   B Natriuretic Peptide 1,168.0 (H) 0.0 - 100.0 pg/mL    Comment: Performed at [facility], [address] [place], [place] [address]  Troponin I - Once-Timed     Status: None   Collection Time: 08/20/18  9:32 PM  Result Value Ref Range   Troponin I <0.03 <0.03 ng/mL    Comment: Performed at [facility], [address] [place], [place] [address]  SARS Coronavirus 2 (CEPHEID - Performed in [facility] hospital lab), Hosp Order     Status: None   Collection Time: 08/20/18  9:32 PM  Result Value Ref Range   SARS Coronavirus 2 NEGATIVE NEGATIVE    Comment: (NOTE) If result is NEGATIVE SARS-CoV-2 target nucleic acids are NOT DETECTED. The SARS-CoV-2 RNA is generally detectable in upper and lower  respiratory specimens during the acute phase of infection. The lowest  concentration of SARS-CoV-2 viral copies this assay can detect is 250  copies / mL. A negative result does not preclude SARS-CoV-2 infection  and should not be used as the sole basis for treatment or other  patient management decisions.  A negative result may occur with  improper specimen collection / handling, submission of specimen other  than nasopharyngeal swab, presence of viral mutation(s) within the  areas targeted by this assay, and inadequate number of viral copies  (<250 copies / mL). A negative result must be combined with clinical   observations, patient history, and epidemiological information. If result is POSITIVE SARS-CoV-2 target nucleic acids are DETECTED. The SARS-CoV-2 RNA is generally detectable in upper and lower  respiratory specimens dur ing the acute phase of infection.  Positive  results are indicative of active infection with SARS-CoV-2.  Clinical  correlation with patient history and other  diagnostic information is  necessary to determine patient infection status.  Positive results do  not rule out bacterial infection or co-infection with other viruses. If result is PRESUMPTIVE POSTIVE SARS-CoV-2 nucleic acids MAY BE PRESENT.   A presumptive positive result was obtained on the submitted specimen  and confirmed on repeat testing.  While 2019 novel coronavirus  (SARS-CoV-2) nucleic acids may be present in the submitted sample  additional confirmatory testing may be necessary for epidemiological  and / or clinical management purposes  to differentiate between  SARS-CoV-2 and other Sarbecovirus currently known to infect humans.  If clinically indicated additional testing with an alternate test  methodology [phone]) is advised. The SARS-CoV-2 RNA is generally  detectable in upper and lower respiratory sp ecimens during the acute  phase of infection. The expected result is Negative. Fact Sheet for Patients:  BoilerBrush.com.cy Fact Sheet for Healthcare Providers: https://pope.com/ This test is not yet approved or cleared by the [place] FDA and has been authorized for detection and/or diagnosis of SARS-CoV-2 by FDA under an Emergency Use Authorization (EUA).  This EUA will remain in effect (meaning this test can be used) for the duration of the COVID-19 declaration under Section 564(b)(1) of the Act, 21 U.S.C. section 360bbb-3(b)(1), unless the authorization is terminated or revoked sooner. Performed at [facility], [address] [place], [place] [address]   Glucose, capillary     Status: Abnormal   Collection Time: 08/20/18  9:45 PM  Result Value Ref Range   Glucose-Capillary 502 (HH) 70 - 99 mg/dL   Comment 1 Document in Chart   Glucose, capillary     Status: Abnormal   Collection Time: 08/20/18 11:10 PM  Result Value Ref Range   Glucose-Capillary 374 (H) 70 - 99 mg/dL   Dg Chest Portable 1 View  Result Date: 08/20/2018 CLINICAL DATA:  48 year old male with chest pain and shortness of breath. EXAM: PORTABLE CHEST 1 VIEW COMPARISON:  Chest radiograph dated 05/05/2018 FINDINGS: There is mild cardiomegaly with vascular congestion. No significant edema. No pleural effusion or pneumothorax. No focal consolidation. Median sternotomy wires and CABG vascular clips. No acute osseous pathology. IMPRESSION: Cardiomegaly with mild vascular congestion. No focal consolidation. Electronically Signed   By: Elgie Collard M.D.   On: 08/20/2018 19:27    Pending Labs Unresulted Labs (From admission, onward)   None      Vitals/Pain Today's Vitals   08/20/18 2100 08/20/18 2200 08/20/18 2330 08/21/18 0000  BP: 131/89 (!) 143/82 (!) 143/92 (!) 138/95  Pulse: 77 78 76 77  Resp:      Temp:      TempSrc:      SpO2: 97% 98% 100% 99%  Weight:      Height:      PainSc:        Isolation Precautions No active isolations  Medications Medications  sodium chloride 0.9 % bolus 1,000 mL (0 mLs Intravenous Stopped 08/20/18 1959)  clopidogrel (PLAVIX) tablet 75 mg (has no administration in time range)  insulin aspart (novoLOG) injection 3 Units (has no administration in time range)  insulin detemir (LEVEMIR) injection 26 Units (has no administration in time range)  aspirin chewable tablet 81 mg (has no administration in time range)  atorvastatin (LIPITOR) tablet 80 mg (has no administration in time range)  furosemide (LASIX) tablet 40 mg (has no administration in time range)  losartan (COZAAR) tablet 25 mg (has no  administration in time range)  magnesium oxide (MAG-OX) tablet 400 mg (has  no administration in time range)  methadone (DOLOPHINE) tablet 130 mg (has no administration in time range)  metoprolol succinate (TOPROL-XL) 24 hr tablet 25 mg (has no administration in time range)  insulin aspart (novoLOG) injection 10 Units (10 Units Intravenous Given 08/20/18 2145)  alum & mag hydroxide-simeth (MAALOX/MYLANTA) 200-200-20 MG/5ML suspension 30 mL (30 mLs Oral Given 08/20/18 2129)    And  lidocaine (XYLOCAINE) 2 % viscous mouth solution 15 mL (15 mLs Oral Given 08/20/18 2129)    Mobility walks Low fall risk   Focused Assessments Cardiac Assessment Handoff:  Cardiac Rhythm: Normal sinus rhythm Lab Results  Component Value Date   TROPONINI <0.03 08/20/2018   No results found for: DDIMER Does the Patient currently have chest pain? No     R Recommendations: See Admitting Provider Note  Report given to:   Additional Notes:

## 2018-08-22 LAB — BASIC METABOLIC PANEL
Anion gap: 7 (ref 5–15)
BUN: 24 mg/dL — ABNORMAL HIGH (ref 6–20)
CO2: 29 mmol/L (ref 22–32)
Calcium: 8.9 mg/dL (ref 8.9–10.3)
Chloride: 101 mmol/L (ref 98–111)
Creatinine, Ser: 1.29 mg/dL — ABNORMAL HIGH (ref 0.61–1.24)
GFR calc Af Amer: >60 mL/min (ref >60)
GFR calc non Af Amer: >60 mL/min (ref >60)
Glucose, Bld: 156 mg/dL — ABNORMAL HIGH (ref 70–99)
Potassium: 4.3 mmol/L (ref 3.5–5.1)
Sodium: 137 mmol/L (ref 135–145)

## 2018-08-22 LAB — CBC
HCT: 32 % — ABNORMAL LOW (ref 39.0–52.0)
Hemoglobin: 10.2 g/dL — ABNORMAL LOW (ref 13.0–17.0)
MCH: 26.9 pg (ref 26.0–34.0)
MCHC: 31.9 g/dL (ref 30.0–36.0)
MCV: 84.4 fL (ref 80.0–100.0)
Platelets: 287 10*3/uL (ref 150–400)
RBC: 3.79 MIL/uL — ABNORMAL LOW (ref 4.22–5.81)
RDW: 14.9 % (ref 11.5–15.5)
WBC: 8.9 10*3/uL (ref 4.0–10.5)
nRBC: 0 % (ref 0.0–0.2)

## 2018-08-22 LAB — GLUCOSE, CAPILLARY
Glucose-Capillary: 158 mg/dL — ABNORMAL HIGH (ref 70–99)
Glucose-Capillary: 294 mg/dL — ABNORMAL HIGH (ref 70–99)

## 2018-08-22 MED ORDER — SPIRONOLACTONE 25 MG PO TABS
12.5000 mg | ORAL_TABLET | Freq: Every day | ORAL | Status: DC
  Administered 2018-08-22: 12.5 mg via ORAL
  Filled 2018-08-22: qty 0.5

## 2018-08-22 MED ORDER — SACUBITRIL-VALSARTAN 24-26 MG PO TABS
1.0000 | ORAL_TABLET | Freq: Two times a day (BID) | ORAL | Status: DC
  Administered 2018-08-22: 1 via ORAL
  Filled 2018-08-22: qty 1

## 2018-08-22 MED ORDER — FUROSEMIDE 40 MG PO TABS: 20.0000 mg | ORAL_TABLET | Freq: Two times a day (BID) | ORAL | 0 refills | Status: DC

## 2018-08-22 NOTE — Progress Notes (Signed)
SUBJECTIVE: Patient is less short of breath today   Vitals:   08/21/18 1642 08/21/18 2005 08/22/18 0517 08/22/18 0726  BP: 117/80 125/77 119/76 124/82  Pulse: 72 72 69 72  Resp: 16 16 20    Temp: (!) 97.4 F (36.3 C) 98.7 F (37.1 C) 98.5 F (36.9 C) 98.4 F (36.9 C)  TempSrc: Oral Oral Oral Oral  SpO2: 97% 98% 99% 100%  Weight:   66.3 kg   Height:        Intake/Output Summary (Last 24 hours) at 08/22/2018 0939 Last data filed at 08/22/2018 0913 Gross per 24 hour  Intake 726 ml  Output 2530 ml  Net -1804 ml    LABS: Basic Metabolic Panel: Recent Labs    08/21/18 0322 08/21/18 0727 08/22/18 0459  NA 134*  --  137  K 4.3  --  4.3  CL 98  --  101  CO2 27  --  29  GLUCOSE 401*  --  156*  BUN 23*  --  24*  CREATININE 1.14 1.17 1.29*  CALCIUM 8.8*  --  8.9   Liver Function Tests: No results for input(s): AST, ALT, ALKPHOS, BILITOT, PROT, ALBUMIN in the last 72 hours. No results for input(s): LIPASE, AMYLASE in the last 72 hours. CBC: Recent Labs    08/21/18 0727 08/22/18 0459  WBC 7.4 8.9  HGB 9.3* 10.2*  HCT 28.7* 32.0*  MCV 83.9 84.4  PLT 269 287   Cardiac Enzymes: Recent Labs    08/21/18 0727 08/21/18 1333 08/21/18 1920  TROPONINI 0.03* <0.03 <0.03   BNP: Invalid input(s): POCBNP D-Dimer: No results for input(s): DDIMER in the last 72 hours. Hemoglobin A1C: Recent Labs    08/21/18 0727  HGBA1C 14.0*   Fasting Lipid Panel: Recent Labs    08/21/18 0727  CHOL 134  HDL 54  LDLCALC 54  TRIG 130  CHOLHDL 2.5   Thyroid Function Tests: Recent Labs    08/21/18 0322  TSH 0.641   Anemia Panel: No results for input(s): VITAMINB12, FOLATE, FERRITIN, TIBC, IRON, RETICCTPCT in the last 72 hours.   PHYSICAL EXAM General: Well developed, well nourished, in no acute distress HEENT:  Normocephalic and atramatic Neck:  No JVD.  Lungs: Clear bilaterally to auscultation and percussion. Heart: HRRR . Normal S1 and S2 without gallops or murmurs.   Abdomen: Bowel sounds are positive, abdomen soft and non-tender  Msk:  Back normal, normal gait. Normal strength and tone for age. Extremities: No clubbing, cyanosis or edema.   Neuro: Alert and oriented X 3. Psych:  Good affect, responds appropriately  TELEMETRY: Sinus rhythm  ASSESSMENT AND PLAN: Congestive heart failure with severe LV dysfunction and coronary artery disease normally follows at [facility] and this is the second time he ended up in the hospital here.  Advise follow-up closely with our office and treat aggressively congestive heart failure.  Active Problems:   Acute CHF (HCC)   Chest pain   Acute on chronic combined systolic and diastolic CHF (congestive heart failure) (HCC)   Acute CHF (congestive heart failure) (HCC)    Jesse Christian A, MD, [facility] 08/22/2018 9:39 AM    Been there for a while

## 2018-08-22 NOTE — Plan of Care (Signed)
  Problem: Activity: Goal: Risk for activity intolerance will decrease Outcome: Progressing Note:  Up independently in room, tolerating well   Problem: Nutrition: Goal: Adequate nutrition will be maintained Outcome: Progressing   Problem: Coping: Goal: Level of anxiety will decrease Outcome: Progressing   Problem: Elimination: Goal: Will not experience complications related to bowel motility Outcome: Progressing Goal: Will not experience complications related to urinary retention Outcome: Progressing   Problem: Pain Managment: Goal: General experience of comfort will improve Outcome: Progressing Note:  No complaints of pain this shift   Problem: Safety: Goal: Ability to remain free from injury will improve Outcome: Progressing   Problem: Skin Integrity: Goal: Risk for impaired skin integrity will decrease Outcome: Progressing   Problem: Cardiac: Goal: Ability to achieve and maintain adequate cardiopulmonary perfusion will improve Outcome: Progressing Note:  Receiving IV lasix with good output

## 2018-08-22 NOTE — Discharge Summary (Signed)
[employer] [facility] [place] [facility]   PATIENT NAME: Jesse Christian    MR#:  [id number]  DATE OF BIRTH:  April 24, 1970  DATE OF ADMISSION:  08/20/2018 ADMITTING PHYSICIAN: Hannah Beat, MD  DATE OF DISCHARGE: 08/22/2018  PRIMARY CARE PHYSICIAN: Dorcas Carrow, DO    ADMISSION DIAGNOSIS:  Hyperglycemia [R73.9] Nonspecific chest pain [R07.9] Chest pain [R07.9]  DISCHARGE DIAGNOSIS:  Active Problems:   Acute CHF (HCC)   Chest pain   Acute on chronic combined systolic and diastolic CHF (congestive heart failure) (HCC)   Acute CHF (congestive heart failure) (HCC)   SECONDARY DIAGNOSIS:   Past Medical History:  Diagnosis Date  . Diabetes mellitus without complication (HCC)   . Drug abuse (HCC)   . Hyperlipidemia   . Right arm fracture 2018    HOSPITAL COURSE:    48 year old male with history of acute on chronic combined systolic and diastolic heart failure ejection fraction 25 to 30% who presents emergency room due to shortness of breath.  1.  Acute on chronic combined systolic and diastolic heart failure: Patient was diuresed with IV Lasix.  Patient is euvolemic.  Patient will continue on Lasix 20 mg p.o. twice daily in addition to his outpatient medications. He has a follow-up appointment next Friday for AICD placement with Dr. Excell Seltzer.  2.  Chest pain was due to problem #1: Patient was ruled out for ACS.  Troponins are negative.  3.  Diabetes: Continue outpatient regimen with ADA diet  4.  History of drug abuse on methadone    DISCHARGE CONDITIONS AND DIET:  Stable Heart healthy diabetic diet  CONSULTS OBTAINED:  Treatment Team:  Laurier Nancy, MD  DRUG ALLERGIES:   Allergies  Allergen Reactions  . Lyrica [Pregabalin] Other (See Comments)    Peripheral edema  . Metformin And Related Nausea And Vomiting    DISCHARGE MEDICATIONS:   Allergies as of 08/22/2018      Reactions   Lyrica [pregabalin] Other (See Comments)   Peripheral edema   Metformin And Related Nausea And Vomiting      Medication List    TAKE these medications   aspirin 81 MG chewable tablet Chew 81 mg by mouth daily.   atorvastatin 80 MG tablet Commonly known as:  LIPITOR Take 80 mg by mouth daily.   clopidogrel 75 MG tablet Commonly known as:  PLAVIX Take 1 tablet (75 mg total) by mouth daily.   furosemide 40 MG tablet Commonly known as:  LASIX Take 0.5 tablets (20 mg total) by mouth 2 (two) times daily. What changed:    how much to take  when to take this   insulin aspart 100 UNIT/ML injection Commonly known as:  novoLOG Inject 3 Units into the skin 3 (three) times daily with meals for 30 days.   insulin detemir 100 UNIT/ML injection Commonly known as:  LEVEMIR Inject 0.26 mLs (26 Units total) into the skin at bedtime for 30 days.   losartan 25 MG tablet Commonly known as:  COZAAR Take 25 mg by mouth daily.   magnesium oxide 400 MG tablet Commonly known as:  MAG-OX Take 400 mg by mouth daily.   methadone 10 MG tablet Commonly known as:  DOLOPHINE Take 130 mg by mouth daily.   metoprolol succinate 25 MG 24 hr tablet Commonly known as:  TOPROL-XL Take 25 mg by mouth daily.   ondansetron 4 MG tablet Commonly known as:  ZOFRAN Take 1 tablet (4 mg total) by mouth every  6 (six) hours as needed for nausea.         Today   CHIEF COMPLAINT:  Patient is doing well this morning.  Denies shortness of breath or chest pain.   VITAL SIGNS:  Blood pressure 124/82, pulse 72, temperature 98.4 F (36.9 C), temperature source Oral, resp. rate 20, height  (1.905 m), weight 66.3 kg, SpO2 100 %.   REVIEW OF SYSTEMS:  Review of Systems  Constitutional: Negative.  Negative for chills, fever and malaise/fatigue.  HENT: Negative.  Negative for ear discharge, ear pain, hearing loss, nosebleeds and sore throat.   Eyes: Negative.  Negative for blurred vision and pain.  Respiratory: Negative.  Negative for cough, hemoptysis,  shortness of breath and wheezing.   Cardiovascular: Negative.  Negative for chest pain, palpitations and leg swelling.  Gastrointestinal: Negative.  Negative for abdominal pain, blood in stool, diarrhea, nausea and vomiting.  Genitourinary: Negative.  Negative for dysuria.  Musculoskeletal: Negative.  Negative for back pain.  Skin: Negative.   Neurological: Negative for dizziness, tremors, speech change, focal weakness, seizures and headaches.  Endo/Heme/Allergies: Negative.  Does not bruise/bleed easily.  Psychiatric/Behavioral: Negative.  Negative for depression, hallucinations and suicidal ideas.     PHYSICAL EXAMINATION:  GENERAL:  48 y.o.-year-old patient lying in the bed with no acute distress.  NECK:  Supple, no jugular venous distention. No thyroid enlargement, no tenderness.  LUNGS: Normal breath sounds bilaterally, no wheezing, rales,rhonchi  No use of accessory muscles of respiration.  CARDIOVASCULAR: S1, S2 normal. No murmurs, rubs, or gallops.  ABDOMEN: Soft, non-tender, non-distended. Bowel sounds present. No organomegaly or mass.  EXTREMITIES: No pedal edema, cyanosis, or clubbing.  PSYCHIATRIC: The patient is alert and oriented x 3.  SKIN: No obvious rash, lesion, or ulcer.   DATA REVIEW:   CBC Recent Labs  Lab 08/22/18 0459  WBC 8.9  HGB 10.2*  HCT 32.0*  PLT 287    Chemistries  Recent Labs  Lab 08/22/18 0459  NA 137  K 4.3  CL 101  CO2 29  GLUCOSE 156*  BUN 24*  CREATININE 1.29*  CALCIUM 8.9    Cardiac Enzymes Recent Labs  Lab 08/21/18 0727 08/21/18 1333 08/21/18 1920  TROPONINI 0.03* <0.03 <0.03    Microbiology Results  @  RADIOLOGY:  Dg Chest Portable 1 View  Result Date: 08/20/2018 CLINICAL DATA:  48 year old male with chest pain and shortness of breath. EXAM: PORTABLE CHEST 1 VIEW COMPARISON:  Chest radiograph dated 05/05/2018 FINDINGS: There is mild cardiomegaly with vascular congestion. No significant edema. No pleural  effusion or pneumothorax. No focal consolidation. Median sternotomy wires and CABG vascular clips. No acute osseous pathology. IMPRESSION: Cardiomegaly with mild vascular congestion. No focal consolidation. Electronically Signed   By: Elgie Collard M.D.   On: 08/20/2018 19:27      Allergies as of 08/22/2018      Reactions   Lyrica [pregabalin] Other (See Comments)   Peripheral edema   Metformin And Related Nausea And Vomiting      Medication List    TAKE these medications   aspirin 81 MG chewable tablet Chew 81 mg by mouth daily.   atorvastatin 80 MG tablet Commonly known as:  LIPITOR Take 80 mg by mouth daily.   clopidogrel 75 MG tablet Commonly known as:  PLAVIX Take 1 tablet (75 mg total) by mouth daily.   furosemide 40 MG tablet Commonly known as:  LASIX Take 0.5 tablets (20 mg total) by mouth 2 (two) times daily.  What changed:    how much to take  when to take this   insulin aspart 100 UNIT/ML injection Commonly known as:  novoLOG Inject 3 Units into the skin 3 (three) times daily with meals for 30 days.   insulin detemir 100 UNIT/ML injection Commonly known as:  LEVEMIR Inject 0.26 mLs (26 Units total) into the skin at bedtime for 30 days.   losartan 25 MG tablet Commonly known as:  COZAAR Take 25 mg by mouth daily.   magnesium oxide 400 MG tablet Commonly known as:  MAG-OX Take 400 mg by mouth daily.   methadone 10 MG tablet Commonly known as:  DOLOPHINE Take 130 mg by mouth daily.   metoprolol succinate 25 MG 24 hr tablet Commonly known as:  TOPROL-XL Take 25 mg by mouth daily.   ondansetron 4 MG tablet Commonly known as:  ZOFRAN Take 1 tablet (4 mg total) by mouth every 6 (six) hours as needed for nausea.         Management plans discussed with the patient and he is in agreement. Stable for discharge home  Patient should follow up with dr Excell Seltzerbaker  CODE STATUS:     Code Status Orders  (From admission, onward)         Start      Ordered   08/21/18 0726  Full code  Continuous     08/21/18 0725        Code Status History    Date Active Date Inactive Code Status Order ID Comments User Context   08/21/2018 0222 08/21/2018 0725 Full Code [id number]  Pearletha AlfredSeals, Angela H, NP ED   07/14/2018 2020 07/16/2018 1645 Full Code [id number]  Ihor AustinPyreddy, Pavan, MD ED   05/06/2018 0504 05/12/2018 1435 Full Code [id number]  Arnaldo Nataliamond, Michael S, MD Inpatient   09/23/2017 1515 09/25/2017 1750 Full Code [id number]  Ramonita LabGouru, Aruna, MD Inpatient   12/26/2015 2110 12/27/2015 1954 Full Code [id number]  Altamese DillingVachhani, Vaibhavkumar, MD Inpatient      TOTAL TIME TAKING CARE OF THIS PATIENT: 38 minutes.    Note: This dictation was prepared with Dragon dictation along with smaller phrase technology. Any transcriptional errors that result from this process are unintentional.  Adrian SaranSital Kayn Haymore M.D on 08/22/2018 at 10:23 AM  Between 7am to 6pm - Pager - [phone] After 6pm go to [email or web address] - Social research officer, [email or web address] [email or web address] [email or web address]  [facility] [facility]  Office  [phone]  CC: Primary care physician; Dorcas CarrowJohnson, [name], DO

## 2018-08-22 NOTE — Progress Notes (Signed)
Pt discharged to home via wc.  Instructions and rx given to pt.  Questions answered.  No distress.  

## 2018-08-26 ENCOUNTER — Telehealth: Payer: Self-pay

## 2018-08-26 NOTE — Telephone Encounter (Signed)
   TELEPHONE CALL NOTE  This patient has been deemed a candidate for follow-up tele-health visit to limit community exposure during the Covid-19 pandemic. I spoke with the patient via phone to discuss instructions. The patient was advised to review the section on consent for treatment as well. The patient will receive a phone call 2-3 days prior to their E-Visit at which time consent will be verbally confirmed. A Virtual Office Visit appointment type has been scheduled for 08/28/2018 with [facility].  Nira Retort L, CMA 08/26/2018 1:08 PM

## 2018-08-26 NOTE — Telephone Encounter (Signed)
TELEPHONE CALL NOTE  DUGLAS DADO has been deemed a candidate for a follow-up tele-health visit to limit community exposure during the Covid-19 pandemic. I spoke with the patient via phone to ensure availability of phone/video source, confirm preferred email & phone number, discuss instructions and expectations, and review consent.   I reminded KAZUO DURAY to be prepared with any vital sign and/or heart rhythm information that could potentially be obtained via home monitoring, at the time of his visit.  Finally, I reminded ABAS SGRO to expect an e-mail containing a link for their video-based visit approximately 15 minutes before his visit, or alternatively, a phone call at the time of his visit if his visit is planned to be a phone encounter.  Did the patient verbally consent to treatment as below? YES  Reniyah Gootee L, CMA 08/26/2018 1:08 PM  CONSENT FOR TELE-HEALTH VISIT - PLEASE REVIEW  I hereby voluntarily request, consent and authorize [facility] and its employed or contracted physicians, physician assistants, nurse practitioners or other licensed health care professionals (the Practitioner), to provide me with telemedicine health care services (the "Services") as deemed necessary by the treating Practitioner. I acknowledge and consent to receive the Services by the Practitioner via telemedicine. I understand that the telemedicine visit will involve communicating with the Practitioner through telephonic communication technology and the disclosure of certain medical information by electronic transmission. I acknowledge that I have been given the opportunity to request an in-person assessment or other available alternative prior to the telemedicine visit and am voluntarily participating in the telemedicine visit.  I understand that I have the right to withhold or withdraw my consent to the use of telemedicine in the course of my care at any time, without affecting my  right to future care or treatment, and that the Practitioner or I may terminate the telemedicine visit at any time. I understand that I have the right to inspect all information obtained and/or recorded in the course of the telemedicine visit and may receive copies of available information for a reasonable fee.  I understand that some of the potential risks of receiving the Services via telemedicine include:  Marland Kitchen Delay or interruption in medical evaluation due to technological equipment failure or disruption; . Information transmitted may not be sufficient (e.g. poor resolution of images) to allow for appropriate medical decision making by the Practitioner; and/or  . In rare instances, security protocols could fail, causing a breach of personal health information.  Furthermore, I acknowledge that it is my responsibility to provide information about my medical history, conditions and care that is complete and accurate to the best of my ability. I acknowledge that Practitioner's advice, recommendations, and/or decision may be based on factors not within their control, such as incomplete or inaccurate data provided by me or lack of visual representation. I understand that the practice of medicine is not an exact science and that Practitioner makes no warranties or guarantees regarding treatment outcomes. I acknowledge that I will receive a copy of this consent concurrently upon execution via email to the email address I last provided but may also request a printed copy by calling the office of [facility].    I understand that my insurance may be billed for this visit.   I have read or had this consent read to me. . I understand the contents of this consent, which adequately explains the benefits and risks of the Services being provided via telemedicine.  Marland Kitchen  I have been provided ample opportunity to ask questions regarding this consent and the Services and have had my questions answered to my  satisfaction. . I give my informed consent for the services to be provided through the use of telemedicine in my medical care  By participating in this telemedicine visit I agree to the above.

## 2018-08-27 ENCOUNTER — Inpatient Hospital Stay: Payer: Self-pay | Admitting: Family Medicine

## 2018-08-28 ENCOUNTER — Ambulatory Visit: Payer: 59 | Attending: Family | Admitting: Family

## 2018-08-28 ENCOUNTER — Other Ambulatory Visit: Payer: Self-pay

## 2018-08-28 DIAGNOSIS — I5022 Chronic systolic (congestive) heart failure: Secondary | ICD-10-CM

## 2018-08-28 DIAGNOSIS — E1365 Other specified diabetes mellitus with hyperglycemia: Secondary | ICD-10-CM

## 2018-08-28 NOTE — Patient Instructions (Signed)
Begin weighing daily and call for an overnight weight gain of > 2 pounds or a weekly weight gain of >5 pounds. 

## 2018-08-28 NOTE — Progress Notes (Signed)
Virtual Visit via Telephone Note    Evaluation Performed:  Initial visit  This visit type was conducted due to national recommendations for restrictions regarding the COVID-19 Pandemic (e.g. social distancing).  This format is felt to be most appropriate for this patient at this time.  All issues noted in this document were discussed and addressed.  No physical exam was performed (except for noted visual exam findings with Video Visits).  Please refer to the patient's chart (MyChart message for video visits and phone note for telephone visits) for the patient's consent to telehealth for [facility]  Date:  08/28/2018   ID:  Jesse Christian, DOB 25-Oct-1970, MRN [id number]  Patient Location:  [address] [place] [address]   Provider location:   [facility] [address] [place], [place] [address]  PCP:  Dorcas Carrow, DO  Cardiologist:  [facility] cardiology Electrophysiologist:  None   Chief Complaint: Fatigue  History of Present Illness:    Jesse Christian is a 48 y.o. male who presents via audio/video conferencing for a telehealth visit today.  Patient verified DOB and address.  The patient does not have symptoms concerning for COVID-19 infection (fever, chills, cough, or new SHORTNESS OF BREATH).   Patient reports minimal fatigue upon moderate exertion. He says that this has been present for several months. He has associated dizziness along with this. He denies any swelling in his legs/ abdomen, palpitations, chest pain, shortness of breath, cough or difficulty sleeping. He currently doesn't have scales but is planning on getting some today. Has been having issues with hyperglycemia with glucose levels >500.   Prior CV studies:   The following studies were reviewed today:  Echo report from 05/06/2018 reviewed and showed an EF of 20% along with minimal MR.   Catheterization done 05/06/2018 which revealed 2 vessel disease.   Past Medical  History:  Diagnosis Date  . Diabetes mellitus without complication (HCC)   . Drug abuse (HCC)   . Hyperlipidemia   . Right arm fracture 2018   Past Surgical History:  Procedure Laterality Date  . CARDIAC SURGERY    . CARDIAC SURGERY  05/2018   double bypass  . LEFT HEART CATH AND CORONARY ANGIOGRAPHY Right 05/06/2018   Procedure: LEFT HEART CATH AND CORONARY ANGIOGRAPHY;  Surgeon: Laurier Nancy, MD;  Location: [facility] INVASIVE CV LAB;  Service: Cardiovascular;  Laterality: Right;     Current Meds  Medication Sig  . aspirin 81 MG chewable tablet Chew 81 mg by mouth daily.   Marland Kitchen atorvastatin (LIPITOR) 80 MG tablet Take 80 mg by mouth daily.  . clopidogrel (PLAVIX) 75 MG tablet Take 1 tablet (75 mg total) by mouth daily.  . dapagliflozin propanediol (FARXIGA) 10 MG TABS tablet Take 10 mg by mouth daily.  . furosemide (LASIX) 40 MG tablet Take 0.5 tablets (20 mg total) by mouth 2 (two) times daily.  . insulin detemir (LEVEMIR) 100 UNIT/ML injection Inject 30 Units into the skin daily.  Marland Kitchen losartan (COZAAR) 25 MG tablet Take 25 mg by mouth daily.  . magnesium oxide (MAG-OX) 400 MG tablet Take 400 mg by mouth daily.  . methadone (DOLOPHINE) 10 MG tablet Take 130 mg by mouth daily.   . metoprolol succinate (TOPROL-XL) 25 MG 24 hr tablet Take 25 mg by mouth daily.  . ondansetron (ZOFRAN) 4 MG tablet Take 1 tablet (4 mg total) by mouth every 6 (six) hours as needed for nausea.  Allergies:   Lyrica [pregabalin] and Metformin and related   Social History   Tobacco Use  . Smoking status: Former Smoker    Packs/day: 0.50    Types: Cigarettes  . Smokeless tobacco: Never Used  Substance Use Topics  . Alcohol use: No  . Drug use: No     Family Hx: The patient's family history includes Cancer in his maternal aunt; Heart attack in his father; Heart disease in his father; Mental illness in his mother and sister. There is no history of Prostate cancer, Bladder Cancer, or Kidney  cancer.  ROS:   Please see the history of present illness.     All other systems reviewed and are negative.   Labs/Other Tests and Data Reviewed:    Recent Labs: 07/15/2018: ALT 22 07/16/2018: Magnesium 1.8 08/20/2018: B Natriuretic Peptide 1,168.0 08/21/2018: TSH 0.641 08/22/2018: BUN 24; Creatinine, Ser 1.29; Hemoglobin 10.2; Platelets 287; Potassium 4.3; Sodium 137   Recent Lipid Panel Lab Results  Component Value Date/Time   CHOL 134 08/21/2018 07:27 AM   CHOL 146 07/01/2018 02:23 PM   CHOL 170 02/04/2016 09:05 AM   TRIG 130 08/21/2018 07:27 AM   TRIG 311 (H) 02/04/2016 09:05 AM   HDL 54 08/21/2018 07:27 AM   HDL 51 07/01/2018 02:23 PM   CHOLHDL 2.5 08/21/2018 07:27 AM   LDLCALC 54 08/21/2018 07:27 AM   LDLCALC 51 07/01/2018 02:23 PM    Wt Readings from Last 3 Encounters:  08/22/18 146 lb 3.2 oz (66.3 kg)  07/14/18 140 lb (63.5 kg)  07/01/18 149 lb (67.6 kg)     Exam:    Vital Signs:  There were no vitals taken for this visit.   Well nourished, well developed male in no  acute distress.   ASSESSMENT & PLAN:    1. Chronic heart failure with reduced ejection fraction- - NYHA class II - euvolemic based on patient's description of symptoms - not weighing daily as he doesn't have any scales but he says that he's going to get some scales today. Instructed him to call for an overnight weight gain of >2 pounds or a weekly weight gain of >5 pounds - not adding salt to his food and tries to look at food labels so that he can closely follow a  sodium diet - saw [facility] cardiology 5/52020 and is having ICD placed 08/30/2018 - consider changing losartan to entresto or titrating up metoprolol succinate at future visit when close BP/ lab monitoring can be done - had CABG done Feb 2020 - BNP 08/20/2018 was 1168.0  2: Diabetes-  - glucose this morning was >500 - he has placed a call in to his endocrinologist  - saw endocrinology ([facility]) 08/26/2018 - saw PCP Laural Benes)  07/23/2018 - BMP on 08/22/2018 reviewed and showed sodium 137, potassium 4.3, creatinine 1.29 and GFR >60 - A1c 08/21/2018 was 14.0%  COVID-19 Education: The signs and symptoms of COVID-19 were discussed with the patient and how to seek care for testing (follow up with PCP or arrange E-visit).  The importance of social distancing was discussed today.  Patient Risk:   After full review of this patients clinical status, I feel that they are at least moderate risk at this time.  Time:   Today, I have spent 6 minutes with the patient with telehealth technology discussing diet, medications and symptoms to report.     Medication Adjustments/Labs and Tests Ordered: Current medicines are reviewed at length with the patient today.  Concerns regarding  medicines are outlined above.   Tests Ordered: No orders of the defined types were placed in this encounter.  Medication Changes: No orders of the defined types were placed in this encounter.   Disposition:  Follow-up in 6 weeks or sooner for any questions/problems before then.   Signed, Delma Freezeina A Hackney, FNP  08/28/2018 9:58 AM    [facility]

## 2018-09-06 ENCOUNTER — Ambulatory Visit (INDEPENDENT_AMBULATORY_CARE_PROVIDER_SITE_OTHER): Payer: Self-pay | Admitting: Family Medicine

## 2018-09-06 ENCOUNTER — Encounter: Payer: Self-pay | Admitting: Family Medicine

## 2018-09-06 ENCOUNTER — Other Ambulatory Visit: Payer: Self-pay

## 2018-09-06 VITALS — Ht 72.0 in | Wt 155.0 lb

## 2018-09-06 DIAGNOSIS — Z794 Long term (current) use of insulin: Secondary | ICD-10-CM

## 2018-09-06 DIAGNOSIS — I5043 Acute on chronic combined systolic (congestive) and diastolic (congestive) heart failure: Secondary | ICD-10-CM

## 2018-09-06 DIAGNOSIS — F064 Anxiety disorder due to known physiological condition: Secondary | ICD-10-CM

## 2018-09-06 DIAGNOSIS — E1365 Other specified diabetes mellitus with hyperglycemia: Secondary | ICD-10-CM

## 2018-09-06 DIAGNOSIS — R112 Nausea with vomiting, unspecified: Secondary | ICD-10-CM

## 2018-09-06 MED ORDER — SUCRALFATE 1 GM/10ML PO SUSP: 1.0000 g | Freq: Three times a day (TID) | ORAL | 0 refills | Status: DC

## 2018-09-06 MED ORDER — ONDANSETRON HCL 4 MG PO TABS: 4.0000 mg | ORAL_TABLET | Freq: Four times a day (QID) | ORAL | 3 refills | Status: DC | PRN

## 2018-09-06 MED ORDER — HYDROXYZINE HCL 25 MG PO TABS: 25.0000 mg | ORAL_TABLET | Freq: Three times a day (TID) | ORAL | 3 refills | Status: DC | PRN

## 2018-09-06 NOTE — Assessment & Plan Note (Signed)
Not interested in daily medicine. Will try hydroxyzine and recheck in 1 month. Call with any concerns. Continue to monitor. Call with any concerns.

## 2018-09-06 NOTE — Assessment & Plan Note (Signed)
Doing well. Following with cardiology. Has his ICD implanted. No other concerns or complaints at this time.

## 2018-09-06 NOTE — Assessment & Plan Note (Signed)
Still not doing great, but starting to improve. Continue to follow with endocrinology. Call with any concerns.

## 2018-09-06 NOTE — Progress Notes (Signed)
Ht 6' (1.829 m)   Wt 155 lb (70.3 kg)   BMI 21.02 kg/m    Subjective:    Patient ID: Jesse Christian, male    DOB: November 19, 1970, 48 y.o.   MRN: [id number]  HPI: Jesse Christian is a 48 y.o. male  Chief Complaint  Patient presents with  . Diabetes    f/u   Saw Dr. Tedd Christian on 08/07/18 for his diabetes- he was changed from levemir to tresiba and has a sliding scale of novolog for meal times  Saw his cardiologist via telemedicine on 08/13/18- had been doing well, to have ECHO- had been lightheaded, so lasix was decreased. He was to have a ICD placement  Hospitalized on 5/12-14/20 with acute CHF- lasix increased back up to BID   Transition of Care Hospital Follow up.   Hospital/Facility: [facility] D/C Physician: Dr. Juliene Christian D/C Date: 08/22/18  Records Requested: 09/06/18  Records Received: 09/06/18  Records Reviewed: 09/06/18   Diagnoses on Discharge: Acute CHF (HCC)   Chest pain   Acute on chronic combined systolic and diastolic CHF (congestive heart failure) (HCC)   Acute CHF (congestive heart failure) (HCC)  Date of interactive Contact within 48 hours of discharge: NOT DONE  Date of 7 day or 14 day face-to-face visit:  09/06/18  within 14 days  Outpatient Encounter Medications as of 09/06/2018  Medication Sig  . aspirin 81 MG chewable tablet Chew 81 mg by mouth daily.   Marland Kitchen atorvastatin (LIPITOR) 80 MG tablet Take 80 mg by mouth daily.  . dapagliflozin propanediol (FARXIGA) 10 MG TABS tablet Take 10 mg by mouth daily.  . furosemide (LASIX) 40 MG tablet Take 0.5 tablets (20 mg total) by mouth 2 (two) times daily.  . insulin detemir (LEVEMIR) 100 UNIT/ML injection Inject 30 Units into the skin daily.  Marland Kitchen losartan (COZAAR) 25 MG tablet Take 25 mg by mouth daily.  . magnesium oxide (MAG-OX) 400 MG tablet Take 400 mg by mouth daily.  . methadone (DOLOPHINE) 10 MG tablet Take 130 mg by mouth daily.   . metoprolol succinate (TOPROL-XL) 25 MG 24 hr tablet Take 25 mg by mouth daily.  .  ondansetron (ZOFRAN) 4 MG tablet Take 1 tablet (4 mg total) by mouth every 6 (six) hours as needed for nausea.  . [DISCONTINUED] clopidogrel (PLAVIX) 75 MG tablet Take 1 tablet (75 mg total) by mouth daily.  . [DISCONTINUED] ondansetron (ZOFRAN) 4 MG tablet Take 1 tablet (4 mg total) by mouth every 6 (six) hours as needed for nausea.  . hydrOXYzine (ATARAX/VISTARIL) 25 MG tablet Take 1 tablet (25 mg total) by mouth 3 (three) times daily as needed.  . insulin aspart (NOVOLOG) 100 UNIT/ML injection Inject 3 Units into the skin 3 (three) times daily with meals for 30 days.  . sucralfate (CARAFATE) 1 GM/10ML suspension Take 10 mLs (1 g total) by mouth 4 (four) times daily -  with meals and at bedtime.   No facility-administered encounter medications on file as of 09/06/2018.   Per Hospitalist: " HOSPITAL COURSE:    48 year old male with history of acute on chronic combined systolic and diastolic heart failure ejection fraction 25 to 30% who presents emergency room due to shortness of breath.  1.  Acute on chronic combined systolic and diastolic heart failure: Patient was diuresed with IV Lasix.  Patient is euvolemic.  Patient will continue on Lasix 20 mg p.o. twice daily in addition to his outpatient medications. He has a follow-up appointment next Friday  for AICD placement with Dr. Excell Christian.  2.  Chest pain was due to problem #1: Patient was ruled out for ACS.  Troponins are negative.  3.  Diabetes: Continue outpatient regimen with ADA diet  4.  History of drug abuse on methadone""   Diagnostic Tests Reviewed:   CLINICAL DATA:  48 year old male with chest pain and shortness of breath.  EXAM: PORTABLE CHEST 1 VIEW  COMPARISON:  Chest radiograph dated 05/05/2018  FINDINGS: There is mild cardiomegaly with vascular congestion. No significant edema. No pleural effusion or pneumothorax. No focal consolidation. Median sternotomy wires and CABG vascular clips. No acute osseous  pathology.  IMPRESSION: Cardiomegaly with mild vascular congestion. No focal consolidation.  Disposition: Home  Consults: Cardiology  Discharge Instructions:  Follow up with cardiology and here  Disease/illness Education: Given in writing  Home Health/Community Services Discussions/Referrals: N/A  Establishment or re-establishment of referral orders for community resources: N/A  Discussion with other health care providers: None  Assessment and Support of treatment regimen adherence: Fair  Appointments Coordinated with:  Patient  Education for self-management, independent living, and ADLs: Given in writing  Followed up with Dr. Tedd Christian 08/26/18- started on farxiga. [name] changed to toujeo due to cost. He notes that his sugars have been doing better and he likes the new meter.   [facility] with [facility] on 5/20  Had ICD [facility] at [facility] 08/30/18, he is feeling well with that. No chest pain.   Jesse Christian notes that he is still not feeling well. He notes that something is making him feel sick. He notes that he feels sick for the whole day. Starts burping and throwing up. He is then nauseous and has diarrhea for the whole day. Tends to last 1-2 days. No benefit from OTC medication.  Has been having a lot more anxiety recently. Has been having a lot of problems with stress. Has taken many other medicines in the past without benefit. Felt like klonopin helped for a short period of time. He doesn't want to take a daily medicine at this time. No other concerns or complaints at this time.   Relevant past medical, surgical, family and social history reviewed and updated as indicated. Interim medical history since our last visit reviewed. Allergies and medications reviewed and updated.  Review of Systems  Constitutional: Negative.   HENT: Negative.   Respiratory: Negative.   Cardiovascular: Negative.   Gastrointestinal: Positive for diarrhea, nausea and vomiting. Negative for  abdominal distention, abdominal pain, anal bleeding, blood in stool, constipation and rectal pain.  Genitourinary: Negative.   Musculoskeletal: Negative.   Skin: Negative.   Neurological: Negative.   Psychiatric/Behavioral: Positive for dysphoric mood. Negative for agitation, behavioral problems, confusion, decreased concentration, hallucinations, self-injury, sleep disturbance and suicidal ideas. The patient is nervous/anxious. The patient is not hyperactive.     Per HPI unless specifically indicated above     Objective:    Ht 6' (1.829 m)   Wt 155 lb (70.3 kg)   BMI 21.02 kg/m   Wt Readings from Last 3 Encounters:  09/06/18 155 lb (70.3 kg)  08/22/18 146 lb 3.2 oz (66.3 kg)  07/14/18 140 lb (63.5 kg)    Physical Exam Vitals signs and nursing note reviewed.  Pulmonary:     Effort: Pulmonary effort is normal. No respiratory distress.     Comments: Speaking in full sentences Neurological:     Mental Status: He is alert.  Psychiatric:        Mood and Affect:  Mood normal.        Behavior: Behavior normal.        Thought Content: Thought content normal.        Judgment: Judgment normal.     Results for orders placed or performed in visit on 09/06/18  HM DIABETES EYE EXAM  Result Value Ref Range   HM Diabetic Eye Exam Retinopathy (A) No Retinopathy      Assessment & Plan:   Problem List Items Addressed This Visit      Cardiovascular and Mediastinum   Acute on chronic combined systolic and diastolic CHF (congestive heart failure) (HCC)    Doing well. Following with cardiology. Has his ICD implanted. No other concerns or complaints at this time.         Endocrine   Diabetes mellitus with hyperglycemia, with long-term current use of insulin (HCC)    Still not doing great, but starting to improve. Continue to follow with endocrinology. Call with any concerns.       Relevant Orders   Ambulatory referral to Gastroenterology     Other   Anxiety disorder due to  medical condition    Not interested in daily medicine. Will try hydroxyzine and recheck in 1 month. Call with any concerns. Continue to monitor. Call with any concerns.       Relevant Medications   hydrOXYzine (ATARAX/VISTARIL) 25 MG tablet    Other Visit Diagnoses    Nausea and vomiting, intractability of vomiting not specified, unspecified vomiting type    -  Primary   Concern for gastroparesis. Will start carafate and continue zofran and get him into see GI. Call with any concerns. Continue to monitor.    Relevant Orders   Ambulatory referral to Gastroenterology       Follow up plan: Return in about 4 weeks (around 10/04/2018).    . This visit was completed via telephone due to the restrictions of the COVID-19 pandemic. All issues as above were discussed and addressed but no physical exam was performed. If it was felt that the patient should be evaluated in the office, they were directed there. The patient verbally consented to this visit. Patient was unable to complete an audio/visual visit due to Lack of equipment. Due to the catastrophic nature of the COVID-19 pandemic, this visit was done through audio contact only. . Location of the patient: work . Location of the provider: work . Those involved with this call:  . Provider: Olevia PerchesMegan Cadie Sorci, DO . CMA: Elton SinAnita Quito, CMA . Front Desk/Registration: Adela Portshristan Williamson  . Time spent on call: 25 minutes on the phone discussing health concerns. 40 minutes total spent in review of patient's record and preparation of their chart.

## 2018-09-17 DIAGNOSIS — R739 Hyperglycemia, unspecified: Secondary | ICD-10-CM | POA: Diagnosis present

## 2018-09-19 MED ORDER — ATORVASTATIN CALCIUM 80 MG PO TABS
80.00 | ORAL_TABLET | ORAL | Status: DC
Start: 2018-09-19 — End: 2018-09-19

## 2018-09-19 MED ORDER — LOSARTAN POTASSIUM 25 MG PO TABS
12.50 | ORAL_TABLET | ORAL | Status: DC
Start: 2018-09-20 — End: 2018-09-19

## 2018-09-19 MED ORDER — ALUM & MAG HYDROXIDE-SIMETH 400-400-40 MG/5ML PO SUSP
30.00 | ORAL | Status: DC
Start: ? — End: 2018-09-19

## 2018-09-19 MED ORDER — MELATONIN 3 MG PO TABS
3.00 | ORAL_TABLET | ORAL | Status: DC
Start: ? — End: 2018-09-19

## 2018-09-19 MED ORDER — DEXTROSE 50 % IV SOLN
12.50 | INTRAVENOUS | Status: DC
Start: ? — End: 2018-09-19

## 2018-09-19 MED ORDER — INSULIN LISPRO 100 UNIT/ML ~~LOC~~ SOLN
0.00 | SUBCUTANEOUS | Status: DC
Start: 2018-09-19 — End: 2018-09-19

## 2018-09-19 MED ORDER — METHADONE HCL 10 MG PO TABS
130.00 | ORAL_TABLET | ORAL | Status: DC
Start: 2018-09-20 — End: 2018-09-19

## 2018-09-19 MED ORDER — GENERIC EXTERNAL MEDICATION
2.00 | Status: DC
Start: 2018-09-19 — End: 2018-09-19

## 2018-09-19 MED ORDER — ONDANSETRON HCL 8 MG PO TABS
4.00 | ORAL_TABLET | ORAL | Status: DC
Start: 2018-09-19 — End: 2018-09-19

## 2018-09-19 MED ORDER — GENERIC EXTERNAL MEDICATION
Status: DC
Start: ? — End: 2018-09-19

## 2018-09-19 MED ORDER — MAGNESIUM OXIDE 400 MG PO TABS
400.00 | ORAL_TABLET | ORAL | Status: DC
Start: 2018-09-20 — End: 2018-09-19

## 2018-09-19 MED ORDER — INSULIN LISPRO 100 UNIT/ML ~~LOC~~ SOLN
4.00 | SUBCUTANEOUS | Status: DC
Start: 2018-09-19 — End: 2018-09-19

## 2018-09-19 MED ORDER — METOPROLOL SUCCINATE ER 25 MG PO TB24
25.00 | ORAL_TABLET | ORAL | Status: DC
Start: 2018-09-20 — End: 2018-09-19

## 2018-09-19 MED ORDER — POLYETHYLENE GLYCOL 3350 17 G PO PACK
17.00 | PACK | ORAL | Status: DC
Start: ? — End: 2018-09-19

## 2018-09-19 MED ORDER — ASPIRIN 81 MG PO CHEW
81.00 | CHEWABLE_TABLET | ORAL | Status: DC
Start: 2018-09-20 — End: 2018-09-19

## 2018-09-19 MED ORDER — INSULIN GLARGINE 100 UNIT/ML ~~LOC~~ SOLN
30.00 | SUBCUTANEOUS | Status: DC
Start: 2018-09-19 — End: 2018-09-19

## 2018-09-19 MED ORDER — ACETAMINOPHEN 325 MG PO TABS
650.00 | ORAL_TABLET | ORAL | Status: DC
Start: ? — End: 2018-09-19

## 2018-10-02 MED ORDER — ENOXAPARIN SODIUM 40 MG/0.4ML ~~LOC~~ SOLN
40.00 | SUBCUTANEOUS | Status: DC
Start: 2018-10-02 — End: 2018-10-02

## 2018-10-02 MED ORDER — METOPROLOL SUCCINATE ER 25 MG PO TB24
25.00 | ORAL_TABLET | ORAL | Status: DC
Start: 2018-10-03 — End: 2018-10-02

## 2018-10-02 MED ORDER — INSULIN GLARGINE 100 UNIT/ML ~~LOC~~ SOLN
30.00 | SUBCUTANEOUS | Status: DC
Start: 2018-10-02 — End: 2018-10-02

## 2018-10-02 MED ORDER — ACETAMINOPHEN 325 MG PO TABS
650.00 | ORAL_TABLET | ORAL | Status: DC
Start: ? — End: 2018-10-02

## 2018-10-02 MED ORDER — INSULIN LISPRO 100 UNIT/ML ~~LOC~~ SOLN
8.00 | SUBCUTANEOUS | Status: DC
Start: 2018-10-02 — End: 2018-10-02

## 2018-10-02 MED ORDER — MELATONIN 3 MG PO TABS
3.00 | ORAL_TABLET | ORAL | Status: DC
Start: ? — End: 2018-10-02

## 2018-10-02 MED ORDER — DEXTROSE 50 % IV SOLN
12.50 | INTRAVENOUS | Status: DC
Start: ? — End: 2018-10-02

## 2018-10-02 MED ORDER — METOCLOPRAMIDE HCL 10 MG PO TABS
10.00 | ORAL_TABLET | ORAL | Status: DC
Start: ? — End: 2018-10-02

## 2018-10-02 MED ORDER — ATORVASTATIN CALCIUM 80 MG PO TABS
80.00 | ORAL_TABLET | ORAL | Status: DC
Start: 2018-10-02 — End: 2018-10-02

## 2018-10-02 MED ORDER — LOSARTAN POTASSIUM 25 MG PO TABS
12.50 | ORAL_TABLET | ORAL | Status: DC
Start: 2018-10-03 — End: 2018-10-02

## 2018-10-02 MED ORDER — POLYETHYLENE GLYCOL 3350 17 G PO PACK
17.00 | PACK | ORAL | Status: DC
Start: 2018-10-03 — End: 2018-10-02

## 2018-10-02 MED ORDER — GENERIC EXTERNAL MEDICATION
12.50 | Status: DC
Start: ? — End: 2018-10-02

## 2018-10-02 MED ORDER — INSULIN LISPRO 100 UNIT/ML ~~LOC~~ SOLN
0.00 | SUBCUTANEOUS | Status: DC
Start: 2018-10-02 — End: 2018-10-02

## 2018-10-02 MED ORDER — CHOLECALCIFEROL 25 MCG (1000 UT) PO TABS
5000.00 | ORAL_TABLET | ORAL | Status: DC
Start: 2018-10-03 — End: 2018-10-02

## 2018-10-02 MED ORDER — ONDANSETRON 4 MG PO TBDP
8.00 | ORAL_TABLET | ORAL | Status: DC
Start: 2018-10-02 — End: 2018-10-02

## 2018-10-02 MED ORDER — GENERIC EXTERNAL MEDICATION
2.00 | Status: DC
Start: 2018-10-02 — End: 2018-10-02

## 2018-10-02 MED ORDER — METHADONE HCL 10 MG PO TABS
130.00 | ORAL_TABLET | ORAL | Status: DC
Start: 2018-10-03 — End: 2018-10-02

## 2018-10-02 MED ORDER — GENERIC EXTERNAL MEDICATION
Status: DC
Start: ? — End: 2018-10-02

## 2018-10-02 MED ORDER — FAMOTIDINE 20 MG PO TABS
20.00 | ORAL_TABLET | ORAL | Status: DC
Start: 2018-10-02 — End: 2018-10-02

## 2018-10-02 MED ORDER — ASPIRIN 81 MG PO CHEW
81.00 | CHEWABLE_TABLET | ORAL | Status: DC
Start: 2018-10-03 — End: 2018-10-02

## 2018-10-09 ENCOUNTER — Telehealth: Payer: Self-pay

## 2018-10-09 NOTE — Telephone Encounter (Signed)
TELEPHONE CALL NOTE  Jesse Christian has been deemed a candidate for a follow-up tele-health visit to limit community exposure during the Covid-19 pandemic. I spoke with the patient via phone to ensure availability of phone/video source, confirm preferred email & phone number, discuss instructions and expectations, and review consent.   I reminded Jesse Christian to be prepared with any vital sign and/or heart rhythm information that could potentially be obtained via home monitoring, at the time of his visit.  Finally, I reminded Jesse Christian to expect an e-mail containing a link for their video-based visit approximately 15 minutes before his visit, or alternatively, a phone call at the time of his visit if his visit is planned to be a phone encounter.  Did the patient verbally consent to treatment as below? YES  Gaylord Shih, CMA 10/09/2018 1:44 PM  CONSENT FOR TELE-HEALTH VISIT - PLEASE REVIEW  I hereby voluntarily request, consent and authorize [facility] and its employed or contracted physicians, physician assistants, nurse practitioners or other licensed health care professionals (the Practitioner), to provide me with telemedicine health care services (the "Services") as deemed necessary by the treating Practitioner. I acknowledge and consent to receive the Services by the Practitioner via telemedicine. I understand that the telemedicine visit will involve communicating with the Practitioner through telephonic communication technology and the disclosure of certain medical information by electronic transmission. I acknowledge that I have been given the opportunity to request an in-person assessment or other available alternative prior to the telemedicine visit and am voluntarily participating in the telemedicine visit.  I understand that I have the right to withhold or withdraw my consent to the use of telemedicine in the course of my care at any time, without affecting my  right to future care or treatment, and that the Practitioner or I may terminate the telemedicine visit at any time. I understand that I have the right to inspect all information obtained and/or recorded in the course of the telemedicine visit and may receive copies of available information for a reasonable fee.  I understand that some of the potential risks of receiving the Services via telemedicine include:  Marland Kitchen Delay or interruption in medical evaluation due to technological equipment failure or disruption; . Information transmitted may not be sufficient (e.g. poor resolution of images) to allow for appropriate medical decision making by the Practitioner; and/or  . In rare instances, security protocols could fail, causing a breach of personal health information.  Furthermore, I acknowledge that it is my responsibility to provide information about my medical history, conditions and care that is complete and accurate to the best of my ability. I acknowledge that Practitioner's advice, recommendations, and/or decision may be based on factors not within their control, such as incomplete or inaccurate data provided by me or lack of visual representation. I understand that the practice of medicine is not an exact science and that Practitioner makes no warranties or guarantees regarding treatment outcomes. I acknowledge that I will receive a copy of this consent concurrently upon execution via email to the email address I last provided but may also request a printed copy by calling the office of [facility].    I understand that my insurance may be billed for this visit.   I have read or had this consent read to me. . I understand the contents of this consent, which adequately explains the benefits and risks of the Services being provided via telemedicine.  Marland Kitchen  I have been provided ample opportunity to ask questions regarding this consent and the Services and have had my questions answered to my  satisfaction. . I give my informed consent for the services to be provided through the use of telemedicine in my medical care  By participating in this telemedicine visit I agree to the above.

## 2018-10-10 ENCOUNTER — Other Ambulatory Visit: Payer: Self-pay

## 2018-10-10 ENCOUNTER — Ambulatory Visit: Payer: 59 | Admitting: Family

## 2018-10-15 ENCOUNTER — Other Ambulatory Visit: Payer: Self-pay

## 2018-10-15 ENCOUNTER — Inpatient Hospital Stay
Admission: EM | Admit: 2018-10-15 | Discharge: 2018-10-16 | DRG: 638 | Disposition: A | Discharge status: Home / Self Care | Payer: No Typology Code available for payment source | Attending: Internal Medicine | Admitting: Internal Medicine

## 2018-10-15 ENCOUNTER — Encounter: Payer: Self-pay | Admitting: Emergency Medicine

## 2018-10-15 DIAGNOSIS — D638 Anemia in other chronic diseases classified elsewhere: Secondary | ICD-10-CM | POA: Diagnosis present

## 2018-10-15 DIAGNOSIS — I10 Essential (primary) hypertension: Secondary | ICD-10-CM | POA: Diagnosis present

## 2018-10-15 DIAGNOSIS — Z87891 Personal history of nicotine dependence: Secondary | ICD-10-CM

## 2018-10-15 DIAGNOSIS — Z7982 Long term (current) use of aspirin: Secondary | ICD-10-CM

## 2018-10-15 DIAGNOSIS — Z95810 Presence of automatic (implantable) cardiac defibrillator: Secondary | ICD-10-CM

## 2018-10-15 DIAGNOSIS — Z8249 Family history of ischemic heart disease and other diseases of the circulatory system: Secondary | ICD-10-CM | POA: Diagnosis not present

## 2018-10-15 DIAGNOSIS — E1100 Type 2 diabetes mellitus with hyperosmolarity without nonketotic hyperglycemic-hyperosmolar coma (NKHHC): Secondary | ICD-10-CM | POA: Diagnosis not present

## 2018-10-15 DIAGNOSIS — Z1159 Encounter for screening for other viral diseases: Secondary | ICD-10-CM

## 2018-10-15 DIAGNOSIS — E785 Hyperlipidemia, unspecified: Secondary | ICD-10-CM | POA: Diagnosis present

## 2018-10-15 DIAGNOSIS — Z794 Long term (current) use of insulin: Secondary | ICD-10-CM

## 2018-10-15 DIAGNOSIS — N179 Acute kidney failure, unspecified: Secondary | ICD-10-CM | POA: Diagnosis present

## 2018-10-15 DIAGNOSIS — E1110 Type 2 diabetes mellitus with ketoacidosis without coma: Secondary | ICD-10-CM

## 2018-10-15 DIAGNOSIS — E1310 Other specified diabetes mellitus with ketoacidosis without coma: Secondary | ICD-10-CM | POA: Diagnosis not present

## 2018-10-15 DIAGNOSIS — E860 Dehydration: Secondary | ICD-10-CM | POA: Diagnosis present

## 2018-10-15 DIAGNOSIS — Z951 Presence of aortocoronary bypass graft: Secondary | ICD-10-CM

## 2018-10-15 LAB — COMPREHENSIVE METABOLIC PANEL
ALT: 22 U/L (ref 0–44)
AST: 24 U/L (ref 15–41)
Albumin: 3.9 g/dL (ref 3.5–5.0)
Alkaline Phosphatase: 170 U/L — ABNORMAL HIGH (ref 38–126)
Anion gap: 13 (ref 5–15)
BUN: 28 mg/dL — ABNORMAL HIGH (ref 6–20)
CO2: 22 mmol/L (ref 22–32)
Calcium: 8.5 mg/dL — ABNORMAL LOW (ref 8.9–10.3)
Chloride: 85 mmol/L — ABNORMAL LOW (ref 98–111)
Creatinine, Ser: 1.52 mg/dL — ABNORMAL HIGH (ref 0.61–1.24)
GFR calc Af Amer: >60 mL/min (ref >60)
GFR calc non Af Amer: 55 mL/min — ABNORMAL LOW (ref >60)
Glucose, Bld: 904 mg/dL (ref 70–99)
Potassium: 4.6 mmol/L (ref 3.5–5.1)
Sodium: 120 mmol/L — ABNORMAL LOW (ref 135–145)
Total Bilirubin: 0.9 mg/dL (ref 0.3–1.2)
Total Protein: 7.3 g/dL (ref 6.5–8.1)

## 2018-10-15 LAB — GLUCOSE, CAPILLARY
Glucose-Capillary: 112 mg/dL — ABNORMAL HIGH (ref 70–99)
Glucose-Capillary: 170 mg/dL — ABNORMAL HIGH (ref 70–99)
Glucose-Capillary: 274 mg/dL — ABNORMAL HIGH (ref 70–99)
Glucose-Capillary: 296 mg/dL — ABNORMAL HIGH (ref 70–99)
Glucose-Capillary: 429 mg/dL — ABNORMAL HIGH (ref 70–99)
Glucose-Capillary: 540 mg/dL (ref 70–99)
Glucose-Capillary: 93 mg/dL (ref 70–99)
Glucose-Capillary: >600 mg/dL (ref 70–99)

## 2018-10-15 LAB — CBC
HCT: 29.5 % — ABNORMAL LOW (ref 39.0–52.0)
Hemoglobin: 9.3 g/dL — ABNORMAL LOW (ref 13.0–17.0)
MCH: 26.9 pg (ref 26.0–34.0)
MCHC: 31.5 g/dL (ref 30.0–36.0)
MCV: 85.3 fL (ref 80.0–100.0)
Platelets: 275 10*3/uL (ref 150–400)
RBC: 3.46 MIL/uL — ABNORMAL LOW (ref 4.22–5.81)
RDW: 13.2 % (ref 11.5–15.5)
WBC: 5.3 10*3/uL (ref 4.0–10.5)
nRBC: 0 % (ref 0.0–0.2)

## 2018-10-15 LAB — URINALYSIS, COMPLETE (UACMP) WITH MICROSCOPIC
Bacteria, UA: NONE SEEN
Bilirubin Urine: NEGATIVE
Glucose, UA: >=500 mg/dL — AB
Ketones, ur: NEGATIVE mg/dL
Leukocytes,Ua: NEGATIVE
Nitrite: NEGATIVE
Protein, ur: 30 mg/dL — AB
Specific Gravity, Urine: 1.009 (ref 1.005–1.030)
Squamous Epithelial / HPF: NONE SEEN (ref 0–5)
WBC, UA: NONE SEEN WBC/hpf (ref 0–5)
pH: 7 (ref 5.0–8.0)

## 2018-10-15 LAB — BLOOD GAS, VENOUS
Acid-base deficit: 1.8 mmol/L (ref 0.0–2.0)
Bicarbonate: 23.7 mmol/L (ref 20.0–28.0)
O2 Saturation: 90.8 %
Patient temperature: 37
pCO2, Ven: 42 mmHg — ABNORMAL LOW (ref 44.0–60.0)
pH, Ven: 7.36 (ref 7.250–7.430)
pO2, Ven: 63 mmHg — ABNORMAL HIGH (ref 32.0–45.0)

## 2018-10-15 LAB — LIPASE, BLOOD: Lipase: 33 U/L (ref 11–51)

## 2018-10-15 LAB — SARS CORONAVIRUS 2 BY RT PCR (HOSPITAL ORDER, PERFORMED IN ~~LOC~~ HOSPITAL LAB): SARS Coronavirus 2: NEGATIVE

## 2018-10-15 LAB — MRSA PCR SCREENING: MRSA by PCR: POSITIVE — AB

## 2018-10-15 LAB — MAGNESIUM: Magnesium: 1.9 mg/dL (ref 1.7–2.4)

## 2018-10-15 MED ORDER — HEPARIN SODIUM (PORCINE) 5000 UNIT/ML IJ SOLN
5000.0000 [IU] | Freq: Three times a day (TID) | INTRAMUSCULAR | Status: DC
  Administered 2018-10-15 – 2018-10-16 (×2): 5000 [IU] via SUBCUTANEOUS
  Filled 2018-10-15 (×2): qty 1

## 2018-10-15 MED ORDER — INSULIN ASPART 100 UNIT/ML ~~LOC~~ SOLN: 0.0000 [IU] | Freq: Every day | SUBCUTANEOUS | Status: DC

## 2018-10-15 MED ORDER — METOPROLOL SUCCINATE ER 50 MG PO TB24
25.0000 mg | ORAL_TABLET | Freq: Every day | ORAL | Status: DC
  Administered 2018-10-15 – 2018-10-16 (×2): 25 mg via ORAL
  Filled 2018-10-15 (×2): qty 1

## 2018-10-15 MED ORDER — INSULIN REGULAR(HUMAN) IN NACL 100-0.9 UT/100ML-% IV SOLN: INTRAVENOUS | Status: DC

## 2018-10-15 MED ORDER — DEXTROSE-NACL 5-0.45 % IV SOLN
INTRAVENOUS | Status: DC
  Administered 2018-10-15: 17:00:00 via INTRAVENOUS

## 2018-10-15 MED ORDER — INSULIN ASPART 100 UNIT/ML ~~LOC~~ SOLN
0.0000 [IU] | Freq: Three times a day (TID) | SUBCUTANEOUS | Status: DC
  Administered 2018-10-16 (×2): 5 [IU] via SUBCUTANEOUS
  Filled 2018-10-15 (×2): qty 1

## 2018-10-15 MED ORDER — BISACODYL 5 MG PO TBEC: 5.0000 mg | DELAYED_RELEASE_TABLET | Freq: Every day | ORAL | Status: DC | PRN

## 2018-10-15 MED ORDER — METHADONE HCL 10 MG PO TABS
130.0000 mg | ORAL_TABLET | Freq: Every day | ORAL | Status: DC
  Administered 2018-10-15 – 2018-10-16 (×2): 130 mg via ORAL
  Filled 2018-10-15 (×2): qty 13

## 2018-10-15 MED ORDER — MORPHINE SULFATE (PF) 4 MG/ML IV SOLN: 4.0000 mg | INTRAVENOUS | Status: DC | PRN

## 2018-10-15 MED ORDER — SUCRALFATE 1 GM/10ML PO SUSP
1.0000 g | Freq: Three times a day (TID) | ORAL | Status: DC
  Administered 2018-10-15 – 2018-10-16 (×3): 1 g via ORAL
  Filled 2018-10-15 (×5): qty 10

## 2018-10-15 MED ORDER — INSULIN REGULAR(HUMAN) IN NACL 100-0.9 UT/100ML-% IV SOLN
INTRAVENOUS | Status: DC
  Administered 2018-10-15: 5.4 [IU]/h via INTRAVENOUS
  Filled 2018-10-15: qty 100

## 2018-10-15 MED ORDER — LACTATED RINGERS IV SOLN
INTRAVENOUS | Status: DC
  Administered 2018-10-16: 900 mL via INTRAVENOUS
  Administered 2018-10-16: 09:00:00 via INTRAVENOUS

## 2018-10-15 MED ORDER — MAGNESIUM OXIDE 400 (241.3 MG) MG PO TABS
400.0000 mg | ORAL_TABLET | Freq: Every day | ORAL | Status: DC
  Administered 2018-10-15 – 2018-10-16 (×2): 400 mg via ORAL
  Filled 2018-10-15 (×3): qty 1

## 2018-10-15 MED ORDER — ONDANSETRON HCL 4 MG PO TABS: 4.0000 mg | ORAL_TABLET | Freq: Four times a day (QID) | ORAL | Status: DC | PRN

## 2018-10-15 MED ORDER — INSULIN REGULAR BOLUS VIA INFUSION
0.0000 [IU] | Freq: Three times a day (TID) | INTRAVENOUS | Status: DC
  Filled 2018-10-15: qty 10

## 2018-10-15 MED ORDER — ACETAMINOPHEN 650 MG RE SUPP: 650.0000 mg | Freq: Four times a day (QID) | RECTAL | Status: DC | PRN

## 2018-10-15 MED ORDER — ONDANSETRON HCL 4 MG/2ML IJ SOLN
4.0000 mg | Freq: Four times a day (QID) | INTRAMUSCULAR | Status: DC | PRN
  Administered 2018-10-16: 4 mg via INTRAVENOUS
  Filled 2018-10-15: qty 2

## 2018-10-15 MED ORDER — DEXTROSE 50 % IV SOLN: 25.0000 mL | INTRAVENOUS | Status: DC | PRN

## 2018-10-15 MED ORDER — SODIUM CHLORIDE 0.9 % IV SOLN: INTRAVENOUS | Status: DC

## 2018-10-15 MED ORDER — SODIUM CHLORIDE 0.9 % IV BOLUS
1000.0000 mL | Freq: Once | INTRAVENOUS | Status: AC
  Administered 2018-10-15: 1000 mL via INTRAVENOUS

## 2018-10-15 MED ORDER — POTASSIUM CHLORIDE 10 MEQ/100ML IV SOLN
10.0000 meq | INTRAVENOUS | Status: AC
  Administered 2018-10-15: 10 meq via INTRAVENOUS
  Filled 2018-10-15 (×2): qty 100

## 2018-10-15 MED ORDER — SODIUM CHLORIDE 0.9% FLUSH
3.0000 mL | Freq: Once | INTRAVENOUS | Status: AC
  Administered 2018-10-15: 3 mL via INTRAVENOUS

## 2018-10-15 MED ORDER — HYDROXYZINE HCL 25 MG PO TABS
25.0000 mg | ORAL_TABLET | Freq: Three times a day (TID) | ORAL | Status: DC | PRN
  Filled 2018-10-15: qty 1

## 2018-10-15 MED ORDER — INSULIN GLARGINE 100 UNIT/ML ~~LOC~~ SOLN
40.0000 [IU] | Freq: Every day | SUBCUTANEOUS | Status: DC
  Administered 2018-10-15: 40 [IU] via SUBCUTANEOUS
  Filled 2018-10-15 (×2): qty 0.4

## 2018-10-15 MED ORDER — DEXTROSE-NACL 5-0.45 % IV SOLN: INTRAVENOUS | Status: DC

## 2018-10-15 MED ORDER — ALBUTEROL SULFATE (2.5 MG/3ML) 0.083% IN NEBU: 2.5000 mg | INHALATION_SOLUTION | RESPIRATORY_TRACT | Status: DC | PRN

## 2018-10-15 MED ORDER — SENNOSIDES-DOCUSATE SODIUM 8.6-50 MG PO TABS: 1.0000 | ORAL_TABLET | Freq: Every evening | ORAL | Status: DC | PRN

## 2018-10-15 MED ORDER — ONDANSETRON HCL 4 MG/2ML IJ SOLN
4.0000 mg | Freq: Once | INTRAMUSCULAR | Status: AC
  Administered 2018-10-15: 4 mg via INTRAVENOUS
  Filled 2018-10-15: qty 2

## 2018-10-15 MED ORDER — HYDROCODONE-ACETAMINOPHEN 5-325 MG PO TABS: 1.0000 | ORAL_TABLET | ORAL | Status: DC | PRN

## 2018-10-15 MED ORDER — ACETAMINOPHEN 325 MG PO TABS: 650.0000 mg | ORAL_TABLET | Freq: Four times a day (QID) | ORAL | Status: DC | PRN

## 2018-10-15 MED ORDER — ASPIRIN 81 MG PO CHEW
81.0000 mg | CHEWABLE_TABLET | Freq: Every day | ORAL | Status: DC
  Administered 2018-10-15 – 2018-10-16 (×2): 81 mg via ORAL
  Filled 2018-10-15 (×2): qty 1

## 2018-10-15 MED ORDER — ATORVASTATIN CALCIUM 20 MG PO TABS
80.0000 mg | ORAL_TABLET | Freq: Every day | ORAL | Status: DC
  Administered 2018-10-15 – 2018-10-16 (×2): 80 mg via ORAL
  Filled 2018-10-15 (×2): qty 4

## 2018-10-15 NOTE — ED Triage Notes (Signed)
C/O nausea and hyperglycemia x 2 days.

## 2018-10-15 NOTE — ED Notes (Signed)
Blood glucose 540, insulin adjusted according to scale- dripp @ 9.6. will continue to monitor.

## 2018-10-15 NOTE — H&P (Addendum)
[facility] [place] [facility]   PATIENT NAME: Jesse Christian    MR#:  [id number]  DATE OF BIRTH:  1970/09/26  DATE OF ADMISSION:  10/15/2018  PRIMARY CARE PHYSICIAN: Dorcas CarrowJohnson, [name], DO   REQUESTING/REFERRING PHYSICIAN:  Dr. Sheria Langameron.  CHIEF COMPLAINT:   Chief Complaint  Patient presents with  . Nausea  . Hyperglycemia   Nausea, vomiting and generalized generalized weakness HISTORY OF PRESENT ILLNESS:  Jesse Christian  is a 48 y.o. male with a known history of diabetes, DKA, drug abuse, hyperlipidemia.  The patient presents the ED with above chief complaints.  For the past 2 days, he has had a nausea, vomiting and poor oral intake.  He has generalized weakness.  The symptoms has been worsening for the past day.  He is found high glucose   PAST MEDICAL HISTORY:   Past Medical History:  Diagnosis Date  . Diabetes mellitus without complication (HCC)   . Drug abuse (HCC)   . Hyperlipidemia   . Right arm fracture 2018    PAST SURGICAL HISTORY:   Past Surgical History:  Procedure Laterality Date  . CARDIAC SURGERY    . CARDIAC SURGERY  05/2018   double bypass  . ICD IMPLANT    . LEFT HEART CATH AND CORONARY ANGIOGRAPHY Right 05/06/2018   Procedure: LEFT HEART CATH AND CORONARY ANGIOGRAPHY;  Surgeon: Laurier NancyKhan, Shaukat A, MD;  Location: [facility] INVASIVE CV LAB;  Service: Cardiovascular;  Laterality: Right;    SOCIAL HISTORY:   Social History   Tobacco Use  . Smoking status: Former Smoker    Packs/day: 0.50    Types: Cigarettes  . Smokeless tobacco: Never Used  Substance Use Topics  . Alcohol use: No    FAMILY HISTORY:   Family History  Problem Relation Age of Onset  . Mental illness Mother   . Heart attack Father   . Heart disease Father   . Mental illness Sister   . Cancer Maternal Aunt        Lung cancer  . Prostate cancer Neg Hx   . Bladder Cancer Neg Hx   . Kidney cancer Neg Hx     DRUG ALLERGIES:   Allergies  Allergen Reactions   . Lyrica [Pregabalin] Other (See Comments)    Peripheral edema  . Metformin And Related Nausea And Vomiting    REVIEW OF SYSTEMS:   Review of Systems  Constitutional: Positive for malaise/fatigue and weight loss. Negative for chills and fever.  HENT: Negative for sore throat.   Eyes: Negative for blurred vision and double vision.  Respiratory: Positive for cough and shortness of breath. Negative for hemoptysis, sputum production, wheezing and stridor.   Cardiovascular: Negative for chest pain, palpitations, orthopnea and leg swelling.  Gastrointestinal: Positive for diarrhea, nausea and vomiting. Negative for abdominal pain, blood in stool and melena.  Genitourinary: Negative for dysuria, flank pain and hematuria.  Musculoskeletal: Negative for back pain and joint pain.  Neurological: Negative for dizziness, sensory change, focal weakness, seizures, loss of consciousness, weakness and headaches.  Endo/Heme/Allergies: Negative for polydipsia.  Psychiatric/Behavioral: Negative for depression. The patient is not nervous/anxious.     MEDICATIONS AT HOME:   Prior to Admission medications   Medication Sig Start Date End Date Taking? Authorizing Provider  aspirin 81 MG chewable tablet Chew 81 mg by mouth daily.     [provider]  atorvastatin (LIPITOR) 80 MG tablet Take 80 mg by mouth daily. 07/30/18   [provider]  dapagliflozin propanediol (FARXIGA) 10 MG TABS tablet Take 10 mg by mouth daily.    [provider]  furosemide (LASIX) 40 MG tablet Take 0.5 tablets (20 mg total) by mouth 2 (two) times daily. 08/22/18   Bettey Costa, MD  hydrOXYzine (ATARAX/VISTARIL) 25 MG tablet Take 1 tablet (25 mg total) by mouth 3 (three) times daily as needed. 09/06/18   [name], [name], DO  insulin aspart (NOVOLOG) 100 UNIT/ML injection Inject 3 Units into the skin 3 (three) times daily with meals for 30 days. 07/23/18 08/22/18  [name], [name], DO  insulin detemir (LEVEMIR)  100 UNIT/ML injection Inject 30 Units into the skin daily.    [provider]  losartan (COZAAR) 25 MG tablet Take 25 mg by mouth daily. 07/30/18   [provider]  magnesium oxide (MAG-OX) 400 MG tablet Take 400 mg by mouth daily.    [provider]  methadone (DOLOPHINE) 10 MG tablet Take 130 mg by mouth daily.     [provider]  metoprolol succinate (TOPROL-XL) 25 MG 24 hr tablet Take 25 mg by mouth daily. 07/30/18   [provider]  ondansetron (ZOFRAN) 4 MG tablet Take 1 tablet (4 mg total) by mouth every 6 (six) hours as needed for nausea. 09/06/18   [name], [name], DO  sucralfate (CARAFATE) 1 GM/10ML suspension Take 10 mLs (1 g total) by mouth 4 (four) times daily -  with meals and at bedtime. 09/06/18   [name], [name], DO      VITAL SIGNS:  Blood pressure (!) 179/108, pulse 98, temperature 98.6 F (37 C), temperature source Oral, resp. rate 18, weight 70.3 kg, SpO2 100 %.  PHYSICAL EXAMINATION:  Physical Exam  GENERAL:  48 y.o.-year-old patient lying in the bed with no acute distress.  EYES: Pupils equal, round, reactive to light and accommodation. No scleral icterus. Extraocular muscles intact.  HEENT: Head atraumatic, normocephalic. Oropharynx and nasopharynx clear.  NECK:  Supple, no jugular venous distention. No thyroid enlargement, no tenderness.  LUNGS: Normal breath sounds bilaterally, no wheezing, rales,rhonchi or crepitation. No use of accessory muscles of respiration.  CARDIOVASCULAR: S1, S2 normal. No murmurs, rubs, or gallops.  ABDOMEN: Soft, nontender, nondistended. Bowel sounds present. No organomegaly or mass.  EXTREMITIES: No pedal edema, cyanosis, or clubbing.  NEUROLOGIC: Cranial nerves II through XII are intact. Muscle strength 5/5 in all extremities. Sensation intact. Gait not checked.  PSYCHIATRIC: The patient is alert and oriented x 3.  SKIN: No obvious rash, lesion, or ulcer.   LABORATORY PANEL:   CBC Recent  Labs  Lab 10/15/18 1421  WBC 5.3  HGB 9.3*  HCT 29.5*  PLT 275   ------------------------------------------------------------------------------------------------------------------  Chemistries  Recent Labs  Lab 10/15/18 1421  NA 120*  K 4.6  CL 85*  CO2 22  GLUCOSE 904*  BUN 28*  CREATININE 1.52*  CALCIUM 8.5*  AST 24  ALT 22  ALKPHOS 170*  BILITOT 0.9   ------------------------------------------------------------------------------------------------------------------  Cardiac Enzymes No results for input(s): TROPONINI in the last 168 hours. ------------------------------------------------------------------------------------------------------------------  RADIOLOGY:  No results found.    IMPRESSION AND PLAN:   Acute hyperosmolar hyperglycemia. The patient will be admitted to stepdown unit. Continue insulin drip per ICU protocol.  N.p.o. for now.  Follow-up BMP every 6 hours. Hold Levemir and home medication. Pseudohyponatremia due to hyperglycemia.  Follow-up BMP. Acute renal failure due to dehydration.  IV fluid support and follow-up BMP. Anemia of chronic disease.  Stable. Discussed with Dr. Corinna Lines,  [facility] intensivist All the records are reviewed and case discussed with ED provider. Management plans discussed with the patient, family and they are in agreement.  CODE STATUS: Full code.  TOTAL CRITICAL TIME TAKING CARE OF THIS PATIENT: 50 minutes.    Shaune PollackQing Toryn Mcclinton M.D on 10/15/2018 at 5:23 PM  Between 7am to 6pm - Pager - [phone]  After 6pm go to [email or web address] - Social research officer, governmentpassword EPAS [facility]  [facility] [place] [facility]  Office  [phone]  CC: Primary care physician; Dorcas CarrowJohnson, [name], DO   Note: This dictation was prepared with Dragon dictation along with smaller phrase technology. Any transcriptional errors that result from this process are unin

## 2018-10-15 NOTE — ED Notes (Signed)
Blood glucose 296, report given to receiving nurse. Patient to unit.

## 2018-10-15 NOTE — ED Notes (Signed)
ED TO INPATIENT HANDOFF REPORT  ED Nurse Name and Phone #:    S Name/Age/Gender Jesse Christian 48 y.o. male Room/Bed: ED14A/ED14A  Code Status   Code Status: Prior  Home/SNF/Other   Patient oriented to: self, place, time and situation Is this baseline? Yes   Triage Complete: Triage complete  Chief Complaint hyperglycemia, nausea  Triage Note C/O nausea and hyperglycemia x 2 days.   Allergies Allergies  Allergen Reactions  . Lyrica [Pregabalin] Other (See Comments)    Peripheral edema  . Metformin And Related Nausea And Vomiting    Level of Care/Admitting Diagnosis ED Disposition    ED Disposition Condition [facility]: [place] [[id number]]  Level of Care: Stepdown [14]  Covid Evaluation: Asymptomatic Screening Protocol (No Symptoms)  Diagnosis: Type 2 diabetes mellitus with hyperosmolar nonketotic hyperglycemia [facility]) [409811]  Admitting Physician: Demetrios Loll [[id number]]  Attending Physician: Demetrios Loll [phone]  Estimated length of stay: past midnight tomorrow  Certification:: I certify this patient will need inpatient services for at least 2 midnights  PT Class (Do Not Modify): Inpatient [101]  PT Acc Code (Do Not Modify): Private [1]       B Medical/Surgery History Past Medical History:  Diagnosis Date  . Diabetes mellitus without complication ([place])   . Drug abuse ([place])   . Hyperlipidemia   . Right arm fracture 2018   Past Surgical History:  Procedure Laterality Date  . CARDIAC SURGERY    . CARDIAC SURGERY  05/2018   double bypass  . ICD IMPLANT    . LEFT HEART CATH AND CORONARY ANGIOGRAPHY Right 05/06/2018   Procedure: LEFT HEART CATH AND CORONARY ANGIOGRAPHY;  Surgeon: Dionisio David, MD;  Location: [place] CV LAB;  Service: Cardiovascular;  Laterality: Right;     A IV Location/Drains/Wounds Patient Lines/Drains/Airways Status   Active Line/Drains/Airways    Name:   Placement date:   Placement  time:   Site:   Days:   Peripheral IV 10/15/18 Right Forearm   10/15/18    1420    Forearm   less than 1   Peripheral IV 10/15/18 Right Forearm   10/15/18    1646    Forearm   less than 1          Intake/Output Last 24 hours  Intake/Output Summary (Last 24 hours) at 10/15/2018 1838 Last data filed at 10/15/2018 1727 Gross per 24 hour  Intake 1000 ml  Output 1000 ml  Net 0 ml    Labs/Imaging Results for orders placed or performed during the hospital encounter of 10/15/18 (from the past 48 hour(s))  Blood gas, venous     Status: Abnormal   Collection Time: 10/15/18  2:20 PM  Result Value Ref Range   pH, Ven 7.36 7.250 - 7.430   pCO2, Ven 42 (L) 44.0 - 60.0 mmHg   pO2, Ven 63.0 (H) 32.0 - 45.0 mmHg   Bicarbonate 23.7 20.0 - 28.0 mmol/L   Acid-base deficit 1.8 0.0 - 2.0 mmol/L   O2 Saturation 90.8 %   Patient temperature 37.0    Sample type VENOUS     Comment: Performed at [facility], [address] [place], [place] [address]  Lipase, blood     Status: None   Collection Time: 10/15/18  2:21 PM  Result Value Ref Range   Lipase 33 11 - 51 U/L    Comment: Performed at [facility], [address] [place], [place]  [phone]  Comprehensive metabolic panel     Status: Abnormal   Collection Time: 10/15/18  2:21 PM  Result Value Ref Range   Sodium 120 (L) 135 - 145 mmol/L   Potassium 4.6 3.5 - 5.1 mmol/L   Chloride 85 (L) 98 - 111 mmol/L   CO2 22 22 - 32 mmol/L   Glucose, Bld 904 (HH) 70 - 99 mg/dL    Comment: CRITICAL RESULT CALLED TO, READ BACK BY AND VERIFIED WITH TIFFANY [name] RN AT 1500 ON 10/15/2018 SNG    BUN 28 (H) 6 - 20 mg/dL   Creatinine, Ser 2.951.52 (H) 0.61 - 1.24 mg/dL   Calcium 8.5 (L) 8.9 - 10.3 mg/dL   Total Protein 7.3 6.5 - 8.1 g/dL   Albumin 3.9 3.5 - 5.0 g/dL   AST 24 15 - 41 U/L   ALT 22 0 - 44 U/L   Alkaline Phosphatase 170 (H) 38 - 126 U/L   Total Bilirubin 0.9 0.3 - 1.2 mg/dL   GFR calc non Af Amer 55 (L) >60 mL/min   GFR calc Af  Amer >60 >60 mL/min   Anion gap 13 5 - 15    Comment: Performed at [facility], [address] [place], [place] [address]  CBC     Status: Abnormal   Collection Time: 10/15/18  2:21 PM  Result Value Ref Range   WBC 5.3 4.0 - 10.5 K/uL   RBC 3.46 (L) 4.22 - 5.81 MIL/uL   Hemoglobin 9.3 (L) 13.0 - 17.0 g/dL   HCT 24.429.5 (L) 01.039.0 - 27.252.0 %   MCV 85.3 80.0 - 100.0 fL   MCH 26.9 26.0 - 34.0 pg   MCHC 31.5 30.0 - 36.0 g/dL   RDW 53.613.2 64.411.5 - 03.415.5 %   Platelets 275 150 - 400 K/uL   nRBC 0.0 0.0 - 0.2 %    Comment: Performed at [facility], [address] [place], [place] [address]  Glucose, capillary     Status: Abnormal   Collection Time: 10/15/18  4:52 PM  Result Value Ref Range   Glucose-Capillary >600 (HH) 70 - 99 mg/dL  Urinalysis, Complete w Microscopic     Status: Abnormal   Collection Time: 10/15/18  5:24 PM  Result Value Ref Range   Color, Urine COLORLESS (A) YELLOW   APPearance CLEAR (A) CLEAR   Specific Gravity, Urine 1.009 1.005 - 1.030   pH 7.0 5.0 - 8.0   Glucose, UA >=500 (A) NEGATIVE mg/dL   Hgb urine dipstick SMALL (A) NEGATIVE   Bilirubin Urine NEGATIVE NEGATIVE   Ketones, ur NEGATIVE NEGATIVE mg/dL   Protein, ur 30 (A) NEGATIVE mg/dL   Nitrite NEGATIVE NEGATIVE   Leukocytes,Ua NEGATIVE NEGATIVE   RBC / HPF 6-10 0 - 5 RBC/hpf   WBC, UA NONE SEEN 0 - 5 WBC/hpf   Bacteria, UA NONE SEEN NONE SEEN   Squamous Epithelial / LPF NONE SEEN 0 - 5    Comment: Performed at [facility], [address] [place], [place] [address]  Glucose, capillary     Status: Abnormal   Collection Time: 10/15/18  5:56 PM  Result Value Ref Range   Glucose-Capillary 540 (HH) 70 - 99 mg/dL   No results found.  Pending Labs [employer] Labs (From admission, onward)    Start     Ordered   10/15/18 1729  SARS Coronavirus 2 (CEPHEID - Performed in [facility] lab), Hosp Order  (Asymptomatic Patients Labs)  Once,  STAT    Question:  Rule Out  Answer:   Yes   10/15/18 1728   Signed and Held  CBC  (heparin)  Once,   R    Comments: Baseline for heparin therapy IF NOT ALREADY DRAWN.  Notify MD if PLT < 100 K.    Signed and Held   Signed and Held  Creatinine, serum  (heparin)  Once,   R    Comments: Baseline for heparin therapy IF NOT ALREADY DRAWN.    Signed and Held   Signed and Held  HIV antibody (Routine Testing)  Once,   R     Signed and Held   Signed and Held  Magnesium  Add-on,   R     Signed and Held   Signed and Held  Basic metabolic panel  Now then every 6 hours,   R     Signed and Held          Vitals/Pain Today's Vitals   10/15/18 1630 10/15/18 1645 10/15/18 1700 10/15/18 1725  BP: (!) 158/101  (!) 148/94 (!) 148/94  Pulse: 77 79 74 74  Resp:    18  Temp:      TempSrc:      SpO2: 100% 100% 100% 100%  Weight:      PainSc:    0-No pain    Isolation Precautions No active isolations  Medications Medications  insulin regular, human (MYXREDLIN) 100 units/ 100 mL infusion (5.4 Units/hr Intravenous New Bag/Given 10/15/18 1659)  dextrose 5 %-0.45 % sodium chloride infusion ( Intravenous New Bag/Given 10/15/18 1648)  potassium chloride 10 mEq in 100 mL IVPB (10 mEq Intravenous New Bag/Given 10/15/18 1702)  sodium chloride flush (NS) 0.9 % injection 3 mL (3 mLs Intravenous Given 10/15/18 1508)  ondansetron (ZOFRAN) injection 4 mg (4 mg Intravenous Given 10/15/18 1430)  sodium chloride 0.9 % bolus 1,000 mL (0 mLs Intravenous Stopped 10/15/18 1511)    Mobility walks Low fall risk   Focused Assessments endocrine    R Recommendations: See Admitting Provider Note  Report given to:   Additional Notes:

## 2018-10-15 NOTE — ED Provider Notes (Signed)
[facility] Emergency Department Provider Note  ____________________________________________   First MD Initiated Contact with Patient 10/15/18 [phone]     (approximate)  I have reviewed the triage vital signs and the nursing notes.   HISTORY  Chief Complaint Nausea and Hyperglycemia    HPI Jesse Christian is a 48 y.o. male with past medical history as below here with nausea, vomiting.  The patient has an extensive past medical history including a history of recurrent DKA.  He states that over the last 2 days, he has had nausea, vomiting, and poor p.o. intake.  Patient states that over the last 24 hours, he is developed progressively worsening weakness, generalized fatigue.  He has had associated mild shortness of breath.  He has been unable to eat or drink.  He states his blood sugars have been progressively escalating at home and have been unmeasurable.  They seem to be worse overnight and in the mornings.  He states that his symptoms feel similar to his previous episodes of DKA.  He denies any fevers or chills.  Denies any recent medication changes.  He does state that he has had DKA in the setting of his gastroparesis before, and that his nausea does feel somewhat like this.        Past Medical History:  Diagnosis Date  . Diabetes mellitus without complication (HCC)   . Drug abuse (HCC)   . Hyperlipidemia   . Right arm fracture 2018    Patient Active Problem List   Diagnosis Date Noted  . Acute CHF (HCC) 08/21/2018  . Chest pain 08/21/2018  . Acute on chronic combined systolic and diastolic CHF (congestive heart failure) (HCC) 08/21/2018  . Acute CHF (congestive heart failure) (HCC) 08/21/2018  . Hyperglycemia without ketosis 07/14/2018  . Malnutrition of moderate degree 05/07/2018  . Acute systolic heart failure (HCC) 05/06/2018  . Foot infection 09/23/2017  . Benign prostatic hyperplasia with nocturia 06/26/2016  . Anxiety disorder due to medical  condition 06/22/2015  . Insomnia 06/22/2015  . Hep C w/o coma, chronic (HCC) 06/10/2015  . Benign hypertensive renal disease 06/08/2015  . History of drug abuse in remission (HCC) 06/08/2015  . Diabetes mellitus with hyperglycemia, with long-term current use of insulin (HCC)   . Hyperlipidemia     Past Surgical History:  Procedure Laterality Date  . CARDIAC SURGERY    . CARDIAC SURGERY  05/2018   double bypass  . ICD IMPLANT    . LEFT HEART CATH AND CORONARY ANGIOGRAPHY Right 05/06/2018   Procedure: LEFT HEART CATH AND CORONARY ANGIOGRAPHY;  Surgeon: Laurier NancyKhan, Shaukat A, MD;  Location: [facility] INVASIVE CV LAB;  Service: Cardiovascular;  Laterality: Right;    Prior to Admission medications   Medication Sig Start Date End Date Taking? Authorizing Provider  aspirin 81 MG chewable tablet Chew 81 mg by mouth daily.     [provider]  atorvastatin (LIPITOR) 80 MG tablet Take 80 mg by mouth daily. 07/30/18   [provider]  dapagliflozin propanediol (FARXIGA) 10 MG TABS tablet Take 10 mg by mouth daily.    [provider]  furosemide (LASIX) 40 MG tablet Take 0.5 tablets (20 mg total) by mouth 2 (two) times daily. 08/22/18   Adrian SaranMody, Sital, MD  hydrOXYzine (ATARAX/VISTARIL) 25 MG tablet Take 1 tablet (25 mg total) by mouth 3 (three) times daily as needed. 09/06/18   [name], [name], DO  insulin aspart (NOVOLOG) 100 UNIT/ML injection Inject 3 Units into the skin  3 (three) times daily with meals for 30 days. 07/23/18 08/22/18  [name], [name], DO  insulin detemir (LEVEMIR) 100 UNIT/ML injection Inject 30 Units into the skin daily.    [provider]  losartan (COZAAR) 25 MG tablet Take 25 mg by mouth daily. 07/30/18   [provider]  magnesium oxide (MAG-OX) 400 MG tablet Take 400 mg by mouth daily.    [provider]  methadone (DOLOPHINE) 10 MG tablet Take 130 mg by mouth daily.     [provider]  metoprolol succinate (TOPROL-XL) 25 MG 24  hr tablet Take 25 mg by mouth daily. 07/30/18   [provider]  ondansetron (ZOFRAN) 4 MG tablet Take 1 tablet (4 mg total) by mouth every 6 (six) hours as needed for nausea. 09/06/18   [name], [name], DO  sucralfate (CARAFATE) 1 GM/10ML suspension Take 10 mLs (1 g total) by mouth 4 (four) times daily -  with meals and at bedtime. 09/06/18   Olevia PerchesJohnson, [name], DO    Allergies Lyrica [pregabalin] and Metformin and related  Family History  Problem Relation Age of Onset  . Mental illness Mother   . Heart attack Father   . Heart disease Father   . Mental illness Sister   . Cancer Maternal Aunt        Lung cancer  . Prostate cancer Neg Hx   . Bladder Cancer Neg Hx   . Kidney cancer Neg Hx     Social History Social History   Tobacco Use  . Smoking status: Former Smoker    Packs/day: 0.50    Types: Cigarettes  . Smokeless tobacco: Never Used  Substance Use Topics  . Alcohol use: No  . Drug use: No    Review of Systems  Review of Systems  Constitutional: Positive for fatigue. Negative for chills and fever.  HENT: Negative for sore throat.   Respiratory: Negative for shortness of breath.   Cardiovascular: Negative for chest pain.  Gastrointestinal: Positive for abdominal pain, nausea and vomiting.  Genitourinary: Negative for flank pain.  Musculoskeletal: Negative for neck pain.  Skin: Negative for rash and wound.  Allergic/Immunologic: Negative for immunocompromised state.  Neurological: Positive for weakness. Negative for numbness.  Hematological: Does not bruise/bleed easily.  All other systems reviewed and are negative.    ____________________________________________  PHYSICAL EXAM:      VITAL SIGNS: ED Triage Vitals  Enc Vitals Group     BP 10/15/18 1411 (!) 170/100     Pulse Rate 10/15/18 1411 86     Resp 10/15/18 1411 16     Temp 10/15/18 1411 98.8 F (37.1 C)     Temp Source 10/15/18 1411 Oral     SpO2 10/15/18 1411 95 %     Weight 10/15/18 1409  154 lb 15.7 oz (70.3 kg)     Height --      Head Circumference --      Peak Flow --      Pain Score 10/15/18 1409 0     Pain Loc --      Pain Edu? --      Excl. in GC? --      Physical Exam Vitals signs and nursing note reviewed.  Constitutional:      General: He is not in acute distress.    Appearance: He is well-developed.  HENT:     Head: Normocephalic and atraumatic.     Comments: Dry mucous membranes Eyes:  Conjunctiva/sclera: Conjunctivae normal.  Neck:     Musculoskeletal: Neck supple.  Cardiovascular:     Rate and Rhythm: Normal rate and regular rhythm.     Heart sounds: Normal heart sounds. No murmur. No friction rub.  Pulmonary:     Effort: Pulmonary effort is normal. Tachypnea present. No respiratory distress.     Breath sounds: Normal breath sounds. No wheezing or rales.  Abdominal:     General: There is no distension.     Palpations: Abdomen is soft.     Tenderness: There is no abdominal tenderness.  Skin:    General: Skin is warm.     Capillary Refill: Capillary refill takes less than 2 seconds.  Neurological:     Mental Status: He is alert and oriented to person, place, and time.     Motor: No abnormal muscle tone.       ____________________________________________   LABS (all labs ordered are listed, but only abnormal results are displayed)  Labs Reviewed  COMPREHENSIVE METABOLIC PANEL - Abnormal; Notable for the following components:      Result Value   Sodium 120 (*)    Chloride 85 (*)    Glucose, Bld 904 (*)    BUN 28 (*)    Creatinine, Ser 1.52 (*)    Calcium 8.5 (*)    Alkaline Phosphatase 170 (*)    GFR calc non Af Amer 55 (*)    All other components within normal limits  CBC - Abnormal; Notable for the following components:   RBC 3.46 (*)    Hemoglobin 9.3 (*)    HCT 29.5 (*)    All other components within normal limits  BLOOD GAS, VENOUS - Abnormal; Notable for the following components:   pCO2, Ven 42 (*)    pO2, Ven 63.0  (*)    All other components within normal limits  LIPASE, BLOOD  URINALYSIS, COMPLETE (UACMP) WITH MICROSCOPIC  URINALYSIS, ROUTINE W REFLEX MICROSCOPIC    ____________________________________________  EKG:  ________________________________________  RADIOLOGY All imaging, including plain films, CT scans, and ultrasounds, independently reviewed by me, and interpretations confirmed via formal radiology reads.  ED MD interpretation:   None  Official radiology report(s): No results found.  ____________________________________________  PROCEDURES   Procedure(s) performed (including Critical Care):  Procedures  ____________________________________________  INITIAL IMPRESSION / MDM / [place] / ED COURSE  As part of my medical decision making, I reviewed the following data within the electronic MEDICAL RECORD NUMBER Notes from prior ED visits and [place] Controlled Substance Database      *Jesse Christian was evaluated in Emergency Department on 10/15/2018 for the symptoms described in the history of present illness. He was evaluated in the context of the global COVID-19 pandemic, which necessitated consideration that the patient might be at risk for infection with the SARS-CoV-2 virus that causes COVID-19. Institutional protocols and algorithms that pertain to the evaluation of patients at risk for COVID-19 are in a state of rapid change based on information released by regulatory bodies including the CDC and federal and state organizations. These policies and algorithms were followed during the patient's care in the ED.  Some ED evaluations and interventions may be delayed as a result of limited staffing during the pandemic.*      Medical Decision Making: 48 yo M here with recurrent hyperglycemia. Labs show marked hyperglycemia with glucose of 94.  Patient also with mild AKI with BUN 28, creatinine 1.5.  Lipase is normal.  Anion gap  is 13 and bicarb is 22.  pH normal.  While  patient does not have clinical evidence of acute DKA currently, he is profoundly hyperglycemic with history of recurrent DKA due to brittle diabetes, and I suspect he is progressing to DKA currently.  Will start cautious fluids in the setting of his CHF, plan to admit with insulin drip.  ____________________________________________  FINAL CLINICAL IMPRESSION(S) / ED DIAGNOSES  Final diagnoses:  Diabetic ketoacidosis without coma associated with other specified diabetes mellitus (HCC)     MEDICATIONS GIVEN DURING THIS VISIT:  Medications  insulin regular, human (MYXREDLIN) 100 units/ 100 mL infusion (has no administration in time range)  dextrose 5 %-0.45 % sodium chloride infusion (has no administration in time range)  potassium chloride 10 mEq in 100 mL IVPB (has no administration in time range)  sodium chloride flush (NS) 0.9 % injection 3 mL (3 mLs Intravenous Given 10/15/18 1508)  ondansetron (ZOFRAN) injection 4 mg (4 mg Intravenous Given 10/15/18 1430)  sodium chloride 0.9 % bolus 1,000 mL (0 mLs Intravenous Stopped 10/15/18 1511)     ED Discharge Orders    None       Note:  This document was prepared using Dragon voice recognition software and may include unintentional dictation errors.   Shaune PollackIsaacs, Tyreonna Czaplicki, MD 10/15/18 [phone]

## 2018-10-15 NOTE — ED Notes (Signed)
Critical result, glucose 904

## 2018-10-15 NOTE — Consult Note (Signed)
Name: Jesse Christian MRN: [id number] DOB: [birth year]    ADMISSION DATE:  10/15/2018 CONSULTATION DATE:  10/15/2018  REFERRING MD :  Dr. Bridgett Larsson  CHIEF COMPLAINT:  Nausea, Vomiting, Generalized weakness  BRIEF Jesse Christian DESCRIPTION:  48 y.o. Male admitted with Hyperosmolar Hyperglycemic State (likely secondary to Gastroparesis) requiring insulin drip.  SIGNIFICANT EVENTS  10/15/18>> Admission to Stepdown  STUDIES:  N/A  CULTURES: SARS-CoV-2 PCR 7/7>> Negative  ANTIBIOTICS: N/A  HISTORY OF PRESENT ILLNESS:   Jesse Christian is a 48 year old male with a past medical history notable for diabetes mellitus, DKA, gastroparesis, drug abuse, hyperlipidemia who presents to [facility] ED on 10/15/2018 with complaints of nausea, vomiting, generalized weakness, and hyperglycemia.  He reports approximately a 2-day history of symptoms, that progressively got worse over the last 24 hours, with poor p.o. intake.  He denies fever/chills, shortness of breath, chest pain, cough or sick contacts.  He reports compliance with his home insulin, and that he has had similar episodes in the setting of gastroparesis.  Initial work-up in the ED revealed glucose 904, sodium 120, creatinine 1.52, anion gap 13, alkaline phosphatase 170, hemoglobin 9.3.  Venous blood gas with pH 7.36/CO2 42/O2 63/bicarb 23.7.  Urinalysis is negative for ketones, and negative for UTI.  His SARS-CoV-2 PCR is negative.  He is being admitted to stepdown unit for further work-up and treatment of hyperosmolar hyperglycemic state requiring insulin drip and AKI.  PCCM is consulted for further management.  PAST MEDICAL HISTORY :   has a past medical history of Diabetes mellitus without complication ([place]), Drug abuse ([place]), Hyperlipidemia, and Right arm fracture (2018).  has a past surgical history that includes LEFT HEART CATH AND CORONARY ANGIOGRAPHY (Right, 05/06/2018); Cardiac surgery; Cardiac surgery (05/2018); and ICD IMPLANT. Prior to Admission medications    Medication Sig Start Date End Date Taking? Authorizing Provider  aspirin 81 MG chewable tablet Chew 81 mg by mouth daily.    Yes [provider]  atorvastatin (LIPITOR) 80 MG tablet Take 80 mg by mouth daily. 07/30/18  Yes [provider]  furosemide (LASIX) 40 MG tablet Take 0.5 tablets (20 mg total) by mouth 2 (two) times daily. Jesse Christian taking differently: Take 40 mg by mouth daily.  08/22/18  Yes Mody, Ulice Bold, MD  insulin aspart (NOVOLOG) 100 UNIT/ML injection Inject 6 Units into the skin 3 (three) times daily before meals. 10/02/18 11/01/18 Yes [provider]  Insulin Glargine, 1 Unit Dial, (TOUJEO SOLOSTAR) 300 UNIT/ML SOPN Inject 40 Units into the skin every evening. 10/02/18 11/01/18 Yes [provider]  losartan (COZAAR) 25 MG tablet Take 25 mg by mouth daily. 07/30/18  Yes [provider]  magnesium oxide (MAG-OX) 400 MG tablet Take 400 mg by mouth daily.   Yes [provider]  methadone (DOLOPHINE) 10 MG tablet Take 130 mg by mouth daily.    Yes [provider]  metoCLOPramide (REGLAN) 10 MG tablet Take 5 mg by mouth every 6 (six) hours as needed for nausea/vomiting. 10/02/18  Yes [provider]  metoprolol succinate (TOPROL-XL) 25 MG 24 hr tablet Take 25 mg by mouth daily. 07/30/18  Yes [provider]  ondansetron (ZOFRAN-ODT) 8 MG disintegrating tablet Take 8 mg by mouth every 8 (eight) hours as needed for nausea. 10/02/18 11/01/18 Yes [provider]  sucralfate (CARAFATE) 1 GM/10ML suspension Take 10 mLs (1 g total) by mouth 4 (four) times daily -  with meals and at bedtime. 09/06/18  Yes [name], [name], DO  hydrOXYzine (  ATARAX/VISTARIL) 25 MG tablet Take 1 tablet (25 mg total) by mouth 3 (three) times daily as needed. Jesse Christian not taking: Reported on 10/15/2018 09/06/18   Olevia PerchesJohnson, [name], DO  ondansetron (ZOFRAN) 4 MG tablet Take 1 tablet (4 mg total) by mouth every 6 (six) hours as needed for nausea.  Jesse Christian not taking: Reported on 10/15/2018 09/06/18   Olevia PerchesJohnson, [name], DO   Allergies  Allergen Reactions  . Lyrica [Pregabalin] Other (See Comments)    Peripheral edema  . Metformin And Related Nausea And Vomiting    FAMILY HISTORY:  family history includes Cancer in his maternal aunt; Heart attack in his father; Heart disease in his father; Mental illness in his mother and sister. SOCIAL HISTORY:  reports that he has quit smoking. His smoking use included cigarettes. He smoked 0.50 packs per day. He has never used smokeless tobacco. He reports that he does not drink alcohol or use drugs.   COVID-19 DISASTER DECLARATION:  FULL CONTACT PHYSICAL EXAMINATION WAS NOT POSSIBLE DUE TO TREATMENT OF COVID-19 AND  CONSERVATION OF PERSONAL PROTECTIVE EQUIPMENT, LIMITED EXAM FINDINGS INCLUDE-  Jesse Christian assessed or the symptoms described in the history of present illness.  In the context of the Global COVID-19 pandemic, which necessitated consideration that the Jesse Christian might be at risk for infection with the SARS-CoV-2 virus that causes COVID-19, Institutional protocols and algorithms that pertain to the evaluation of patients at risk for COVID-19 are in a state of rapid change based on information released by regulatory bodies including the CDC and federal and state organizations. These policies and algorithms were followed during the Jesse Christian's care while in hospital.  REVIEW OF SYSTEMS:  Positives in BOLD:  Constitutional: Negative for fever, chills, weight loss, +malaise/fatigue and diaphoresis.  HENT: Negative for hearing loss, ear pain, nosebleeds, congestion, sore throat, neck pain, tinnitus and ear discharge.   Eyes: Negative for blurred vision, double vision, photophobia, pain, discharge and redness.  Respiratory: Negative for cough, hemoptysis, sputum production, shortness of breath, wheezing and stridor.   Cardiovascular: Negative for chest pain, palpitations, orthopnea, claudication, leg  swelling and PND.  Gastrointestinal: Negative for heartburn, nausea, vomiting, abdominal pain, diarrhea, constipation, blood in stool and melena.  Genitourinary: Negative for dysuria, urgency, frequency, hematuria and flank pain.  Musculoskeletal: Negative for myalgias, back pain, joint pain and falls.  Skin: Negative for itching and rash.  Neurological: Negative for dizziness, tingling, tremors, sensory change, speech change, focal weakness, seizures, loss of consciousness, weakness and headaches.  Endo/Heme/Allergies: Negative for environmental allergies and polydipsia. Does not bruise/bleed easily.  SUBJECTIVE:  -Reports generalized malaise/fatigue, but that he is feeling better and that his nausea and vomiting have subsided -Denies chest pain, shortness of breath, fever/chills -Wants to eat and drink  VITAL SIGNS: Temp:  [98.1 F (36.7 C)-98.8 F (37.1 C)] 98.1 F (36.7 C) (07/07 2127) Pulse Rate:  [68-98] 68 (07/07 2127) Resp:  [16-18] 18 (07/07 2127) BP: (136-179)/(84-108) 136/84 (07/07 2127) SpO2:  [95 %-100 %] 100 % (07/07 2004) Weight:  [70.3 kg] 70.3 kg (07/07 2127)  PHYSICAL EXAMINATION: General: Acutely ill-appearing male, sitting in bed, on room air, no acute distress Neuro: Awake, alert and oriented x4, follows commands, no focal deficits, speech clear HEENT: Atraumatic, normocephalic, neck supple, no JVD Cardiovascular: Regular rate and rhythm, S1-S2, no murmurs rubs or gallops, 2+ pulses throughout Lungs: Clear to auscultation bilaterally, even, nonlabored, normal effort, no accessory muscle use Abdomen: Soft, nontender, nondistended, no guarding or rebound tenderness, bowel sounds positive x4 Musculoskeletal:  Normal bulk and tone, no deformities, no edema Skin: Warm and dry, no obvious rashes, lesions, or ulcerations  Recent Labs  Lab 10/15/18 1421  NA 120*  K 4.6  CL 85*  CO2 22  BUN 28*  CREATININE 1.52*  GLUCOSE 904*   Recent Labs  Lab 10/15/18 1421   HGB 9.3*  HCT 29.5*  WBC 5.3  PLT 275   No results found.  ASSESSMENT / PLAN:  Hyperosmolar Hyperglycemic State, likely in setting of Gastroparesis -Aggressive IVF -Insulin drip -Follow BMP q6h -Once CBG <250, can convert to SSI and home Glargine 40 units -Consult diabetes coordinator, appreciate input -Prn Antiemetics -Continue home prn Reglan  AKI Pseudohyponatremia in setting of marked Hyperglycemia (Corrected Na for Glucose 904 is Na 133) -Monitor I&O's / urinary output -Follow BMP -Ensure adequate renal perfusion -Avoid nephrotoxic agents as able -Replace electrolytes as indicated -IV Fluids  Anemia of Chronic Disease -Monitor for S/Sx of bleeding -Trend CBC -Heparin SQ for VTE Prophylaxis  -Transfuse for Hgb <7        DISPOSITION: Stepdown GOALS OF CARE: Full Code VTE PROPHYLAXIS: Heparin SQ UPDATES: Updated Jesse Christian at bedside 10/15/18.  Harlon DittyJeremiah Keene, AGACNP-BC [facility] Pager: [phone] Cell: [phone]  10/15/2018, 9:34 PM

## 2018-10-15 NOTE — ED Notes (Signed)
Reports"  he has a bad taste in his mouth that makes him vomit and before you know it his blood glucose is extremely  High". . Reports munable to keep his medications down or food and fluids. Awaiting md eval and plan of care.

## 2018-10-16 LAB — GLUCOSE, CAPILLARY
Glucose-Capillary: 110 mg/dL — ABNORMAL HIGH (ref 70–99)
Glucose-Capillary: 231 mg/dL — ABNORMAL HIGH (ref 70–99)
Glucose-Capillary: 234 mg/dL — ABNORMAL HIGH (ref 70–99)
Glucose-Capillary: 241 mg/dL — ABNORMAL HIGH (ref 70–99)

## 2018-10-16 LAB — CBC
HCT: 30.2 % — ABNORMAL LOW (ref 39.0–52.0)
Hemoglobin: 9.9 g/dL — ABNORMAL LOW (ref 13.0–17.0)
MCH: 27 pg (ref 26.0–34.0)
MCHC: 32.8 g/dL (ref 30.0–36.0)
MCV: 82.5 fL (ref 80.0–100.0)
Platelets: 283 10*3/uL (ref 150–400)
RBC: 3.66 MIL/uL — ABNORMAL LOW (ref 4.22–5.81)
RDW: 13.2 % (ref 11.5–15.5)
WBC: 6.3 10*3/uL (ref 4.0–10.5)
nRBC: 0 % (ref 0.0–0.2)

## 2018-10-16 MED ORDER — CHLORHEXIDINE GLUCONATE CLOTH 2 % EX PADS
6.0000 | MEDICATED_PAD | Freq: Every day | CUTANEOUS | Status: DC
  Administered 2018-10-16: 6 via TOPICAL

## 2018-10-16 MED ORDER — FUROSEMIDE 40 MG PO TABS: 40.0000 mg | ORAL_TABLET | Freq: Every day | ORAL | Status: DC

## 2018-10-16 MED ORDER — ENOXAPARIN SODIUM 40 MG/0.4ML ~~LOC~~ SOLN
40.0000 mg | SUBCUTANEOUS | Status: DC
  Administered 2018-10-16: 40 mg via SUBCUTANEOUS
  Filled 2018-10-16: qty 0.4

## 2018-10-16 NOTE — Progress Notes (Addendum)
Inpatient Diabetes Program Recommendations  AACE/ADA: New Consensus Statement on Inpatient Glycemic Control   Target Ranges:  Prepandial:   less than 140 mg/dL      Peak postprandial:   less than 180 mg/dL (1-2 hours)      Critically ill patients:  140 - 180 mg/dL  Results for Cecille PoWAGONER, Terrell L (MRN [id number]) as of 10/16/2018 08:08  Ref. Range 10/15/2018 22:31 10/15/2018 23:32 10/16/2018 00:36 10/16/2018 06:18 10/16/2018 07:40  Glucose-Capillary Latest Ref Range: 70 - 99 mg/dL 409112 (H) 93 811110 (H) 914231 (H) 241 (H)  Results for Cecille PoWAGONER, Larrell L (MRN [id number]) as of 10/16/2018 08:08  Ref. Range 10/15/2018 14:21  Glucose Latest Ref Range: 70 - 99 mg/dL 086904 [facility](HH)   Results for Cecille PoWAGONER, Jaquez L (MRN [id number]) as of 10/16/2018 08:08  Ref. Range 08/21/2018 07:27  Hemoglobin A1C Latest Ref Range: 4.8 - 5.6 % 14.0 (H)   Review of Glycemic Control  Diabetes history: DM2 Outpatient Diabetes medications: Levemir 40 units QHS, Novolog 6 units TID with meals plus additional units per correction scale Current orders for Inpatient glycemic control: Levemir 40 units QHS, Novolog 0-15 units TID with meals, Novolog 0-5 units QHS  Inpatient Diabetes Program Recommendations:   Insulin - Basal: Noted patient received Levemir 40 units at 22:49 on 10/15/18 at transition from IV to SQ insulin.  Insulin - Meal Coverage: Please consider ordering Novolog 4 units TID with meals for meal coverage if patient eats at least 50% of meals.  HgbA1C: A1C 14% on 08/21/18 indicating an average glucose of 355 mg/dl. Patient was seen by Inpatient Diabetes Coordinator on 08/21/18 and 07/15/18 during last 2 admissions. Patient is followed by Dr. Tedd SiasSolum (Endocrinologist) and was last seen on 08/26/18.  NOTE: In reviewing chart, noted in Care Everywhere that patient seen Dr. Tedd SiasSolum on 08/26/18 and at that visit, patient was [facility] Levemir [facility] and Farxiga 10 mg daily was added (Novolog per correction scale was continued as previously prescribed).   Marcelline DeistFarxiga not listed on current medication list. Will continue to follow while inpatient.  Addendum 10/16/18@11 :59-Spoke with patient over the phone about diabetes and home regimen for diabetes control. Patient confirms he last seen Dr. Tedd SiasSolum on 08/26/18. Patient reports he is still taking Levemir 40 units QHS(not started on Toujeo yet since he has so much Levemir at home), Novolog 6 units TID with meals plus additional units per correction scale if needed.  Inquired about Marcelline DeistFarxiga and patient reports that he started taking [place] but due to his stomach issues, [facility] him to stop taking the [place].  Patient reports that glucose is consistently elevated in the 200's (fasting) and goes up during the day.   Reviewed last A1C results in the chart (14% on 08/21/18) and discussed glucose and A1C goals. Discussed importance of checking CBGs and maintaining good CBG control to prevent long-term and short-term complications. Patient reports he missed his last appointment with Dr. Tedd SiasSolum scheduled in June due to a death in his family. Asked patient to call Dr. Pricilla HandlerSolum's office and get an appointment for follow up.  Patient [facility] he can afford his copay ($75) but notes that he is checking glucose 2-3 times per day. Encouraged patient to check glucose 4 times per day (before meals and at bedtime) and to keep a log book of glucose readings and DM medication taken which patient will need to take to doctor appointments. Explained how Dr. Tedd SiasSolum can use  a detailed log book to continue to make adjustments with DM medications if needed.  Patient verbalized understanding of information discussed and reports no further questions at this time related to diabetes.  Thanks, Barnie Alderman, RN, MSN, CDE Diabetes Coordinator Inpatient Diabetes Program [phone] (Team Pager from 8am to 5pm)

## 2018-10-16 NOTE — Discharge Instructions (Signed)
Blood Glucose Monitoring, Adult °Monitoring your blood sugar (glucose) is an important part of managing your diabetes (diabetes mellitus). Blood glucose monitoring involves checking your blood glucose as often as directed and keeping a record (log) of your results over time. °Checking your blood glucose regularly and keeping a blood glucose log can: °· Help you and your health care provider adjust your diabetes management plan as needed, including your medicines or insulin. °· Help you understand how food, exercise, illnesses, and medicines affect your blood glucose. °· Let you know what your blood glucose is at any time. You can quickly find out if you have low blood glucose (hypoglycemia) or high blood glucose (hyperglycemia). °Your health care provider will set individualized treatment goals for you. Your goals will be based on your age, other medical conditions you have, and how you respond to diabetes treatment. Generally, the goal of treatment is to maintain the following blood glucose levels: °· Before meals (preprandial): 80-130 mg/dL (4.4-7.2 mmol/L). °· After meals (postprandial): below 180 mg/dL (10 mmol/L). °· A1c level: less than 7%. °Supplies needed: °· Blood glucose meter. °· Test strips for your meter. Each meter has its own strips. You must use the strips that came with your meter. °· A needle to prick your finger (lancet). Do not use a lancet more than one time. °· A device that holds the lancet (lancing device). °· A journal or log book to write down your results. °How to check your blood glucose ° °1. Wash your hands with soap and water. °2. Prick the side of your finger (not the tip) with the lancet. Use a different finger each time. °3. Gently rub the finger until a small drop of blood appears. °4. Follow instructions that come with your meter for inserting the test strip, applying blood to the strip, and using your blood glucose meter. °5. Write down your result and any notes. °Some meters  allow you to use areas of your body other than your finger (alternative sites) to test your blood. The most common alternative sites are: °· Forearm. °· Thigh. °· Palm of the hand. °If you think you may have hypoglycemia, or if you have a history of not knowing when your blood glucose is getting low (hypoglycemia unawareness), do not use alternative sites. Use your finger instead. Alternative sites may not be as accurate as the fingers, because blood flow is slower in these areas. This means that the result you get may be delayed, and it may be different from the result that you would get from your finger. °Follow these instructions at home: °Blood glucose log ° °· Every time you check your blood glucose, write down your result. Also write down any notes about things that may be affecting your blood glucose, such as your diet and exercise for the day. This information can help you and your health care provider: °? Look for patterns in your blood glucose over time. °? Adjust your diabetes management plan as needed. °· Check if your meter allows you to download your records to a computer. Most glucose meters store a record of glucose readings in the meter. °If you have type 1 diabetes: °· Check your blood glucose 2 or more times a day. °· Also check your blood glucose: °? Before every insulin injection. °? Before and after exercise. °? Before meals. °? 2 hours after a meal. °? Occasionally between 2:00 a.m. and 3:00 a.m., as directed. °? Before potentially dangerous tasks, like driving or using heavy machinery. °?   At bedtime. °· You may need to check your blood glucose more often, up to 6-10 times a day, if you: °? Use an insulin pump. °? Need multiple daily injections (MDI). °? Have diabetes that is not well-controlled. °? Are ill. °? Have a history of severe hypoglycemia. °? Have hypoglycemia unawareness. °If you have type 2 diabetes: °· If you take insulin or other diabetes medicines, check your blood glucose 2 or  more times a day. °· If you are on intensive insulin therapy, check your blood glucose 4 or more times a day. Occasionally, you may also need to check between 2:00 a.m. and 3:00 a.m., as directed. °· Also check your blood glucose: °? Before and after exercise. °? Before potentially dangerous tasks, like driving or using heavy machinery. °· You may need to check your blood glucose more often if: °? Your medicine is being adjusted. °? Your diabetes is not well-controlled. °? You are ill. °General tips °· Always keep your supplies with you. °· If you have questions or need help, all blood glucose meters have a 24-hour "hotline" phone number that you can call. You may also contact your health care provider. °· After you use a few boxes of test strips, adjust (calibrate) your blood glucose meter by following instructions that came with your meter. °Contact a health care provider if: °· Your blood glucose is at or above 240 mg/dL (13.3 mmol/L) for 2 days in a row. °· You have been sick or have had a fever for 2 days or longer, and you are not getting better. °· You have any of the following problems for more than 6 hours: °? You cannot eat or drink. °? You have nausea or vomiting. °? You have diarrhea. °Get help right away if: °· Your blood glucose is lower than 54 mg/dL (3 mmol/L). °· You become confused or you have trouble thinking clearly. °· You have difficulty breathing. °· You have moderate or large ketone levels in your urine. °Summary °· Monitoring your blood sugar (glucose) is an important part of managing your diabetes (diabetes mellitus). °· Blood glucose monitoring involves checking your blood glucose as often as directed and keeping a record (log) of your results over time. °· Your health care provider will set individualized treatment goals for you. Your goals will be based on your age, other medical conditions you have, and how you respond to diabetes treatment. °· Every time you check your blood glucose,  write down your result. Also write down any notes about things that may be affecting your blood glucose, such as your diet and exercise for the day. °This information is not intended to replace advice given to you by your health care provider. Make sure you discuss any questions you have with your health care provider. °Document Released: 03/30/2003 Document Revised: 01/18/2018 Document Reviewed: 09/06/2015 °Elsevier Patient Education © 2020 [employer] ° °

## 2018-10-16 NOTE — Progress Notes (Signed)
Pt discharged at this time. VSS prior to discharge. Discharge education reviewed with pt. No questions at this time. Pt taken to [place] entrance and picked up by mother.

## 2018-10-16 NOTE — Discharge Summary (Signed)
[facility] [place] [facility]   PATIENT NAME: Jesse Christian    MR#:  [id number]  DATE OF BIRTH:  1970-11-27  DATE OF ADMISSION:  10/15/2018 ADMITTING PHYSICIAN: Shaune PollackQing Chen, MD  DATE OF DISCHARGE: 10/16/2018  PRIMARY CARE PHYSICIAN: Dorcas CarrowJohnson, [name], DO    ADMISSION DIAGNOSIS:  Diabetic ketoacidosis without coma associated with other specified diabetes mellitus (HCC) [E13.10]  DISCHARGE DIAGNOSIS:  Active Problems:   Type 2 diabetes mellitus with hyperosmolar nonketotic hyperglycemia (HCC)   SECONDARY DIAGNOSIS:   Past Medical History:  Diagnosis Date  . Diabetes mellitus without complication (HCC)   . Drug abuse (HCC)   . Hyperlipidemia   . Right arm fracture 2018    HOSPITAL COURSE:   48 year old male with history of diabetes who presented to the emergency room due to nausea and vomiting.  1.  Hyperosmolar hyperglycemia: Patient was on insulin drip for short amount of time with IV fluids. Patient has been converted to home insulin. He will need outpatient follow-up with Dr. Tedd SiasSolum.  2.  Acute kidney injury in the setting of problem #1: Creatinine has improved  3.  Hyponatremia which is pseudohyponatremia in the setting of marked hyperglycemia sodium level corrects for elevated blood sugar.  4.  Anemia of chronic disease with stable hemoglobin  5.  Hypertension: Continue metoprolol and losartan 6.  Hyperlipidemia: Continue statin DISCHARGE CONDITIONS AND DIET:   Stable for discharge diabetic heart healthy diet  CONSULTS OBTAINED:    DRUG ALLERGIES:   Allergies  Allergen Reactions  . Lyrica [Pregabalin] Other (See Comments)    Peripheral edema  . Metformin And Related Nausea And Vomiting    DISCHARGE MEDICATIONS:   Allergies as of 10/16/2018      Reactions   Lyrica [pregabalin] Other (See Comments)   Peripheral edema   Metformin And Related Nausea And Vomiting      Medication List    STOP taking these medications   hydrOXYzine  25 MG tablet Commonly known as: ATARAX/VISTARIL   ondansetron 4 MG tablet Commonly known as: ZOFRAN     TAKE these medications   aspirin 81 MG chewable tablet Chew 81 mg by mouth daily.   atorvastatin 80 MG tablet Commonly known as: LIPITOR Take 80 mg by mouth daily.   furosemide 40 MG tablet Commonly known as: LASIX Take 1 tablet (40 mg total) by mouth daily.   losartan 25 MG tablet Commonly known as: COZAAR Take 25 mg by mouth daily.   magnesium oxide 400 MG tablet Commonly known as: MAG-OX Take 400 mg by mouth daily.   methadone 10 MG tablet Commonly known as: DOLOPHINE Take 130 mg by mouth daily.   metoCLOPramide 10 MG tablet Commonly known as: REGLAN Take 5 mg by mouth every 6 (six) hours as needed for nausea/vomiting.   metoprolol succinate 25 MG 24 hr tablet Commonly known as: TOPROL-XL Take 25 mg by mouth daily.   NovoLOG 100 UNIT/ML injection Generic drug: insulin aspart Inject 6 Units into the skin 3 (three) times daily before meals.   ondansetron 8 MG disintegrating tablet Commonly known as: ZOFRAN-ODT Take 8 mg by mouth every 8 (eight) hours as needed for nausea.   sucralfate 1 GM/10ML suspension Commonly known as: Carafate Take 10 mLs (1 g total) by mouth 4 (four) times daily -  with meals and at bedtime.   Toujeo SoloStar 300 UNIT/ML Sopn Generic drug: Insulin Glargine (1 Unit Dial) Inject 40 Units into the skin every evening.  Today   CHIEF COMPLAINT:  Patient without nausea and vomiting and tolerated diet well.   VITAL SIGNS:  Blood pressure 130/75, pulse 77, temperature 98 F (36.7 C), temperature source Oral, resp. rate 12, height 5\' 10"  (1.778 m), weight 70.3 kg, SpO2 98 %.   REVIEW OF SYSTEMS:  Review of Systems  Constitutional: Negative.  Negative for chills, fever and malaise/fatigue.  HENT: Negative.  Negative for ear discharge, ear pain, hearing loss, nosebleeds and sore throat.   Eyes: Negative.  Negative for  blurred vision and pain.  Respiratory: Negative.  Negative for cough, hemoptysis, shortness of breath and wheezing.   Cardiovascular: Negative.  Negative for chest pain, palpitations and leg swelling.  Gastrointestinal: Negative.  Negative for abdominal pain, blood in stool, diarrhea, nausea and vomiting.  Genitourinary: Negative.  Negative for dysuria.  Musculoskeletal: Negative.  Negative for back pain.  Skin: Negative.   Neurological: Negative for dizziness, tremors, speech change, focal weakness, seizures and headaches.  Endo/Heme/Allergies: Negative.  Does not bruise/bleed easily.  Psychiatric/Behavioral: Negative.  Negative for depression, hallucinations and suicidal ideas.     PHYSICAL EXAMINATION:  GENERAL:  48 y.o.-year-old patient lying in the bed with no acute distress. Thin  NECK:  Supple, no jugular venous distention. No thyroid enlargement, no tenderness.  LUNGS: Normal breath sounds bilaterally, no wheezing, rales,rhonchi  No use of accessory muscles of respiration.  CARDIOVASCULAR: S1, S2 normal. No murmurs, rubs, or gallops.  ABDOMEN: Soft, non-tender, non-distended. Bowel sounds present. No organomegaly or mass.  EXTREMITIES: No pedal edema, cyanosis, or clubbing.  PSYCHIATRIC: The patient is alert and oriented x 3.  SKIN: No obvious rash, lesion, or ulcer.   DATA REVIEW:   CBC Recent Labs  Lab 10/16/18 0815  WBC 6.3  HGB 9.9*  HCT 30.2*  PLT 283    Chemistries  Recent Labs  Lab 10/15/18 1421 10/15/18 2037  NA 120*  --   K 4.6  --   CL 85*  --   CO2 22  --   GLUCOSE 904*  --   BUN 28*  --   CREATININE 1.52*  --   CALCIUM 8.5*  --   MG  --  1.9  AST 24  --   ALT 22  --   ALKPHOS 170*  --   BILITOT 0.9  --     Cardiac Enzymes No results for input(s): TROPONINI in the last 168 hours.  Microbiology Results  @MICRORSLT48 @  RADIOLOGY:  No results found.    Allergies as of 10/16/2018      Reactions   Lyrica [pregabalin] Other (See Comments)    Peripheral edema   Metformin And Related Nausea And Vomiting      Medication List    STOP taking these medications   hydrOXYzine 25 MG tablet Commonly known as: ATARAX/VISTARIL   ondansetron 4 MG tablet Commonly known as: ZOFRAN     TAKE these medications   aspirin 81 MG chewable tablet Chew 81 mg by mouth daily.   atorvastatin 80 MG tablet Commonly known as: LIPITOR Take 80 mg by mouth daily.   furosemide 40 MG tablet Commonly known as: LASIX Take 1 tablet (40 mg total) by mouth daily.   losartan 25 MG tablet Commonly known as: COZAAR Take 25 mg by mouth daily.   magnesium oxide 400 MG tablet Commonly known as: MAG-OX Take 400 mg by mouth daily.   methadone 10 MG tablet Commonly known as: DOLOPHINE Take 130 mg by mouth daily.  metoCLOPramide 10 MG tablet Commonly known as: REGLAN Take 5 mg by mouth every 6 (six) hours as needed for nausea/vomiting.   metoprolol succinate 25 MG 24 hr tablet Commonly known as: TOPROL-XL Take 25 mg by mouth daily.   NovoLOG 100 UNIT/ML injection Generic drug: insulin aspart Inject 6 Units into the skin 3 (three) times daily before meals.   ondansetron 8 MG disintegrating tablet Commonly known as: ZOFRAN-ODT Take 8 mg by mouth every 8 (eight) hours as needed for nausea.   sucralfate 1 GM/10ML suspension Commonly known as: Carafate Take 10 mLs (1 g total) by mouth 4 (four) times daily -  with meals and at bedtime.   Toujeo SoloStar 300 UNIT/ML Sopn Generic drug: Insulin Glargine (1 Unit Dial) Inject 40 Units into the skin every evening.          Management plans discussed with the patient and he is in agreement. Stable for discharge home  Patient should follow up with dr Gabriel Carina  CODE STATUS:     Code Status Orders  (From admission, onward)         Start     Ordered   10/15/18 2023  Full code  Continuous     10/15/18 2022        Code Status History    Date Active Date Inactive Code Status Order ID  Comments User Context   08/21/2018 0726 08/22/2018 1628 Full Code [id number]  Mayer Camel, NP Inpatient   08/21/2018 0222 08/21/2018 0725 Full Code [id number]  Mayer Camel, NP ED   07/14/2018 2020 07/16/2018 1645 Full Code [id number]  Saundra Shelling, MD ED   05/06/2018 0504 05/12/2018 1435 Full Code [id number]  Harrie Foreman, MD Inpatient   09/23/2017 1515 09/25/2017 1750 Full Code [id number]  Nicholes Mango, MD Inpatient   12/26/2015 2110 12/27/2015 1954 Full Code [id number]  Vaughan Basta, MD Inpatient   Advance Care Planning Activity      TOTAL TIME TAKING CARE OF THIS PATIENT: 38 minutes.    Note: This dictation was prepared with Dragon dictation along with smaller phrase technology. Any transcriptional errors that result from this process are unintentional.  Bettey Costa M.D on 10/16/2018 at 10:56 AM  Between 7am to 6pm - Pager - [phone] After 6pm go to [email or web address] - password [email or web address] [place] [facility]  Office  [phone]  CC: Primary care physician; Valerie Roys, DO

## 2018-10-17 ENCOUNTER — Telehealth: Payer: Self-pay

## 2018-10-17 LAB — HIV ANTIBODY (ROUTINE TESTING W REFLEX): HIV Screen 4th Generation wRfx: NONREACTIVE

## 2018-10-17 NOTE — Telephone Encounter (Signed)
Transition Care Management Follow-up Telephone Call  Date of discharge and from where: 10/16/2018 [facility]   How have you been since you were released from the hospital? "not great but better than when I was in the hospital"  Any questions or concerns? Yes  having problems with stomach but im scheduled to see GI already"  Items Reviewed:  Did the pt receive and understand the discharge instructions provided? Yes   Medications obtained and verified? Yes   Any new allergies since your discharge? No   Dietary orders reviewed? Yes  Do you have support at home? Yes   Functional Questionnaire: (I = Independent and D = Dependent) ADLs: i  Bathing/Dressing- i  Meal Prep- i  Eating- i  Maintaining continence- i  Transferring/Ambulation- i  Managing Meds- i  Follow up appointments reviewed:   PCP Hospital f/u appt confirmed? Yes  Scheduled to see Dr.[name]  on 10/28/2018 @ 3:00pm.  [place] f/u appt confirmed? Discussed need to schedule with Dr.Solum  Are transportation arrangements needed? No   If their condition worsens, is the pt aware to call PCP or go to the Emergency Dept.? Yes  Was the patient provided with contact information for the PCP's office or ED? Yes  Was to pt encouraged to call back with questions or concerns? Yes

## 2018-10-28 ENCOUNTER — Inpatient Hospital Stay: Payer: Self-pay | Admitting: Family Medicine

## 2018-10-28 NOTE — Progress Notes (Deleted)
There were no vitals taken for this visit.   Subjective:    Patient ID: Jesse Christian, male    DOB: [birth year], 48 y.o.   MRN: [id number]  HPI: Jesse Christian is a 48 y.o. male  No chief complaint on file.  Transition of Care Hospital Follow up.   Hospital/Facility: [facility] D/C Physician: Dr. Benjie Karvonen D/C Date: 10/16/18  Records Requested: 10/17/18 Records Received: 10/17/18 Records Reviewed: 10/17/18  Diagnoses on Discharge:  DKA, Type 2 diabetes mellitus with hyperosmolar nonketotic hyperglycemia ([place])  Date of interactive Contact within 48 hours of discharge: 10/17/18 Contact was through: phone  Date of 7 day or 14 day face-to-face visit: 10/28/18   within 14 days  Outpatient Encounter Medications as of 10/28/2018  Medication Sig   aspirin 81 MG chewable tablet Chew 81 mg by mouth daily.    atorvastatin (LIPITOR) 80 MG tablet Take 80 mg by mouth daily.   furosemide (LASIX) 40 MG tablet Take 1 tablet (40 mg total) by mouth daily.   insulin aspart (NOVOLOG) 100 UNIT/ML injection Inject 6 Units into the skin 3 (three) times daily before meals.   Insulin Glargine, 1 Unit Dial, (TOUJEO SOLOSTAR) 300 UNIT/ML SOPN Inject 40 Units into the skin every evening.   losartan (COZAAR) 25 MG tablet Take 25 mg by mouth daily.   magnesium oxide (MAG-OX) 400 MG tablet Take 400 mg by mouth daily.   methadone (DOLOPHINE) 10 MG tablet Take 130 mg by mouth daily.    metoCLOPramide (REGLAN) 10 MG tablet Take 5 mg by mouth every 6 (six) hours as needed for nausea/vomiting.   metoprolol succinate (TOPROL-XL) 25 MG 24 hr tablet Take 25 mg by mouth daily.   ondansetron (ZOFRAN-ODT) 8 MG disintegrating tablet Take 8 mg by mouth every 8 (eight) hours as needed for nausea.   sucralfate (CARAFATE) 1 GM/10ML suspension Take 10 mLs (1 g total) by mouth 4 (four) times daily -  with meals and at bedtime.   No facility-administered encounter medications on file as of 10/28/2018.    PER HOSPITALIST:  " HOSPITAL COURSE:   48 year old male with history of diabetes who presented to the emergency room due to nausea and vomiting.  1.  Hyperosmolar hyperglycemia: Patient was on insulin drip for short amount of time with IV fluids. Patient has been converted to home insulin. He will need outpatient follow-up with Dr. Gabriel Carina.  2.  Acute kidney injury in the setting of problem #1: Creatinine has improved  3.  Hyponatremia which is pseudohyponatremia in the setting of marked hyperglycemia sodium level corrects for elevated blood sugar.  4.  Anemia of chronic disease with stable hemoglobin  5.  Hypertension: Continue metoprolol and losartan 6.  Hyperlipidemia: Continue statin"   Diagnostic Tests Reviewed: None done this admission  Disposition: Home  Consults:  None  Discharge Instructions:  Follow up here and with endocrinology  Disease/illness Education: Given in writing  Home Health/Community Services Discussions/Referrals: N/A  Establishment or re-establishment of referral orders for community resources:  N/A  Discussion with other health care providers: None  Assessment and Support of treatment regimen adherence:  Good  Appointments Coordinated with:  Patient   Education for self-management, independent living, and ADLs:  Given today.   Relevant past medical, surgical, family and social history reviewed and updated as indicated. Interim medical history since our last visit reviewed. Allergies and medications reviewed and updated.  Review of Systems  Per HPI unless specifically indicated above     Objective:  There were no vitals taken for this visit.  Wt Readings from Last 3 Encounters:  10/15/18 154 lb 15.7 oz (70.3 kg)  09/06/18 155 lb (70.3 kg)  08/22/18 146 lb 3.2 oz (66.3 kg)    Physical Exam  Results for orders placed or performed during the hospital encounter of 10/15/18  SARS Coronavirus 2 (CEPHEID - [facility]  Order   Specimen: Nasopharyngeal Swab  Result Value Ref Range   SARS Coronavirus 2 NEGATIVE NEGATIVE  MRSA PCR Screening   Specimen: Nasopharyngeal  Result Value Ref Range   MRSA by PCR POSITIVE (A) NEGATIVE  Lipase, blood  Result Value Ref Range   Lipase 33 11 - 51 U/L  Comprehensive metabolic panel  Result Value Ref Range   Sodium 120 (L) 135 - 145 mmol/L   Potassium 4.6 3.5 - 5.1 mmol/L   Chloride 85 (L) 98 - 111 mmol/L   CO2 22 22 - 32 mmol/L   Glucose, Bld 904 (HH) 70 - 99 mg/dL   BUN 28 (H) 6 - 20 mg/dL   Creatinine, Ser 1.301.52 (H) 0.61 - 1.24 mg/dL   Calcium 8.5 (L) 8.9 - 10.3 mg/dL   Total Protein 7.3 6.5 - 8.1 g/dL   Albumin 3.9 3.5 - 5.0 g/dL   AST 24 15 - 41 U/L   ALT 22 0 - 44 U/L   Alkaline Phosphatase 170 (H) 38 - 126 U/L   Total Bilirubin 0.9 0.3 - 1.2 mg/dL   GFR calc non Af Amer 55 (L) >60 mL/min   GFR calc Af Amer >60 >60 mL/min   Anion gap 13 5 - 15  CBC  Result Value Ref Range   WBC 5.3 4.0 - 10.5 K/uL   RBC 3.46 (L) 4.22 - 5.81 MIL/uL   Hemoglobin 9.3 (L) 13.0 - 17.0 g/dL   HCT 86.529.5 (L) 78.439.0 - 69.652.0 %   MCV 85.3 80.0 - 100.0 fL   MCH 26.9 26.0 - 34.0 pg   MCHC 31.5 30.0 - 36.0 g/dL   RDW 29.513.2 28.411.5 - 13.215.5 %   Platelets 275 150 - 400 K/uL   nRBC 0.0 0.0 - 0.2 %  Urinalysis, Complete w Microscopic  Result Value Ref Range   Color, Urine COLORLESS (A) YELLOW   APPearance CLEAR (A) CLEAR   Specific Gravity, Urine 1.009 1.005 - 1.030   pH 7.0 5.0 - 8.0   Glucose, UA >=500 (A) NEGATIVE mg/dL   Hgb urine dipstick SMALL (A) NEGATIVE   Bilirubin Urine NEGATIVE NEGATIVE   Ketones, ur NEGATIVE NEGATIVE mg/dL   Protein, ur 30 (A) NEGATIVE mg/dL   Nitrite NEGATIVE NEGATIVE   Leukocytes,Ua NEGATIVE NEGATIVE   RBC / HPF 6-10 0 - 5 RBC/hpf   WBC, UA NONE SEEN 0 - 5 WBC/hpf   Bacteria, UA NONE SEEN NONE SEEN   Squamous Epithelial / LPF NONE SEEN 0 - 5  Blood gas, venous  Result Value Ref Range   pH, Ven 7.36 7.250 - 7.430   pCO2, Ven 42 (L) 44.0 - 60.0  mmHg   pO2, Ven 63.0 (H) 32.0 - 45.0 mmHg   Bicarbonate 23.7 20.0 - 28.0 mmol/L   Acid-base deficit 1.8 0.0 - 2.0 mmol/L   O2 Saturation 90.8 %   Patient temperature 37.0    Sample type VENOUS   Glucose, capillary  Result Value Ref Range   Glucose-Capillary >600 (HH) 70 - 99 mg/dL  Glucose, capillary  Result Value Ref Range  Glucose-Capillary 540 (HH) 70 - 99 mg/dL  Glucose, capillary  Result Value Ref Range   Glucose-Capillary 429 (H) 70 - 99 mg/dL  Glucose, capillary  Result Value Ref Range   Glucose-Capillary 296 (H) 70 - 99 mg/dL  HIV antibody (Routine Testing)  Result Value Ref Range   HIV Screen 4th Generation wRfx Non Reactive Non Reactive  Magnesium  Result Value Ref Range   Magnesium 1.9 1.7 - 2.4 mg/dL  Glucose, capillary  Result Value Ref Range   Glucose-Capillary 274 (H) 70 - 99 mg/dL  Glucose, capillary  Result Value Ref Range   Glucose-Capillary 170 (H) 70 - 99 mg/dL  CBC  Result Value Ref Range   WBC 6.3 4.0 - 10.5 K/uL   RBC 3.66 (L) 4.22 - 5.81 MIL/uL   Hemoglobin 9.9 (L) 13.0 - 17.0 g/dL   HCT 78.230.2 (L) 95.639.0 - 21.352.0 %   MCV 82.5 80.0 - 100.0 fL   MCH 27.0 26.0 - 34.0 pg   MCHC 32.8 30.0 - 36.0 g/dL   RDW 08.613.2 57.811.5 - 46.915.5 %   Platelets 283 150 - 400 K/uL   nRBC 0.0 0.0 - 0.2 %  Glucose, capillary  Result Value Ref Range   Glucose-Capillary 112 (H) 70 - 99 mg/dL  Glucose, capillary  Result Value Ref Range   Glucose-Capillary 93 70 - 99 mg/dL  Glucose, capillary  Result Value Ref Range   Glucose-Capillary 110 (H) 70 - 99 mg/dL  Glucose, capillary  Result Value Ref Range   Glucose-Capillary 231 (H) 70 - 99 mg/dL  Glucose, capillary  Result Value Ref Range   Glucose-Capillary 241 (H) 70 - 99 mg/dL  Glucose, capillary  Result Value Ref Range   Glucose-Capillary 234 (H) 70 - 99 mg/dL      Assessment & Plan:   Problem List Items Addressed This Visit    None       Follow up plan: No follow-ups on file.

## 2018-10-30 MED ORDER — LOSARTAN POTASSIUM 25 MG PO TABS
25.00 | ORAL_TABLET | ORAL | Status: DC
Start: 2018-10-31 — End: 2018-10-30

## 2018-10-30 MED ORDER — MELATONIN 3 MG PO TABS
3.00 | ORAL_TABLET | ORAL | Status: DC
Start: ? — End: 2018-10-30

## 2018-10-30 MED ORDER — CHOLECALCIFEROL 25 MCG (1000 UT) PO TABS
5000.00 | ORAL_TABLET | ORAL | Status: DC
Start: 2018-10-31 — End: 2018-10-30

## 2018-10-30 MED ORDER — METOCLOPRAMIDE HCL 10 MG PO TABS
5.00 | ORAL_TABLET | ORAL | Status: DC
Start: 2018-10-30 — End: 2018-10-30

## 2018-10-30 MED ORDER — ASPIRIN 81 MG PO CHEW
81.00 | CHEWABLE_TABLET | ORAL | Status: DC
Start: 2018-10-31 — End: 2018-10-30

## 2018-10-30 MED ORDER — INSULIN LISPRO 100 UNIT/ML ~~LOC~~ SOLN
0.00 | SUBCUTANEOUS | Status: DC
Start: 2018-10-30 — End: 2018-10-30

## 2018-10-30 MED ORDER — GUAIFENESIN 100 MG/5ML PO SYRP
200.00 | ORAL_SOLUTION | ORAL | Status: DC
Start: ? — End: 2018-10-30

## 2018-10-30 MED ORDER — METOPROLOL SUCCINATE ER 25 MG PO TB24
50.00 | ORAL_TABLET | ORAL | Status: DC
Start: 2018-10-31 — End: 2018-10-30

## 2018-10-30 MED ORDER — INSULIN LISPRO 100 UNIT/ML ~~LOC~~ SOLN
6.00 | SUBCUTANEOUS | Status: DC
Start: 2018-10-30 — End: 2018-10-30

## 2018-10-30 MED ORDER — GENERIC EXTERNAL MEDICATION
Status: DC
Start: ? — End: 2018-10-30

## 2018-10-30 MED ORDER — ATORVASTATIN CALCIUM 80 MG PO TABS
80.00 | ORAL_TABLET | ORAL | Status: DC
Start: 2018-10-30 — End: 2018-10-30

## 2018-10-30 MED ORDER — FAMOTIDINE 20 MG PO TABS
20.00 | ORAL_TABLET | ORAL | Status: DC
Start: 2018-10-30 — End: 2018-10-30

## 2018-10-30 MED ORDER — METHADONE HCL 10 MG PO TABS
130.00 | ORAL_TABLET | ORAL | Status: DC
Start: 2018-10-31 — End: 2018-10-30

## 2018-10-30 MED ORDER — DEXTROSE 50 % IV SOLN
12.50 | INTRAVENOUS | Status: DC
Start: ? — End: 2018-10-30

## 2018-10-30 MED ORDER — ACETAMINOPHEN 325 MG PO TABS
650.00 | ORAL_TABLET | ORAL | Status: DC
Start: ? — End: 2018-10-30

## 2018-10-30 MED ORDER — ENOXAPARIN SODIUM 40 MG/0.4ML ~~LOC~~ SOLN
40.00 | SUBCUTANEOUS | Status: DC
Start: 2018-10-30 — End: 2018-10-30

## 2018-10-30 MED ORDER — INSULIN GLARGINE 100 UNIT/ML ~~LOC~~ SOLN
40.00 | SUBCUTANEOUS | Status: DC
Start: ? — End: 2018-10-30

## 2018-11-10 NOTE — Progress Notes (Deleted)
   Patient ID: Jesse Christian, male    DOB: 04/01/1971, 48 y.o.   MRN: [id number]  HPI  Jesse Christian is a 48 y/o male with a history of  Echo report from 05/06/2018 reviewed and showed an EF of 20% along with mild Jesse.   Catheterization done 05/06/2018 showed:  Mid RCA lesion is 60% stenosed.  Prox LAD lesion is 100% stenosed.  Ost 1st Mrg to 1st Mrg lesion is 85% stenosed.  Ost 2nd Mrg to 2nd Mrg lesion is 80% stenosed.  Mid Cx lesion is 80% stenosed.  2 vessel disease with occluded mid LAD, and high grade lesions of OM1 and OM 2 with severe LV dysfunction, once stable consider CABG.  Admitted 10/28/2018 due to dehydration due to vomiting which contributed to hyperglycemia. Treated and released after 2 days. Admitted 10/15/2018 due to hyperglycemia. Initially needed insulin drip due to glucose of 904. Released the following day.   He presents today for a follow-up visit with a chief complaint of   Review of Systems    Physical Exam  Assessment & Plan:   1: Chronic heart failure with reduced ejection fraction- - NYHA class - had telemedicine visit with cardiology (O'Quinn) 09/11/2018 - BMP 10/15/2018 reviewed and showed sodium 120, potassium 4.6, creatinine 1.52 and GFR 55 - BNP 08/20/2018 was 1168.0  2: DM- - had telemedicine visit with GI 10/02/2018 - saw PCP Wynetta Emery) 09/06/2018 - A1c 08/21/2018 was 14.0

## 2018-11-11 ENCOUNTER — Ambulatory Visit: Payer: 59 | Admitting: Family

## 2018-11-21 ENCOUNTER — Encounter: Payer: Self-pay | Admitting: *Deleted

## 2018-11-21 ENCOUNTER — Other Ambulatory Visit: Payer: Self-pay

## 2018-11-21 ENCOUNTER — Encounter: Payer: 59 | Attending: Family Medicine | Admitting: *Deleted

## 2018-11-21 DIAGNOSIS — Z951 Presence of aortocoronary bypass graft: Secondary | ICD-10-CM

## 2018-11-21 NOTE — Progress Notes (Signed)
Virtual Orientation completed. Diagnosis can be found in CH[facility] 2/2. EP/RD orientation scheduled for 8/20 at 8am

## 2018-11-21 NOTE — Progress Notes (Deleted)
   Patient ID: Jesse Christian, male    DOB: 08/08/1970, 47 y.o.   MRN: [id number]  HPI  Jesse Christian is a 47 y/o male with a history of  Echo report from 05/06/2018 reviewed and showed an EF of 20% along with mild Jesse.   Catheterization done 05/06/2018 showed:  Mid RCA lesion is 60% stenosed.  Prox LAD lesion is 100% stenosed.  Ost 1st Mrg to 1st Mrg lesion is 85% stenosed.  Ost 2nd Mrg to 2nd Mrg lesion is 80% stenosed.  Mid Cx lesion is 80% stenosed.  2 vessel disease with occluded mid LAD, and high grade lesions of OM1 and OM 2 with severe LV dysfunction, once stable consider CABG.  Admitted 10/28/2018 due to dehydration due to vomiting which contributed to hyperglycemia. Treated and released after 2 days. Admitted 10/15/2018 due to hyperglycemia. Initially needed insulin drip due to glucose of 904. Released the following day.   He presents today for a follow-up visit with a chief complaint of   Review of Systems    Physical Exam  Assessment & Plan:   1: Chronic heart failure with reduced ejection fraction- - NYHA class - had telemedicine visit with cardiology (O'Quinn) 09/11/2018 - BMP 10/15/2018 reviewed and showed sodium 120, potassium 4.6, creatinine 1.52 and GFR 55 - BNP 08/20/2018 was 1168.0  2: DM- - had telemedicine visit with GI 10/02/2018 - saw PCP ([name]) 09/06/2018 - A1c 08/21/2018 was 14.0  

## 2018-11-22 ENCOUNTER — Ambulatory Visit: Payer: Self-pay | Admitting: Family

## 2018-11-28 ENCOUNTER — Ambulatory Visit: Payer: 59

## 2018-12-08 IMAGING — CT CT ABD-PELV W/ CM
2 of 5 series · 14 of 46 positions shown, 16 images · IV contrast (APPLIED)
Comparison: Noncontrast CT Abdomen and Pelvis 09/27/2016.

CLINICAL DATA: 46-year-old male with abdominal pain, vomiting and 1
episode of diarrhea after eating today. Leukocytosis.

EXAM:
CT ABDOMEN AND PELVIS WITH CONTRAST
TECHNIQUE: Multidetector CT imaging of the abdomen and pelvis was performed
using the standard protocol following bolus administration of
intravenous contrast.
CONTRAST:  75mL XRLHUW-2QQ IOPAMIDOL (XRLHUW-2QQ) INJECTION 61%

[Series 2: axial st · axial · 0.74mm/px · z∈[-1295,-840]mm · 11 of 103 slices shown, 13 images]
[im 6/103  soft-tissue]
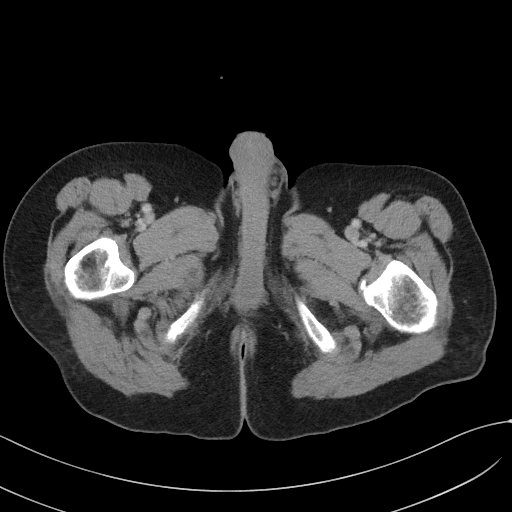
[im 6/103  bone]
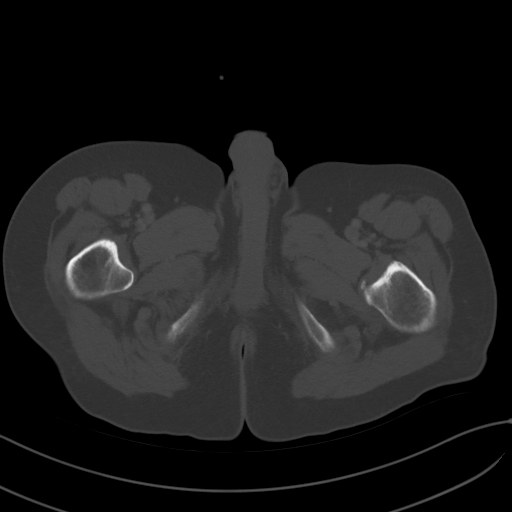
[im 18/103  soft-tissue]
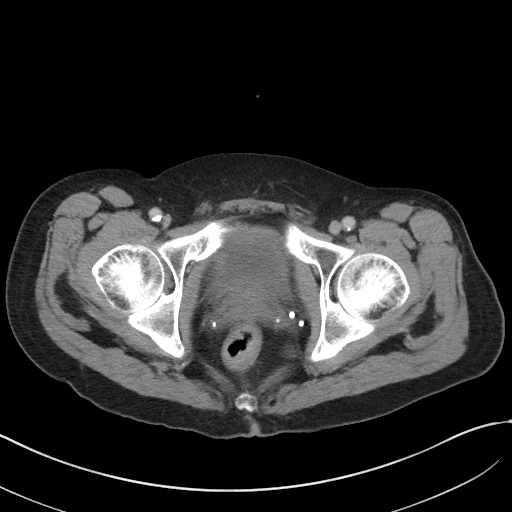
[im 23/103  soft-tissue]
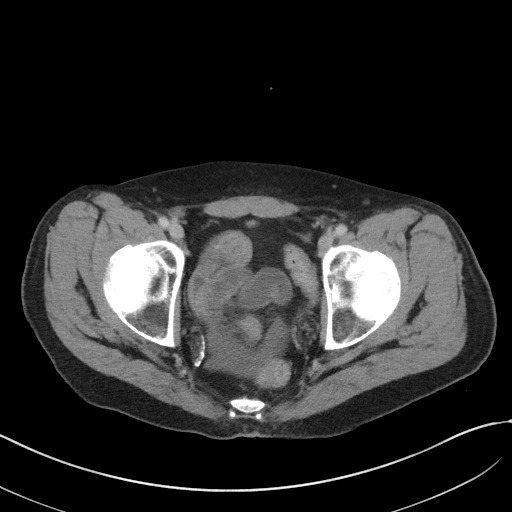
[im 35/103  soft-tissue]
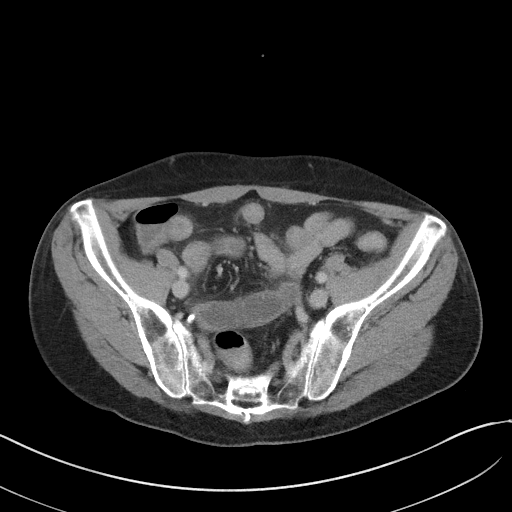
[im 40/103  soft-tissue]
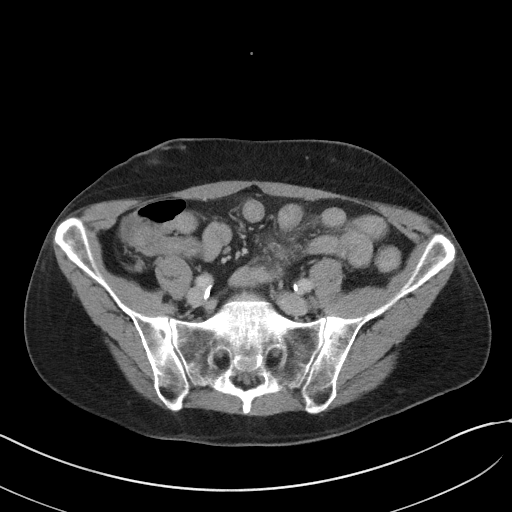
[im 52/103  soft-tissue]
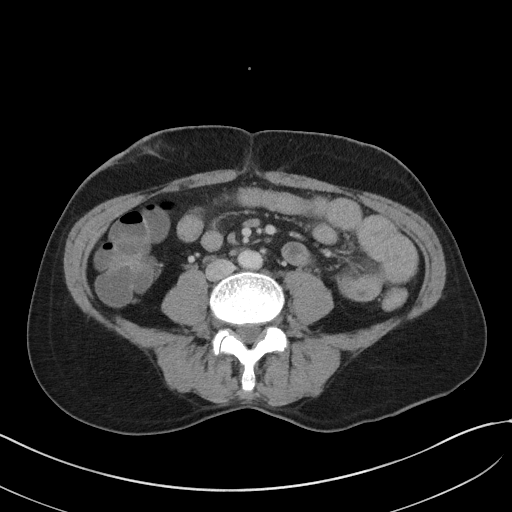
[im 63/103  soft-tissue]
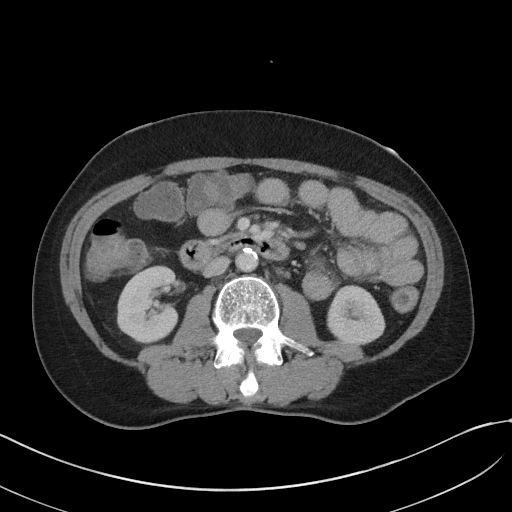
[im 69/103  soft-tissue]
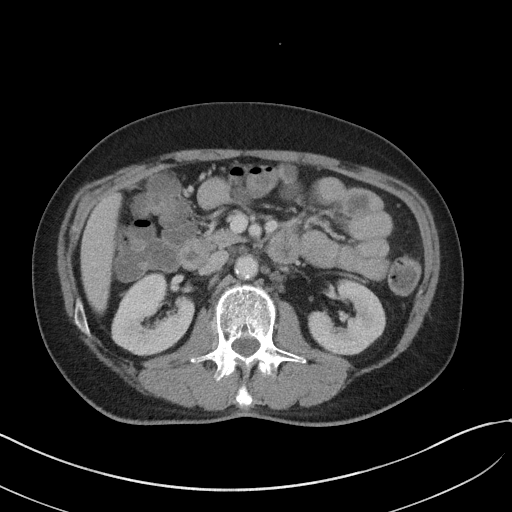
[im 80/103  soft-tissue]
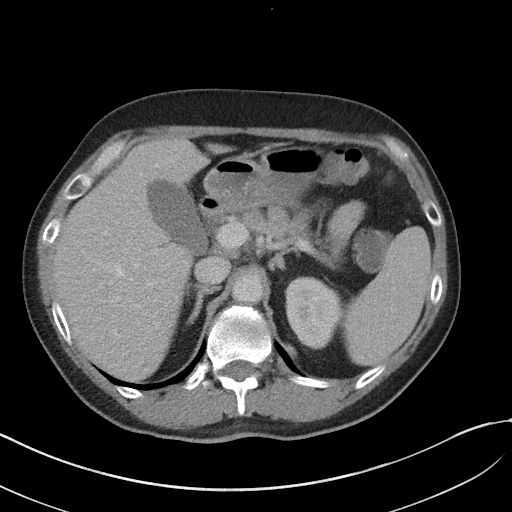
[im 80/103  bone]
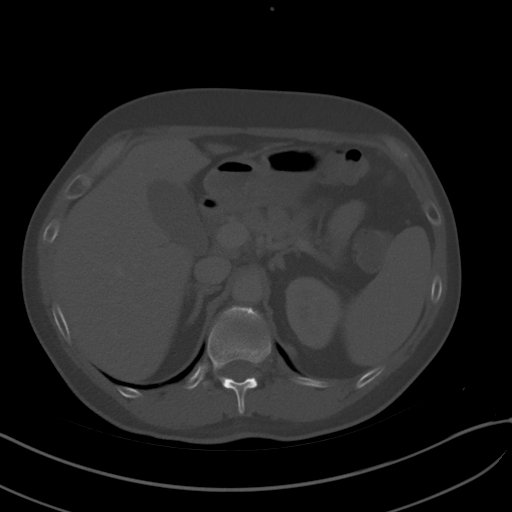
[im 86/103  soft-tissue]
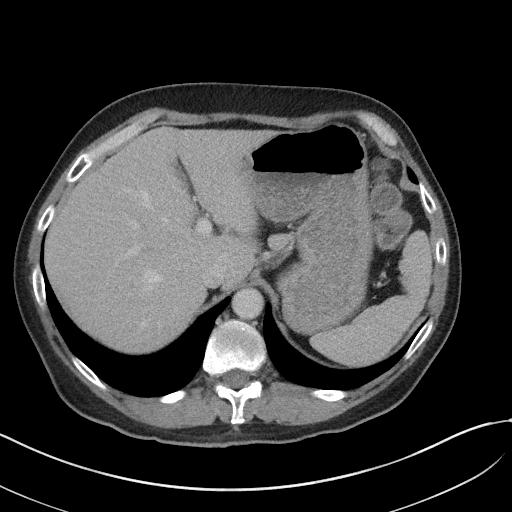
[im 97/103  soft-tissue]
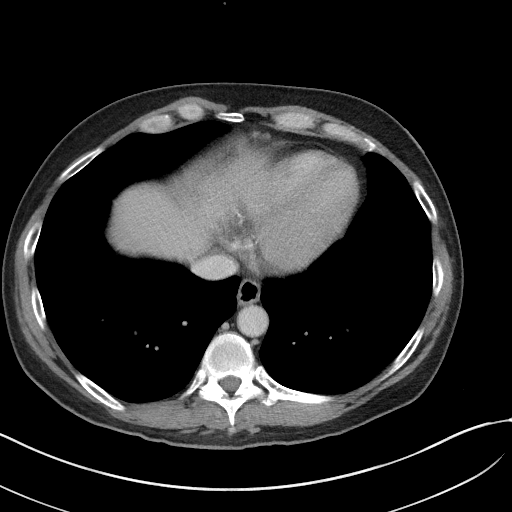

[Series 5: coronal st · coronal · 0.67mm/px · 3 of 87 slices shown]
[im 29/87  soft-tissue]
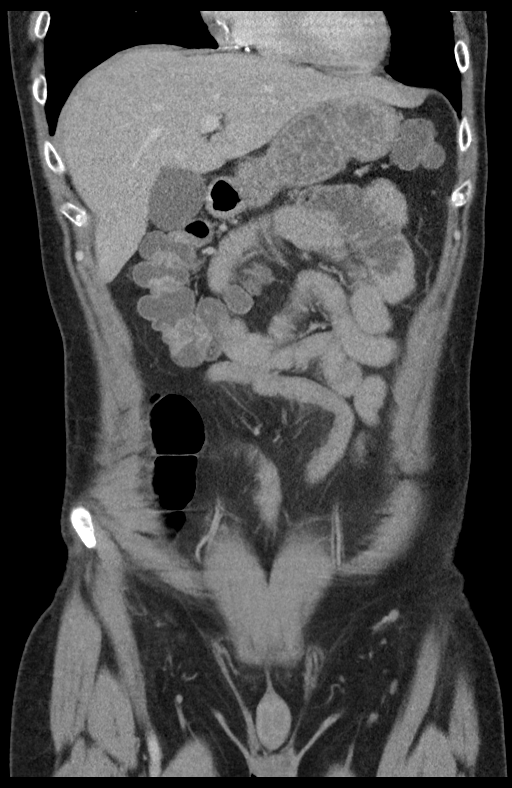
[im 39/87  soft-tissue]
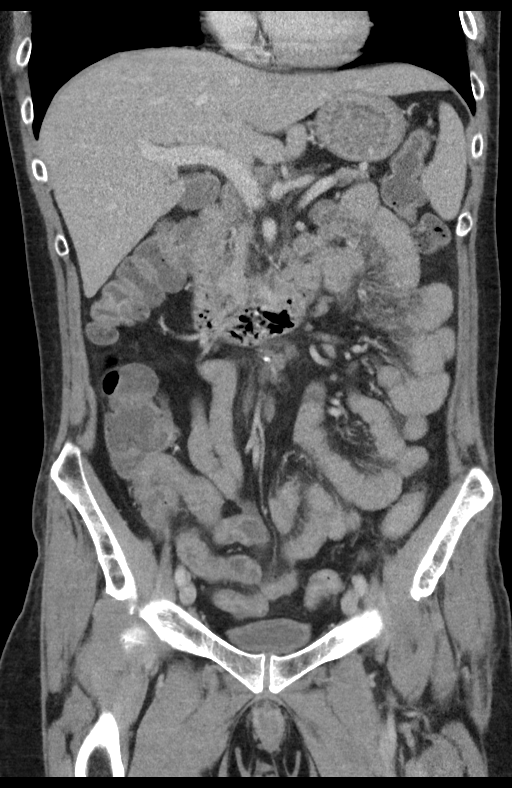
[im 48/87  soft-tissue]
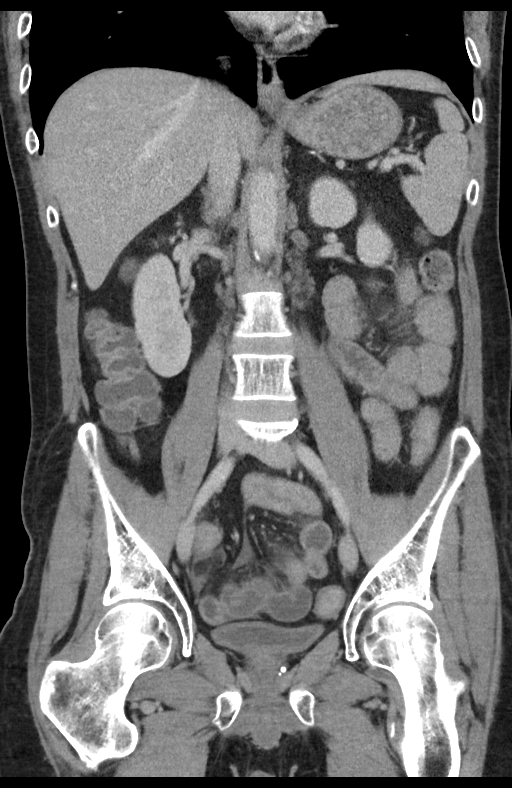

[14 of 46 positions shown; findings below may reference images not displayed]

FINDINGS: Lower chest: Stable and normal lung bases. No pericardial or pleural
effusion.

Hepatobiliary: Negative liver and gallbladder.

Pancreas: Negative.

Spleen: Negative.

Adrenals/Urinary Tract: Normal adrenal glands. Both kidneys appear
stable since 8033. There is a small nonobstructing right renal lower
pole calculus. No perinephric stranding. Symmetric and normal
contrast excretion from both kidneys on the delayed images.

Diminutive and unremarkable urinary bladder.

Stomach/Bowel: Decompressed rectosigmoid colon. Decompressed left
colon. There is fluid throughout the transverse colon which is
nondilated. Fluid at the hepatic flexure which is redundant. Fluid
in the right colon. Retrocecal appendix is NOT inflamed (series 2,
image 54).

The terminal ileum is decompressed. There are multiple fluid-filled
but nondilated small bowel loops in the lower abdomen and pelvis. In
the left abdomen jejunum demonstrates indistinctness of the wall and
mild mesenteric inflammatory stranding. There is similar mild.
Stranding in the distal small bowel mesentery.

The stomach is unremarkable. The duodenum appears within normal
limits.

No abdominal free air.  No abdominal free fluid.

Vascular/Lymphatic: Aortoiliac calcified atherosclerosis. Major
arterial structures in the abdomen and pelvis are patent. Portal
venous system is patent.

Similar appearance of small mesenteric and retroperitoneal lymph
nodes since 8033. No acute lymphadenopathy.

Reproductive: Negative.

Other: Small volume of abnormal pelvic free fluid. Simple fluid
density (series 2, image 81).

Mild chronic nonspecific fat stranding in the ventral abdominal
wall.

Musculoskeletal: Negative.
IMPRESSION: 1. Nondilated but inflamed small bowel throughout the abdomen and
pelvis is compatible with Acute nonspecific Enteritis. The terminal
ileum appears relatively spared.
2. Normal appendix. Fluid in nondilated large bowel from the right
colon to the splenic flexure compatible with diarrhea.
3. Small volume of abnormal pelvic free fluid is favored to be
reactive to #1.
4. Age advanced Aortic Atherosclerosis ([id number]), but major
arterial and portal venous systems in the abdomen and pelvis appear
patent.
5. Chronic right nephrolithiasis.

## 2018-12-21 NOTE — Progress Notes (Deleted)
   Patient ID: Jesse Christian, male    DOB: 1971-02-18, 48 y.o.   MRN: [id number]  HPI  Jesse Christian is a 48 y/o male with a history of  Echo report from 05/06/2018 reviewed and showed an EF of 20% along with mild Jesse.   Catheterization done 05/06/2018 showed:  Mid RCA lesion is 60% stenosed.  Prox LAD lesion is 100% stenosed.  Ost 1st Mrg to 1st Mrg lesion is 85% stenosed.  Ost 2nd Mrg to 2nd Mrg lesion is 80% stenosed.  Mid Cx lesion is 80% stenosed.  2 vessel disease with occluded mid LAD, and high grade lesions of OM1 and OM 2 with severe LV dysfunction, once stable consider CABG.  Admitted 10/28/2018 due to dehydration due to vomiting which contributed to hyperglycemia. Treated and released after 2 days. Admitted 10/15/2018 due to hyperglycemia. Initially needed insulin drip due to glucose of 904. Released the following day.   He presents today for a follow-up visit with a chief complaint of   Review of Systems    Physical Exam  Assessment & Plan:   1: Chronic heart failure with reduced ejection fraction- - NYHA class - had telemedicine visit with cardiology (Sivak) 11/18/2018 - BMP 10/15/2018 reviewed and showed sodium 120, potassium 4.6, creatinine 1.52 and GFR 55 - BNP 08/20/2018 was 1168.0 - has been going to cardiac rehab  2: DM- - had telemedicine visit with GI 10/02/2018 - saw PCP Wynetta Emery) 09/06/2018 - A1c 08/21/2018 was 14.0

## 2018-12-23 ENCOUNTER — Ambulatory Visit: Payer: Self-pay | Admitting: Family

## 2018-12-23 ENCOUNTER — Telehealth: Payer: Self-pay | Admitting: Family

## 2018-12-23 NOTE — Telephone Encounter (Signed)
Patient did not show for his [facility] appointment on 12/23/2018  Will attempt to reschedule.  

## 2019-01-07 ENCOUNTER — Inpatient Hospital Stay
Admission: EM | Admit: 2019-01-07 | Discharge: 2019-01-09 | DRG: 638 | Disposition: A | Discharge status: Home / Self Care | Payer: No Typology Code available for payment source | Attending: Internal Medicine | Admitting: Internal Medicine

## 2019-01-07 ENCOUNTER — Other Ambulatory Visit: Payer: Self-pay

## 2019-01-07 ENCOUNTER — Encounter: Payer: Self-pay | Admitting: Emergency Medicine

## 2019-01-07 ENCOUNTER — Emergency Department: Payer: No Typology Code available for payment source

## 2019-01-07 DIAGNOSIS — E785 Hyperlipidemia, unspecified: Secondary | ICD-10-CM | POA: Diagnosis present

## 2019-01-07 DIAGNOSIS — Z23 Encounter for immunization: Secondary | ICD-10-CM | POA: Diagnosis not present

## 2019-01-07 DIAGNOSIS — Z7982 Long term (current) use of aspirin: Secondary | ICD-10-CM | POA: Diagnosis not present

## 2019-01-07 DIAGNOSIS — E860 Dehydration: Secondary | ICD-10-CM | POA: Diagnosis present

## 2019-01-07 DIAGNOSIS — D649 Anemia, unspecified: Secondary | ICD-10-CM | POA: Diagnosis present

## 2019-01-07 DIAGNOSIS — E1165 Type 2 diabetes mellitus with hyperglycemia: Principal | ICD-10-CM | POA: Diagnosis present

## 2019-01-07 DIAGNOSIS — N179 Acute kidney failure, unspecified: Secondary | ICD-10-CM

## 2019-01-07 DIAGNOSIS — Z87891 Personal history of nicotine dependence: Secondary | ICD-10-CM

## 2019-01-07 DIAGNOSIS — E875 Hyperkalemia: Secondary | ICD-10-CM | POA: Diagnosis present

## 2019-01-07 DIAGNOSIS — E871 Hypo-osmolality and hyponatremia: Secondary | ICD-10-CM | POA: Diagnosis present

## 2019-01-07 DIAGNOSIS — I110 Hypertensive heart disease with heart failure: Secondary | ICD-10-CM | POA: Diagnosis present

## 2019-01-07 DIAGNOSIS — Z20828 Contact with and (suspected) exposure to other viral communicable diseases: Secondary | ICD-10-CM | POA: Diagnosis present

## 2019-01-07 DIAGNOSIS — I5042 Chronic combined systolic (congestive) and diastolic (congestive) heart failure: Secondary | ICD-10-CM | POA: Diagnosis present

## 2019-01-07 DIAGNOSIS — Z79891 Long term (current) use of opiate analgesic: Secondary | ICD-10-CM

## 2019-01-07 DIAGNOSIS — Z79899 Other long term (current) drug therapy: Secondary | ICD-10-CM

## 2019-01-07 DIAGNOSIS — R112 Nausea with vomiting, unspecified: Secondary | ICD-10-CM

## 2019-01-07 DIAGNOSIS — Z95810 Presence of automatic (implantable) cardiac defibrillator: Secondary | ICD-10-CM | POA: Diagnosis not present

## 2019-01-07 DIAGNOSIS — Z794 Long term (current) use of insulin: Secondary | ICD-10-CM

## 2019-01-07 DIAGNOSIS — R739 Hyperglycemia, unspecified: Secondary | ICD-10-CM | POA: Diagnosis present

## 2019-01-07 DIAGNOSIS — G8929 Other chronic pain: Secondary | ICD-10-CM | POA: Diagnosis present

## 2019-01-07 LAB — CBC
HCT: 32.5 % — ABNORMAL LOW (ref 39.0–52.0)
Hemoglobin: 10.5 g/dL — ABNORMAL LOW (ref 13.0–17.0)
MCH: 27.6 pg (ref 26.0–34.0)
MCHC: 32.3 g/dL (ref 30.0–36.0)
MCV: 85.3 fL (ref 80.0–100.0)
Platelets: 212 10*3/uL (ref 150–400)
RBC: 3.81 MIL/uL — ABNORMAL LOW (ref 4.22–5.81)
RDW: 12.8 % (ref 11.5–15.5)
WBC: 5 10*3/uL (ref 4.0–10.5)
nRBC: 0 % (ref 0.0–0.2)

## 2019-01-07 LAB — URINALYSIS, ROUTINE W REFLEX MICROSCOPIC
Bacteria, UA: NONE SEEN
Bilirubin Urine: NEGATIVE
Glucose, UA: >=500 mg/dL — AB
Ketones, ur: NEGATIVE mg/dL
Leukocytes,Ua: NEGATIVE
Nitrite: NEGATIVE
Protein, ur: 30 mg/dL — AB
Specific Gravity, Urine: 1.013 (ref 1.005–1.030)
Squamous Epithelial / HPF: NONE SEEN (ref 0–5)
pH: 7 (ref 5.0–8.0)

## 2019-01-07 LAB — GLUCOSE, CAPILLARY
Glucose-Capillary: >600 mg/dL (ref 70–99)
Glucose-Capillary: >600 mg/dL (ref 70–99)
Glucose-Capillary: >600 mg/dL (ref 70–99)
Glucose-Capillary: 593 mg/dL (ref 70–99)
Glucose-Capillary: 414 mg/dL — ABNORMAL HIGH (ref 70–99)
Glucose-Capillary: 261 mg/dL — ABNORMAL HIGH (ref 70–99)
Glucose-Capillary: 167 mg/dL — ABNORMAL HIGH (ref 70–99)
Glucose-Capillary: 72 mg/dL (ref 70–99)
Glucose-Capillary: 83 mg/dL (ref 70–99)
Glucose-Capillary: 122 mg/dL — ABNORMAL HIGH (ref 70–99)

## 2019-01-07 LAB — BASIC METABOLIC PANEL
Anion gap: 13 (ref 5–15)
Anion gap: 9 (ref 5–15)
BUN: 25 mg/dL — ABNORMAL HIGH (ref 6–20)
BUN: 20 mg/dL (ref 6–20)
CO2: 21 mmol/L — ABNORMAL LOW (ref 22–32)
CO2: 23 mmol/L (ref 22–32)
Calcium: 8.9 mg/dL (ref 8.9–10.3)
Calcium: 9 mg/dL (ref 8.9–10.3)
Chloride: 85 mmol/L — ABNORMAL LOW (ref 98–111)
Chloride: 102 mmol/L (ref 98–111)
Creatinine, Ser: 1.63 mg/dL — ABNORMAL HIGH (ref 0.61–1.24)
Creatinine, Ser: 1.17 mg/dL (ref 0.61–1.24)
GFR calc Af Amer: 58 mL/min — ABNORMAL LOW (ref >60)
GFR calc Af Amer: >60 mL/min (ref >60)
GFR calc non Af Amer: 50 mL/min — ABNORMAL LOW (ref >60)
GFR calc non Af Amer: >60 mL/min (ref >60)
Glucose, Bld: 990 mg/dL (ref 70–99)
Glucose, Bld: 198 mg/dL — ABNORMAL HIGH (ref 70–99)
Potassium: 5.2 mmol/L — ABNORMAL HIGH (ref 3.5–5.1)
Potassium: 3.5 mmol/L (ref 3.5–5.1)
Sodium: 119 mmol/L — CL (ref 135–145)
Sodium: 134 mmol/L — ABNORMAL LOW (ref 135–145)

## 2019-01-07 LAB — SARS CORONAVIRUS 2 BY RT PCR (HOSPITAL ORDER, PERFORMED IN ~~LOC~~ HOSPITAL LAB): SARS Coronavirus 2: NEGATIVE

## 2019-01-07 LAB — PHOSPHORUS: Phosphorus: 3.2 mg/dL (ref 2.5–4.6)

## 2019-01-07 LAB — MAGNESIUM: Magnesium: 2 mg/dL (ref 1.7–2.4)

## 2019-01-07 LAB — BLOOD GAS, VENOUS
Acid-base deficit: 0 mmol/L (ref 0.0–2.0)
Bicarbonate: 26.4 mmol/L (ref 20.0–28.0)
O2 Saturation: 57 %
Patient temperature: 37
pCO2, Ven: 49 mmHg (ref 44.0–60.0)
pH, Ven: 7.34 (ref 7.250–7.430)
pO2, Ven: 32 mmHg (ref 32.0–45.0)

## 2019-01-07 LAB — URINALYSIS, COMPLETE (UACMP) WITH MICROSCOPIC
Bacteria, UA: NONE SEEN
Bilirubin Urine: NEGATIVE
Glucose, UA: >=500 mg/dL — AB
Ketones, ur: NEGATIVE mg/dL
Leukocytes,Ua: NEGATIVE
Nitrite: NEGATIVE
Protein, ur: NEGATIVE mg/dL
Specific Gravity, Urine: 1.021 (ref 1.005–1.030)
Squamous Epithelial / HPF: NONE SEEN (ref 0–5)
pH: 6 (ref 5.0–8.0)

## 2019-01-07 LAB — BETA-HYDROXYBUTYRIC ACID: Beta-Hydroxybutyric Acid: 0.39 mmol/L — ABNORMAL HIGH (ref 0.05–0.27)

## 2019-01-07 LAB — MRSA PCR SCREENING: MRSA by PCR: NEGATIVE

## 2019-01-07 MED ORDER — ATORVASTATIN CALCIUM 20 MG PO TABS
80.0000 mg | ORAL_TABLET | Freq: Every day | ORAL | Status: DC
  Administered 2019-01-08 – 2019-01-09 (×2): 80 mg via ORAL
  Filled 2019-01-07 (×2): qty 4

## 2019-01-07 MED ORDER — DEXTROSE-NACL 5-0.45 % IV SOLN: INTRAVENOUS | Status: DC

## 2019-01-07 MED ORDER — ASPIRIN 81 MG PO CHEW
81.0000 mg | CHEWABLE_TABLET | Freq: Every day | ORAL | Status: DC
  Administered 2019-01-08 – 2019-01-09 (×2): 81 mg via ORAL
  Filled 2019-01-07 (×2): qty 1

## 2019-01-07 MED ORDER — METHADONE HCL 10 MG PO TABS
130.0000 mg | ORAL_TABLET | Freq: Every day | ORAL | Status: DC
  Administered 2019-01-08 – 2019-01-09 (×2): 130 mg via ORAL
  Filled 2019-01-07 (×2): qty 13

## 2019-01-07 MED ORDER — SUCRALFATE 1 GM/10ML PO SUSP: 1.0000 g | Freq: Three times a day (TID) | ORAL | Status: DC

## 2019-01-07 MED ORDER — METOPROLOL SUCCINATE ER 50 MG PO TB24
25.0000 mg | ORAL_TABLET | Freq: Every day | ORAL | Status: DC
  Administered 2019-01-08 – 2019-01-09 (×2): 25 mg via ORAL
  Filled 2019-01-07 (×2): qty 1

## 2019-01-07 MED ORDER — INSULIN REGULAR(HUMAN) IN NACL 100-0.9 UT/100ML-% IV SOLN
INTRAVENOUS | Status: DC
  Administered 2019-01-07: 5.4 [IU]/h via INTRAVENOUS
  Filled 2019-01-07: qty 100

## 2019-01-07 MED ORDER — CHLORHEXIDINE GLUCONATE CLOTH 2 % EX PADS: 6.0000 | MEDICATED_PAD | Freq: Every day | CUTANEOUS | Status: DC

## 2019-01-07 MED ORDER — METHADONE HCL 10 MG PO TABS
130.0000 mg | ORAL_TABLET | Freq: Once | ORAL | Status: AC
  Administered 2019-01-07: 130 mg via ORAL
  Filled 2019-01-07: qty 13

## 2019-01-07 MED ORDER — MAGNESIUM OXIDE 400 (241.3 MG) MG PO TABS
400.0000 mg | ORAL_TABLET | Freq: Every day | ORAL | Status: DC
  Administered 2019-01-08 – 2019-01-09 (×2): 400 mg via ORAL
  Filled 2019-01-07 (×2): qty 1

## 2019-01-07 MED ORDER — ENOXAPARIN SODIUM 40 MG/0.4ML ~~LOC~~ SOLN
40.0000 mg | SUBCUTANEOUS | Status: DC
  Administered 2019-01-07 – 2019-01-08 (×2): 40 mg via SUBCUTANEOUS
  Filled 2019-01-07 (×2): qty 0.4

## 2019-01-07 MED ORDER — INSULIN ASPART 100 UNIT/ML ~~LOC~~ SOLN: 0.0000 [IU] | Freq: Three times a day (TID) | SUBCUTANEOUS | Status: DC

## 2019-01-07 MED ORDER — ONDANSETRON 4 MG PO TBDP
4.0000 mg | ORAL_TABLET | Freq: Once | ORAL | Status: AC
  Administered 2019-01-07: 4 mg via ORAL
  Filled 2019-01-07: qty 1

## 2019-01-07 MED ORDER — INSULIN DETEMIR 100 UNIT/ML ~~LOC~~ SOLN
20.0000 [IU] | Freq: Every day | SUBCUTANEOUS | Status: DC
  Administered 2019-01-07: 20 [IU] via SUBCUTANEOUS
  Filled 2019-01-07: qty 0.2

## 2019-01-07 MED ORDER — SODIUM CHLORIDE 0.9 % IV SOLN
INTRAVENOUS | Status: DC
  Administered 2019-01-07: 23:00:00 via INTRAVENOUS

## 2019-01-07 MED ORDER — SODIUM CHLORIDE 0.9 % IV SOLN: INTRAVENOUS | Status: DC

## 2019-01-07 MED ORDER — INSULIN ASPART 100 UNIT/ML ~~LOC~~ SOLN: 0.0000 [IU] | Freq: Every day | SUBCUTANEOUS | Status: DC

## 2019-01-07 MED ORDER — SODIUM CHLORIDE 0.9 % IV BOLUS
1000.0000 mL | Freq: Once | INTRAVENOUS | Status: AC
  Administered 2019-01-07: 1000 mL via INTRAVENOUS

## 2019-01-07 MED ORDER — DEXTROSE 50 % IV SOLN
12.5000 g | Freq: Once | INTRAVENOUS | Status: AC
  Administered 2019-01-07: 12.5 g via INTRAVENOUS

## 2019-01-07 MED ORDER — DEXTROSE 50 % IV SOLN
INTRAVENOUS | Status: AC
  Filled 2019-01-07: qty 50

## 2019-01-07 MED ORDER — LOSARTAN POTASSIUM 50 MG PO TABS
25.0000 mg | ORAL_TABLET | Freq: Every day | ORAL | Status: DC
  Administered 2019-01-08: 25 mg via ORAL
  Filled 2019-01-07: qty 1

## 2019-01-07 MED ORDER — METOCLOPRAMIDE HCL 5 MG PO TABS
5.0000 mg | ORAL_TABLET | Freq: Four times a day (QID) | ORAL | Status: DC | PRN
  Filled 2019-01-07: qty 1

## 2019-01-07 NOTE — ED Notes (Signed)
Pt st that he has not been able to keep any medication down,. St waking up with a HA "like pressure behind my eyes" for the last few days and new onset of CP intermittent, with a pain rating of 8 on a 0-10 scale.

## 2019-01-07 NOTE — ED Notes (Signed)
Pt reporting nausea like sx. MD notified, orders placed see [facility]

## 2019-01-07 NOTE — ED Provider Notes (Signed)
[facility] Emergency Department Provider Note  ____________________________________________   First MD Initiated Contact with Patient 01/07/19 1537     (approximate)  I have reviewed the triage vital signs and the nursing notes.  History  Chief Complaint Nausea and Hyperglycemia    HPI HADES MATHEW is a 48 y.o. male with history of type 2 diabetes on insulin who presents for nausea, vomiting, hyperglycemia.   Patient reports ongoing N/V over the last several days. He states despite compliance with his insulin his blood sugars are consistently elevated.   He denies any fevers, SOB, or sick contacts. No abdominal pain at present.  Past Medical Hx Past Medical History:  Diagnosis Date  . Diabetes mellitus without complication (HCC)   . Drug abuse (HCC)   . Hyperlipidemia   . Right arm fracture 2018    Problem List Patient Active Problem List   Diagnosis Date Noted  . Type 2 diabetes mellitus with hyperosmolar nonketotic hyperglycemia (HCC) 10/15/2018  . Acute CHF (HCC) 08/21/2018  . Chest pain 08/21/2018  . Acute on chronic combined systolic and diastolic CHF (congestive heart failure) (HCC) 08/21/2018  . Acute CHF (congestive heart failure) (HCC) 08/21/2018  . Hyperglycemia without ketosis 07/14/2018  . Malnutrition of moderate degree 05/07/2018  . Acute systolic heart failure (HCC) 05/06/2018  . Foot infection 09/23/2017  . Benign prostatic hyperplasia with nocturia 06/26/2016  . Anxiety disorder due to medical condition 06/22/2015  . Insomnia 06/22/2015  . Hep C w/o coma, chronic (HCC) 06/10/2015  . Benign hypertensive renal disease 06/08/2015  . History of drug abuse in remission (HCC) 06/08/2015  . Diabetes mellitus with hyperglycemia, with long-term current use of insulin (HCC)   . Hyperlipidemia     Past Surgical Hx Past Surgical History:  Procedure Laterality Date  . CARDIAC SURGERY    . CARDIAC SURGERY  05/2018   double  bypass  . ICD IMPLANT    . LEFT HEART CATH AND CORONARY ANGIOGRAPHY Right 05/06/2018   Procedure: LEFT HEART CATH AND CORONARY ANGIOGRAPHY;  Surgeon: Laurier Nancy, MD;  Location: [facility] INVASIVE CV LAB;  Service: Cardiovascular;  Laterality: Right;    Medications Prior to Admission medications   Medication Sig Start Date End Date Taking? Authorizing Provider  aspirin 81 MG chewable tablet Chew 81 mg by mouth daily.    Yes [provider]  atorvastatin (LIPITOR) 80 MG tablet Take 80 mg by mouth daily. 07/30/18  Yes [provider]  furosemide (LASIX) 40 MG tablet Take 1 tablet (40 mg total) by mouth daily. 10/16/18  Yes Adrian Saran, MD  LEVEMIR FLEXTOUCH 100 UNIT/ML Pen  10/31/18  Yes [provider]  losartan (COZAAR) 25 MG tablet Take 25 mg by mouth daily. 07/30/18  Yes [provider]  magnesium oxide (MAG-OX) 400 MG tablet Take 400 mg by mouth daily.   Yes [provider]  methadone (DOLOPHINE) 10 MG tablet Take 130 mg by mouth daily.    Yes [provider]  metoCLOPramide (REGLAN) 10 MG tablet Take 5 mg by mouth every 6 (six) hours as needed for nausea/vomiting. 10/02/18  Yes [provider]  metoprolol succinate (TOPROL-XL) 25 MG 24 hr tablet Take 25 mg by mouth daily. 07/30/18  Yes [provider]  insulin aspart (NOVOLOG) 100 UNIT/ML injection Inject 6 Units into the skin 3 (three) times daily before meals. 10/02/18 11/01/18  [provider]  sucralfate (CARAFATE) 1 GM/10ML suspension Take 10 mLs (1 g total) by  mouth 4 (four) times daily -  with meals and at bedtime. 09/06/18   Park Liter P, DO    Allergies Lyrica [pregabalin] and Metformin and related  Family Hx Family History  Problem Relation Age of Onset  . Mental illness Mother   . Heart attack Father   . Heart disease Father   . Mental illness Sister   . Cancer Maternal Aunt        Lung cancer  . Prostate cancer Neg Hx   . Bladder Cancer Neg Hx    . Kidney cancer Neg Hx     Social Hx Social History   Tobacco Use  . Smoking status: Former Smoker    Packs/day: 0.50    Types: Cigarettes    Quit date: 05/05/2018    Years since quitting: 0.6  . Smokeless tobacco: Never Used  Substance Use Topics  . Alcohol use: No  . Drug use: No     Review of Systems  Constitutional: Negative for fever, chills. Eyes: Negative for visual changes. ENT: Negative for sore throat. Cardiovascular: Negative for chest pain. Respiratory: Negative for shortness of breath. Gastrointestinal: + for nausea, vomiting.  Genitourinary: Negative for dysuria. Musculoskeletal: Negative for leg swelling. Skin: Negative for rash. Neurological: Negative for for headaches.   Physical Exam  Vital Signs: ED Triage Vitals  Enc Vitals Group     BP 01/07/19 1324 (!) 165/97     Pulse Rate 01/07/19 1324 87     Resp 01/07/19 1324 16     Temp 01/07/19 1324 99 F (37.2 C)     Temp Source 01/07/19 1324 Oral     SpO2 01/07/19 1324 97 %     Weight 01/07/19 1326 150 lb (68 kg)     Height 01/07/19 1326 6' (1.829 m)     Head Circumference --      Peak Flow --      Pain Score 01/07/19 1325 0     Pain Loc --      Pain Edu? --      Excl. in [place]? --     Constitutional: Alert and oriented. Thin. Appears older than stated age. Head: Normocephalic. Atraumatic. Eyes: Conjunctivae clear. Sclera anicteric. Nose: No congestion. No rhinorrhea. Mouth/Throat: Mucous membranes are dry.  Neck: No stridor.   Cardiovascular: Normal rate, regular rhythm.Extremities well perfused. Respiratory: Normal respiratory effort.  Gastrointestinal: Soft. Non-tender throughout. Non-distended.  Musculoskeletal: No lower extremity edema. No deformities. Neurologic:  Normal speech and language. No gross focal neurologic deficits are appreciated.  Skin: Skin is warm, dry and intact. No rash noted. Psychiatric: Mood and affect are appropriate for situation.  EKG  N/A    Radiology   XR: IMPRESSION: Suspect bullous disease in the upper lobes. No edema or consolidation. Stable cardiac silhouette. Pacemaker lead attached to right ventricle.     Procedures  Procedure(s) performed (including critical care):  .Critical Care Performed by: Lilia Pro., MD Authorized by: Lilia Pro., MD   Critical care provider statement:    Critical care time (minutes):  30   Critical care was necessary to treat or prevent imminent or life-threatening deterioration of the following conditions:  Endocrine crisis   Critical care was time spent personally by me on the following activities:  Discussions with consultants, evaluation of patient's response to treatment, examination of patient, ordering and performing treatments and interventions, ordering and review of laboratory studies, ordering and review of radiographic studies, pulse oximetry, re-evaluation of patient's condition, obtaining history from  patient or surrogate and review of old charts     Initial Impression / Assessment and Plan / ED Course  48 y.o. male who presents to the ED for nausea, vomiting, hyperglycemia, as above. Hx DM2 on insulin.   Labs initiated in triage reveal significant hyperglycemia to 900s. AKI. Pseudohyponatremia on chem panel 2/2 hyperglycemia, corrects to normal. With sodium correction, AG ~30. BHB elevated. Normal pH. Concern for DKA vs HHNS. Will initiate IVF and insulin drip, and plan for admission. D/w hospitalist for admission.     Final Clinical Impression(s) / ED Diagnosis  Final diagnoses:  Hyperglycemia  Nausea and vomiting in adult  AKI (acute kidney injury) (HCC)       Note:  This document was prepared using Dragon voice recognition software and may include unintentional dictation errors.   Miguel AschoffMonks,  L., MD 01/08/19 [phone]

## 2019-01-07 NOTE — ED Triage Notes (Signed)
Pt in via POV, reports ongoing N/V and hyperglycemia x past few days, states sugars are uncontrolled with his insulin.    Pt also states, "Its hard to breath when I lay down, I have to lay in a semi-seated position."    Vitals WDL, NAD noted at this time.

## 2019-01-07 NOTE — Progress Notes (Signed)
Patient is transferring to ICU bed 8 because he is on an insulin drip. Report given to Sarah in ICU.

## 2019-01-07 NOTE — H&P (Signed)
[facility] - [place] [facility]   PATIENT NAME: Jesse Christian    MR#:  [id number]  DATE OF BIRTH:  17-Mar-1971  DATE OF ADMISSION:  01/07/2019  PRIMARY CARE PHYSICIAN: Dorcas CarrowJohnson, [name], DO   REQUESTING/REFERRING PHYSICIAN: Paschal DoppMonks, Sarah  CHIEF COMPLAINT:   Chief Complaint  Patient presents with  . Nausea  . Hyperglycemia    HISTORY OF PRESENT ILLNESS:  Jesse Christian  is a 48 y.o. male with a known history of diabetes mellitus, chronic pains on methadone, chronic diastolic and systolic CHF with ejection fraction of 20% from last echocardiogram in January 2020 and hyperlipidemia who presented to the emergency room with complaints of nausea and vomiting x2 days.  Denies any abdominal pains.  No diarrhea.  No fevers.  Claims to have been compliant with his diabetic regimen including insulin but have to decrease the dose of insulin due to decreased p.o. intake from nausea and vomiting over the last 2 days.  Patient was evaluated in the emergency room and found to have hyperglycemia with blood sugar of 990 with anion gap of 13.  Sodium level of 119.  Potassium of 5.2.  Patient diagnosed with hyperglycemia and started on IV fluids as well as insulin drip.  Medical service called to admit patient for further evaluation and management.  PAST MEDICAL HISTORY:   Past Medical History:  Diagnosis Date  . Diabetes mellitus without complication (HCC)   . Drug abuse (HCC)   . Hyperlipidemia   . Right arm fracture 2018    PAST SURGICAL HISTORY:   Past Surgical History:  Procedure Laterality Date  . CARDIAC SURGERY    . CARDIAC SURGERY  05/2018   double bypass  . ICD IMPLANT    . LEFT HEART CATH AND CORONARY ANGIOGRAPHY Right 05/06/2018   Procedure: LEFT HEART CATH AND CORONARY ANGIOGRAPHY;  Surgeon: Laurier NancyKhan, Shaukat A, MD;  Location: [facility] INVASIVE CV LAB;  Service: Cardiovascular;  Laterality: Right;    SOCIAL HISTORY:   Social History   Tobacco Use  . Smoking  status: Former Smoker    Packs/day: 0.50    Types: Cigarettes    Quit date: 05/05/2018    Years since quitting: 0.6  . Smokeless tobacco: Never Used  Substance Use Topics  . Alcohol use: No    FAMILY HISTORY:   Family History  Problem Relation Age of Onset  . Mental illness Mother   . Heart attack Father   . Heart disease Father   . Mental illness Sister   . Cancer Maternal Aunt        Lung cancer  . Prostate cancer Neg Hx   . Bladder Cancer Neg Hx   . Kidney cancer Neg Hx     DRUG ALLERGIES:   Allergies  Allergen Reactions  . Lyrica [Pregabalin] Other (See Comments)    Peripheral edema  . Metformin And Related Nausea And Vomiting    REVIEW OF SYSTEMS:   Review of Systems  Constitutional: Negative for fever.  HENT: Negative for hearing loss and tinnitus.   Eyes: Negative for blurred vision.  Respiratory: Negative for cough.   Cardiovascular: Negative for chest pain and palpitations.  Gastrointestinal: Positive for nausea and vomiting. Negative for abdominal pain, diarrhea and heartburn.  Genitourinary: Negative for dysuria and urgency.  Musculoskeletal: Negative for myalgias and neck pain.  Skin: Negative for rash.  Neurological: Negative for dizziness and headaches.  Psychiatric/Behavioral: Negative for depression and hallucinations.    MEDICATIONS AT HOME:  Prior to Admission medications   Medication Sig Start Date End Date Taking? Authorizing Provider  aspirin 81 MG chewable tablet Chew 81 mg by mouth daily.    Yes [provider]  atorvastatin (LIPITOR) 80 MG tablet Take 80 mg by mouth daily. 07/30/18  Yes [provider]  furosemide (LASIX) 40 MG tablet Take 1 tablet (40 mg total) by mouth daily. 10/16/18  Yes Bettey Costa, MD  LEVEMIR FLEXTOUCH 100 UNIT/ML Pen  10/31/18  Yes [provider]  losartan (COZAAR) 25 MG tablet Take 25 mg by mouth daily. 07/30/18  Yes [provider]  magnesium oxide (MAG-OX) 400 MG tablet Take  400 mg by mouth daily.   Yes [provider]  methadone (DOLOPHINE) 10 MG tablet Take 130 mg by mouth daily.    Yes [provider]  metoCLOPramide (REGLAN) 10 MG tablet Take 5 mg by mouth every 6 (six) hours as needed for nausea/vomiting. 10/02/18  Yes [provider]  metoprolol succinate (TOPROL-XL) 25 MG 24 hr tablet Take 25 mg by mouth daily. 07/30/18  Yes [provider]  insulin aspart (NOVOLOG) 100 UNIT/ML injection Inject 6 Units into the skin 3 (three) times daily before meals. 10/02/18 11/01/18  [provider]  sucralfate (CARAFATE) 1 GM/10ML suspension Take 10 mLs (1 g total) by mouth 4 (four) times daily -  with meals and at bedtime. 09/06/18   [name], [name], DO      VITAL SIGNS:  Blood pressure (!) 165/97, pulse 87, temperature 99 F (37.2 C), temperature source Oral, resp. rate 16, height 6' (1.829 m), weight 68 kg, SpO2 97 %.  PHYSICAL EXAMINATION:  Physical Exam  GENERAL:  48 y.o.-year-old patient lying in the bed with no acute distress.  EYES: Pupils equal, round, reactive to light and accommodation. No scleral icterus. Extraocular muscles intact.  HEENT: Head atraumatic, normocephalic.  Dry oral mucosa.    NECK:  Supple, no jugular venous distention. No thyroid enlargement, no tenderness.  LUNGS: Normal breath sounds bilaterally, no wheezing, rales,rhonchi or crepitation. No use of accessory muscles of respiration.  CARDIOVASCULAR: S1, S2 normal. No murmurs, rubs, or gallops.  ABDOMEN: Soft, nontender, nondistended. Bowel sounds present. No organomegaly or mass.  EXTREMITIES: No pedal edema, cyanosis, or clubbing.  NEUROLOGIC: Cranial nerves II through XII are intact. Muscle strength 5/5 in all extremities. Sensation intact. Gait not checked.  PSYCHIATRIC: The patient is alert and oriented x 3.  SKIN: No obvious rash, lesion, or ulcer.   LABORATORY PANEL:   CBC Recent Labs  Lab 01/07/19 1335  WBC 5.0  HGB 10.5*  HCT  32.5*  PLT 212   ------------------------------------------------------------------------------------------------------------------  Chemistries  Recent Labs  Lab 01/07/19 1335  NA 119*  K 5.2*  CL 85*  CO2 21*  GLUCOSE 990*  BUN 25*  CREATININE 1.63*  CALCIUM 8.9   ------------------------------------------------------------------------------------------------------------------  Cardiac Enzymes No results for input(s): TROPONINI in the last 168 hours. ------------------------------------------------------------------------------------------------------------------  RADIOLOGY:  Dg Chest Port 1 View  Result Date: 01/07/2019 CLINICAL DATA:  Nausea and vomiting.  Hyperglycemia EXAM: PORTABLE CHEST 1 VIEW COMPARISON:  Aug 20, 2018 FINDINGS: There is no edema or consolidation. Suspected degree of bullous disease in the upper lobes. Heart size and pulmonary vascularity within normal limits. Pacemaker lead is attached to the right ventricle. Patient is status post coronary artery bypass grafting. No adenopathy. There is evidence of an old healed fracture of the left clavicle. IMPRESSION: Suspect bullous disease in the upper lobes. No edema  or consolidation. Stable cardiac silhouette. Pacemaker lead attached to right ventricle. Electronically Signed   By: Bretta Bang III M.D.   On: 01/07/2019 16:38      IMPRESSION AND PLAN:  Patient is a 48 year old male with history of diabetes mellitus, chronic pains on methadone and chronic systolic and diastolic CHF with ejection fraction of 20% who is being admitted for management of hyperglycemia with blood sugar of 990.  1.  Diabetes mellitus with hyperglycemia Blood sugar of 990.  Anion gap of 13. Patient started on insulin drip.  Already given IV fluid boluses in the emergency room. Appears better hydrated.  Given underlying history of CHF with ejection fraction of 20% will be very cautious with excessive hydration to prevent fluid  overload.  Continued IV fluid with normal saline at 50 cc an hour while monitoring blood sugars closely. Serial BMP every 4 hours.  Glycosylated hemoglobin level in a.m. Being admitted to ICU. Requested for magnesium and phosphorus level.  Replace if low.  2.  Chronic diastolic and systolic CHF Last ejection fraction of 20% in January 2020. Stable at this time.  Holding off on Lasix for now while monitoring cardiopulmonary status closely with ongoing gentle IV fluid hydration  3.  History of chronic pains Patient on methadone.  Continue the same.  4.  Hyponatremia with sodium of 119 Most likely pseudohyponatremia from hyperglycemia. Expect improvement with correction of hyperglycemia.  5.  Hypertension Resume home blood pressure medications.  Monitor.  DVT prophylaxis; Lovenox  All the records are reviewed and case discussed with ED provider. Management plans discussed with the patient, and he is in agreement.  CODE STATUS: Full code  TOTAL TIME TAKING CARE OF THIS PATIENT: 64 minutes.    Regena Delucchi M.D on 01/07/2019 at 5:44 PM  Between 7am to 6pm - Pager - [phone]  After 6pm go to [email or web address] - Social research officer, government  [employer] [facility]  Hospitalists  Office  [phone]  CC: Primary care physician; Dorcas Carrow, DO   Note: This dictation was prepared with Dragon dictation along with smaller phrase technology. Any transcriptional errors that result from this process are unintentional.

## 2019-01-07 NOTE — Consult Note (Signed)
Name: Jesse Christian MRN: [id number] DOB: 1970/09/03    ADMISSION DATE:  01/07/2019 CONSULTATION DATE:  01/07/2019  REFERRING MD :  Dr. Enid Baas  CHIEF COMPLAINT:  Nausea, Vomiting, Hyperglycemia  BRIEF PATIENT DESCRIPTION:  48 y.o. Male with PMH notable for DM, Chronic combined CHF (EF 20% Jan. 2020), drug abuse, chronic pain on methadone, who is admitted 01/07/19 with hyperosmolar hyperglycemic state requiring insulin drip.  SIGNIFICANT EVENTS  9/29>> Admitted to Med-surg unit 9/29>> Transfer to [facility] for insulin drip  STUDIES:  N/A  CULTURES: SARS-CoV-2 PCR 9/29>> negative  ANTIBIOTICS: N/A  HISTORY OF PRESENT ILLNESS:   Jesse Christian is a 48 year old male with a past medical history notable for chronic combined CHF (EF 20% on January 2020), diabetes mellitus, drug abuse, chronic pain on methadone who presented to [facility] ED on 01/07/2019 with complaints of nausea, vomiting, and hyperglycemia.  He reported ongoing nausea and vomiting over the last couple of days.  He denied chest pain, shortness of breath, fever, chills, abdominal pain, diarrhea, hematemesis, melena.  He also reports that he has been compliant with his insulin.  Initial work-up in the ED revealed sodium 119, potassium 5.2, glucose 990, bicarb 21, anion gap 13, hemoglobin 10.5, beta hydroxybutyric acid 0.39, WBC 5.  Urinalysis is negative for ketones and negative for UTI.  Chest x-ray is negative for edema or consolidation, however suspicious for bullous disease in the upper lobes. He received 3L of fluid boluses, and was placed on insulin drip.  He was admitted by hospitalist for further work-up and treatment of hyperosmolar hyperglycemic state requiring insulin drip.  PCCM is consulted for further management.  PAST MEDICAL HISTORY :   has a past medical history of Diabetes mellitus without complication (HCC), Drug abuse (HCC), Hyperlipidemia, and Right arm fracture (2018).  has a past surgical history that includes LEFT  HEART CATH AND CORONARY ANGIOGRAPHY (Right, 05/06/2018); Cardiac surgery; Cardiac surgery (05/2018); and ICD IMPLANT. Prior to Admission medications   Medication Sig Start Date End Date Taking? Authorizing Provider  aspirin 81 MG chewable tablet Chew 81 mg by mouth daily.    Yes [provider]  atorvastatin (LIPITOR) 80 MG tablet Take 80 mg by mouth daily. 07/30/18  Yes [provider]  furosemide (LASIX) 40 MG tablet Take 1 tablet (40 mg total) by mouth daily. 10/16/18  Yes Adrian Saran, MD  LEVEMIR FLEXTOUCH 100 UNIT/ML Pen  10/31/18  Yes [provider]  losartan (COZAAR) 25 MG tablet Take 25 mg by mouth daily. 07/30/18  Yes [provider]  magnesium oxide (MAG-OX) 400 MG tablet Take 400 mg by mouth daily.   Yes [provider]  methadone (DOLOPHINE) 10 MG tablet Take 130 mg by mouth daily.    Yes [provider]  metoCLOPramide (REGLAN) 10 MG tablet Take 5 mg by mouth every 6 (six) hours as needed for nausea/vomiting. 10/02/18  Yes [provider]  metoprolol succinate (TOPROL-XL) 25 MG 24 hr tablet Take 25 mg by mouth daily. 07/30/18  Yes [provider]  insulin aspart (NOVOLOG) 100 UNIT/ML injection Inject 6 Units into the skin 3 (three) times daily before meals. 10/02/18 11/01/18  [provider]  sucralfate (CARAFATE) 1 GM/10ML suspension Take 10 mLs (1 g total) by mouth 4 (four) times daily -  with meals and at bedtime. 09/06/18   Olevia Perches P, DO   Allergies  Allergen Reactions   Lyrica [Pregabalin] Other (See Comments)    Peripheral edema   Metformin And  Related Nausea And Vomiting    FAMILY HISTORY:  family history includes Cancer in his maternal aunt; Heart attack in his father; Heart disease in his father; Mental illness in his mother and sister. SOCIAL HISTORY:  reports that he quit smoking about 8 months ago. His smoking use included cigarettes. He smoked 0.50 packs per day. He has never used  smokeless tobacco. He reports that he does not drink alcohol or use drugs.   COVID-19 DISASTER DECLARATION:  FULL CONTACT PHYSICAL EXAMINATION WAS NOT POSSIBLE DUE TO TREATMENT OF COVID-19 AND  CONSERVATION OF PERSONAL PROTECTIVE EQUIPMENT, LIMITED EXAM FINDINGS INCLUDE-  Patient assessed or the symptoms described in the history of present illness.  In the context of the Global COVID-19 pandemic, which necessitated consideration that the patient might be at risk for infection with the SARS-CoV-2 virus that causes COVID-19, Institutional protocols and algorithms that pertain to the evaluation of patients at risk for COVID-19 are in a state of rapid change based on information released by regulatory bodies including the CDC and federal and state organizations. These policies and algorithms were followed during the patient's care while in hospital.  REVIEW OF SYSTEMS:  Positives in BOLD: Pt currently denies all complaints Constitutional: Negative for fever, chills, weight loss, malaise/fatigue and diaphoresis.  HENT: Negative for hearing loss, ear pain, nosebleeds, congestion, sore throat, neck pain, tinnitus and ear discharge.   Eyes: Negative for blurred vision, double vision, photophobia, pain, discharge and redness.  Respiratory: Negative for cough, hemoptysis, sputum production, shortness of breath, wheezing and stridor.   Cardiovascular: Negative for chest pain, palpitations, orthopnea, claudication, leg swelling and PND.  Gastrointestinal: Negative for heartburn, nausea, vomiting, abdominal pain, diarrhea, constipation, blood in stool and melena.  Genitourinary: Negative for dysuria, urgency, frequency, hematuria and flank pain.  Musculoskeletal: Negative for myalgias, back pain, joint pain and falls.  Skin: Negative for itching and rash.  Neurological: Negative for dizziness, tingling, tremors, sensory change, speech change, focal weakness, seizures, loss of consciousness, weakness and  headaches.  Endo/Heme/Allergies: Negative for environmental allergies and polydipsia. Does not bruise/bleed easily.  SUBJECTIVE:  Pt denies chest pain, shortness of breath, abdominal pain, N/V, fever,chills edema Wants to eat On room air  VITAL SIGNS: Temp:  [98 F (36.7 C)-99 F (37.2 C)] 98 F (36.7 C) (09/29 2035) Pulse Rate:  [78-87] 85 (09/29 2035) Resp:  [11-20] 20 (09/29 2035) BP: (153-165)/(92-98) 154/98 (09/29 2035) SpO2:  [97 %-100 %] 100 % (09/29 2035) Weight:  [68 kg] 68 kg (09/29 1326)  PHYSICAL EXAMINATION: General: Chronically ill-appearing male, sitting in bed, on room air, no acute distress Neuro: Awake, alert and oriented x4, follows commands, no focal deficits, speech clear HEENT: Atraumatic, normocephalic, neck supple, no JVD, pupils PERRLA Cardiovascular: Regular rate and rhythm, S1-S2, no murmurs rubs or gallops, 2+ pulses throughout Lungs: Clear to auscultation bilaterally, even, nonlabored, normal effort Abdomen: Soft, nontender, nondistended, no guarding or rebound tenderness, bowel sounds positive x4 Musculoskeletal: Normal bulk and tone, no deformities, no edema Skin: Warm and dry, no obvious rashes lesions or ulcerations  Recent Labs  Lab 01/07/19 1335 01/07/19 2110  NA 119* 134*  K 5.2* 3.5  CL 85* 102  CO2 21* 23  BUN 25* 20  CREATININE 1.63* 1.17  GLUCOSE 990* 198*   Recent Labs  Lab 01/07/19 1335  HGB 10.5*  HCT 32.5*  WBC 5.0  PLT 212   Dg Chest Port 1 View  Result Date: 01/07/2019 CLINICAL DATA:  Nausea and vomiting.  Hyperglycemia  EXAM: PORTABLE CHEST 1 VIEW COMPARISON:  Aug 20, 2018 FINDINGS: There is no edema or consolidation. Suspected degree of bullous disease in the upper lobes. Heart size and pulmonary vascularity within normal limits. Pacemaker lead is attached to the right ventricle. Patient is status post coronary artery bypass grafting. No adenopathy. There is evidence of an old healed fracture of the left clavicle.  IMPRESSION: Suspect bullous disease in the upper lobes. No edema or consolidation. Stable cardiac silhouette. Pacemaker lead attached to right ventricle. Electronically Signed   By: Lowella Grip III M.D.   On: 01/07/2019 16:38    ASSESSMENT / PLAN:  Hyperosmolar Hyperglycemic State -IV fluids (caustious IVF given CHF hx) -Insulin drip -Follow BMP -Once glucose controlled, will convert to sliding scale insulin and home Levemir dose -CXR & UA without evidence of infection -Check Urine drug screen -Hemoglobin A1c pending -Consult diabetes coordinator, appreciate input  AKI Pseudohyponatremia in setting of markedly elevated glucose (corrected Na given glucose 990 is Na 133) Hyperkalemia>> resolved -Monitor I&O's / urinary output -Follow BMP -Ensure adequate renal perfusion -Avoid nephrotoxic agents as able -Replace electrolytes as indicated -IV Fluids -Hyperkalemia should correct with insulin drip  Chronic combined systolic & diastolic CHF without acute exacerbation (EF 20%) Hx: HTN -Cardiac monitoring -Maintain MAP >65 -Gentle IVF -Hold off on Lasix for now given dehydration w/ HHS  Anemia without overt bleeding -Monitor for S/Sx of bleeding -Trend CBC -Subcu Lovenox for VTE Prophylaxis  -Transfuse for Hgb <7  Chronic Pain -Continue home Methadone          Disposition: Stepdown Goals of care: Full code VTE prophylaxis: Subcu Lovenox Updates: Updated patient at bedside 01/07/2019   Darel Hong, [facility] [place] [facility] Pager: [phone] Cell: [phone]  01/07/2019, 9:53 PM

## 2019-01-07 NOTE — ED Notes (Signed)
ED TO INPATIENT HANDOFF REPORT  ED Nurse Name and Phone #: Judson Roch [phone]   S Name/Age/Gender Jesse Christian 48 y.o. male Room/Bed: ED15A/ED15A  Code Status   Code Status: Full Code  Home/SNF/Other Home Patient oriented to: self, place, time and situation Is this baseline? Yes   Triage Complete: Triage complete  Chief Complaint sob   Triage Note Pt in via POV, reports ongoing N/V and hyperglycemia x past few days, states sugars are uncontrolled with his insulin.    Pt also states, "Its hard to breath when I lay down, I have to lay in a semi-seated position."    Vitals WDL, NAD noted at this time.   Allergies Allergies  Allergen Reactions  . Lyrica [Pregabalin] Other (See Comments)    Peripheral edema  . Metformin And Related Nausea And Vomiting    Level of Care/Admitting Diagnosis ED Disposition    ED Disposition Condition [facility]: [place] [[id number]]  Level of Care: Med-Surg [16]  Covid Evaluation: Asymptomatic Screening Protocol (No Symptoms)  Diagnosis: Hyperglycemia [169678]  Admitting Physician: Otila Back [place]  Attending Physician: Otila Back [[id number]]  Estimated length of stay: past midnight tomorrow  Certification:: I certify this patient will need inpatient services for at least 2 midnights  PT Class (Do Not Modify): Inpatient [101]  PT Acc Code (Do Not Modify): Private [1]       B Medical/Surgery History Past Medical History:  Diagnosis Date  . Diabetes mellitus without complication ([place])   . Drug abuse ([place])   . Hyperlipidemia   . Right arm fracture 2018   Past Surgical History:  Procedure Laterality Date  . CARDIAC SURGERY    . CARDIAC SURGERY  05/2018   double bypass  . ICD IMPLANT    . LEFT HEART CATH AND CORONARY ANGIOGRAPHY Right 05/06/2018   Procedure: LEFT HEART CATH AND CORONARY ANGIOGRAPHY;  Surgeon: Dionisio David, MD;  Location: [facility];  Service: Cardiovascular;   Laterality: Right;     A IV Location/Drains/Wounds Patient Lines/Drains/Airways Status   Active Line/Drains/Airways    Name:   Placement date:   Placement time:   Site:   Days:   Peripheral IV 01/07/19 Right Forearm   01/07/19    1340    Forearm   less than 1   Peripheral IV 01/07/19 Left Antecubital   01/07/19    1630    Antecubital   less than 1          Intake/Output Last 24 hours  Intake/Output Summary (Last 24 hours) at 01/07/2019 1950 Last data filed at 01/07/2019 1938 Gross per 24 hour  Intake 3025.01 ml  Output 1350 ml  Net 1675.01 ml    Labs/Imaging Results for orders placed or performed during the hospital encounter of 01/07/19 (from the past 48 hour(s))  Glucose, capillary     Status: Abnormal   Collection Time: 01/07/19  1:31 PM  Result Value Ref Range   Glucose-Capillary >600 (HH) 70 - 99 mg/dL  Basic metabolic panel     Status: Abnormal   Collection Time: 01/07/19  1:35 PM  Result Value Ref Range   Sodium 119 (LL) 135 - 145 mmol/L    Comment: CRITICAL RESULT CALLED TO, READ BACK BY AND VERIFIED WITH STEPHANIE RUDD @1448  01/07/19 MJU    Potassium 5.2 (H) 3.5 - 5.1 mmol/L   Chloride 85 (L) 98 - 111 mmol/L   CO2 21 (L) 22 - 32  mmol/L   Glucose, Bld 990 (HH) 70 - 99 mg/dL    Comment: CRITICAL RESULT CALLED TO, READ BACK BY AND VERIFIED WITH STEPHANIE RUDD  01/07/19 MJU    BUN 25 (H) 6 - 20 mg/dL   Creatinine, Ser 1.61 (H) 0.61 - 1.24 mg/dL   Calcium 8.9 8.9 - 09.6 mg/dL   GFR calc non Af Amer 50 (L) >60 mL/min   GFR calc Af Amer 58 (L) >60 mL/min   Anion gap 13 5 - 15    Comment: Performed at [facility], [address] [place], [place] [address]  CBC     Status: Abnormal   Collection Time: 01/07/19  1:35 PM  Result Value Ref Range   WBC 5.0 4.0 - 10.5 K/uL   RBC 3.81 (L) 4.22 - 5.81 MIL/uL   Hemoglobin 10.5 (L) 13.0 - 17.0 g/dL   HCT 98.1 (L) 19.1 - 47.8 %   MCV 85.3 80.0 - 100.0 fL   MCH 27.6 26.0 - 34.0 pg   MCHC 32.3 30.0 - 36.0  g/dL   RDW 29.5 62.1 - 30.8 %   Platelets 212 150 - 400 K/uL   nRBC 0.0 0.0 - 0.2 %    Comment: Performed at [facility], [address] [place], [place] [address]  Urinalysis, Complete w Microscopic     Status: Abnormal   Collection Time: 01/07/19  1:35 PM  Result Value Ref Range   Color, Urine COLORLESS (A) YELLOW   APPearance CLEAR (A) CLEAR   Specific Gravity, Urine 1.021 1.005 - 1.030   pH 6.0 5.0 - 8.0   Glucose, UA >=500 (A) NEGATIVE mg/dL   Hgb urine dipstick SMALL (A) NEGATIVE   Bilirubin Urine NEGATIVE NEGATIVE   Ketones, ur NEGATIVE NEGATIVE mg/dL   Protein, ur NEGATIVE NEGATIVE mg/dL   Nitrite NEGATIVE NEGATIVE   Leukocytes,Ua NEGATIVE NEGATIVE   RBC / HPF 0-5 0 - 5 RBC/hpf   WBC, UA 0-5 0 - 5 WBC/hpf   Bacteria, UA NONE SEEN NONE SEEN   Squamous Epithelial / LPF NONE SEEN 0 - 5    Comment: Performed at [facility], [address] [place], [place] [address]  Glucose, capillary     Status: Abnormal   Collection Time: 01/07/19  3:19 PM  Result Value Ref Range   Glucose-Capillary >600 (HH) 70 - 99 mg/dL  Glucose, capillary     Status: Abnormal   Collection Time: 01/07/19  4:11 PM  Result Value Ref Range   Glucose-Capillary >600 (HH) 70 - 99 mg/dL  Blood gas, venous     Status: None   Collection Time: 01/07/19  4:16 PM  Result Value Ref Range   pH, Ven 7.34 7.250 - 7.430   pCO2, Ven 49 44.0 - 60.0 mmHg   pO2, Ven 32.0 32.0 - 45.0 mmHg   Bicarbonate 26.4 20.0 - 28.0 mmol/L   Acid-base deficit 0.0 0.0 - 2.0 mmol/L   O2 Saturation 57.0 %   Patient temperature 37.0    Collection site VEIN    Sample type VEIN     Comment: Performed at [facility], [address] [place], [place] [address]  Beta-hydroxybutyric acid     Status: Abnormal   Collection Time: 01/07/19  4:16 PM  Result Value Ref Range   Beta-Hydroxybutyric Acid 0.39 (H) 0.05 - 0.27 mmol/L    Comment: Performed at [facility], [address] [place],  [place] [address]  Urinalysis, Routine w reflex microscopic  Status: Abnormal   Collection Time: 01/07/19  4:16 PM  Result Value Ref Range   Color, Urine COLORLESS (A) YELLOW   APPearance CLEAR (A) CLEAR   Specific Gravity, Urine 1.013 1.005 - 1.030   pH 7.0 5.0 - 8.0   Glucose, UA >=500 (A) NEGATIVE mg/dL   Hgb urine dipstick SMALL (A) NEGATIVE   Bilirubin Urine NEGATIVE NEGATIVE   Ketones, ur NEGATIVE NEGATIVE mg/dL   Protein, ur 30 (A) NEGATIVE mg/dL   Nitrite NEGATIVE NEGATIVE   Leukocytes,Ua NEGATIVE NEGATIVE   RBC / HPF 0-5 0 - 5 RBC/hpf   WBC, UA 0-5 0 - 5 WBC/hpf   Bacteria, UA NONE SEEN NONE SEEN   Squamous Epithelial / LPF NONE SEEN 0 - 5    Comment: Performed at [facility], [address] [place], [place] [address]  Glucose, capillary     Status: Abnormal   Collection Time: 01/07/19  5:24 PM  Result Value Ref Range   Glucose-Capillary 593 (HH) 70 - 99 mg/dL   Comment 1 Document in Chart   Glucose, capillary     Status: Abnormal   Collection Time: 01/07/19  6:30 PM  Result Value Ref Range   Glucose-Capillary 414 (H) 70 - 99 mg/dL   Comment 1 Document in Chart   Glucose, capillary     Status: Abnormal   Collection Time: 01/07/19  7:31 PM  Result Value Ref Range   Glucose-Capillary 261 (H) 70 - 99 mg/dL   Comment 1 Document in Chart    Dg Chest Port 1 View  Result Date: 01/07/2019 CLINICAL DATA:  Nausea and vomiting.  Hyperglycemia EXAM: PORTABLE CHEST 1 VIEW COMPARISON:  Aug 20, 2018 FINDINGS: There is no edema or consolidation. Suspected degree of bullous disease in the upper lobes. Heart size and pulmonary vascularity within normal limits. Pacemaker lead is attached to the right ventricle. Patient is status post coronary artery bypass grafting. No adenopathy. There is evidence of an old healed fracture of the left clavicle. IMPRESSION: Suspect bullous disease in the upper lobes. No edema or consolidation. Stable cardiac silhouette. Pacemaker lead attached to  right ventricle. Electronically Signed   By: Bretta Bang III M.D.   On: 01/07/2019 16:38    Pending Labs Unresulted Labs (From admission, onward)    Start     Ordered   01/08/19 0500  Basic metabolic panel  Tomorrow morning,   STAT     01/07/19 1744   01/08/19 0500  CBC  Tomorrow morning,   STAT     01/07/19 1744   01/08/19 0500  Magnesium  Tomorrow morning,   STAT     01/07/19 1744   01/08/19 0500  Phosphorus  Tomorrow morning,   STAT     01/07/19 1744   01/08/19 0500  Hemoglobin A1c  Tomorrow morning,   STAT     01/07/19 1744   01/07/19 1742  Magnesium  Once,   STAT     01/07/19 1742   01/07/19 1742  Phosphorus  Once,   STAT     01/07/19 1742   01/07/19 1739  Basic metabolic panel  STAT Now then every 4 hours ,   STAT     01/07/19 1742   01/07/19 1540  SARS CORONAVIRUS 2 (TAT 6-24 HRS) Nasopharyngeal Nasopharyngeal Swab  (Asymptomatic/Tier 2 Patients Labs)  Once,   STAT    Question Answer Comment  Is this test for diagnosis or screening Screening   Symptomatic for COVID-19 as defined by  CDC No   Hospitalized for COVID-19 No   Admitted to ICU for COVID-19 No   Previously tested for COVID-19 No   Resident in a congregate (group) care setting No   Employed in healthcare setting No      01/07/19 1539          Vitals/Pain Today's Vitals   01/07/19 1600 01/07/19 1700 01/07/19 1800 01/07/19 1830  BP:   (!) 158/92 (!) 162/92  Pulse: 78 80 78   Resp: 17 13 14 11   Temp:      TempSrc:      SpO2: 98% 99% 99%   Weight:      Height:      PainSc:   0-No pain     Isolation Precautions No active isolations  Medications Medications  insulin regular, human (MYXREDLIN) 100 units/ 100 mL infusion ( Intravenous Rate/Dose Verify 01/07/19 1938)  aspirin chewable tablet 81 mg (has no administration in time range)  methadone (DOLOPHINE) tablet 130 mg (has no administration in time range)  atorvastatin (LIPITOR) tablet 80 mg (has no administration in time range)  losartan  (COZAAR) tablet 25 mg (has no administration in time range)  metoprolol succinate (TOPROL-XL) 24 hr tablet 25 mg (has no administration in time range)  magnesium oxide (MAG-OX) tablet 400 mg (has no administration in time range)  metoCLOPramide (REGLAN) tablet 5 mg (has no administration in time range)  0.9 %  sodium chloride infusion (has no administration in time range)  enoxaparin (LOVENOX) injection 40 mg (has no administration in time range)  0.9 %  sodium chloride infusion (has no administration in time range)  sodium chloride 0.9 % bolus 1,000 mL (0 mLs Intravenous Stopped 01/07/19 1444)  sodium chloride 0.9 % bolus 1,000 mL (0 mLs Intravenous Stopped 01/07/19 1730)  sodium chloride 0.9 % bolus 1,000 mL (0 mLs Intravenous Stopped 01/07/19 1935)  ondansetron (ZOFRAN-ODT) disintegrating tablet 4 mg (4 mg Oral Given 01/07/19 1801)    Mobility walks Low fall risk   Focused Assessments .   R Recommendations: See Admitting Provider Note  Report given to:   Additional Notes:

## 2019-01-08 DIAGNOSIS — N179 Acute kidney failure, unspecified: Secondary | ICD-10-CM

## 2019-01-08 LAB — CBC
HCT: 30.6 % — ABNORMAL LOW (ref 39.0–52.0)
Hemoglobin: 10.4 g/dL — ABNORMAL LOW (ref 13.0–17.0)
MCH: 28 pg (ref 26.0–34.0)
MCHC: 34 g/dL (ref 30.0–36.0)
MCV: 82.5 fL (ref 80.0–100.0)
Platelets: 199 10*3/uL (ref 150–400)
RBC: 3.71 MIL/uL — ABNORMAL LOW (ref 4.22–5.81)
RDW: 12.5 % (ref 11.5–15.5)
WBC: 7.8 10*3/uL (ref 4.0–10.5)
nRBC: 0 % (ref 0.0–0.2)

## 2019-01-08 LAB — BASIC METABOLIC PANEL
Anion gap: 9 (ref 5–15)
Anion gap: 10 (ref 5–15)
BUN: 17 mg/dL (ref 6–20)
BUN: 15 mg/dL (ref 6–20)
CO2: 25 mmol/L (ref 22–32)
CO2: 26 mmol/L (ref 22–32)
Calcium: 8.6 mg/dL — ABNORMAL LOW (ref 8.9–10.3)
Calcium: 8.6 mg/dL — ABNORMAL LOW (ref 8.9–10.3)
Chloride: 100 mmol/L (ref 98–111)
Chloride: 100 mmol/L (ref 98–111)
Creatinine, Ser: 1.09 mg/dL (ref 0.61–1.24)
Creatinine, Ser: 1.11 mg/dL (ref 0.61–1.24)
GFR calc Af Amer: >60 mL/min (ref >60)
GFR calc Af Amer: >60 mL/min (ref >60)
GFR calc non Af Amer: >60 mL/min (ref >60)
GFR calc non Af Amer: >60 mL/min (ref >60)
Glucose, Bld: 265 mg/dL — ABNORMAL HIGH (ref 70–99)
Glucose, Bld: 320 mg/dL — ABNORMAL HIGH (ref 70–99)
Potassium: 3.2 mmol/L — ABNORMAL LOW (ref 3.5–5.1)
Potassium: 4 mmol/L (ref 3.5–5.1)
Sodium: 134 mmol/L — ABNORMAL LOW (ref 135–145)
Sodium: 136 mmol/L (ref 135–145)

## 2019-01-08 LAB — MAGNESIUM: Magnesium: 1.7 mg/dL (ref 1.7–2.4)

## 2019-01-08 LAB — GLUCOSE, CAPILLARY
Glucose-Capillary: 258 mg/dL — ABNORMAL HIGH (ref 70–99)
Glucose-Capillary: 279 mg/dL — ABNORMAL HIGH (ref 70–99)
Glucose-Capillary: 271 mg/dL — ABNORMAL HIGH (ref 70–99)
Glucose-Capillary: 172 mg/dL — ABNORMAL HIGH (ref 70–99)
Glucose-Capillary: 195 mg/dL — ABNORMAL HIGH (ref 70–99)
Glucose-Capillary: 190 mg/dL — ABNORMAL HIGH (ref 70–99)

## 2019-01-08 LAB — HEMOGLOBIN A1C
Hgb A1c MFr Bld: 14.5 % — ABNORMAL HIGH (ref 4.8–5.6)
Mean Plasma Glucose: 369.45 mg/dL

## 2019-01-08 LAB — SARS CORONAVIRUS 2 (TAT 6-24 HRS): SARS Coronavirus 2: NEGATIVE

## 2019-01-08 LAB — PHOSPHORUS: Phosphorus: 3.4 mg/dL (ref 2.5–4.6)

## 2019-01-08 MED ORDER — PNEUMOCOCCAL VAC POLYVALENT 25 MCG/0.5ML IJ INJ: 0.5000 mL | INJECTION | INTRAMUSCULAR | Status: DC

## 2019-01-08 MED ORDER — POTASSIUM CHLORIDE CRYS ER 20 MEQ PO TBCR
40.0000 meq | EXTENDED_RELEASE_TABLET | Freq: Once | ORAL | Status: AC
  Administered 2019-01-08: 40 meq via ORAL
  Filled 2019-01-08: qty 2

## 2019-01-08 MED ORDER — INSULIN ASPART 100 UNIT/ML ~~LOC~~ SOLN: 0.0000 [IU] | Freq: Every day | SUBCUTANEOUS | Status: DC

## 2019-01-08 MED ORDER — INSULIN ASPART 100 UNIT/ML ~~LOC~~ SOLN
5.0000 [IU] | Freq: Once | SUBCUTANEOUS | Status: AC
  Administered 2019-01-08: 5 [IU] via SUBCUTANEOUS
  Filled 2019-01-08: qty 1

## 2019-01-08 MED ORDER — INSULIN ASPART 100 UNIT/ML ~~LOC~~ SOLN
4.0000 [IU] | Freq: Three times a day (TID) | SUBCUTANEOUS | Status: DC
  Administered 2019-01-08 – 2019-01-09 (×3): 4 [IU] via SUBCUTANEOUS
  Filled 2019-01-08 (×3): qty 1

## 2019-01-08 MED ORDER — INSULIN ASPART 100 UNIT/ML ~~LOC~~ SOLN
0.0000 [IU] | Freq: Three times a day (TID) | SUBCUTANEOUS | Status: DC
  Administered 2019-01-08: 8 [IU] via SUBCUTANEOUS
  Filled 2019-01-08: qty 1

## 2019-01-08 MED ORDER — INSULIN DETEMIR 100 UNIT/ML ~~LOC~~ SOLN
10.0000 [IU] | Freq: Once | SUBCUTANEOUS | Status: AC
  Administered 2019-01-08: 10 [IU] via SUBCUTANEOUS
  Filled 2019-01-08: qty 0.1

## 2019-01-08 MED ORDER — INFLUENZA VAC SPLIT QUAD 0.5 ML IM SUSY
0.5000 mL | PREFILLED_SYRINGE | INTRAMUSCULAR | Status: AC
  Administered 2019-01-09: 0.5 mL via INTRAMUSCULAR
  Filled 2019-01-08: qty 0.5

## 2019-01-08 MED ORDER — INSULIN DETEMIR 100 UNIT/ML ~~LOC~~ SOLN
30.0000 [IU] | Freq: Every day | SUBCUTANEOUS | Status: DC
  Administered 2019-01-08: 30 [IU] via SUBCUTANEOUS
  Filled 2019-01-08 (×2): qty 0.3

## 2019-01-08 MED ORDER — INSULIN ASPART 100 UNIT/ML ~~LOC~~ SOLN
0.0000 [IU] | Freq: Three times a day (TID) | SUBCUTANEOUS | Status: DC
  Administered 2019-01-08 (×2): 2 [IU] via SUBCUTANEOUS
  Administered 2019-01-09: 5 [IU] via SUBCUTANEOUS
  Filled 2019-01-08 (×3): qty 1

## 2019-01-08 MED ORDER — ACETAMINOPHEN 325 MG PO TABS
650.0000 mg | ORAL_TABLET | Freq: Four times a day (QID) | ORAL | Status: DC | PRN
  Administered 2019-01-08: 650 mg via ORAL
  Filled 2019-01-08: qty 2

## 2019-01-08 NOTE — Progress Notes (Signed)
Inpatient Diabetes Program Recommendations  AACE/ADA: New Consensus Statement on Inpatient Glycemic Control (2015)  Target Ranges:  Prepandial:   less than 140 mg/dL      Peak postprandial:   less than 180 mg/dL (1-2 hours)      Critically ill patients:  140 - 180 mg/dL   Lab Results  Component Value Date   GLUCAP 271 (H) 01/08/2019   HGBA1C 14.0 (H) 08/21/2018    Review of Glycemic Control Results for Jesse Christian, Jesse Christian (MRN [id number]) as of 01/08/2019 09:53  Ref. Range 01/08/2019 01:37 01/08/2019 05:58 01/08/2019 07:15  Glucose-Capillary Latest Ref Range: 70 - 99 mg/dL 258 (H) 279 (H) 271 (H)   Diabetes history: DM2 Outpatient Diabetes medications: Levemir 30 units + Humalog 6 units tid meal coverage Current orders for Inpatient glycemic control: Levemir 30 units + Novolog moderate correction tid + hs 0-5 units  Inpatient Diabetes Program Recommendations:   Noted A1c pending. Patient sees Dr. Gabriel Carina for endocrinology Since patient is eating: -Novolog 4 units tid if eats 50% meals -Decrease Novolog correction to sensitive tid + hs 0-5  Thank you, Nani Gasser. Margarie Mcguirt, RN, MSN, CDE  Diabetes Coordinator Inpatient Glycemic Control Team Team Pager [phone] (8am-5pm) 01/08/2019 10:24 AM

## 2019-01-08 NOTE — Progress Notes (Signed)
[place] [facility] [place] NAME: Jesse Christian    MR#:  [id number]  DATE OF BIRTH:  1970-10-24  SUBJECTIVE:  CHIEF COMPLAINT:   Chief Complaint  Patient presents with  . Nausea  . Hyperglycemia  No complaints, wants to eat REVIEW OF SYSTEMS:  Review of Systems  Constitutional: Negative for diaphoresis, fever, malaise/fatigue and weight loss.  HENT: Negative for ear discharge, ear pain, hearing loss, nosebleeds, sore throat and tinnitus.   Eyes: Negative for blurred vision and pain.  Respiratory: Negative for cough, hemoptysis, shortness of breath and wheezing.   Cardiovascular: Negative for chest pain, palpitations, orthopnea and leg swelling.  Gastrointestinal: Negative for abdominal pain, blood in stool, constipation, diarrhea, heartburn, nausea and vomiting.  Genitourinary: Negative for dysuria, frequency and urgency.  Musculoskeletal: Negative for back pain and myalgias.  Skin: Negative for itching and rash.  Neurological: Negative for dizziness, tingling, tremors, focal weakness, seizures, weakness and headaches.  Psychiatric/Behavioral: Negative for depression. The patient is not nervous/anxious.     DRUG ALLERGIES:   Allergies  Allergen Reactions  . Lyrica [Pregabalin] Other (See Comments)    Peripheral edema  . Metformin And Related Nausea And Vomiting   VITALS:  Blood pressure 132/87, pulse 77, temperature 98 F (36.7 C), temperature source Oral, resp. rate (!) 8, height 6' (1.829 m), weight 72.8 kg, SpO2 100 %. PHYSICAL EXAMINATION:  Physical Exam HENT:     Head: Normocephalic and atraumatic.  Eyes:     Conjunctiva/sclera: Conjunctivae normal.     Pupils: Pupils are equal, round, and reactive to light.  Neck:     Musculoskeletal: Normal range of motion and neck supple.     Thyroid: No thyromegaly.     Trachea: No tracheal deviation.  Cardiovascular:     Rate and Rhythm: Normal rate and regular rhythm.   Heart sounds: Normal heart sounds.  Pulmonary:     Effort: Pulmonary effort is normal. No respiratory distress.     Breath sounds: Normal breath sounds. No wheezing.  Chest:     Chest wall: No tenderness.  Abdominal:     General: Bowel sounds are normal. There is no distension.     Palpations: Abdomen is soft.     Tenderness: There is no abdominal tenderness.  Musculoskeletal: Normal range of motion.  Skin:    General: Skin is warm and dry.     Findings: No rash.  Neurological:     Mental Status: He is alert and oriented to person, place, and time.     Cranial Nerves: No cranial nerve deficit.    LABORATORY PANEL:  Male CBC Recent Labs  Lab 01/08/19 0456  WBC 7.8  HGB 10.4*  HCT 30.6*  PLT 199   ------------------------------------------------------------------------------------------------------------------ Chemistries  Recent Labs  Lab 01/08/19 0456  NA 136  K 4.0  CL 100  CO2 26  GLUCOSE 320*  BUN 15  CREATININE 1.11  CALCIUM 8.6*  MG 1.7   RADIOLOGY:  Dg Chest Port 1 View  Result Date: 01/07/2019 CLINICAL DATA:  Nausea and vomiting.  Hyperglycemia EXAM: PORTABLE CHEST 1 VIEW COMPARISON:  Aug 20, 2018 FINDINGS: There is no edema or consolidation. Suspected degree of bullous disease in the upper lobes. Heart size and pulmonary vascularity within normal limits. Pacemaker lead is attached to the right ventricle. Patient is status post coronary artery bypass grafting. No adenopathy. There is evidence of an old healed fracture of the  left clavicle. IMPRESSION: Suspect bullous disease in the upper lobes. No edema or consolidation. Stable cardiac silhouette. Pacemaker lead attached to right ventricle. Electronically Signed   By: Bretta Bang III M.D.   On: 01/07/2019 16:38   ASSESSMENT AND PLAN:  Patient is a 48 year old male with history of diabetes mellitus, chronic pains on methadone and chronic systolic and diastolic CHF with ejection fraction of 20% who is  being admitted for management of hyperglycemia with blood sugar of 990.  1.  Diabetes mellitus with hyperglycemia -Now resolved  2.  Chronic diastolic and systolic CHF Last ejection fraction of 20% in January 2020. Stable at this time. Well compensated at this time  3.  History of chronic pains - continue methadone  4.  Hyponatremia with sodium of 119 resolved  5.  Hypertension Resume home blood pressure medications.  Monitor.  DVT prophylaxis; Lovenox  Can be transferred to floor   All the records are reviewed and case discussed with Care Management/Social Worker. Management plans discussed with the patient, nursing and they are in agreement.  CODE STATUS: Full Code  TOTAL TIME TAKING CARE OF THIS PATIENT: 35 minutes.   More than 50% of the time was spent in counseling/coordination of care: YES  POSSIBLE D/C IN 1 DAYS, DEPENDING ON CLINICAL CONDITION.   Delfino Lovett M.D on 01/08/2019 at 3:05 PM  Between 7am to 6pm - Pager - [phone]  After 6pm go to [email or web address] - Social research officer, government  [facility]  Office  [phone]  CC: Primary care physician; Dorcas Carrow, DO  Note: This dictation was prepared with Dragon dictation along with smaller phrase technology. Any transcriptional errors that result from this process are unintentional.

## 2019-01-09 LAB — CBC WITH DIFFERENTIAL/PLATELET
Abs Immature Granulocytes: 0.02 10*3/uL (ref 0.00–0.07)
Basophils Absolute: 0.1 10*3/uL (ref 0.0–0.1)
Basophils Relative: 1 %
Eosinophils Absolute: 0.3 10*3/uL (ref 0.0–0.5)
Eosinophils Relative: 5 %
HCT: 31.8 % — ABNORMAL LOW (ref 39.0–52.0)
Hemoglobin: 10.5 g/dL — ABNORMAL LOW (ref 13.0–17.0)
Immature Granulocytes: 0 %
Lymphocytes Relative: 46 %
Lymphs Abs: 3.2 10*3/uL (ref 0.7–4.0)
MCH: 28 pg (ref 26.0–34.0)
MCHC: 33 g/dL (ref 30.0–36.0)
MCV: 84.8 fL (ref 80.0–100.0)
Monocytes Absolute: 0.5 10*3/uL (ref 0.1–1.0)
Monocytes Relative: 7 %
Neutro Abs: 2.8 10*3/uL (ref 1.7–7.7)
Neutrophils Relative %: 41 %
Platelets: 237 10*3/uL (ref 150–400)
RBC: 3.75 MIL/uL — ABNORMAL LOW (ref 4.22–5.81)
RDW: 13 % (ref 11.5–15.5)
WBC: 6.9 10*3/uL (ref 4.0–10.5)
nRBC: 0 % (ref 0.0–0.2)

## 2019-01-09 LAB — BASIC METABOLIC PANEL
Anion gap: 6 (ref 5–15)
BUN: 18 mg/dL (ref 6–20)
CO2: 26 mmol/L (ref 22–32)
Calcium: 8.9 mg/dL (ref 8.9–10.3)
Chloride: 104 mmol/L (ref 98–111)
Creatinine, Ser: 1.27 mg/dL — ABNORMAL HIGH (ref 0.61–1.24)
GFR calc Af Amer: >60 mL/min (ref >60)
GFR calc non Af Amer: >60 mL/min (ref >60)
Glucose, Bld: 287 mg/dL — ABNORMAL HIGH (ref 70–99)
Potassium: 4.1 mmol/L (ref 3.5–5.1)
Sodium: 136 mmol/L (ref 135–145)

## 2019-01-09 LAB — GLUCOSE, CAPILLARY: Glucose-Capillary: 271 mg/dL — ABNORMAL HIGH (ref 70–99)

## 2019-01-09 NOTE — Discharge Summary (Signed)
[facility] - [place] at [facility]   PATIENT NAME: Jesse Christian    MR#:  [id number]  DATE OF BIRTH:  [birth year]  DATE OF ADMISSION:  01/07/2019   ADMITTING PHYSICIAN: Jama FlavorsJude Ojie, MD  DATE OF DISCHARGE: 01/09/2019  9:09 AM  PRIMARY CARE PHYSICIAN: Dorcas CarrowJohnson, [name], DO   ADMISSION DIAGNOSIS:  sob  DISCHARGE DIAGNOSIS:  Active Problems:   Hyperglycemia   AKI (acute kidney injury) (HCC)  SECONDARY DIAGNOSIS:   Past Medical History:  Diagnosis Date   Diabetes mellitus without complication (HCC)    Drug abuse (HCC)    Hyperlipidemia    Right arm fracture 2018   HOSPITAL COURSE:  Patient is a 48 year old male with history of diabetes mellitus,chronic pains on methadone and chronic systolic and diastolic CHF with ejection fraction of 20% who is being admitted for management of hyperglycemia with blood sugar of 990.  1.Diabetes mellitus with hyperglycemia -Now resolved  2.Chronic diastolic and systolic CHF Last ejection fraction of 20% in January 2020. Well compensated at this time  3.History of chronic pains - continue methadone  4.Hyponatremia with sodium of 119 resolved  5.Hypertension Stable on home BP meds DISCHARGE CONDITIONS:  stable CONSULTS OBTAINED:   DRUG ALLERGIES:   Allergies  Allergen Reactions   Lyrica [Pregabalin] Other (See Comments)    Peripheral edema   Metformin And Related Nausea And Vomiting   DISCHARGE MEDICATIONS:   Allergies as of 01/09/2019      Reactions   Lyrica [pregabalin] Other (See Comments)   Peripheral edema   Metformin And Related Nausea And Vomiting      Medication List    TAKE these medications   aspirin 81 MG chewable tablet Chew 81 mg by mouth daily.   atorvastatin 80 MG tablet Commonly known as: LIPITOR Take 80 mg by mouth daily.   furosemide 40 MG tablet Commonly known as: LASIX Take 1 tablet (40 mg total) by mouth daily.   Levemir FlexTouch 100 UNIT/ML  Pen Generic drug: Insulin Detemir   losartan 25 MG tablet Commonly known as: COZAAR Take 25 mg by mouth daily.   magnesium oxide 400 MG tablet Commonly known as: MAG-OX Take 400 mg by mouth daily.   methadone 10 MG tablet Commonly known as: DOLOPHINE Take 130 mg by mouth daily.   metoCLOPramide 10 MG tablet Commonly known as: REGLAN Take 5 mg by mouth every 6 (six) hours as needed for nausea/vomiting.   metoprolol succinate 25 MG 24 hr tablet Commonly known as: TOPROL-XL Take 25 mg by mouth daily.   NovoLOG 100 UNIT/ML injection Generic drug: insulin aspart Inject 6 Units into the skin 3 (three) times daily before meals.   sucralfate 1 GM/10ML suspension Commonly known as: Carafate Take 10 mLs (1 g total) by mouth 4 (four) times daily -  with meals and at bedtime.        DISCHARGE INSTRUCTIONS:   DIET:  Diabetic diet DISCHARGE CONDITION:  Stable ACTIVITY:  Activity as tolerated OXYGEN:  Home Oxygen: No.  Oxygen Delivery: room air DISCHARGE LOCATION:  home   If you experience worsening of your admission symptoms, develop shortness of breath, life threatening emergency, suicidal or homicidal thoughts you must seek medical attention immediately by calling 911 or calling your MD immediately  if symptoms less severe.  You Must read complete instructions/literature along with all the possible adverse reactions/side effects for all the Medicines you take and that have been prescribed to you. Take any new Medicines after you  have completely understood and accpet all the possible adverse reactions/side effects.   Please note  You were cared for by a hospitalist during your hospital stay. If you have any questions about your discharge medications or the care you received while you were in the hospital after you are discharged, you can call the unit and asked to speak with the hospitalist on call if the hospitalist that took care of you is not available. Once you are  discharged, your primary care physician will handle any further medical issues. Please note that NO REFILLS for any discharge medications will be authorized once you are discharged, as it is imperative that you return to your primary care physician (or establish a relationship with a primary care physician if you do not have one) for your aftercare needs so that they can reassess your need for medications and monitor your lab values.    On the day of Discharge:  VITAL SIGNS:  Blood pressure 119/72, pulse 64, temperature 98 F (36.7 C), temperature source Oral, resp. rate (!) 9, height 6' (1.829 m), weight 72.8 kg, SpO2 100 %. PHYSICAL EXAMINATION:  GENERAL:  48 y.o.-year-old patient lying in the bed with no acute distress.  EYES: Pupils equal, round, reactive to light and accommodation. No scleral icterus. Extraocular muscles intact.  HEENT: Head atraumatic, normocephalic. Oropharynx and nasopharynx clear.  NECK:  Supple, no jugular venous distention. No thyroid enlargement, no tenderness.  LUNGS: Normal breath sounds bilaterally, no wheezing, rales,rhonchi or crepitation. No use of accessory muscles of respiration.  CARDIOVASCULAR: S1, S2 normal. No murmurs, rubs, or gallops.  ABDOMEN: Soft, non-tender, non-distended. Bowel sounds present. No organomegaly or mass.  EXTREMITIES: No pedal edema, cyanosis, or clubbing.  NEUROLOGIC: Cranial nerves II through XII are intact. Muscle strength 5/5 in all extremities. Sensation intact. Gait not checked.  PSYCHIATRIC: The patient is alert and oriented x 3.  SKIN: No obvious rash, lesion, or ulcer.  DATA REVIEW:   CBC Recent Labs  Lab 01/09/19 0528  WBC 6.9  HGB 10.5*  HCT 31.8*  PLT 237    Chemistries  Recent Labs  Lab 01/08/19 0456 01/09/19 0528  NA 136 136  K 4.0 4.1  CL 100 104  CO2 26 26  GLUCOSE 320* 287*  BUN 15 18  CREATININE 1.11 1.27*  CALCIUM 8.6* 8.9  MG 1.7  --      Follow-up Information    [facility] Follow up on 01/20/2019.   Specialty: Cardiology Why: at 3:00pm. Please enter through the Medical Mall entrance Contact information: [address] [place] [address] [phone]       Olevia Perches P, DO. Schedule an appointment as soon as possible for a visit in 1 week(s).   Specialty: Family Medicine Contact information: [address] [place] [address] [phone]        Raj Janus, MD. Schedule an appointment as soon as possible for a visit in 5 days.   Specialty: Endocrinology Why: office not open at this time 0836am have pt call get appt within 5 days Contact information: [address] [facility] [place] [address] [phone]        Raj Janus, MD.   Specialty: Endocrinology Contact information: [address] [place] [address] [place] [address] [phone]        Olevia Perches P, DO In 1 week.   Specialty: Family Medicine Why: APPT Laural Benes, MEGAN  DR [facility] OFFICE OCT. 8 TH AT 2:30PM Contact information: [place] [address] [phone]        Judi Cong, MD.   Specialty: Endocrinology Contact information: [place] [address] [phone]           Management plans discussed with the patient, family and they are in agreement.  CODE STATUS: Prior   TOTAL TIME TAKING CARE OF THIS PATIENT: 45 minutes.    Max Sane M.D on 01/09/2019 at 7:07 PM  Between 7am to 6pm - Pager - [phone]  After 6pm go to [email or web address] - Proofreader  [facility] [place]  Office  [phone]  CC: Primary care physician; Valerie Roys, DO   Note: This dictation was prepared with Dragon dictation along with smaller phrase technology. Any transcriptional errors that result from this process are unintentional.

## 2019-01-09 NOTE — Discharge Instructions (Signed)

## 2019-01-16 ENCOUNTER — Ambulatory Visit (INDEPENDENT_AMBULATORY_CARE_PROVIDER_SITE_OTHER): Payer: 59 | Admitting: Family Medicine

## 2019-01-16 ENCOUNTER — Other Ambulatory Visit: Payer: Self-pay

## 2019-01-16 ENCOUNTER — Encounter: Payer: Self-pay | Admitting: Family Medicine

## 2019-01-16 VITALS — BP 142/87 | HR 80 | Temp 98.4°F | Ht 72.0 in | Wt 163.0 lb

## 2019-01-16 DIAGNOSIS — N179 Acute kidney failure, unspecified: Secondary | ICD-10-CM

## 2019-01-16 DIAGNOSIS — E1100 Type 2 diabetes mellitus with hyperosmolarity without nonketotic hyperglycemic-hyperosmolar coma (NKHHC): Secondary | ICD-10-CM | POA: Diagnosis not present

## 2019-01-16 DIAGNOSIS — E782 Mixed hyperlipidemia: Secondary | ICD-10-CM

## 2019-01-16 DIAGNOSIS — I129 Hypertensive chronic kidney disease with stage 1 through stage 4 chronic kidney disease, or unspecified chronic kidney disease: Secondary | ICD-10-CM | POA: Diagnosis not present

## 2019-01-16 LAB — UA/M W/RFLX CULTURE, ROUTINE
Bilirubin, UA: NEGATIVE
Ketones, UA: NEGATIVE
Leukocytes,UA: NEGATIVE
Nitrite, UA: NEGATIVE
Specific Gravity, UA: 1.015 (ref 1.005–1.030)
Urobilinogen, Ur: 0.2 mg/dL (ref 0.2–1.0)
pH, UA: 5 (ref 5.0–7.5)

## 2019-01-16 LAB — MICROSCOPIC EXAMINATION
Bacteria, UA: NONE SEEN
WBC, UA: NONE SEEN /hpf (ref 0–5)

## 2019-01-16 LAB — BAYER DCA HB A1C WAIVED: HB A1C (BAYER DCA - WAIVED): 13.7 % — ABNORMAL HIGH (ref <7.0)

## 2019-01-16 LAB — MICROALBUMIN, URINE WAIVED
Creatinine, Urine Waived: 50 mg/dL (ref 10–300)
Microalb, Ur Waived: 150 mg/L — ABNORMAL HIGH (ref 0–19)
Microalb/Creat Ratio: >300 mg/g — ABNORMAL HIGH (ref <30)

## 2019-01-16 MED ORDER — LEVEMIR FLEXTOUCH 100 UNIT/ML ~~LOC~~ SOPN: 35.0000 [IU] | PEN_INJECTOR | Freq: Every day | SUBCUTANEOUS | 1 refills | Status: DC

## 2019-01-16 NOTE — Progress Notes (Signed)
BP (!) 142/87   Pulse 80   Temp 98.4 F (36.9 C) (Oral)   Ht 6' (1.829 m)   Wt 163 lb (73.9 kg)   SpO2 98%   BMI 22.11 kg/m    Subjective:    Patient ID: Jesse Christian, male    DOB: [birth year], 48 y.o.   MRN: [id number]  HPI: Jesse Christian is a 48 y.o. male  Chief Complaint  Patient presents with  . Hospitalization Follow-up   Gautam presents today for evaluation after being hospitalized for hyperglycemia  Transition of Care Hospital Follow up.   Hospital/Facility:  [facility] D/C Physician: Dr. Sherryll Burger D/C Date: 01/09/19  Records Requested: 01/09/19  Records Received: 01/09/19   Records Reviewed: 01/09/19   Diagnoses on Discharge: AKI, Hyperglycermia  Date of interactive Contact within 48 hours of discharge: N/A Contact was through:  Not done  Date of 7 day or 14 day face-to-face visit: 01/16/19   within 7 days  Outpatient Encounter Medications as of 01/16/2019  Medication Sig Note  . aspirin 81 MG chewable tablet Chew 81 mg by mouth daily.    Marland Kitchen atorvastatin (LIPITOR) 80 MG tablet Take 80 mg by mouth daily.   . furosemide (LASIX) 40 MG tablet Take 1 tablet (40 mg total) by mouth daily.   Marland Kitchen LEVEMIR FLEXTOUCH 100 UNIT/ML Pen Inject 35 Units into the skin at bedtime.   Marland Kitchen losartan (COZAAR) 25 MG tablet Take 25 mg by mouth daily.   . magnesium oxide (MAG-OX) 400 MG tablet Take 400 mg by mouth daily.   . methadone (DOLOPHINE) 10 MG tablet Take 130 mg by mouth daily.    . metoCLOPramide (REGLAN) 10 MG tablet Take 5 mg by mouth every 6 (six) hours as needed for nausea/vomiting.   . metoprolol succinate (TOPROL-XL) 25 MG 24 hr tablet Take 25 mg by mouth daily.   . sucralfate (CARAFATE) 1 GM/10ML suspension Take 10 mLs (1 g total) by mouth 4 (four) times daily -  with meals and at bedtime. 01/16/2019: As needed  . [DISCONTINUED] LEVEMIR FLEXTOUCH 100 UNIT/ML Pen    . insulin aspart (NOVOLOG) 100 UNIT/ML injection Inject 6 Units into the skin 3 (three) times daily before meals.     No facility-administered encounter medications on file as of 01/16/2019.   Per hospitalist:  "HOSPITAL COURSE:  Patient is a 48 year old male with history of diabetes mellitus,chronic pains on methadone and chronic systolic and diastolic CHF with ejection fraction of 20% who is being admitted for management of hyperglycemia with blood sugar of 990.  1.Diabetes mellitus with hyperglycemia -Now resolved  2.Chronic diastolic and systolic CHF Last ejection fraction of 20% in January 2020. Well compensated at this time  3.History of chronic pains - continuemethadone  4.Hyponatremia with sodium of 119 resolved  5.Hypertension Stable on home BP meds"   Diagnostic Tests Reviewed: CLINICAL DATA:  Nausea and vomiting.  Hyperglycemia  EXAM: PORTABLE CHEST 1 VIEW  COMPARISON:  Aug 20, 2018  FINDINGS: There is no edema or consolidation. Suspected degree of bullous disease in the upper lobes. Heart size and pulmonary vascularity within normal limits. Pacemaker lead is attached to the right ventricle. Patient is status post coronary artery bypass grafting. No adenopathy. There is evidence of an old healed fracture of the left clavicle.  IMPRESSION: Suspect bullous disease in the upper lobes. No edema or consolidation. Stable cardiac silhouette. Pacemaker lead attached to right ventricle.  Disposition: Home  Consults:  None  Discharge Instructions:  Follow  up here  Disease/illness Education: Discussed today  Home Health/Community Services Discussions/Referrals: N/A  Establishment or re-establishment of referral orders for community resources: N/A  Discussion with other health care providers: N/A  Assessment and Support of treatment regimen adherence:  Fair  Appointments Coordinated with:  Patient  Education for self-management, independent living, and ADLs:  N/A  Since getting out of the hospital he has been feeling better. He notes that his stomach  has been doing poorly again. Has not seen Dr. Tedd Sias. Sugars have been running in the 300-400s. He has been taking 30mg  of of levemir at night. In the AM he is running in the 200s. Still taking a sliding scale.   DIABETES Hypoglycemic episodes:occasionally- about 2x a month, starts feeling back about 85 Polydipsia/polyuria: yes Visual disturbance: yes Chest pain: no Paresthesias: yes Glucose Monitoring: no  Accucheck frequency: TID Taking Insulin?: yes Blood Pressure Monitoring: not checking Retinal Examination: Not up to Date Foot Exam: Up to Date Diabetic Education: Not Completed Pneumovax: Up to Date Influenza: Up to Date Aspirin: no  Relevant past medical, surgical, family and social history reviewed and updated as indicated. Interim medical history since our last visit reviewed. Allergies and medications reviewed and updated.  Review of Systems  Constitutional: Negative.   Respiratory: Negative.   Cardiovascular: Negative.   Musculoskeletal: Negative.   Neurological: Negative.   Psychiatric/Behavioral: Negative.     Per HPI unless specifically indicated above     Objective:    BP (!) 142/87   Pulse 80   Temp 98.4 F (36.9 C) (Oral)   Ht 6' (1.829 m)   Wt 163 lb (73.9 kg)   SpO2 98%   BMI 22.11 kg/m   Wt Readings from Last 3 Encounters:  01/16/19 163 lb (73.9 kg)  01/07/19 160 lb 7.9 oz (72.8 kg)  10/15/18 154 lb 15.7 oz (70.3 kg)    Physical Exam Vitals signs and nursing note reviewed.  Constitutional:      General: He is not in acute distress.    Appearance: Normal appearance. He is not ill-appearing, toxic-appearing or diaphoretic.  HENT:     Head: Normocephalic and atraumatic.     Right Ear: External ear normal.     Left Ear: External ear normal.     Nose: Nose normal.     Mouth/Throat:     Mouth: Mucous membranes are moist.     Pharynx: Oropharynx is clear.  Eyes:     General: No scleral icterus.       Right eye: No discharge.        Left eye:  No discharge.     Extraocular Movements: Extraocular movements intact.     Conjunctiva/sclera: Conjunctivae normal.     Pupils: Pupils are equal, round, and reactive to light.  Neck:     Musculoskeletal: Normal range of motion and neck supple.  Cardiovascular:     Rate and Rhythm: Normal rate and regular rhythm.     Pulses: Normal pulses.     Heart sounds: Normal heart sounds. No murmur. No friction rub. No gallop.   Pulmonary:     Effort: Pulmonary effort is normal. No respiratory distress.     Breath sounds: Normal breath sounds. No stridor. No wheezing, rhonchi or rales.  Chest:     Chest wall: No tenderness.  Musculoskeletal: Normal range of motion.  Skin:    General: Skin is warm and dry.     Capillary Refill: Capillary refill takes less than 2 seconds.  Coloration: Skin is not jaundiced or pale.     Findings: No bruising, erythema, lesion or rash.  Neurological:     General: No focal deficit present.     Mental Status: He is alert and oriented to person, place, and time. Mental status is at baseline.  Psychiatric:        Mood and Affect: Mood normal.        Behavior: Behavior normal.        Thought Content: Thought content normal.        Judgment: Judgment normal.     Results for orders placed or performed in visit on 01/16/19  Microscopic Examination   BLD  Result Value Ref Range   WBC, UA None seen 0 - 5 /hpf   RBC 3-10 (A) 0 - 2 /hpf   Epithelial Cells (non renal) 0-10 0 - 10 /hpf   Bacteria, UA None seen None seen/Few  CBC with Differential/Platelet  Result Value Ref Range   WBC 5.6 3.4 - 10.8 x10E3/uL   RBC 3.63 (L) 4.14 - 5.80 x10E6/uL   Hemoglobin 10.2 (L) 13.0 - 17.7 g/dL   Hematocrit 21.332.1 (L) 08.637.5 - 51.0 %   MCV 88 79 - 97 fL   MCH 28.1 26.6 - 33.0 pg   MCHC 31.8 31.5 - 35.7 g/dL   RDW 57.813.1 46.911.6 - 62.915.4 %   Platelets 270 150 - 450 x10E3/uL   Neutrophils 40 Not Estab. %   Lymphs 45 Not Estab. %   Monocytes 8 Not Estab. %   Eos 6 Not Estab. %    Basos 1 Not Estab. %   Neutrophils Absolute 2.3 1.4 - 7.0 x10E3/uL   Lymphocytes Absolute 2.6 0.7 - 3.1 x10E3/uL   Monocytes Absolute 0.4 0.1 - 0.9 x10E3/uL   EOS (ABSOLUTE) 0.3 0.0 - 0.4 x10E3/uL   Basophils Absolute 0.1 0.0 - 0.2 x10E3/uL   Immature Granulocytes 0 Not Estab. %   Immature Grans (Abs) 0.0 0.0 - 0.1 x10E3/uL  Bayer DCA Hb A1c Waived  Result Value Ref Range   HB A1C (BAYER DCA - WAIVED) 13.7 (H) <7.0 %  Comprehensive metabolic panel  Result Value Ref Range   Glucose 415 (H) 65 - 99 mg/dL   BUN 25 (H) 6 - 24 mg/dL   Creatinine, Ser 5.281.59 (H) 0.76 - 1.27 mg/dL   GFR calc non Af Amer 51 (L) >59 mL/min/1.73   GFR calc Af Amer 59 (L) >59 mL/min/1.73   BUN/Creatinine Ratio 16 9 - 20   Sodium 130 (L) 134 - 144 mmol/L   Potassium 4.1 3.5 - 5.2 mmol/L   Chloride 94 (L) 96 - 106 mmol/L   CO2 20 20 - 29 mmol/L   Calcium 9.1 8.7 - 10.2 mg/dL   Total Protein 6.9 6.0 - 8.5 g/dL   Albumin 4.2 4.0 - 5.0 g/dL   Globulin, Total 2.7 1.5 - 4.5 g/dL   Albumin/Globulin Ratio 1.6 1.2 - 2.2   Bilirubin Total 0.3 0.0 - 1.2 mg/dL   Alkaline Phosphatase 139 (H) 39 - 117 IU/L   AST 20 0 - 40 IU/L   ALT 15 0 - 44 IU/L  Lipid Panel w/o Chol/HDL Ratio  Result Value Ref Range   Cholesterol, Total 159 100 - 199 mg/dL   Triglycerides 413208 (H) 0 - 149 mg/dL   HDL 58 >24>39 mg/dL   VLDL Cholesterol Cal 34 5 - 40 mg/dL   LDL Chol Calc (NIH) 67 0 - 99 mg/dL  TSH  Result Value Ref Range   TSH 0.480 0.450 - 4.500 uIU/mL  UA/M w/rflx Culture, Routine   Specimen: Blood   BLD  Result Value Ref Range   Specific Gravity, UA 1.015 1.005 - 1.030   pH, UA 5.0 5.0 - 7.5   Color, UA Yellow Yellow   Appearance Ur Clear Clear   Leukocytes,UA Negative Negative   Protein,UA 3+ (A) Negative/Trace   Glucose, UA 3+ (A) Negative   Ketones, UA Negative Negative   RBC, UA 2+ (A) Negative   Bilirubin, UA Negative Negative   Urobilinogen, Ur 0.2 0.2 - 1.0 mg/dL   Nitrite, UA Negative Negative   Microscopic  Examination See below:   Microalbumin, Urine Waived  Result Value Ref Range   Microalb, Ur Waived 150 (H) 0 - 19 mg/L   Creatinine, Urine Waived 50 10 - 300 mg/dL   Microalb/Creat Ratio >300 (H) <30 mg/g      Assessment & Plan:   Problem List Items Addressed This Visit      Endocrine   Type 2 diabetes mellitus with hyperosmolar nonketotic hyperglycemia ([place]) - Primary    Not under good control- needs to get back in with endocrinology but having trouble affording copays. Will get him hooked into CCM to see if they can help. Referral generated today. We will increase his levemir to 35 units daily. Continue to monitor. Call with any concerns.       Relevant Medications   LEVEMIR FLEXTOUCH 100 UNIT/ML Pen   Other Relevant Orders   CBC with Differential/Platelet (Completed)   Bayer DCA Hb A1c Waived (Completed)   Comprehensive metabolic panel (Completed)   TSH (Completed)   UA/M w/rflx Culture, Routine (Completed)   Microalbumin, Urine Waived (Completed)   Referral to Chronic Care Management Services     Genitourinary   Benign hypertensive renal disease    Under good control on current regimen. Continue current regimen. Continue to monitor. Call with any concerns. Refills given. Labs drawn today.       Relevant Orders   CBC with Differential/Platelet (Completed)   Comprehensive metabolic panel (Completed)   TSH (Completed)   UA/M w/rflx Culture, Routine (Completed)   Microalbumin, Urine Waived (Completed)   AKI (acute kidney injury) ([place])    Rechecking levels today. Await results.       Relevant Orders   CBC with Differential/Platelet (Completed)   Comprehensive metabolic panel (Completed)   TSH (Completed)   UA/M w/rflx Culture, Routine (Completed)   Microalbumin, Urine Waived (Completed)     Other   Hyperlipidemia    Under good control on current regimen. Continue current regimen. Continue to monitor. Call with any concerns. Refills given. Labs drawn today.        Relevant Orders   CBC with Differential/Platelet (Completed)   Comprehensive metabolic panel (Completed)   Lipid Panel w/o Chol/HDL Ratio (Completed)   TSH (Completed)   UA/M w/rflx Culture, Routine (Completed)       Follow up plan: Return in about 1 week (around 01/23/2019) for check in on sugars (virtual OK).

## 2019-01-17 LAB — COMPREHENSIVE METABOLIC PANEL
ALT: 15 IU/L (ref 0–44)
AST: 20 IU/L (ref 0–40)
Albumin/Globulin Ratio: 1.6 (ref 1.2–2.2)
Albumin: 4.2 g/dL (ref 4.0–5.0)
Alkaline Phosphatase: 139 IU/L — ABNORMAL HIGH (ref 39–117)
BUN/Creatinine Ratio: 16 (ref 9–20)
BUN: 25 mg/dL — ABNORMAL HIGH (ref 6–24)
Bilirubin Total: 0.3 mg/dL (ref 0.0–1.2)
CO2: 20 mmol/L (ref 20–29)
Calcium: 9.1 mg/dL (ref 8.7–10.2)
Chloride: 94 mmol/L — ABNORMAL LOW (ref 96–106)
Creatinine, Ser: 1.59 mg/dL — ABNORMAL HIGH (ref 0.76–1.27)
GFR calc Af Amer: 59 mL/min/{1.73_m2} — ABNORMAL LOW (ref >59)
GFR calc non Af Amer: 51 mL/min/{1.73_m2} — ABNORMAL LOW (ref >59)
Globulin, Total: 2.7 g/dL (ref 1.5–4.5)
Glucose: 415 mg/dL — ABNORMAL HIGH (ref 65–99)
Potassium: 4.1 mmol/L (ref 3.5–5.2)
Sodium: 130 mmol/L — ABNORMAL LOW (ref 134–144)
Total Protein: 6.9 g/dL (ref 6.0–8.5)

## 2019-01-17 LAB — CBC WITH DIFFERENTIAL/PLATELET
Basophils Absolute: 0.1 10*3/uL (ref 0.0–0.2)
Basos: 1 %
EOS (ABSOLUTE): 0.3 10*3/uL (ref 0.0–0.4)
Eos: 6 %
Hematocrit: 32.1 % — ABNORMAL LOW (ref 37.5–51.0)
Hemoglobin: 10.2 g/dL — ABNORMAL LOW (ref 13.0–17.7)
Immature Grans (Abs): 0 10*3/uL (ref 0.0–0.1)
Immature Granulocytes: 0 %
Lymphocytes Absolute: 2.6 10*3/uL (ref 0.7–3.1)
Lymphs: 45 %
MCH: 28.1 pg (ref 26.6–33.0)
MCHC: 31.8 g/dL (ref 31.5–35.7)
MCV: 88 fL (ref 79–97)
Monocytes Absolute: 0.4 10*3/uL (ref 0.1–0.9)
Monocytes: 8 %
Neutrophils Absolute: 2.3 10*3/uL (ref 1.4–7.0)
Neutrophils: 40 %
Platelets: 270 10*3/uL (ref 150–450)
RBC: 3.63 x10E6/uL — ABNORMAL LOW (ref 4.14–5.80)
RDW: 13.1 % (ref 11.6–15.4)
WBC: 5.6 10*3/uL (ref 3.4–10.8)

## 2019-01-17 LAB — LIPID PANEL W/O CHOL/HDL RATIO
Cholesterol, Total: 159 mg/dL (ref 100–199)
HDL: 58 mg/dL (ref >39)
LDL Chol Calc (NIH): 67 mg/dL (ref 0–99)
Triglycerides: 208 mg/dL — ABNORMAL HIGH (ref 0–149)
VLDL Cholesterol Cal: 34 mg/dL (ref 5–40)

## 2019-01-17 LAB — TSH: TSH: 0.48 u[IU]/mL (ref 0.450–4.500)

## 2019-01-18 ENCOUNTER — Encounter: Payer: Self-pay | Admitting: Family Medicine

## 2019-01-18 NOTE — Assessment & Plan Note (Signed)
Rechecking levels today. Await results.  

## 2019-01-18 NOTE — Assessment & Plan Note (Addendum)
Not under good control- needs to get back in with endocrinology but having trouble affording copays. Will get him hooked into CCM to see if they can help. Referral generated today. We will increase his levemir to 35 units daily. Continue to monitor. Call with any concerns.

## 2019-01-18 NOTE — Assessment & Plan Note (Signed)
Under good control on current regimen. Continue current regimen. Continue to monitor. Call with any concerns. Refills given. Labs drawn today.   

## 2019-01-20 ENCOUNTER — Ambulatory Visit: Payer: 59 | Admitting: Family

## 2019-01-23 ENCOUNTER — Encounter: Payer: Self-pay | Admitting: Family Medicine

## 2019-01-23 ENCOUNTER — Other Ambulatory Visit: Payer: Self-pay

## 2019-01-23 ENCOUNTER — Ambulatory Visit (INDEPENDENT_AMBULATORY_CARE_PROVIDER_SITE_OTHER): Payer: Managed Care, Other (non HMO) | Admitting: Family Medicine

## 2019-01-23 VITALS — Ht 72.0 in | Wt 160.0 lb

## 2019-01-23 DIAGNOSIS — Z794 Long term (current) use of insulin: Secondary | ICD-10-CM

## 2019-01-23 DIAGNOSIS — E1365 Other specified diabetes mellitus with hyperglycemia: Secondary | ICD-10-CM | POA: Diagnosis not present

## 2019-01-23 MED ORDER — LEVEMIR FLEXTOUCH 100 UNIT/ML ~~LOC~~ SOPN: 40.0000 [IU] | PEN_INJECTOR | Freq: Every day | SUBCUTANEOUS | 1 refills | Status: DC

## 2019-01-23 NOTE — Assessment & Plan Note (Signed)
Still running in the 240s fasting- will increase his levemir to 40 units daily and recheck 1 month. Call with any concerns. Continue to monitor. Call with any concerns. Recheck 1 week.

## 2019-01-23 NOTE — Progress Notes (Signed)
Ht 6' (1.829 m)   Wt 160 lb (72.6 kg)   BMI 21.70 kg/m    Subjective:    Patient ID: Jesse Christian, male    DOB: [birth year], 48 y.o.   MRN: [id number]  HPI: Jesse Christian is a 48 y.o. male  Chief Complaint  Patient presents with  . Diabetes   We increased his levemir to 35units last week. Since then, he as been running in the 240 DIABETES Hypoglycemic episodes:no Polydipsia/polyuria: no Visual disturbance: no Chest pain: no Paresthesias: no Glucose Monitoring: yes  Accucheck frequency: Daily  Fasting glucose: 240 Taking Insulin?: yes  Long acting insulin: levemir  Short acting insulin: novolog Blood Pressure Monitoring: not checking Retinal Examination: Not up to Date Foot Exam: Up to Date Diabetic Education: Completed Pneumovax: Up to Date Influenza: Up to Date Aspirin: no   Relevant past medical, surgical, family and social history reviewed and updated as indicated. Interim medical history since our last visit reviewed. Allergies and medications reviewed and updated.  Review of Systems  Constitutional: Negative.   Respiratory: Negative.   Cardiovascular: Negative.   Neurological: Negative.   Psychiatric/Behavioral: Negative.     Per HPI unless specifically indicated above     Objective:    Ht 6' (1.829 m)   Wt 160 lb (72.6 kg)   BMI 21.70 kg/m   Wt Readings from Last 3 Encounters:  01/23/19 160 lb (72.6 kg)  01/16/19 163 lb (73.9 kg)  01/07/19 160 lb 7.9 oz (72.8 kg)    Physical Exam Vitals signs and nursing note reviewed.  Pulmonary:     Effort: Pulmonary effort is normal. No respiratory distress.     Comments: Speaking in full sentences Neurological:     Mental Status: He is alert.  Psychiatric:        Mood and Affect: Mood normal.        Behavior: Behavior normal.        Thought Content: Thought content normal.        Judgment: Judgment normal.     Results for orders placed or performed in visit on 01/16/19  Microscopic  Examination   BLD  Result Value Ref Range   WBC, UA None seen 0 - 5 /hpf   RBC 3-10 (A) 0 - 2 /hpf   Epithelial Cells (non renal) 0-10 0 - 10 /hpf   Bacteria, UA None seen None seen/Few  CBC with Differential/Platelet  Result Value Ref Range   WBC 5.6 3.4 - 10.8 x10E3/uL   RBC 3.63 (L) 4.14 - 5.80 x10E6/uL   Hemoglobin 10.2 (L) 13.0 - 17.7 g/dL   Hematocrit 32.1 (L) 37.5 - 51.0 %   MCV 88 79 - 97 fL   MCH 28.1 26.6 - 33.0 pg   MCHC 31.8 31.5 - 35.7 g/dL   RDW 13.1 11.6 - 15.4 %   Platelets 270 150 - 450 x10E3/uL   Neutrophils 40 Not Estab. %   Lymphs 45 Not Estab. %   Monocytes 8 Not Estab. %   Eos 6 Not Estab. %   Basos 1 Not Estab. %   Neutrophils Absolute 2.3 1.4 - 7.0 x10E3/uL   Lymphocytes Absolute 2.6 0.7 - 3.1 x10E3/uL   Monocytes Absolute 0.4 0.1 - 0.9 x10E3/uL   EOS (ABSOLUTE) 0.3 0.0 - 0.4 x10E3/uL   Basophils Absolute 0.1 0.0 - 0.2 x10E3/uL   Immature Granulocytes 0 Not Estab. %   Immature Grans (Abs) 0.0 0.0 - 0.1 x10E3/uL  Bayer DCA Hb  A1c Waived  Result Value Ref Range   HB A1C (BAYER DCA - WAIVED) 13.7 (H) <7.0 %  Comprehensive metabolic panel  Result Value Ref Range   Glucose 415 (H) 65 - 99 mg/dL   BUN 25 (H) 6 - 24 mg/dL   Creatinine, Ser 1.611.59 (H) 0.76 - 1.27 mg/dL   GFR calc non Af Amer 51 (L) >59 mL/min/1.73   GFR calc Af Amer 59 (L) >59 mL/min/1.73   BUN/Creatinine Ratio 16 9 - 20   Sodium 130 (L) 134 - 144 mmol/L   Potassium 4.1 3.5 - 5.2 mmol/L   Chloride 94 (L) 96 - 106 mmol/L   CO2 20 20 - 29 mmol/L   Calcium 9.1 8.7 - 10.2 mg/dL   Total Protein 6.9 6.0 - 8.5 g/dL   Albumin 4.2 4.0 - 5.0 g/dL   Globulin, Total 2.7 1.5 - 4.5 g/dL   Albumin/Globulin Ratio 1.6 1.2 - 2.2   Bilirubin Total 0.3 0.0 - 1.2 mg/dL   Alkaline Phosphatase 139 (H) 39 - 117 IU/L   AST 20 0 - 40 IU/L   ALT 15 0 - 44 IU/L  Lipid Panel w/o Chol/HDL Ratio  Result Value Ref Range   Cholesterol, Total 159 100 - 199 mg/dL   Triglycerides 096208 (H) 0 - 149 mg/dL   HDL 58 >04>39  mg/dL   VLDL Cholesterol Cal 34 5 - 40 mg/dL   LDL Chol Calc (NIH) 67 0 - 99 mg/dL  TSH  Result Value Ref Range   TSH 0.480 0.450 - 4.500 uIU/mL  UA/M w/rflx Culture, Routine   Specimen: Blood   BLD  Result Value Ref Range   Specific Gravity, UA 1.015 1.005 - 1.030   pH, UA 5.0 5.0 - 7.5   Color, UA Yellow Yellow   Appearance Ur Clear Clear   Leukocytes,UA Negative Negative   Protein,UA 3+ (A) Negative/Trace   Glucose, UA 3+ (A) Negative   Ketones, UA Negative Negative   RBC, UA 2+ (A) Negative   Bilirubin, UA Negative Negative   Urobilinogen, Ur 0.2 0.2 - 1.0 mg/dL   Nitrite, UA Negative Negative   Microscopic Examination See below:   Microalbumin, Urine Waived  Result Value Ref Range   Microalb, Ur Waived 150 (H) 0 - 19 mg/L   Creatinine, Urine Waived 50 10 - 300 mg/dL   Microalb/Creat Ratio >300 (H) <30 mg/g      Assessment & Plan:   Problem List Items Addressed This Visit      Endocrine   Diabetes mellitus with hyperglycemia, with long-term current use of insulin (HCC) - Primary    Still running in the 240s fasting- will increase his levemir to 40 units daily and recheck 1 month. Call with any concerns. Continue to monitor. Call with any concerns. Recheck 1 week.       Relevant Medications   LEVEMIR FLEXTOUCH 100 UNIT/ML Pen       Follow up plan: Return in about 1 week (around 01/30/2019) for follow up DM.    Marland Kitchen. This visit was completed via telephone due to the restrictions of the COVID-19 pandemic. All issues as above were discussed and addressed but no physical exam was performed. If it was felt that the patient should be evaluated in the office, they were directed there. The patient verbally consented to this visit. Patient was unable to complete an audio/visual visit due to Lack of equipment. Due to the catastrophic nature of the COVID-19 pandemic, this visit was done  through audio contact only. . Location of the patient: work . Location of the provider: work  . Those involved with this call:  . Provider: Olevia Perches, DO . CMA: Elton Sin, CMA . Front Desk/Registration: Adela Ports  . Time spent on call: 21 minutes on the phone discussing health concerns. 23 minutes total spent in review of patient's record and preparation of their chart.

## 2019-02-11 ENCOUNTER — Telehealth: Payer: 59

## 2019-02-11 ENCOUNTER — Ambulatory Visit: Payer: Managed Care, Other (non HMO) | Admitting: Pharmacist

## 2019-02-11 DIAGNOSIS — E1365 Other specified diabetes mellitus with hyperglycemia: Secondary | ICD-10-CM

## 2019-02-11 DIAGNOSIS — Z794 Long term (current) use of insulin: Secondary | ICD-10-CM

## 2019-02-11 DIAGNOSIS — I5043 Acute on chronic combined systolic (congestive) and diastolic (congestive) heart failure: Secondary | ICD-10-CM

## 2019-02-11 NOTE — Patient Instructions (Signed)
Visit Information  Goals Addressed            This Visit's Progress     Patient Stated   . PharmD "I have a lot going on right now" (pt-stated)       Current Barriers:  . Polypharmacy; complex patient with multiple comorbidities including T2DM (though possibly more T1.5, low c-peptide and GADA positive); CHF, substance abuse, currently receiving methadone from methadone clinic o Diabetes: followed with Dr. Gabriel Carina at [facility], has not followed up with her since 08/2018; since then, has had 3 admissions related to hyperglycemia. Notes that he has a difficult time affording her copay, and a difficult time affording FreeStyle Libre sensor.  - Levemir 40 units daily, Novolog sliding scale, base 6 units up to 15 units TID with meals; denies cost concerns with either insulin; Copay ~$25/month, copay cards would not provide any additional benefit on top of this - Notes he does not like checking w/ finger stick glucose checks; would prefer to use CGM, however, not covered by [employer], and he cannot afford the $75/month for sensors o CHF, ICD placement; s/p CABG x 2 in 05/2018; follows w/ [facility] cardiology. Had 1 appointment with cardiac rehab, but did not continue to attend. Did not follow up with HF clinic appointment.  - Furosemide 40 mg daily, losartan 25 mg daily, metoprolol succinate 25 mg daily  - Atorvastatin 80 mg daily, ASA 81 mg dialy  o GI Concerns: possible component of diabetic gastroparesis; recently established w/ [facility] and had appointment today - Metoclopramide continued; gastroparesis-friendly diet information provided and encouraged; noted they will consider endoscopy or other invasive testing in the future pending COVID risks  Pharmacist Clinical Goal(s):  Marland Kitchen Over the next 90 days, patient will work with PharmD and provider towards optimized medication management  Interventions: . Comprehensive medication review performed; medication list updated in electronic medical  record . Discussed the option for copay cards. Tyler Aas + copay card would only be $5, however, it appears per chart review that patient's current plan does not cover [place] or [place] when Dr. Joycie Peek office tried these options . Unsure if DexCom may be a more preferred CGM w/ [employer]. Discussed with patient that first step is to get him back in w/ Dr. Gabriel Carina, especially given his brittle T1/T1.5 DM picture and multiple recent admissions. Wonder if patient would be a candidate for insulin pump therapy. Contacted Dr. Joycie Peek office; explained patient's cost concerns and asked that they outreach him to schedule follow up.  . Notes multiple financial concerns, wonders about applying for disability, but is unsure where to start with that. Will collaborate w/ Eula Fried, LCSW for this  Patient Self Care Activities:  . Patient will take medications as prescribed  Initial goal documentation        The patient verbalized understanding of instructions provided today and declined a print copy of patient instruction materials.    Plan: - Will collaborate w/ CCM team to support this patient w/ medication management needs - Will outreach in the next 5-6 weeks for continued medication management support.  Catie [name], PharmD Clinical Pharmacist [place] [phone]

## 2019-02-11 NOTE — Chronic Care Management (AMB) (Signed)
Chronic Care Management   Note  02/11/2019 Name: Jesse Christian MRN: [id number] DOB: [birth year]   Subjective:  Jesse Christian is a 48 y.o. year old male who is a primary care patient of Valerie Roys, DO. The CCM team was consulted for assistance with chronic disease management and care coordination needs.    Contacted patient telephonically to review medication management needs.   Review of patient status, including review of consultants reports, laboratory and other test data, was performed as part of comprehensive evaluation and provision of chronic care management services.   Objective:  Lab Results  Component Value Date   CREATININE 1.59 (H) 01/16/2019   CREATININE 1.27 (H) 01/09/2019   CREATININE 1.11 01/08/2019    Lab Results  Component Value Date   HGBA1C 13.7 (H) 01/16/2019       Component Value Date/Time   CHOL 159 01/16/2019 1447   CHOL 170 02/04/2016 0905   TRIG 208 (H) 01/16/2019 1447   TRIG 311 (H) 02/04/2016 0905   HDL 58 01/16/2019 1447   CHOLHDL 2.5 08/21/2018 0727   VLDL 26 08/21/2018 0727   VLDL 62 (H) 02/04/2016 0905   LDLCALC 67 01/16/2019 1447    Clinical ASCVD: Yes  The ASCVD Risk score [name], et al., 2013) failed to calculate for the following reasons:   The patient has a prior MI or stroke diagnosis    BP Readings from Last 3 Encounters:  01/16/19 (!) 142/87  01/08/19 119/72  10/16/18 136/78    Allergies  Allergen Reactions  . Lyrica [Pregabalin] Other (See Comments)    Peripheral edema  . Metformin And Related Nausea And Vomiting    Medications Reviewed Today    Reviewed by De Hollingshead, [facility] (Pharmacist) on 02/11/19 at 1438  Med List Status: <None>  Medication Order Taking? Sig Documenting Provider Last Dose Status Informant  aspirin 81 MG chewable tablet [id number]  Chew 81 mg by mouth daily.  [provider]  Active Self  atorvastatin (LIPITOR) 80 MG tablet [id number]  Take 80 mg by mouth daily.  [provider]  Active Self  furosemide (LASIX) 40 MG tablet [id number]  Take 1 tablet (40 mg total) by mouth daily. Bettey Costa, MD  Active Self  insulin aspart (NOVOLOG) 100 UNIT/ML injection [id number] Yes Inject 6 Units into the skin 3 (three) times daily before meals. [provider] Taking Active Self           Med Note Jesse Christian, Jesse Christian   Tue Feb 11, 2019  2:34 PM) Sliding scale 6-15 units TID (baseline 6 units)  LEVEMIR FLEXTOUCH 100 UNIT/ML Pen [id number] Yes Inject 40 Units into the skin at bedtime. [name], [name], DO Taking Active   losartan (COZAAR) 25 MG tablet [id number]  Take 25 mg by mouth daily. [provider]  Active Self  magnesium oxide (MAG-OX) 400 MG tablet [id number]  Take 400 mg by mouth daily. [provider]  Active Self  methadone (DOLOPHINE) 10 MG tablet [id number]  Take 130 mg by mouth daily.  [provider]  Active Self  metoCLOPramide (REGLAN) 10 MG tablet [id number]  Take 5 mg by mouth every 6 (six) hours as needed for nausea/vomiting. [provider]  Active Self  metoprolol succinate (TOPROL-XL) 25 MG 24 hr tablet [id number]  Take 25 mg by mouth daily. [provider]  Active Self  sucralfate (CARAFATE) 1 GM/10ML suspension [id number]  Take 10 mLs (1 g total) by mouth 4 (four) times  daily -  with meals and at bedtime. Dorcas Carrow, DO  Active Self           Med Note Trixie Rude Jan 16, 2019  2:35 PM) As needed  Med List Note [name], CPhT 05/06/18 0004): Patient receives Methadone from [name]           Assessment:   Goals Addressed            This Visit's Progress     Patient Stated   . PharmD "I have a lot going on right now" (pt-stated)       Current Barriers:  . Polypharmacy; complex patient with multiple comorbidities including T2DM (though possibly more T1.5, low c-peptide and GADA positive); CHF, substance abuse, currently receiving methadone from methadone  clinic o Diabetes: followed with Dr. Tedd Sias at [facility]ocr[facility], has not followed up with her since 08/2018; since then, has had 3 admissions related to hyperglycemia. Notes that he has a difficult time affording her copay, and a difficult time affording FreeStyle Libre sensor.  - Levemir 40 units daily, Novolog sliding scale, base 6 units up to 15 units TID with meals; denies cost concerns with either insulin; Copay ~$25/month, copay cards would not provide any additional benefit on top of this - Notes he does not like checking w/ finger stick glucose checks; would prefer to use CGM, however, not covered by [employer], and he cannot afford the $75/month for sensors o CHF, ICD placement; s/p CABG x 2 in 05/2018; follows w/ [facility] cardiology. Had 1 appointment with cardiac rehab, but did not continue to attend. Did not follow up with HF clinic appointment.  - Furosemide 40 mg daily, losartan 25 mg daily, metoprolol succinate 25 mg daily  - Atorvastatin 80 mg daily, ASA 81 mg dialy  o GI Concerns: possible component of diabetic gastroparesis; recently established w/ [facility] and had appointment today - Metoclopramide continued; gastroparesis-friendly diet information provided and encouraged; noted they will consider endoscopy or other invasive testing in the future pending COVID risks  Pharmacist Clinical Goal(s):  Marland Kitchen Over the next 90 days, patient will work with PharmD and provider towards optimized medication management  Interventions: . Comprehensive medication review performed; medication list updated in electronic medical record . Discussed the option for copay cards. [name] + copay card would only be $5, however, it appears per chart review that patient's current plan does not cover [place] or [place]je[place] when Dr. Pricilla Handler office tried these options . Unsure if DexCom may be a more preferred CGM w/ [employer]. Discussed with patient that first step is to get him back in w/ Dr. Tedd Sias, especially given his  brittle T1/T1.5 DM picture and multiple recent admissions. Wonder if patient would be a candidate for insulin pump therapy. Contacted Dr. Pricilla Handler office; explained patient's cost concerns and asked that they outreach him to schedule follow up.  . Notes multiple financial concerns, wonders about applying for disability, but is unsure where to start with that. Will collaborate w/ Dickie La, LCSW for this  Patient Self Care Activities:  . Patient will take medications as prescribed  Initial goal documentation        Plan: - Will collaborate w/ CCM team to support this patient w/ medication management needs - Will outreach in the next 5-6 weeks for continued medication management support.  Catie Feliz Beam, PharmD Clinical Pharmacist [facility] [phone]

## 2019-03-11 ENCOUNTER — Ambulatory Visit: Payer: Managed Care, Other (non HMO) | Admitting: Pharmacist

## 2019-03-11 DIAGNOSIS — Z794 Long term (current) use of insulin: Secondary | ICD-10-CM

## 2019-03-11 DIAGNOSIS — I5043 Acute on chronic combined systolic (congestive) and diastolic (congestive) heart failure: Secondary | ICD-10-CM

## 2019-03-11 DIAGNOSIS — E1365 Other specified diabetes mellitus with hyperglycemia: Secondary | ICD-10-CM

## 2019-03-11 DIAGNOSIS — E1100 Type 2 diabetes mellitus with hyperosmolarity without nonketotic hyperglycemic-hyperosmolar coma (NKHHC): Secondary | ICD-10-CM

## 2019-03-11 NOTE — Addendum Note (Signed)
Addended by: De Hollingshead on: 03/11/2019 03:51 PM   Modules accepted: Orders

## 2019-03-11 NOTE — Chronic Care Management (AMB) (Addendum)
Chronic Care Management   Follow Up Note   03/11/2019 Name: Jesse Christian MRN: [id number] DOB: Sep 05, 1970  Referred by: Dorcas Carrow, DO Reason for referral : Chronic Care Management (Medication Management)   Jesse Christian is a 48 y.o. year old male who is a primary care patient of Dorcas Carrow, DO. The CCM team was consulted for assistance with chronic disease management and care coordination needs.   Contacted patient for medication management review. He was at work and was unable to talk for long, but his biggest concern is his desire to apply for disability.    Review of patient status, including review of consultants reports, relevant laboratory and other test results, and collaboration with appropriate care team members and the patient's provider was performed as part of comprehensive patient evaluation and provision of chronic care management services.    SDOH (Social Determinants of Health) screening performed today: Financial Strain . See Care Plan for related entries.   Outpatient Encounter Medications as of 03/11/2019  Medication Sig Note  . aspirin 81 MG chewable tablet Chew 81 mg by mouth daily.    Marland Kitchen atorvastatin (LIPITOR) 80 MG tablet Take 80 mg by mouth daily.   . furosemide (LASIX) 40 MG tablet Take 1 tablet (40 mg total) by mouth daily. 03/11/2019: Using PRN   . LEVEMIR FLEXTOUCH 100 UNIT/ML Pen Inject 40 Units into the skin at bedtime.   Marland Kitchen losartan (COZAAR) 25 MG tablet Take 25 mg by mouth daily.   . methadone (DOLOPHINE) 10 MG tablet Take 130 mg by mouth daily.    . metoprolol succinate (TOPROL-XL) 25 MG 24 hr tablet Take 25 mg by mouth daily.   . sucralfate (CARAFATE) 1 GM/10ML suspension Take 10 mLs (1 g total) by mouth 4 (four) times daily -  with meals and at bedtime. 01/16/2019: As needed  . insulin aspart (NOVOLOG) 100 UNIT/ML injection Inject 6 Units into the skin 3 (three) times daily before meals. 02/11/2019: Sliding scale 6-15 units TID (baseline 6  units)  . magnesium oxide (MAG-OX) 400 MG tablet Take 400 mg by mouth daily.   . metoCLOPramide (REGLAN) 10 MG tablet Take 5 mg by mouth every 6 (six) hours as needed for nausea/vomiting.    No facility-administered encounter medications on file as of 03/11/2019.      Goals Addressed            This Visit's Progress     Patient Stated   . PharmD "I have a lot going on right now" (pt-stated)       Current Barriers:  . Polypharmacy; complex patient with multiple comorbidities including T1.5DM/LADA; CHF, substance abuse, currently receiving methadone from methadone clinic o Diabetes: follows with Dr. Tedd Sias at [facility], last seen 11/13; Last A1c 13.1%. Levemir 40 units daily, Novolog sliding scale; Farxiga 5 mg daily added. Patient denies GU complaints after starting Farxiga. Previously used [employer], but notes concerns w/ the cost so has been using traditional glucometer o CHF, ICD placement; s/p CABG x 2 in 05/2018; follows w/ [facility] cardiology; last appt 03/04/2019; Furosemide 40 mg daily PRN , losartan 25 mg daily, metoprolol succinate 25 mg daily o ASCVD risk reduction: Atorvastatin 80 mg daily, ASA 81 mg daily; last LDL at goal <70 o GI Concerns: possible component of diabetic gastroparesis; recently established w/ [facility]; Metoclopramide 5 mg QPM up to BID.   Pharmacist Clinical Goal(s):  Marland Kitchen Over the next 90 days, patient will work with PharmD and  provider towards optimized medication management  Interventions: . Comprehensive medication review performed; medication list updated in electronic medical record . Messaged LCSW to coordinate patient being outreached for support w/ the disability process sooner than currently scheduled. Patient denies a preferred date/time for outreach. Will also place Care Guide referral. . Reviewed instructions from Dr. Gabriel Carina to bring glucometer to f/u, as well as his chart for sliding scale dosing. Patient verbalizes understanding.  . Provided  empathetic listening has he discussed how difficult it has been to continue to work w/ COVID screening, as many symptoms he struggles w/ regularly may be misconstrued as a symptom of COVID.   Patient Self Care Activities:  . Patient will take medications as prescribed  Please see past updates related to this goal by clicking on the "Past Updates" button in the selected goal          Plan: - Will collaborate w/ LCSW as above - Will outreach patient in ~5-6 weeks for continued medication management support  Catie [name], PharmD, [place] [phone]

## 2019-03-11 NOTE — Patient Instructions (Addendum)
Visit Information  Goals Addressed            This Visit's Progress     Patient Stated   . PharmD "I have a lot going on right now" (pt-stated)       Current Barriers:  . Polypharmacy; complex patient with multiple comorbidities including T1.5DM/LADA; CHF, substance abuse, currently receiving methadone from methadone clinic o Diabetes: follows with Dr. Gabriel Carina at [facility], last seen 11/13; Last A1c 13.1%. Levemir 40 units daily, Novolog sliding scale; Farxiga 5 mg daily added. Patient denies GU complaints after starting Farxiga. Previously used [employer], but notes concerns w/ the cost so has been using traditional glucometer o CHF, ICD placement; s/p CABG x 2 in 05/2018; follows w/ [facility] cardiology; last appt 03/04/2019; Furosemide 40 mg daily PRN , losartan 25 mg daily, metoprolol succinate 25 mg daily o ASCVD risk reduction: Atorvastatin 80 mg daily, ASA 81 mg daily; last LDL at goal <70 o GI Concerns: possible component of diabetic gastroparesis; recently established w/ [facility]; Metoclopramide 5 mg QPM up to BID.   Pharmacist Clinical Goal(s):  Marland Kitchen Over the next 90 days, patient will work with PharmD and provider towards optimized medication management  Interventions: . Comprehensive medication review performed; medication list updated in electronic medical record . Messaged LCSW to coordinate patient being outreached for support w/ the disability process sooner than currently scheduled. Patient denies a preferred date/time for outreach. Will also place Care Guide referral. . Reviewed instructions from Dr. Gabriel Carina to bring glucometer to f/u, as well as his chart for sliding scale dosing. Patient verbalizes understanding.  . Provided empathetic listening has he discussed how difficult it has been to continue to work w/ COVID screening, as many symptoms he struggles w/ regularly may be misconstrued as a symptom of COVID.   Patient Self Care Activities:  . Patient will take  medications as prescribed  Please see past updates related to this goal by clicking on the "Past Updates" button in the selected goal         The patient verbalized understanding of instructions provided today and declined a print copy of patient instruction materials.   Plan: - Will collaborate w/ LCSW as above - Will outreach patient in ~5-6 weeks for continued medication management support  Catie [name], PharmD, [place] [phone]

## 2019-03-19 ENCOUNTER — Encounter: Payer: Self-pay | Admitting: Emergency Medicine

## 2019-03-19 ENCOUNTER — Other Ambulatory Visit: Payer: Self-pay

## 2019-03-19 ENCOUNTER — Observation Stay
Admission: EM | Admit: 2019-03-19 | Discharge: 2019-03-20 | Disposition: A | Discharge status: Home / Self Care | Payer: 59 | Attending: Internal Medicine | Admitting: Internal Medicine

## 2019-03-19 ENCOUNTER — Emergency Department: Payer: 59

## 2019-03-19 DIAGNOSIS — I5022 Chronic systolic (congestive) heart failure: Secondary | ICD-10-CM | POA: Diagnosis not present

## 2019-03-19 DIAGNOSIS — Z7982 Long term (current) use of aspirin: Secondary | ICD-10-CM | POA: Insufficient documentation

## 2019-03-19 DIAGNOSIS — Z95810 Presence of automatic (implantable) cardiac defibrillator: Secondary | ICD-10-CM | POA: Insufficient documentation

## 2019-03-19 DIAGNOSIS — Z79899 Other long term (current) drug therapy: Secondary | ICD-10-CM | POA: Diagnosis not present

## 2019-03-19 DIAGNOSIS — E1301 Other specified diabetes mellitus with hyperosmolarity with coma: Secondary | ICD-10-CM | POA: Diagnosis present

## 2019-03-19 DIAGNOSIS — G894 Chronic pain syndrome: Secondary | ICD-10-CM | POA: Diagnosis not present

## 2019-03-19 DIAGNOSIS — Z794 Long term (current) use of insulin: Secondary | ICD-10-CM | POA: Insufficient documentation

## 2019-03-19 DIAGNOSIS — E871 Hypo-osmolality and hyponatremia: Secondary | ICD-10-CM | POA: Diagnosis not present

## 2019-03-19 DIAGNOSIS — R42 Dizziness and giddiness: Secondary | ICD-10-CM | POA: Diagnosis present

## 2019-03-19 DIAGNOSIS — E1101 Type 2 diabetes mellitus with hyperosmolarity with coma: Secondary | ICD-10-CM | POA: Insufficient documentation

## 2019-03-19 DIAGNOSIS — E1165 Type 2 diabetes mellitus with hyperglycemia: Secondary | ICD-10-CM | POA: Diagnosis not present

## 2019-03-19 DIAGNOSIS — N179 Acute kidney failure, unspecified: Secondary | ICD-10-CM | POA: Diagnosis not present

## 2019-03-19 DIAGNOSIS — I110 Hypertensive heart disease with heart failure: Secondary | ICD-10-CM | POA: Diagnosis not present

## 2019-03-19 DIAGNOSIS — Z87891 Personal history of nicotine dependence: Secondary | ICD-10-CM | POA: Insufficient documentation

## 2019-03-19 DIAGNOSIS — Z79891 Long term (current) use of opiate analgesic: Secondary | ICD-10-CM | POA: Insufficient documentation

## 2019-03-19 DIAGNOSIS — I10 Essential (primary) hypertension: Secondary | ICD-10-CM

## 2019-03-19 DIAGNOSIS — E875 Hyperkalemia: Secondary | ICD-10-CM | POA: Insufficient documentation

## 2019-03-19 DIAGNOSIS — E1100 Type 2 diabetes mellitus with hyperosmolarity without nonketotic hyperglycemic-hyperosmolar coma (NKHHC): Secondary | ICD-10-CM | POA: Diagnosis not present

## 2019-03-19 DIAGNOSIS — Z20828 Contact with and (suspected) exposure to other viral communicable diseases: Secondary | ICD-10-CM | POA: Insufficient documentation

## 2019-03-19 LAB — CBC WITH DIFFERENTIAL/PLATELET
Abs Immature Granulocytes: 0.05 10*3/uL (ref 0.00–0.07)
Basophils Absolute: 0.1 10*3/uL (ref 0.0–0.1)
Basophils Relative: 1 %
Eosinophils Absolute: 0.2 10*3/uL (ref 0.0–0.5)
Eosinophils Relative: 2 %
HCT: 37.3 % — ABNORMAL LOW (ref 39.0–52.0)
Hemoglobin: 12.3 g/dL — ABNORMAL LOW (ref 13.0–17.0)
Immature Granulocytes: 1 %
Lymphocytes Relative: 27 %
Lymphs Abs: 1.9 10*3/uL (ref 0.7–4.0)
MCH: 27.5 pg (ref 26.0–34.0)
MCHC: 33 g/dL (ref 30.0–36.0)
MCV: 83.3 fL (ref 80.0–100.0)
Monocytes Absolute: 0.5 10*3/uL (ref 0.1–1.0)
Monocytes Relative: 7 %
Neutro Abs: 4.3 10*3/uL (ref 1.7–7.7)
Neutrophils Relative %: 62 %
Platelets: 283 10*3/uL (ref 150–400)
RBC: 4.48 MIL/uL (ref 4.22–5.81)
RDW: 12.3 % (ref 11.5–15.5)
WBC: 6.9 10*3/uL (ref 4.0–10.5)
nRBC: 0 % (ref 0.0–0.2)

## 2019-03-19 LAB — BASIC METABOLIC PANEL
Anion gap: 11 (ref 5–15)
BUN: 28 mg/dL — ABNORMAL HIGH (ref 6–20)
CO2: 28 mmol/L (ref 22–32)
Calcium: 8.9 mg/dL (ref 8.9–10.3)
Chloride: 98 mmol/L (ref 98–111)
Creatinine, Ser: 1.41 mg/dL — ABNORMAL HIGH (ref 0.61–1.24)
GFR calc Af Amer: >60 mL/min (ref >60)
GFR calc non Af Amer: 59 mL/min — ABNORMAL LOW (ref >60)
Glucose, Bld: 201 mg/dL — ABNORMAL HIGH (ref 70–99)
Potassium: 3.4 mmol/L — ABNORMAL LOW (ref 3.5–5.1)
Sodium: 137 mmol/L (ref 135–145)

## 2019-03-19 LAB — COMPREHENSIVE METABOLIC PANEL
ALT: 20 U/L (ref 0–44)
ALT: 18 U/L (ref 0–44)
AST: 18 U/L (ref 15–41)
AST: 18 U/L (ref 15–41)
Albumin: 4.3 g/dL (ref 3.5–5.0)
Albumin: 3.8 g/dL (ref 3.5–5.0)
Alkaline Phosphatase: 164 U/L — ABNORMAL HIGH (ref 38–126)
Alkaline Phosphatase: 127 U/L — ABNORMAL HIGH (ref 38–126)
Anion gap: 14 (ref 5–15)
Anion gap: 10 (ref 5–15)
BUN: 36 mg/dL — ABNORMAL HIGH (ref 6–20)
BUN: 32 mg/dL — ABNORMAL HIGH (ref 6–20)
CO2: 25 mmol/L (ref 22–32)
CO2: 28 mmol/L (ref 22–32)
Calcium: 9.2 mg/dL (ref 8.9–10.3)
Calcium: 9.3 mg/dL (ref 8.9–10.3)
Chloride: 82 mmol/L — ABNORMAL LOW (ref 98–111)
Chloride: 95 mmol/L — ABNORMAL LOW (ref 98–111)
Creatinine, Ser: 1.76 mg/dL — ABNORMAL HIGH (ref 0.61–1.24)
Creatinine, Ser: 1.48 mg/dL — ABNORMAL HIGH (ref 0.61–1.24)
GFR calc Af Amer: 53 mL/min — ABNORMAL LOW (ref >60)
GFR calc Af Amer: >60 mL/min (ref >60)
GFR calc non Af Amer: 45 mL/min — ABNORMAL LOW (ref >60)
GFR calc non Af Amer: 56 mL/min — ABNORMAL LOW (ref >60)
Glucose, Bld: 966 mg/dL (ref 70–99)
Glucose, Bld: 379 mg/dL — ABNORMAL HIGH (ref 70–99)
Potassium: 6.1 mmol/L — ABNORMAL HIGH (ref 3.5–5.1)
Potassium: 3.4 mmol/L — ABNORMAL LOW (ref 3.5–5.1)
Sodium: 121 mmol/L — ABNORMAL LOW (ref 135–145)
Sodium: 133 mmol/L — ABNORMAL LOW (ref 135–145)
Total Bilirubin: 0.7 mg/dL (ref 0.3–1.2)
Total Bilirubin: 0.7 mg/dL (ref 0.3–1.2)
Total Protein: 8.1 g/dL (ref 6.5–8.1)
Total Protein: 7.5 g/dL (ref 6.5–8.1)

## 2019-03-19 LAB — GLUCOSE, CAPILLARY
Glucose-Capillary: >600 mg/dL (ref 70–99)
Glucose-Capillary: 529 mg/dL (ref 70–99)
Glucose-Capillary: 361 mg/dL — ABNORMAL HIGH (ref 70–99)
Glucose-Capillary: 441 mg/dL — ABNORMAL HIGH (ref 70–99)
Glucose-Capillary: 230 mg/dL — ABNORMAL HIGH (ref 70–99)
Glucose-Capillary: 159 mg/dL — ABNORMAL HIGH (ref 70–99)
Glucose-Capillary: 124 mg/dL — ABNORMAL HIGH (ref 70–99)
Glucose-Capillary: 181 mg/dL — ABNORMAL HIGH (ref 70–99)
Glucose-Capillary: 210 mg/dL — ABNORMAL HIGH (ref 70–99)
Glucose-Capillary: 164 mg/dL — ABNORMAL HIGH (ref 70–99)

## 2019-03-19 LAB — BLOOD GAS, VENOUS
Acid-Base Excess: 5.6 mmol/L — ABNORMAL HIGH (ref 0.0–2.0)
Bicarbonate: 33.1 mmol/L — ABNORMAL HIGH (ref 20.0–28.0)
O2 Saturation: 17.3 %
Patient temperature: 37
pCO2, Ven: 60 mmHg (ref 44.0–60.0)
pH, Ven: 7.35 (ref 7.250–7.430)
pO2, Ven: <31 mmHg — CL (ref 32.0–45.0)

## 2019-03-19 LAB — BETA-HYDROXYBUTYRIC ACID: Beta-Hydroxybutyric Acid: 0.46 mmol/L — ABNORMAL HIGH (ref 0.05–0.27)

## 2019-03-19 LAB — TROPONIN I (HIGH SENSITIVITY)
Troponin I (High Sensitivity): 12 ng/L (ref <18)
Troponin I (High Sensitivity): 11 ng/L (ref <18)

## 2019-03-19 LAB — RESPIRATORY PANEL BY RT PCR (FLU A&B, COVID)
Influenza A by PCR: NEGATIVE
Influenza B by PCR: NEGATIVE
SARS Coronavirus 2 by RT PCR: NEGATIVE

## 2019-03-19 LAB — MAGNESIUM: Magnesium: 2 mg/dL (ref 1.7–2.4)

## 2019-03-19 MED ORDER — ASPIRIN 81 MG PO CHEW
81.0000 mg | CHEWABLE_TABLET | Freq: Every day | ORAL | Status: DC
  Administered 2019-03-19 – 2019-03-20 (×2): 81 mg via ORAL
  Filled 2019-03-19 (×2): qty 1

## 2019-03-19 MED ORDER — SODIUM CHLORIDE 0.9 % IV BOLUS
1000.0000 mL | Freq: Once | INTRAVENOUS | Status: AC
  Administered 2019-03-19: 1000 mL via INTRAVENOUS

## 2019-03-19 MED ORDER — METHADONE HCL 10 MG PO TABS
130.0000 mg | ORAL_TABLET | Freq: Once | ORAL | Status: AC
  Administered 2019-03-19: 130 mg via ORAL
  Filled 2019-03-19: qty 13

## 2019-03-19 MED ORDER — ONDANSETRON HCL 4 MG PO TABS: 4.0000 mg | ORAL_TABLET | Freq: Four times a day (QID) | ORAL | Status: DC | PRN

## 2019-03-19 MED ORDER — BISACODYL 5 MG PO TBEC: 5.0000 mg | DELAYED_RELEASE_TABLET | Freq: Every day | ORAL | Status: DC | PRN

## 2019-03-19 MED ORDER — INSULIN REGULAR(HUMAN) IN NACL 100-0.9 UT/100ML-% IV SOLN
INTRAVENOUS | Status: DC
  Administered 2019-03-19: 10.5 [IU]/h via INTRAVENOUS
  Filled 2019-03-19: qty 100

## 2019-03-19 MED ORDER — DOCUSATE SODIUM 100 MG PO CAPS
100.0000 mg | ORAL_CAPSULE | Freq: Two times a day (BID) | ORAL | Status: DC
  Administered 2019-03-19 – 2019-03-20 (×2): 100 mg via ORAL
  Filled 2019-03-19 (×2): qty 1

## 2019-03-19 MED ORDER — ATORVASTATIN CALCIUM 20 MG PO TABS
80.0000 mg | ORAL_TABLET | Freq: Every day | ORAL | Status: DC
  Administered 2019-03-19 – 2019-03-20 (×2): 80 mg via ORAL
  Filled 2019-03-19 (×3): qty 4

## 2019-03-19 MED ORDER — DEXTROSE 50 % IV SOLN: 0.0000 mL | INTRAVENOUS | Status: DC | PRN

## 2019-03-19 MED ORDER — METHADONE HCL 10 MG PO TABS
130.0000 mg | ORAL_TABLET | Freq: Every day | ORAL | Status: DC
  Administered 2019-03-20: 130 mg via ORAL
  Filled 2019-03-19: qty 13

## 2019-03-19 MED ORDER — CALCIUM GLUCONATE-NACL 1-0.675 GM/50ML-% IV SOLN
1.0000 g | Freq: Once | INTRAVENOUS | Status: AC
  Administered 2019-03-19: 1000 mg via INTRAVENOUS
  Filled 2019-03-19: qty 50

## 2019-03-19 MED ORDER — ACETAMINOPHEN 650 MG RE SUPP: 650.0000 mg | Freq: Four times a day (QID) | RECTAL | Status: DC | PRN

## 2019-03-19 MED ORDER — ALBUTEROL SULFATE (2.5 MG/3ML) 0.083% IN NEBU
2.5000 mg | INHALATION_SOLUTION | Freq: Once | RESPIRATORY_TRACT | Status: AC
  Administered 2019-03-19: 2.5 mg via RESPIRATORY_TRACT
  Filled 2019-03-19: qty 3

## 2019-03-19 MED ORDER — MAGNESIUM OXIDE 400 (241.3 MG) MG PO TABS
400.0000 mg | ORAL_TABLET | Freq: Every day | ORAL | Status: DC
  Administered 2019-03-19 – 2019-03-20 (×2): 400 mg via ORAL
  Filled 2019-03-19 (×2): qty 1

## 2019-03-19 MED ORDER — DEXTROSE-NACL 5-0.45 % IV SOLN
INTRAVENOUS | Status: DC
  Administered 2019-03-19: 17:00:00 via INTRAVENOUS

## 2019-03-19 MED ORDER — HEPARIN SODIUM (PORCINE) 5000 UNIT/ML IJ SOLN
5000.0000 [IU] | Freq: Three times a day (TID) | INTRAMUSCULAR | Status: DC
  Administered 2019-03-19 – 2019-03-20 (×3): 5000 [IU] via SUBCUTANEOUS
  Filled 2019-03-19 (×3): qty 1

## 2019-03-19 MED ORDER — SODIUM CHLORIDE 0.9 % IV SOLN
INTRAVENOUS | Status: DC
  Administered 2019-03-19 (×2): via INTRAVENOUS

## 2019-03-19 MED ORDER — SODIUM CHLORIDE 0.9 % IV SOLN
INTRAVENOUS | Status: DC
  Administered 2019-03-19: 12:00:00 via INTRAVENOUS

## 2019-03-19 MED ORDER — METOPROLOL SUCCINATE ER 50 MG PO TB24
25.0000 mg | ORAL_TABLET | Freq: Every day | ORAL | Status: DC
  Administered 2019-03-19 – 2019-03-20 (×2): 25 mg via ORAL
  Filled 2019-03-19 (×2): qty 1

## 2019-03-19 MED ORDER — ONDANSETRON HCL 4 MG/2ML IJ SOLN: 4.0000 mg | Freq: Four times a day (QID) | INTRAMUSCULAR | Status: DC | PRN

## 2019-03-19 MED ORDER — ACETAMINOPHEN 325 MG PO TABS
650.0000 mg | ORAL_TABLET | Freq: Four times a day (QID) | ORAL | Status: DC | PRN
  Administered 2019-03-20: 650 mg via ORAL
  Filled 2019-03-19: qty 2

## 2019-03-19 MED ORDER — CHLORHEXIDINE GLUCONATE CLOTH 2 % EX PADS
6.0000 | MEDICATED_PAD | Freq: Every day | CUTANEOUS | Status: DC
  Administered 2019-03-19: 6 via TOPICAL
  Filled 2019-03-19: qty 6

## 2019-03-19 NOTE — ED Notes (Signed)
Report was called and given to RN Lelon Frohlich, pt leaving ED

## 2019-03-19 NOTE — ED Notes (Signed)
Date and time results received: 03/19/19 1041 (use smartphrase ".now" to insert current time)  Test: Glucose Critical Value: 966  Name of Provider Notified: Isaacs  Orders Received? Or Actions Taken?: Orders Received - See Orders for details

## 2019-03-19 NOTE — ED Triage Notes (Signed)
Pt reports weakness and dizziness over the last couple of days.

## 2019-03-19 NOTE — ED Notes (Addendum)
20:22; ICU Charge RN called at this time and she stated they will call for report within 10-15mins from this time. If call not received by 20:37, rolling report will be given.

## 2019-03-19 NOTE — H&P (Signed)
[facility] NAME: Ervey Fallin    MR#:  [id number]  DATE OF BIRTH:  July 13, 1970  DATE OF ADMISSION:  03/19/2019  PRIMARY CARE PHYSICIAN: Valerie Roys, DO   REQUESTING/REFERRING PHYSICIAN: Duffy Bruce, MD  CHIEF COMPLAINT:   Chief Complaint  Patient presents with  . Dizziness  . Weakness    HISTORY OF PRESENT ILLNESS:  Bashir Marchetti  is a 48 y.o. male with a known history of diabetes with noncompliance is being admitted for uncontrolled hyperglycemia with nonketosis. Pt reports that for the past 2-3 days, he has felt generally fatigued. He has had associated nausea, dry mouth, and increased urination.  Note patient was here in the hospital from 9/29-10/1 when he was admitted for DKA.  He had outpatient follow-up with Dr. Gabriel Carina on 1and Dr. Gabriel Carina educated patient that DKA may be a higher risk with the addition of [place].3 November when Farxiga was added.  He is being admitted for further evaluation and management. PAST MEDICAL HISTORY:   Past Medical History:  Diagnosis Date  . Diabetes mellitus without complication ([place])   . Drug abuse ([place])   . Hyperlipidemia   . Right arm fracture 2018    PAST SURGICAL HISTORY:   Past Surgical History:  Procedure Laterality Date  . CARDIAC SURGERY    . CARDIAC SURGERY  05/2018   double bypass  . ICD IMPLANT    . LEFT HEART CATH AND CORONARY ANGIOGRAPHY Right 05/06/2018   Procedure: LEFT HEART CATH AND CORONARY ANGIOGRAPHY;  Surgeon: Dionisio David, MD;  Location: [facility];  Service: Cardiovascular;  Laterality: Right;    SOCIAL HISTORY:   Social History   Tobacco Use  . Smoking status: Former Smoker    Packs/day: 0.50    Types: Cigarettes    Quit date: 05/05/2018    Years since quitting: 0.8  . Smokeless tobacco: Never Used  Substance Use Topics  . Alcohol use: No    FAMILY HISTORY:   Family History  Problem Relation Age of Onset  . Mental illness Mother   . Heart  attack Father   . Heart disease Father   . Mental illness Sister   . Cancer Maternal Aunt        Lung cancer  . Prostate cancer Neg Hx   . Bladder Cancer Neg Hx   . Kidney cancer Neg Hx     DRUG ALLERGIES:   Allergies  Allergen Reactions  . Lyrica [Pregabalin] Other (See Comments)    Peripheral edema  . Metformin And Related Nausea And Vomiting    REVIEW OF SYSTEMS:   Review of Systems  Constitutional: Positive for malaise/fatigue. Negative for diaphoresis, fever and weight loss.  HENT: Negative for ear discharge, ear pain, hearing loss, nosebleeds, sore throat and tinnitus.   Eyes: Negative for blurred vision and pain.  Respiratory: Negative for cough, hemoptysis, shortness of breath and wheezing.   Cardiovascular: Negative for chest pain, palpitations, orthopnea and leg swelling.  Gastrointestinal: Positive for nausea. Negative for abdominal pain, blood in stool, constipation, diarrhea, heartburn and vomiting.  Genitourinary: Positive for frequency. Negative for dysuria and urgency.  Musculoskeletal: Negative for back pain and myalgias.  Skin: Negative for itching and rash.  Neurological: Positive for dizziness. Negative for tingling, tremors, focal weakness, seizures, weakness and headaches.  Psychiatric/Behavioral: Negative for depression. The patient is not nervous/anxious.     MEDICATIONS AT HOME:   Prior to Admission medications   Medication Sig  Start Date End Date Taking? Authorizing Provider  aspirin 81 MG chewable tablet Chew 81 mg by mouth daily.    Yes [provider]  atorvastatin (LIPITOR) 80 MG tablet Take 80 mg by mouth daily. 07/30/18  Yes [provider]  Cholecalciferol (VITAMIN D-1000 MAX ST) 25 MCG (1000 UT) tablet Take 5,000 mcg by mouth daily.   Yes [provider]  FARXIGA 10 MG TABS tablet Take 10 mg by mouth daily. 03/05/19  Yes [provider]  furosemide (LASIX) 40 MG tablet Take 1 tablet (40 mg total) by mouth  daily. Patient taking differently: Take 40 mg by mouth daily as needed.  10/16/18  Yes Mody, Patricia Pesa, MD  insulin aspart (NOVOLOG) 100 UNIT/ML injection Inject 6-15 Units into the skin 3 (three) times daily before meals.  10/02/18 03/19/19 Yes [provider]  LEVEMIR FLEXTOUCH 100 UNIT/ML Pen Inject 40 Units into the skin at bedtime. 01/23/19  Yes [name], [name], DO  losartan (COZAAR) 25 MG tablet Take 25 mg by mouth daily. 07/30/18  Yes [provider]  magnesium oxide (MAG-OX) 400 MG tablet Take 400 mg by mouth daily.   Yes [provider]  methadone (DOLOPHINE) 10 MG tablet Take 130 mg by mouth daily.    Yes [provider]  metoCLOPramide (REGLAN) 10 MG tablet Take 5 mg by mouth every 6 (six) hours as needed for nausea/vomiting. 10/02/18  Yes [provider]  metoprolol succinate (TOPROL-XL) 25 MG 24 hr tablet Take 25 mg by mouth daily. 07/30/18  Yes [provider]      VITAL SIGNS:  Blood pressure (!) 141/88, pulse 88, temperature 97.8 F (36.6 C), temperature source Oral, resp. rate 15, height 6' (1.829 m), weight 72.6 kg, SpO2 99 %.  PHYSICAL EXAMINATION:  Physical Exam  GENERAL:  47 y.o.-year-old patient lying in the bed with no acute distress.  EYES: Pupils equal, round, reactive to light and accommodation. No scleral icterus. Extraocular muscles intact.  HEENT: Head atraumatic, normocephalic. Oropharynx and nasopharynx clear.  NECK:  Supple, no jugular venous distention. No thyroid enlargement, no tenderness.  LUNGS: Normal breath sounds bilaterally, no wheezing, rales,rhonchi or crepitation. No use of accessory muscles of respiration.  CARDIOVASCULAR: S1, S2 normal. No murmurs, rubs, or gallops.  ABDOMEN: Soft, nontender, nondistended. Bowel sounds present. No organomegaly or mass.  EXTREMITIES: No pedal edema, cyanosis, or clubbing.  NEUROLOGIC: Cranial nerves II through XII are intact. Muscle strength 5/5 in all extremities.  Sensation intact. Gait not checked.  PSYCHIATRIC: The patient is alert and oriented x 3.  SKIN: No obvious rash, lesion, or ulcer.   LABORATORY PANEL:   CBC Recent Labs  Lab 03/19/19 1011  WBC 6.9  HGB 12.3*  HCT 37.3*  PLT 283   ------------------------------------------------------------------------------------------------------------------  Chemistries  Recent Labs  Lab 03/19/19 1011  NA 121*  K 6.1*  CL 82*  CO2 25  GLUCOSE 966*  BUN 36*  CREATININE 1.76*  CALCIUM 9.2  MG 2.0  AST 18  ALT 20  ALKPHOS 164*  BILITOT 0.7   ------------------------------------------------------------------------------------------------------------------  Cardiac Enzymes No results for input(s): TROPONINI in the last 168 hours. ------------------------------------------------------------------------------------------------------------------  RADIOLOGY:  Dg Chest Portable 1 View  Result Date: 03/19/2019 CLINICAL DATA:  Shortness of breath. EXAM: PORTABLE CHEST 1 VIEW COMPARISON:  January 07, 2019. FINDINGS: The heart size and mediastinal contours are within normal limits. Both lungs are clear. No pneumothorax or pleural effusion is noted. Left-sided pacemaker is unchanged in position. Sternotomy wires are  noted. The visualized skeletal structures are unremarkable. IMPRESSION: No active disease. Electronically Signed   By: Lupita RaiderJames  Green Jr M.D.   On: 03/19/2019 11:06   IMPRESSION AND PLAN:  Patient is a 48 year old male with history of diabetes mellitus,chronic pains on methadone and chronic systolic and diastolic CHF with ejection fraction of 20% who is being admitted for management of uncontrolled hyperglycemia with blood sugar of more than 900.  1.Diabetes mellitus with hyperglycemia - Blood sugar more than 900 - Anion gap of 14. Patient started on insulin drip.  Already given IV fluid boluses in the emergency room. Appears dehydrated.  Given underlying history of CHF with  ejection fraction of 20% will be very cautious with excessive hydration to prevent fluid overload.  Continued IV fluid with normal saline at 75 cc an hour while monitoring blood sugars closely. Serial BMP every 4 hours.  Glycosylated hemoglobin level. Being admitted to ICU/SD  2.    Acute kidney injury/hyperkalemia -Likely from severe dehydration and uncontrolled hyperglycemia -He is making urine.  I do expect this to correct we will check stat CMP  3.  History of chronic pains Patient on methadone.  Continue the same.  4.  Hyponatremia with sodium of 121 Most likely pseudohyponatremia from hyperglycemia. Expect improvement with correction of hyperglycemia.  5.  Hypertension Resume home blood pressure medications.  Monitor.  6. Chronic diastolic and systolic CHF Last ejection fraction of 20% in January 2020. Stable at this time.  Holding off on Lasix for now while monitoring cardiopulmonary status closely with ongoing gentle IV fluid hydration  Covid test is pending at this time but likely only for screening    All the records are reviewed and case discussed with ED provider. Management plans discussed with the patient, nursing and they are in agreement.  CODE STATUS: Full code  TOTAL TIME TAKING CARE OF THIS PATIENT: 45 minutes.    Delfino LovettVipul Heydi Swango M.D on 03/19/2019 at 2:07 PM  Between 7am to 6pm - Pager - [phone]  After 6pm go to [email or web address] - password [email or web address]  [facility]   CC: Primary care physician; Dorcas CarrowJohnson, [name], DO   Note: This dictation was prepared with Dragon dictation along with smaller phrase technology. Any transcriptional errors that result from this process are unintentional.

## 2019-03-19 NOTE — Progress Notes (Signed)
Inpatient Diabetes Program Recommendations  AACE/ADA: New Consensus Statement on Inpatient Glycemic Control (2015)  Target Ranges:  Prepandial:   less than 140 mg/dL      Peak postprandial:   less than 180 mg/dL (1-2 hours)      Critically ill patients:  140 - 180 mg/dL   Lab Results  Component Value Date   GLUCAP >600 ([place]) 03/19/2019   HGBA1C 13.7 ([facility]) 01/16/2019    Review of Glycemic Control  Diabetes history: DM Outpatient Diabetes medications: Levemir 40 units + Novolog 6-15 units tid meal coverage + Farxiga (added @ Dr. Gabriel Carina office visit 02/21/19) Current orders for Inpatient glycemic control: IV insulin drip  Inpatient Diabetes Program Recommendations:   Patient was discharged on 01/09/19 from [facility] due to dx DKA. Followed up with endocrinologist Dr. Gabriel Carina on 02/21/19 and Wilder Glade was added and Dr. Gabriel Carina educated patient that DKA may be a higher risk with the addition of [place]. Will follow during hospitalization.  Thank you, Nani Gasser. Jermey Closs, RN, MSN, CDE  Diabetes Coordinator Inpatient Glycemic Control Team Team Pager [phone] (8am-5pm) 03/19/2019 1:45 PM

## 2019-03-19 NOTE — ED Notes (Signed)
Date and time results received: 03/19/19 1611 (use smartphrase ".now" to insert current time)  Test: K+ Critical Value: 3.4  Name of Provider Notified: Manuella Ghazi  Orders Received? Or Actions Taken?: Message acknowledged, no new orders.

## 2019-03-19 NOTE — ED Provider Notes (Signed)
[facility] Emergency Department Provider Note  ____________________________________________   First MD Initiated Contact with Patient 03/19/19 1008     (approximate)  I have reviewed the triage vital signs and the nursing notes.   HISTORY  Chief Complaint Dizziness and Weakness    HPI Jesse Christian is a 48 y.o. male  Here with weakness. Pt reports that for the past 2-3 days, he has felt generally fatigued. He has had associated nausea, dry mouth, and increased urination. No shortness of breath. He has been taking his meds as prescribed and says his sugars havent been "that bad." No CP. No SOB. NO known sick contacts. No vomiting or diarrhea.    Denies recent drug use.        Past Medical History:  Diagnosis Date   Diabetes mellitus without complication (HCC)    Drug abuse (HCC)    Hyperlipidemia    Right arm fracture 2018    Patient Active Problem List   Diagnosis Date Noted   Uncontrolled type 2 DM with hyperosmolar nonketotic hyperglycemia (HCC) 03/19/2019   AKI (acute kidney injury) (HCC)    Hyperglycemia 01/07/2019   Type 2 diabetes mellitus with hyperosmolar nonketotic hyperglycemia (HCC) 10/15/2018   Acute CHF (HCC) 08/21/2018   Chest pain 08/21/2018   Acute on chronic combined systolic and diastolic CHF (congestive heart failure) (HCC) 08/21/2018   Acute CHF (congestive heart failure) (HCC) 08/21/2018   Hyperglycemia without ketosis 07/14/2018   Malnutrition of moderate degree 05/07/2018   Acute systolic heart failure (HCC) 05/06/2018   Foot infection 09/23/2017   Benign prostatic hyperplasia with nocturia 06/26/2016   Anxiety disorder due to medical condition 06/22/2015   Insomnia 06/22/2015   Hep C w/o coma, chronic (HCC) 06/10/2015   Benign hypertensive renal disease 06/08/2015   History of drug abuse in remission (HCC) 06/08/2015   Diabetes mellitus with hyperglycemia, with long-term current use of  insulin (HCC)    Hyperlipidemia     Past Surgical History:  Procedure Laterality Date   CARDIAC SURGERY     CARDIAC SURGERY  05/2018   double bypass   ICD IMPLANT     LEFT HEART CATH AND CORONARY ANGIOGRAPHY Right 05/06/2018   Procedure: LEFT HEART CATH AND CORONARY ANGIOGRAPHY;  Surgeon: Laurier Nancy, MD;  Location: [facility] INVASIVE CV LAB;  Service: Cardiovascular;  Laterality: Right;    Prior to Admission medications   Medication Sig Start Date End Date Taking? Authorizing Provider  aspirin 81 MG chewable tablet Chew 81 mg by mouth daily.    Yes [provider]  atorvastatin (LIPITOR) 80 MG tablet Take 80 mg by mouth daily. 07/30/18  Yes [provider]  Cholecalciferol (VITAMIN D-1000 MAX ST) 25 MCG (1000 UT) tablet Take 5,000 mcg by mouth daily.   Yes [provider]  FARXIGA 10 MG TABS tablet Take 10 mg by mouth daily. 03/05/19  Yes [provider]  furosemide (LASIX) 40 MG tablet Take 1 tablet (40 mg total) by mouth daily. Patient taking differently: Take 40 mg by mouth daily as needed.  10/16/18  Yes Mody, Patricia Pesa, MD  insulin aspart (NOVOLOG) 100 UNIT/ML injection Inject 6-15 Units into the skin 3 (three) times daily before meals.  10/02/18 03/19/19 Yes [provider]  LEVEMIR FLEXTOUCH 100 UNIT/ML Pen Inject 40 Units into the skin at bedtime. 01/23/19  Yes [name], [name], DO  losartan (COZAAR) 25 MG tablet Take 25 mg by mouth daily. 07/30/18  Yes [provider]  magnesium oxide (MAG-OX) 400 MG tablet Take 400 mg by mouth daily.   Yes [provider]  methadone (DOLOPHINE) 10 MG tablet Take 130 mg by mouth daily.    Yes [provider]  metoCLOPramide (REGLAN) 10 MG tablet Take 5 mg by mouth every 6 (six) hours as needed for nausea/vomiting. 10/02/18  Yes [provider]  metoprolol succinate (TOPROL-XL) 25 MG 24 hr tablet Take 25 mg by mouth daily. 07/30/18  Yes [provider]     Allergies Lyrica [pregabalin] and Metformin and related  Family History  Problem Relation Age of Onset   Mental illness Mother    Heart attack Father    Heart disease Father    Mental illness Sister    Cancer Maternal Aunt        Lung cancer   Prostate cancer Neg Hx    Bladder Cancer Neg Hx    Kidney cancer Neg Hx     Social History Social History   Tobacco Use   Smoking status: Former Smoker    Packs/day: 0.50    Types: Cigarettes    Quit date: 05/05/2018    Years since quitting: 0.8   Smokeless tobacco: Never Used  Substance Use Topics   Alcohol use: No   Drug use: No    Review of Systems  Review of Systems  Constitutional: Positive for fatigue. Negative for chills and fever.  HENT: Negative for sore throat.   Respiratory: Negative for shortness of breath.   Cardiovascular: Negative for chest pain.  Gastrointestinal: Negative for abdominal pain.  Endocrine: Positive for polydipsia and polyuria.  Genitourinary: Negative for flank pain.  Musculoskeletal: Negative for neck pain.  Skin: Negative for rash and wound.  Allergic/Immunologic: Negative for immunocompromised state.  Neurological: Positive for weakness and light-headedness. Negative for numbness.  Hematological: Does not bruise/bleed easily.     ____________________________________________  PHYSICAL EXAM:      VITAL SIGNS: ED Triage Vitals  Enc Vitals Group     BP 03/19/19 0925 (!) 165/97     Pulse Rate 03/19/19 0925 80     Resp 03/19/19 1000 12     Temp 03/19/19 0925 97.8 F (36.6 C)     Temp Source 03/19/19 0925 Oral     SpO2 03/19/19 0925 98 %     Weight 03/19/19 0846 160 lb (72.6 kg)     Height 03/19/19 0846 6' (1.829 m)     Head Circumference --      Peak Flow --      Pain Score 03/19/19 0846 0     Pain Loc --      Pain Edu? --      Excl. in GC? --      Physical Exam Vitals signs and nursing note reviewed.  Constitutional:      General: He is not in acute  distress.    Appearance: He is well-developed.  HENT:     Head: Normocephalic and atraumatic.     Mouth/Throat:     Mouth: Mucous membranes are dry.  Eyes:     Conjunctiva/sclera: Conjunctivae normal.  Neck:     Musculoskeletal: Neck supple.  Cardiovascular:     Rate and Rhythm: Normal rate and regular rhythm.     Heart sounds: Normal heart sounds. No murmur. No friction rub.  Pulmonary:     Effort: Pulmonary effort is normal. No respiratory distress.     Breath sounds: Normal breath sounds. No wheezing or rales.  Abdominal:  General: There is no distension.     Palpations: Abdomen is soft.     Tenderness: There is no abdominal tenderness.  Skin:    General: Skin is warm.     Capillary Refill: Capillary refill takes less than 2 seconds.  Neurological:     Mental Status: He is alert and oriented to person, place, and time.     Motor: No abnormal muscle tone.       ____________________________________________   LABS (all labs ordered are listed, but only abnormal results are displayed)  Labs Reviewed  CBC WITH DIFFERENTIAL/PLATELET - Abnormal; Notable for the following components:      Result Value   Hemoglobin 12.3 (*)    HCT 37.3 (*)    All other components within normal limits  COMPREHENSIVE METABOLIC PANEL - Abnormal; Notable for the following components:   Sodium 121 (*)    Potassium 6.1 (*)    Chloride 82 (*)    Glucose, Bld 966 (*)    BUN 36 (*)    Creatinine, Ser 1.76 (*)    Alkaline Phosphatase 164 (*)    GFR calc non Af Amer 45 (*)    GFR calc Af Amer 53 (*)    All other components within normal limits  BLOOD GAS, VENOUS - Abnormal; Notable for the following components:   pO2, Ven <31.0 (*)    Bicarbonate 33.1 (*)    Acid-Base Excess 5.6 (*)    All other components within normal limits  BETA-HYDROXYBUTYRIC ACID - Abnormal; Notable for the following components:   Beta-Hydroxybutyric Acid 0.46 (*)    All other components within normal limits   GLUCOSE, CAPILLARY - Abnormal; Notable for the following components:   Glucose-Capillary >600 (*)    All other components within normal limits  GLUCOSE, CAPILLARY - Abnormal; Notable for the following components:   Glucose-Capillary 529 (*)    All other components within normal limits  COMPREHENSIVE METABOLIC PANEL - Abnormal; Notable for the following components:   Sodium 133 (*)    Potassium 3.4 (*)    Chloride 95 (*)    Glucose, Bld 379 (*)    BUN 32 (*)    Creatinine, Ser 1.48 (*)    Alkaline Phosphatase 127 (*)    GFR calc non Af Amer 56 (*)    All other components within normal limits  GLUCOSE, CAPILLARY - Abnormal; Notable for the following components:   Glucose-Capillary 441 (*)    All other components within normal limits  GLUCOSE, CAPILLARY - Abnormal; Notable for the following components:   Glucose-Capillary 361 (*)    All other components within normal limits  GLUCOSE, CAPILLARY - Abnormal; Notable for the following components:   Glucose-Capillary 230 (*)    All other components within normal limits  RESPIRATORY PANEL BY RT PCR (FLU A&B, COVID)  MAGNESIUM  BASIC METABOLIC PANEL  CBC  TROPONIN I (HIGH SENSITIVITY)  TROPONIN I (HIGH SENSITIVITY)    ____________________________________________  EKG: Normal sinus rhythm, VR 80. PR 176, QRS 104, QTc 475. When compared to prior, no acute ischemia or infarct.   EKG Interpretation  Date/Time:  Wednesday March 19 2019 09:19:40 EST Ventricular Rate:  80 PR Interval:  176 QRS Duration: 104 QT Interval:  412 QTC Calculation: 475 R Axis:   45 Text Interpretation: Normal sinus rhythm Left atrial enlargement T wave abnormality, consider lateral ischemia Prolonged QT Abnormal ECG When compared with ECG of 19-Mar-2019 09:18, (unconfirmed) No significant change was found Confirmed by UNCONFIRMED, DOCTOR (  [id number]), editor Fredric Mare, Tammy [phone]) on 03/19/2019 10:13:40 AM        ________________________________________  RADIOLOGY All imaging, including plain films, CT scans, and ultrasounds, independently reviewed by me, and interpretations confirmed via formal radiology reads.  ED MD interpretation:   CXR: Negative  Official radiology report(s): Dg Chest Portable 1 View  Result Date: 03/19/2019 CLINICAL DATA:  Shortness of breath. EXAM: PORTABLE CHEST 1 VIEW COMPARISON:  January 07, 2019. FINDINGS: The heart size and mediastinal contours are within normal limits. Both lungs are clear. No pneumothorax or pleural effusion is noted. Left-sided pacemaker is unchanged in position. Sternotomy wires are noted. The visualized skeletal structures are unremarkable. IMPRESSION: No active disease. Electronically Signed   By: Lupita Raider M.D.   On: 03/19/2019 11:06    ____________________________________________  PROCEDURES   Procedure(s) performed (including Critical Care):  .Critical Care Performed by: Shaune Pollack, MD Authorized by: Shaune Pollack, MD   Critical care provider statement:    Critical care time (minutes):  35   Critical care time was exclusive of:  Separately billable procedures and treating other patients and teaching time   Critical care was necessary to treat or prevent imminent or life-threatening deterioration of the following conditions:  Cardiac failure, circulatory failure and metabolic crisis   Critical care was time spent personally by me on the following activities:  Development of treatment plan with patient or surrogate, discussions with consultants, evaluation of patient's response to treatment, examination of patient, obtaining history from patient or surrogate, ordering and performing treatments and interventions, ordering and review of laboratory studies, ordering and review of radiographic studies, pulse oximetry, re-evaluation of patient's condition and review of old charts   I assumed direction of critical care for this  patient from another provider in my specialty: no      ____________________________________________  INITIAL IMPRESSION / MDM / ASSESSMENT AND PLAN / ED COURSE  As part of my medical decision making, I reviewed the following data within the electronic MEDICAL RECORD NUMBER Nursing notes reviewed and incorporated, Old chart reviewed, Notes from prior ED visits, and [place] Controlled Substance Database       *Jesse Christian was evaluated in Emergency Department on 03/19/2019 for the symptoms described in the history of present illness. He was evaluated in the context of the global COVID-19 pandemic, which necessitated consideration that the patient might be at risk for infection with the SARS-CoV-2 virus that causes COVID-19. Institutional protocols and algorithms that pertain to the evaluation of patients at risk for COVID-19 are in a state of rapid change based on information released by regulatory bodies including the CDC and federal and state organizations. These policies and algorithms were followed during the patient's care in the ED.  Some ED evaluations and interventions may be delayed as a result of limited staffing during the pandemic.*     Medical Decision Making:  48 yo M here with HHS/hyperglycemic crisis. Normal AG, normal PH no signs of DKA. Pt given IVF, placed on insulin drip, and will admit. He is otherwise HDS. He is hyperkalemic but no significant EKG changes, and he has been temporized with calcium while receiving insulin. Admit to step down.  ____________________________________________  FINAL CLINICAL IMPRESSION(S) / ED DIAGNOSES  Final diagnoses:  Hyperglycemic crisis in diabetes mellitus (HCC)     MEDICATIONS GIVEN DURING THIS VISIT:  Medications  insulin regular, human (MYXREDLIN) 100 units/ 100 mL infusion (3.4 Units/hr Intravenous Rate/Dose Change 03/19/19 1634)  0.9 %  sodium chloride infusion (  Intravenous Stopped 03/19/19 1634)  dextrose 5 %-0.45 % sodium chloride  infusion ( Intravenous New Bag/Given 03/19/19 1648)  dextrose 50 % solution 0-50 mL (has no administration in time range)  aspirin chewable tablet 81 mg (81 mg Oral Given 03/19/19 1649)  methadone (DOLOPHINE) tablet 130 mg (130 mg Oral Not Given 03/19/19 1609)  atorvastatin (LIPITOR) tablet 80 mg (80 mg Oral Given 03/19/19 1649)  metoprolol succinate (TOPROL-XL) 24 hr tablet 25 mg (25 mg Oral Given 03/19/19 1649)  magnesium oxide (MAG-OX) tablet 400 mg (400 mg Oral Given 03/19/19 1649)  heparin injection 5,000 Units (5,000 Units Subcutaneous Given 03/19/19 1649)  0.9 %  sodium chloride infusion ( Intravenous New Bag/Given 03/19/19 1609)  acetaminophen (TYLENOL) tablet 650 mg (has no administration in time range)    Or  acetaminophen (TYLENOL) suppository 650 mg (has no administration in time range)  docusate sodium (COLACE) capsule 100 mg (has no administration in time range)  bisacodyl (DULCOLAX) EC tablet 5 mg (has no administration in time range)  ondansetron (ZOFRAN) tablet 4 mg (has no administration in time range)    Or  ondansetron (ZOFRAN) injection 4 mg (has no administration in time range)  sodium chloride 0.9 % bolus 1,000 mL (0 mLs Intravenous Stopped 03/19/19 1357)  calcium gluconate 1 g/ 50 mL sodium chloride IVPB (0 mg Intravenous Stopped 03/19/19 1301)  albuterol (PROVENTIL) (2.5 MG/3ML) 0.083% nebulizer solution 2.5 mg (2.5 mg Nebulization Given 03/19/19 1157)  methadone (DOLOPHINE) tablet 130 mg (130 mg Oral Given 03/19/19 1350)     ED Discharge Orders    None       Note:  This document was prepared using Dragon voice recognition software and may include unintentional dictation errors.   Duffy Bruce, MD 03/19/19 [phone]

## 2019-03-19 NOTE — ED Notes (Signed)
Pt resting on stretcher with eyes closed and even respirations. Pt has no complaints of pain at this time. Provided for pt safety and comfort and will continue to assess.

## 2019-03-20 DIAGNOSIS — I10 Essential (primary) hypertension: Secondary | ICD-10-CM

## 2019-03-20 DIAGNOSIS — E871 Hypo-osmolality and hyponatremia: Secondary | ICD-10-CM | POA: Diagnosis not present

## 2019-03-20 DIAGNOSIS — E1301 Other specified diabetes mellitus with hyperosmolarity with coma: Secondary | ICD-10-CM | POA: Diagnosis present

## 2019-03-20 DIAGNOSIS — N179 Acute kidney failure, unspecified: Secondary | ICD-10-CM | POA: Diagnosis not present

## 2019-03-20 DIAGNOSIS — I5022 Chronic systolic (congestive) heart failure: Secondary | ICD-10-CM

## 2019-03-20 LAB — CBC
HCT: 32.2 % — ABNORMAL LOW (ref 39.0–52.0)
Hemoglobin: 10.8 g/dL — ABNORMAL LOW (ref 13.0–17.0)
MCH: 27.6 pg (ref 26.0–34.0)
MCHC: 33.5 g/dL (ref 30.0–36.0)
MCV: 82.4 fL (ref 80.0–100.0)
Platelets: 249 10*3/uL (ref 150–400)
RBC: 3.91 MIL/uL — ABNORMAL LOW (ref 4.22–5.81)
RDW: 12.2 % (ref 11.5–15.5)
WBC: 6.5 10*3/uL (ref 4.0–10.5)
nRBC: 0 % (ref 0.0–0.2)

## 2019-03-20 LAB — GLUCOSE, CAPILLARY
Glucose-Capillary: 148 mg/dL — ABNORMAL HIGH (ref 70–99)
Glucose-Capillary: 178 mg/dL — ABNORMAL HIGH (ref 70–99)
Glucose-Capillary: 182 mg/dL — ABNORMAL HIGH (ref 70–99)
Glucose-Capillary: 187 mg/dL — ABNORMAL HIGH (ref 70–99)
Glucose-Capillary: 213 mg/dL — ABNORMAL HIGH (ref 70–99)
Glucose-Capillary: 226 mg/dL — ABNORMAL HIGH (ref 70–99)
Glucose-Capillary: 85 mg/dL (ref 70–99)

## 2019-03-20 LAB — BASIC METABOLIC PANEL
Anion gap: 10 (ref 5–15)
BUN: 22 mg/dL — ABNORMAL HIGH (ref 6–20)
CO2: 23 mmol/L (ref 22–32)
Calcium: 8.5 mg/dL — ABNORMAL LOW (ref 8.9–10.3)
Chloride: 101 mmol/L (ref 98–111)
Creatinine, Ser: 1.18 mg/dL (ref 0.61–1.24)
GFR calc Af Amer: >60 mL/min (ref >60)
GFR calc non Af Amer: >60 mL/min (ref >60)
Glucose, Bld: 198 mg/dL — ABNORMAL HIGH (ref 70–99)
Potassium: 3.5 mmol/L (ref 3.5–5.1)
Sodium: 134 mmol/L — ABNORMAL LOW (ref 135–145)

## 2019-03-20 LAB — HEMOGLOBIN A1C
Hgb A1c MFr Bld: 13.2 % — ABNORMAL HIGH (ref 4.8–5.6)
Mean Plasma Glucose: 332.14 mg/dL

## 2019-03-20 LAB — MRSA PCR SCREENING: MRSA by PCR: NEGATIVE

## 2019-03-20 MED ORDER — INSULIN ASPART 100 UNIT/ML ~~LOC~~ SOLN
0.0000 [IU] | Freq: Three times a day (TID) | SUBCUTANEOUS | Status: DC
  Administered 2019-03-20: 7 [IU] via SUBCUTANEOUS
  Filled 2019-03-20: qty 1

## 2019-03-20 MED ORDER — POTASSIUM CHLORIDE CRYS ER 20 MEQ PO TBCR
40.0000 meq | EXTENDED_RELEASE_TABLET | Freq: Once | ORAL | Status: AC
  Administered 2019-03-20: 40 meq via ORAL
  Filled 2019-03-20: qty 2

## 2019-03-20 MED ORDER — FUROSEMIDE 40 MG PO TABS: 40.0000 mg | ORAL_TABLET | Freq: Every day | ORAL | Status: AC | PRN

## 2019-03-20 MED ORDER — LEVEMIR FLEXTOUCH 100 UNIT/ML ~~LOC~~ SOPN: 40.0000 [IU] | PEN_INJECTOR | Freq: Every day | SUBCUTANEOUS | 1 refills | Status: DC

## 2019-03-20 MED ORDER — INSULIN DETEMIR 100 UNIT/ML ~~LOC~~ SOLN
40.0000 [IU] | Freq: Every day | SUBCUTANEOUS | Status: DC
  Administered 2019-03-20: 40 [IU] via SUBCUTANEOUS
  Filled 2019-03-20 (×2): qty 0.4

## 2019-03-20 NOTE — Discharge Summary (Signed)
[facility] [place] NAME: Jesse Christian    MR#:  [id number]  DATE OF BIRTH:  04/09/1971  DATE OF ADMISSION:  03/19/2019 ADMITTING PHYSICIAN: Loletha Grayer, MD  DATE OF DISCHARGE: 03/20/2019  PRIMARY CARE PHYSICIAN: Valerie Roys, DO    ADMISSION DIAGNOSIS:  Hyperglycemic crisis in diabetes mellitus ([place]) [E11.65] Hyperosmolar coma due to secondary diabetes ([place]) [E13.01]  DISCHARGE DIAGNOSIS:  Active Problems:   Uncontrolled type 2 DM with hyperosmolar nonketotic hyperglycemia (HCC)   Hyperosmolar coma due to secondary diabetes [facility])    HOSPITAL COURSE:   1.  Hyperosmolar coma secondary to type 2 diabetes.  Hemoglobin A1c elevated at 13.2.  Patient was placed on insulin drip for sugar of 966.  Anion gap was 14.  Patient was converted back to his Levemir insulin 40 units.  Patient states that he has his insulin at home.  Discontinue Farxiga.  Patient states that he has a follow-up appointment tomorrow with Dr. Gabriel Carina endocrinology. 2.  Acute kidney injury with hyperkalemia secondary to uncontrolled diabetes mellitus with hyperglycemia.  Patient was held off his losartan.  Can potentially restart as outpatient.  Creatinine improved from 1.76 down to 1.18. 3.  Chronic pain syndrome on methadone 4.  Hyponatremia secondary to hyperglycemia. 5.  Hypertension.  Can go back on all medications except for the losartan for right now. 6.  Chronic systolic congestive heart failure.  No signs of heart failure at this time.  Recommend checking labs as outpatient and restarting losartan.  DISCHARGE CONDITIONS:   Satisfactory  CONSULTS OBTAINED:  None  DRUG ALLERGIES:   Allergies  Allergen Reactions  . Lyrica [Pregabalin] Other (See Comments)    Peripheral edema  . Metformin And Related Nausea And Vomiting    DISCHARGE MEDICATIONS:   Allergies as of 03/20/2019      Reactions   Lyrica [pregabalin] Other (See Comments)   Peripheral  edema   Metformin And Related Nausea And Vomiting      Medication List    STOP taking these medications   Farxiga 10 MG Tabs tablet Generic drug: dapagliflozin propanediol   losartan 25 MG tablet Commonly known as: COZAAR     TAKE these medications   aspirin 81 MG chewable tablet Chew 81 mg by mouth daily.   atorvastatin 80 MG tablet Commonly known as: LIPITOR Take 80 mg by mouth daily.   furosemide 40 MG tablet Commonly known as: LASIX Take 1 tablet (40 mg total) by mouth daily as needed.   Levemir FlexTouch 100 UNIT/ML Pen Generic drug: Insulin Detemir Inject 40 Units into the skin daily. What changed: when to take this   magnesium oxide 400 MG tablet Commonly known as: MAG-OX Take 400 mg by mouth daily.   methadone 10 MG tablet Commonly known as: DOLOPHINE Take 130 mg by mouth daily.   metoCLOPramide 10 MG tablet Commonly known as: REGLAN Take 5 mg by mouth every 6 (six) hours as needed for nausea/vomiting.   metoprolol succinate 25 MG 24 hr tablet Commonly known as: TOPROL-XL Take 25 mg by mouth daily.   NovoLOG 100 UNIT/ML injection Generic drug: insulin aspart Inject 6-15 Units into the skin 3 (three) times daily before meals.   Vitamin D-1000 Max St 25 MCG (1000 UT) tablet Generic drug: Cholecalciferol Take 5,000 mcg by mouth daily.        DISCHARGE INSTRUCTIONS:   Follow-up PMD 5 days Patient states he has a follow-up tomorrow with Dr. Gabriel Carina endocrinology.  If you experience worsening of your admission symptoms, develop shortness of breath, life threatening emergency, suicidal or homicidal thoughts you must seek medical attention immediately by calling 911 or calling your MD immediately  if symptoms less severe.  You Must read complete instructions/literature along with all the possible adverse reactions/side effects for all the Medicines you take and that have been prescribed to you. Take any new Medicines after you have completely understood  and accept all the possible adverse reactions/side effects.   Please note  You were cared for by a hospitalist during your hospital stay. If you have any questions about your discharge medications or the care you received while you were in the hospital after you are discharged, you can call the unit and asked to speak with the hospitalist on call if the hospitalist that took care of you is not available. Once you are discharged, your primary care physician will handle any further medical issues. Please note that NO REFILLS for any discharge medications will be authorized once you are discharged, as it is imperative that you return to your primary care physician (or establish a relationship with a primary care physician if you do not have one) for your aftercare needs so that they can reassess your need for medications and monitor your lab values.    Today   CHIEF COMPLAINT:   Chief Complaint  Patient presents with  . Dizziness  . Weakness    HISTORY OF PRESENT ILLNESS:  Jesse Christian  is a 48 y.o. male coming in with dizziness and weakness and found to have a sugar of 966.   VITAL SIGNS:  Blood pressure (!) 147/98, pulse 65, temperature 98 F (36.7 C), temperature source Oral, resp. rate 12, height 6' (1.829 m), weight 69.6 kg, SpO2 100 %.  I/O:    Intake/Output Summary (Last 24 hours) at 03/20/2019 1648 Last data filed at 03/20/2019 0900 Gross per 24 hour  Intake 2368.18 ml  Output 1075 ml  Net 1293.18 ml    PHYSICAL EXAMINATION:  GENERAL:  48 y.o.-year-old patient lying in the bed with no acute distress.  EYES: Pupils equal, round, reactive to light and accommodation. No scleral icterus. Extraocular muscles intact.  HEENT: Head atraumatic, normocephalic. Oropharynx and nasopharynx clear.  Tympanic membrane bulging but no erythema NECK:  Supple, no jugular venous distention. No thyroid enlargement, no tenderness.  LUNGS: Normal breath sounds bilaterally, no wheezing,  rales,rhonchi or crepitation. No use of accessory muscles of respiration.  CARDIOVASCULAR: S1, S2 normal. No murmurs, rubs, or gallops.  ABDOMEN: Soft, non-tender, non-distended. Bowel sounds present. No organomegaly or mass.  EXTREMITIES: No pedal edema, cyanosis, or clubbing.  NEUROLOGIC: Cranial nerves II through XII are intact. Muscle strength 5/5 in all extremities. Sensation intact. Gait not checked.  PSYCHIATRIC: The patient is alert and oriented x 3.  SKIN: No obvious rash, lesion, or ulcer.   DATA REVIEW:   CBC Recent Labs  Lab 03/20/19 0530  WBC 6.5  HGB 10.8*  HCT 32.2*  PLT 249    Chemistries  Recent Labs  Lab 03/19/19 1011 03/19/19 1515 03/20/19 0530  NA 121* 133* 134*  K 6.1* 3.4* 3.5  CL 82* 95* 101  CO2 25 28 23   GLUCOSE 966* 379* 198*  BUN 36* 32* 22*  CREATININE 1.76* 1.48* 1.18  CALCIUM 9.2 9.3 8.5*  MG 2.0  --   --   AST 18 18  --   ALT 20 18  --   ALKPHOS 164* 127*  --  BILITOT 0.7 0.7  --      Microbiology Results  Results for orders placed or performed during the hospital encounter of 03/19/19  Respiratory Panel by RT PCR (Flu A&B, Covid) - Nasopharyngeal Swab     Status: None   Collection Time: 03/19/19  1:03 PM   Specimen: Nasopharyngeal Swab  Result Value Ref Range Status   SARS Coronavirus 2 by RT PCR NEGATIVE NEGATIVE Final    Comment: (NOTE) SARS-CoV-2 target nucleic acids are NOT DETECTED. The SARS-CoV-2 RNA is generally detectable in upper respiratoy specimens during the acute phase of infection. The lowest concentration of SARS-CoV-2 viral copies this assay can detect is 131 copies/mL. A negative result does not preclude SARS-Cov-2 infection and should not be used as the sole basis for treatment or other patient management decisions. A negative result may occur with  improper specimen collection/handling, submission of specimen other than nasopharyngeal swab, presence of viral mutation(s) within the areas targeted by this  assay, and inadequate number of viral copies (<131 copies/mL). A negative result must be combined with clinical observations, patient history, and epidemiological information. The expected result is Negative. Fact Sheet for Patients:  https://www.moore.com/ Fact Sheet for Healthcare Providers:  https://www.young.biz/ This test is not yet ap proved or cleared by the [place] FDA and  has been authorized for detection and/or diagnosis of SARS-CoV-2 by FDA under an Emergency Use Authorization (EUA). This EUA will remain  in effect (meaning this test can be used) for the duration of the COVID-19 declaration under Section 564(b)(1) of the Act, 21 U.S.C. section 360bbb-3(b)(1), unless the authorization is terminated or revoked sooner.    Influenza A by PCR NEGATIVE NEGATIVE Final   Influenza B by PCR NEGATIVE NEGATIVE Final    Comment: (NOTE) The Xpert Xpress SARS-CoV-2/FLU/RSV assay is intended as an aid in  the diagnosis of influenza from Nasopharyngeal swab specimens and  should not be used as a sole basis for treatment. Nasal washings and  aspirates are unacceptable for Xpert Xpress SARS-CoV-2/FLU/RSV  testing. Fact Sheet for Patients: https://www.moore.com/ Fact Sheet for Healthcare Providers: https://www.young.biz/ This test is not yet approved or cleared by the [place] FDA and  has been authorized for detection and/or diagnosis of SARS-CoV-2 by  FDA under an Emergency Use Authorization (EUA). This EUA will remain  in effect (meaning this test can be used) for the duration of the  Covid-19 declaration under Section 564(b)(1) of the Act, 21  U.S.C. section 360bbb-3(b)(1), unless the authorization is  terminated or revoked. Performed at [facility], [address] [place], [place] [address]   MRSA PCR Screening     Status: None   Collection Time: 03/19/19 10:58 PM  Result Value Ref  Range Status   MRSA by PCR NEGATIVE NEGATIVE Final    Comment:        The GeneXpert MRSA Assay (FDA approved for NASAL specimens only), is one component of a comprehensive MRSA colonization surveillance program. It is not intended to diagnose MRSA infection nor to guide or monitor treatment for MRSA infections. Performed at [facility], [address] [place], [place] [address]     RADIOLOGY:  DG Chest Portable 1 View  Result Date: 03/19/2019 CLINICAL DATA:  Shortness of breath. EXAM: PORTABLE CHEST 1 VIEW COMPARISON:  January 07, 2019. FINDINGS: The heart size and mediastinal contours are within normal limits. Both lungs are clear. No pneumothorax or pleural effusion is noted. Left-sided pacemaker is unchanged in position. Sternotomy wires are noted.  The visualized skeletal structures are unremarkable. IMPRESSION: No active disease. Electronically Signed   By: Lupita Raider M.D.   On: 03/19/2019 11:06      Management plans discussed with the patient, and he is in agreement.  Patient refused me calling family.  CODE STATUS:     Code Status Orders  (From admission, onward)         Start     Ordered   03/19/19 1608  Full code  Continuous     03/19/19 1607        Code Status History    Date Active Date Inactive Code Status Order ID Comments User Context   01/07/2019 1742 01/09/2019 1214 Full Code [id number]  Jama Flavors, MD ED   10/15/2018 2023 10/16/2018 1715 Full Code [id number]  Shaune Pollack, MD Inpatient   08/21/2018 0726 08/22/2018 1628 Full Code [id number]  Pearletha Alfred, NP Inpatient   08/21/2018 0222 08/21/2018 0725 Full Code [id number]  Pearletha Alfred, NP ED   07/14/2018 2020 07/16/2018 1645 Full Code [id number]  Ihor Austin, MD ED   05/06/2018 0504 05/12/2018 1435 Full Code [id number]  Arnaldo Natal, MD Inpatient   09/23/2017 1515 09/25/2017 1750 Full Code [id number]  Ramonita Lab, MD Inpatient   12/26/2015 2110 12/27/2015 1954 Full Code [id number]  Altamese Dilling, MD Inpatient   Advance Care Planning Activity      TOTAL TIME TAKING CARE OF THIS PATIENT: 35 minutes.    Alford Highland M.D on 03/20/2019 at 4:48 PM  Between 7am to 6pm - Pager - [phone]  After 6pm go to [email or web address] - password [email or web address]  [facility]  CC: Primary care physician; Dorcas Carrow, DO

## 2019-03-20 NOTE — Progress Notes (Signed)
AVS provided and discussed. Home meds discussed. Follow up discussed. Patient does not have additional questions at this time. IV's removed.

## 2019-03-20 NOTE — Progress Notes (Signed)
Inpatient Diabetes Program Recommendations  AACE/ADA: New Consensus Statement on Inpatient Glycemic Control (2015)  Target Ranges:  Prepandial:   less than 140 mg/dL      Peak postprandial:   less than 180 mg/dL (1-2 hours)      Critically ill patients:  140 - 180 mg/dL   Lab Results  Component Value Date   GLUCAP 213 (H) 03/20/2019   HGBA1C 13.7 (H) 01/16/2019    Review of Glycemic Control  Diabetes history: DM1 Outpatient Diabetes medications: Levemir 40 units + Novolog 6-15 units tid meal coverage + Farxiga (added @ Dr. Gabriel Carina office visit 02/21/19) Current orders for Inpatient glycemic control:Levemir 40 units + Novolog resistant correction scale tid with meals  Inpatient Diabetes Program Recommendations:   -Decrease Novolog correction scale to sensitive tid + hs 0-5 units -Add Novolog 4 units tid meal coverage if eats 50% meals  Thank you, Nani Gasser. Kalasia Crafton, RN, MSN, CDE  Diabetes Coordinator Inpatient Glycemic Control Team Team Pager [phone] (8am-5pm) 03/20/2019 9:57 AM

## 2019-03-20 NOTE — Progress Notes (Signed)
Blood glucose improved, patient transitioned from with discontinuation of endo tool and started on high dose correction novolog SQ ACHS. Also oredered his home dose levemir.  Attending likely to adjust as well as preference of restart of farxiga

## 2019-03-28 ENCOUNTER — Telehealth: Payer: Self-pay

## 2019-03-28 DIAGNOSIS — E1365 Other specified diabetes mellitus with hyperglycemia: Secondary | ICD-10-CM | POA: Insufficient documentation

## 2019-03-28 DIAGNOSIS — IMO0002 Reserved for concepts with insufficient information to code with codable children: Secondary | ICD-10-CM | POA: Insufficient documentation

## 2019-04-01 ENCOUNTER — Encounter: Payer: Self-pay | Admitting: *Deleted

## 2019-04-02 ENCOUNTER — Ambulatory Visit: Payer: Self-pay | Admitting: Licensed Clinical Social Worker

## 2019-04-02 DIAGNOSIS — Z599 Problem related to housing and economic circumstances, unspecified: Secondary | ICD-10-CM

## 2019-04-02 DIAGNOSIS — Z598 Other problems related to housing and economic circumstances: Secondary | ICD-10-CM

## 2019-04-02 NOTE — Chronic Care Management (AMB) (Signed)
Chronic Care Management    Clinical Social Work General Note  04/02/2019 Name: Jesse Christian MRN: [id number] DOB: 19-Mar-1971  Jesse Christian is a 48 y.o. year old male who is a primary care patient of Valerie Roys, DO. The CCM was consulted to assist the patient with Financial Difficulties related to affording health care expenses.   Mr. Mendonsa was given information about Chronic Care Management services today including:  1. CCM service includes personalized support from designated clinical staff supervised by his physician, including individualized plan of care and coordination with other care providers 2. 24/7 contact phone numbers for assistance for urgent and routine care needs. 3. Service will only be billed when office clinical staff spend 20 minutes or more in a month to coordinate care. 4. Only one practitioner may furnish and bill the service in a calendar month. 5. The patient may stop CCM services at any time (effective at the end of the month) by phone call to the office staff. 6. The patient will be responsible for cost sharing (co-pay) of up to 20% of the service fee (after annual deductible is met).  Patient agreed to services and verbal consent obtained.   Review of patient status, including review of consultants reports, relevant laboratory and other test results, and collaboration with appropriate care team members and the patient's provider was performed as part of comprehensive patient evaluation and provision of chronic care management services.    SDOH (Social Determinants of Health) screening performed today. See Care Plan Entry related to challenges with: Financial Strain   Outpatient Encounter Medications as of 04/02/2019  Medication Sig  . aspirin 81 MG chewable tablet Chew 81 mg by mouth daily.   Jesse Christian atorvastatin (LIPITOR) 80 MG tablet Take 80 mg by mouth daily.  . Cholecalciferol (VITAMIN D-1000 MAX ST) 25 MCG (1000 UT) tablet Take 5,000 mcg by mouth  daily.  . furosemide (LASIX) 40 MG tablet Take 1 tablet (40 mg total) by mouth daily as needed.  . insulin aspart (NOVOLOG) 100 UNIT/ML injection Inject 6-15 Units into the skin 3 (three) times daily before meals.   Jesse Christian LEVEMIR FLEXTOUCH 100 UNIT/ML Pen Inject 40 Units into the skin daily.  . magnesium oxide (MAG-OX) 400 MG tablet Take 400 mg by mouth daily.  . methadone (DOLOPHINE) 10 MG tablet Take 130 mg by mouth daily.   . metoCLOPramide (REGLAN) 10 MG tablet Take 5 mg by mouth every 6 (six) hours as needed for nausea/vomiting.  . metoprolol succinate (TOPROL-XL) 25 MG 24 hr tablet Take 25 mg by mouth daily.   No facility-administered encounter medications on file as of 04/02/2019.    Goals Addressed    . SW- "I need financial support at this time." (pt-stated)       Current Barriers:  . Financial constraints related to managing health care expenses  . Limited social support . ADL IADL limitations . Social Isolation . Lacks knowledge of community resource: disability enrollment and financial support resources that are nearby  Clinical Social Work Clinical Goal(s):  Jesse Christian Over the next 120 days, patient will work with SW to address concerns related to gaining education and resource connection on financial assistance and support.   Interventions: . Patient interviewed and appropriate assessments performed . Provided patient with information about crisis support resources and the disability process enrollment . Discussed plans with patient for ongoing care management follow up and provided patient with direct contact information for care management team . Advised patient to wait  for C3 Guide to contact him  . Collaborated with C3 Guide (community agency) re: Referral for financial assistance resource connection and possible disability enrollment  . Assisted patient/caregiver with obtaining information about health plan benefits . Provided education and assistance to client regarding  Advanced Directives. . Provided education to patient/caregiver regarding level of care options. . Patient with multiple specialists (methadone clinic, [facility] cardiology, [facility], [facility]) with health care costs adding up. Patient is interested in process of applying for disability but is actively working and is unable to afford his rent/bills if he were to leave job in order to qualify for Disability.   Patient Self Care Activities:  . Attends all scheduled provider appointments . Lacks social connections  Initial goal documentation     Follow Up Plan: SW will follow up with patient by phone over the next quarter  Eula Fried, [place], MSW, [email or web address] Phone: [phone]

## 2019-04-16 ENCOUNTER — Encounter: Payer: Self-pay | Admitting: Family Medicine

## 2019-04-16 ENCOUNTER — Telehealth: Payer: Managed Care, Other (non HMO)

## 2019-04-16 ENCOUNTER — Other Ambulatory Visit: Payer: Self-pay

## 2019-04-16 ENCOUNTER — Ambulatory Visit (INDEPENDENT_AMBULATORY_CARE_PROVIDER_SITE_OTHER): Payer: Self-pay | Admitting: Family Medicine

## 2019-04-16 DIAGNOSIS — Z20822 Contact with and (suspected) exposure to covid-19: Secondary | ICD-10-CM

## 2019-04-16 NOTE — Progress Notes (Signed)
There were no vitals taken for this visit.   Subjective:    Patient ID: Jesse Christian, male    DOB: 07-28-1970, 49 y.o.   MRN: [id number]  HPI: Jesse Christian is a 49 y.o. male  Chief Complaint  Patient presents with  . COVID exposure    Over the holidays, was around them for about 2 days   Just found out that he was exposed to COVID 12/24-26 several times. Inside with and without a mask. He started feeling sick about 3-4 days ago.  UPPER RESPIRATORY TRACT INFECTION Duration: 3-4 days Worst symptom: feeling crummy Fever: no Cough: yes Shortness of breath: no Wheezing: no Chest pain: yes Chest tightness: yes Chest congestion: no Nasal congestion: no Runny nose: yes Post nasal drip: yes Sneezing: no Sore throat: yes Swollen glands: no Sinus pressure: no Headache: yes Face pain: no Toothache: no Ear pain: no  Ear pressure: no  Eyes red/itching:no Eye drainage/crusting: no  Vomiting: no Rash: no Fatigue: yes Sick contacts: yes Strep contacts: no  Context: stable Recurrent sinusitis: no Relief with OTC cold/cough medications: no  Treatments attempted: none   Relevant past medical, surgical, family and social history reviewed and updated as indicated. Interim medical history since our last visit reviewed. Allergies and medications reviewed and updated.  Review of Systems  Constitutional: Positive for fatigue. Negative for activity change, appetite change, chills, diaphoresis, fever and unexpected weight change.  HENT: Positive for congestion and rhinorrhea. Negative for dental problem, drooling, ear discharge, ear pain, facial swelling, hearing loss, mouth sores, nosebleeds, postnasal drip, sinus pressure, sinus pain, sneezing, sore throat, tinnitus, trouble swallowing and voice change.   Respiratory: Negative.   Cardiovascular: Negative.   Psychiatric/Behavioral: Negative.     Per HPI unless specifically indicated above     Objective:    There were  no vitals taken for this visit.  Wt Readings from Last 3 Encounters:  03/20/19 153 lb 7 oz (69.6 kg)  01/23/19 160 lb (72.6 kg)  01/16/19 163 lb (73.9 kg)    Physical Exam Vitals and nursing note reviewed.  Constitutional:      General: He is not in acute distress.    Appearance: Normal appearance. He is not ill-appearing, toxic-appearing or diaphoretic.  HENT:     Head: Normocephalic and atraumatic.     Right Ear: External ear normal.     Left Ear: External ear normal.     Nose: Nose normal.     Mouth/Throat:     Mouth: Mucous membranes are moist.     Pharynx: Oropharynx is clear.  Eyes:     General: No scleral icterus.       Right eye: No discharge.        Left eye: No discharge.     Conjunctiva/sclera: Conjunctivae normal.     Pupils: Pupils are equal, round, and reactive to light.  Pulmonary:     Effort: Pulmonary effort is normal. No respiratory distress.     Comments: Speaking in full sentences Musculoskeletal:        General: Normal range of motion.     Cervical back: Normal range of motion.  Skin:    Coloration: Skin is not jaundiced or pale.     Findings: No bruising, erythema, lesion or rash.  Neurological:     Mental Status: He is alert and oriented to person, place, and time. Mental status is at baseline.  Psychiatric:        Mood and Affect: Mood  normal.        Behavior: Behavior normal.        Thought Content: Thought content normal.        Judgment: Judgment normal.     Results for orders placed or performed during the hospital encounter of 03/19/19  Respiratory Panel by RT PCR (Flu A&B, Covid) - Nasopharyngeal Swab   Specimen: Nasopharyngeal Swab  Result Value Ref Range   SARS Coronavirus 2 by RT PCR NEGATIVE NEGATIVE   Influenza A by PCR NEGATIVE NEGATIVE   Influenza B by PCR NEGATIVE NEGATIVE  MRSA PCR Screening  Result Value Ref Range   MRSA by PCR NEGATIVE NEGATIVE  CBC with Differential  Result Value Ref Range   WBC 6.9 4.0 - 10.5 K/uL    RBC 4.48 4.22 - 5.81 MIL/uL   Hemoglobin 12.3 (L) 13.0 - 17.0 g/dL   HCT 37.3 (L) 39.0 - 52.0 %   MCV 83.3 80.0 - 100.0 fL   MCH 27.5 26.0 - 34.0 pg   MCHC 33.0 30.0 - 36.0 g/dL   RDW 12.3 11.5 - 15.5 %   Platelets 283 150 - 400 K/uL   nRBC 0.0 0.0 - 0.2 %   Neutrophils Relative % 62 %   Neutro Abs 4.3 1.7 - 7.7 K/uL   Lymphocytes Relative 27 %   Lymphs Abs 1.9 0.7 - 4.0 K/uL   Monocytes Relative 7 %   Monocytes Absolute 0.5 0.1 - 1.0 K/uL   Eosinophils Relative 2 %   Eosinophils Absolute 0.2 0.0 - 0.5 K/uL   Basophils Relative 1 %   Basophils Absolute 0.1 0.0 - 0.1 K/uL   Immature Granulocytes 1 %   Abs Immature Granulocytes 0.05 0.00 - 0.07 K/uL  Comprehensive metabolic panel  Result Value Ref Range   Sodium 121 (L) 135 - 145 mmol/L   Potassium 6.1 (H) 3.5 - 5.1 mmol/L   Chloride 82 (L) 98 - 111 mmol/L   CO2 25 22 - 32 mmol/L   Glucose, Bld 966 (HH) 70 - 99 mg/dL   BUN 36 (H) 6 - 20 mg/dL   Creatinine, Ser 1.76 (H) 0.61 - 1.24 mg/dL   Calcium 9.2 8.9 - 10.3 mg/dL   Total Protein 8.1 6.5 - 8.1 g/dL   Albumin 4.3 3.5 - 5.0 g/dL   AST 18 15 - 41 U/L   ALT 20 0 - 44 U/L   Alkaline Phosphatase 164 (H) 38 - 126 U/L   Total Bilirubin 0.7 0.3 - 1.2 mg/dL   GFR calc non Af Amer 45 (L) >60 mL/min   GFR calc Af Amer 53 (L) >60 mL/min   Anion gap 14 5 - 15  Blood gas, venous  Result Value Ref Range   pH, Ven 7.35 7.250 - 7.430   pCO2, Ven 60 44.0 - 60.0 mmHg   pO2, Ven <31.0 (LL) 32.0 - 45.0 mmHg   Bicarbonate 33.1 (H) 20.0 - 28.0 mmol/L   Acid-Base Excess 5.6 (H) 0.0 - 2.0 mmol/L   O2 Saturation 17.3 %   Patient temperature 37.0    Collection site VEIN    Sample type VENOUS   Magnesium  Result Value Ref Range   Magnesium 2.0 1.7 - 2.4 mg/dL  Beta-hydroxybutyric acid  Result Value Ref Range   Beta-Hydroxybutyric Acid 0.46 (H) 0.05 - 0.27 mmol/L  Glucose, capillary  Result Value Ref Range   Glucose-Capillary >600 (HH) 70 - 99 mg/dL   Comment 1 Document in Chart     Glucose,  capillary  Result Value Ref Range   Glucose-Capillary 529 (HH) 70 - 99 mg/dL   Comment 1 Document in Chart   am labs cmp  Result Value Ref Range   Sodium 133 (L) 135 - 145 mmol/L   Potassium 3.4 (L) 3.5 - 5.1 mmol/L   Chloride 95 (L) 98 - 111 mmol/L   CO2 28 22 - 32 mmol/L   Glucose, Bld 379 (H) 70 - 99 mg/dL   BUN 32 (H) 6 - 20 mg/dL   Creatinine, Ser 8.75 (H) 0.61 - 1.24 mg/dL   Calcium 9.3 8.9 - 64.3 mg/dL   Total Protein 7.5 6.5 - 8.1 g/dL   Albumin 3.8 3.5 - 5.0 g/dL   AST 18 15 - 41 U/L   ALT 18 0 - 44 U/L   Alkaline Phosphatase 127 (H) 38 - 126 U/L   Total Bilirubin 0.7 0.3 - 1.2 mg/dL   GFR calc non Af Amer 56 (L) >60 mL/min   GFR calc Af Amer >60 >60 mL/min   Anion gap 10 5 - 15  Glucose, capillary  Result Value Ref Range   Glucose-Capillary 441 (H) 70 - 99 mg/dL  Glucose, capillary  Result Value Ref Range   Glucose-Capillary 361 (H) 70 - 99 mg/dL  Glucose, capillary  Result Value Ref Range   Glucose-Capillary 230 (H) 70 - 99 mg/dL  Basic metabolic panel  Result Value Ref Range   Sodium 134 (L) 135 - 145 mmol/L   Potassium 3.5 3.5 - 5.1 mmol/L   Chloride 101 98 - 111 mmol/L   CO2 23 22 - 32 mmol/L   Glucose, Bld 198 (H) 70 - 99 mg/dL   BUN 22 (H) 6 - 20 mg/dL   Creatinine, Ser 3.29 0.61 - 1.24 mg/dL   Calcium 8.5 (L) 8.9 - 10.3 mg/dL   GFR calc non Af Amer >60 >60 mL/min   GFR calc Af Amer >60 >60 mL/min   Anion gap 10 5 - 15  CBC  Result Value Ref Range   WBC 6.5 4.0 - 10.5 K/uL   RBC 3.91 (L) 4.22 - 5.81 MIL/uL   Hemoglobin 10.8 (L) 13.0 - 17.0 g/dL   HCT 51.8 (L) 84.1 - 66.0 %   MCV 82.4 80.0 - 100.0 fL   MCH 27.6 26.0 - 34.0 pg   MCHC 33.5 30.0 - 36.0 g/dL   RDW 63.0 16.0 - 10.9 %   Platelets 249 150 - 400 K/uL   nRBC 0.0 0.0 - 0.2 %  Glucose, capillary  Result Value Ref Range   Glucose-Capillary 159 (H) 70 - 99 mg/dL  Glucose, capillary  Result Value Ref Range   Glucose-Capillary 124 (H) 70 - 99 mg/dL  Glucose, capillary  Result  Value Ref Range   Glucose-Capillary 181 (H) 70 - 99 mg/dL  Basic metabolic panel  Result Value Ref Range   Sodium 137 135 - 145 mmol/L   Potassium 3.4 (L) 3.5 - 5.1 mmol/L   Chloride 98 98 - 111 mmol/L   CO2 28 22 - 32 mmol/L   Glucose, Bld 201 (H) 70 - 99 mg/dL   BUN 28 (H) 6 - 20 mg/dL   Creatinine, Ser 3.23 (H) 0.61 - 1.24 mg/dL   Calcium 8.9 8.9 - 55.7 mg/dL   GFR calc non Af Amer 59 (L) >60 mL/min   GFR calc Af Amer >60 >60 mL/min   Anion gap 11 5 - 15  Glucose, capillary  Result Value Ref Range  Glucose-Capillary 210 (H) 70 - 99 mg/dL  Glucose, capillary  Result Value Ref Range   Glucose-Capillary 164 (H) 70 - 99 mg/dL  Glucose, capillary  Result Value Ref Range   Glucose-Capillary 85 70 - 99 mg/dL  Glucose, capillary  Result Value Ref Range   Glucose-Capillary 148 (H) 70 - 99 mg/dL  Glucose, capillary  Result Value Ref Range   Glucose-Capillary 187 (H) 70 - 99 mg/dL  Glucose, capillary  Result Value Ref Range   Glucose-Capillary 182 (H) 70 - 99 mg/dL  Glucose, capillary  Result Value Ref Range   Glucose-Capillary 178 (H) 70 - 99 mg/dL  Hemoglobin K4Y  Result Value Ref Range   Hgb A1c MFr Bld 13.2 (H) 4.8 - 5.6 %   Mean Plasma Glucose 332.14 mg/dL  Glucose, capillary  Result Value Ref Range   Glucose-Capillary 213 (H) 70 - 99 mg/dL  Glucose, capillary  Result Value Ref Range   Glucose-Capillary 226 (H) 70 - 99 mg/dL  Troponin I (High Sensitivity)  Result Value Ref Range   Troponin I (High Sensitivity) 12 <18 ng/L  Troponin I (High Sensitivity)  Result Value Ref Range   Troponin I (High Sensitivity) 11 <18 ng/L      Assessment & Plan:   Problem List Items Addressed This Visit    None    Visit Diagnoses    Close exposure to COVID-19 virus    -  Primary   Will get him tested. Self-quarantine until results are back. Symptomatic care. Call with any concerns.    Relevant Orders   Novel Coronavirus, NAA ([facility])   MyChart COVID-19 home monitoring  program   Temperature monitoring       Follow up plan: Return if symptoms worsen or fail to improve.    . This visit was completed via Doximity due to the restrictions of the COVID-19 pandemic. All issues as above were discussed and addressed. Physical exam was done as above through visual confirmation on Doximity. If it was felt that the patient should be evaluated in the office, they were directed there. The patient verbally consented to this visit. . Location of the patient: home . Location of the provider: home . Those involved with this call:  . Provider: Olevia Perches, DO . CMA: Tiffany Reel, CMA . Front Desk/Registration: Adela Ports  . Time spent on call: 15 minutes with patient face to face via video conference. More than 50% of this time was spent in counseling and coordination of care. 23 minutes total spent in review of patient's record and preparation of their chart.

## 2019-04-17 ENCOUNTER — Ambulatory Visit: Payer: No Typology Code available for payment source | Attending: Internal Medicine

## 2019-04-17 DIAGNOSIS — Z20822 Contact with and (suspected) exposure to covid-19: Secondary | ICD-10-CM

## 2019-04-19 LAB — NOVEL CORONAVIRUS, NAA: SARS-CoV-2, NAA: NOT DETECTED

## 2019-05-01 ENCOUNTER — Emergency Department: Payer: No Typology Code available for payment source

## 2019-05-01 ENCOUNTER — Encounter: Payer: Self-pay | Admitting: Emergency Medicine

## 2019-05-01 ENCOUNTER — Other Ambulatory Visit: Payer: Self-pay

## 2019-05-01 ENCOUNTER — Observation Stay
Admission: EM | Admit: 2019-05-01 | Discharge: 2019-05-02 | Disposition: A | Discharge status: Home / Self Care | Payer: No Typology Code available for payment source | Attending: Internal Medicine | Admitting: Internal Medicine

## 2019-05-01 DIAGNOSIS — Z794 Long term (current) use of insulin: Secondary | ICD-10-CM | POA: Diagnosis not present

## 2019-05-01 DIAGNOSIS — E1100 Type 2 diabetes mellitus with hyperosmolarity without nonketotic hyperglycemic-hyperosmolar coma (NKHHC): Secondary | ICD-10-CM

## 2019-05-01 DIAGNOSIS — E785 Hyperlipidemia, unspecified: Secondary | ICD-10-CM | POA: Diagnosis not present

## 2019-05-01 DIAGNOSIS — IMO0002 Reserved for concepts with insufficient information to code with codable children: Secondary | ICD-10-CM | POA: Diagnosis present

## 2019-05-01 DIAGNOSIS — Z79891 Long term (current) use of opiate analgesic: Secondary | ICD-10-CM | POA: Insufficient documentation

## 2019-05-01 DIAGNOSIS — R112 Nausea with vomiting, unspecified: Secondary | ICD-10-CM

## 2019-05-01 DIAGNOSIS — R109 Unspecified abdominal pain: Secondary | ICD-10-CM | POA: Diagnosis not present

## 2019-05-01 DIAGNOSIS — E1301 Other specified diabetes mellitus with hyperosmolarity with coma: Secondary | ICD-10-CM

## 2019-05-01 DIAGNOSIS — I110 Hypertensive heart disease with heart failure: Secondary | ICD-10-CM | POA: Insufficient documentation

## 2019-05-01 DIAGNOSIS — I10 Essential (primary) hypertension: Secondary | ICD-10-CM | POA: Diagnosis present

## 2019-05-01 DIAGNOSIS — E860 Dehydration: Secondary | ICD-10-CM | POA: Insufficient documentation

## 2019-05-01 DIAGNOSIS — I5022 Chronic systolic (congestive) heart failure: Secondary | ICD-10-CM | POA: Diagnosis present

## 2019-05-01 DIAGNOSIS — N179 Acute kidney failure, unspecified: Secondary | ICD-10-CM

## 2019-05-01 DIAGNOSIS — Z87891 Personal history of nicotine dependence: Secondary | ICD-10-CM | POA: Diagnosis not present

## 2019-05-01 DIAGNOSIS — Z951 Presence of aortocoronary bypass graft: Secondary | ICD-10-CM | POA: Insufficient documentation

## 2019-05-01 DIAGNOSIS — E1365 Other specified diabetes mellitus with hyperglycemia: Secondary | ICD-10-CM | POA: Insufficient documentation

## 2019-05-01 DIAGNOSIS — R1084 Generalized abdominal pain: Secondary | ICD-10-CM | POA: Diagnosis not present

## 2019-05-01 DIAGNOSIS — Z7982 Long term (current) use of aspirin: Secondary | ICD-10-CM | POA: Diagnosis not present

## 2019-05-01 DIAGNOSIS — E1165 Type 2 diabetes mellitus with hyperglycemia: Secondary | ICD-10-CM

## 2019-05-01 DIAGNOSIS — Z79899 Other long term (current) drug therapy: Secondary | ICD-10-CM | POA: Insufficient documentation

## 2019-05-01 DIAGNOSIS — I2510 Atherosclerotic heart disease of native coronary artery without angina pectoris: Secondary | ICD-10-CM

## 2019-05-01 LAB — CBC WITH DIFFERENTIAL/PLATELET
Abs Immature Granulocytes: 0.04 10*3/uL (ref 0.00–0.07)
Basophils Absolute: 0.1 10*3/uL (ref 0.0–0.1)
Basophils Relative: 1 %
Eosinophils Absolute: 0.2 10*3/uL (ref 0.0–0.5)
Eosinophils Relative: 2 %
HCT: 41.9 % (ref 39.0–52.0)
Hemoglobin: 13.7 g/dL (ref 13.0–17.0)
Immature Granulocytes: 0 %
Lymphocytes Relative: 27 %
Lymphs Abs: 2.5 10*3/uL (ref 0.7–4.0)
MCH: 27.6 pg (ref 26.0–34.0)
MCHC: 32.7 g/dL (ref 30.0–36.0)
MCV: 84.3 fL (ref 80.0–100.0)
Monocytes Absolute: 0.5 10*3/uL (ref 0.1–1.0)
Monocytes Relative: 6 %
Neutro Abs: 5.8 10*3/uL (ref 1.7–7.7)
Neutrophils Relative %: 64 %
Platelets: 305 10*3/uL (ref 150–400)
RBC: 4.97 MIL/uL (ref 4.22–5.81)
RDW: 12.5 % (ref 11.5–15.5)
WBC: 9.2 10*3/uL (ref 4.0–10.5)
nRBC: 0 % (ref 0.0–0.2)

## 2019-05-01 LAB — TROPONIN I (HIGH SENSITIVITY)
Troponin I (High Sensitivity): 17 ng/L (ref <18)
Troponin I (High Sensitivity): 18 ng/L — ABNORMAL HIGH (ref <18)
Troponin I (High Sensitivity): 15 ng/L (ref <18)

## 2019-05-01 LAB — COMPREHENSIVE METABOLIC PANEL
ALT: 21 U/L (ref 0–44)
AST: 20 U/L (ref 15–41)
Albumin: 4.3 g/dL (ref 3.5–5.0)
Alkaline Phosphatase: 129 U/L — ABNORMAL HIGH (ref 38–126)
Anion gap: 15 (ref 5–15)
BUN: 30 mg/dL — ABNORMAL HIGH (ref 6–20)
CO2: 24 mmol/L (ref 22–32)
Calcium: 10.1 mg/dL (ref 8.9–10.3)
Chloride: 94 mmol/L — ABNORMAL LOW (ref 98–111)
Creatinine, Ser: 1.55 mg/dL — ABNORMAL HIGH (ref 0.61–1.24)
GFR calc Af Amer: >60 mL/min (ref >60)
GFR calc non Af Amer: 53 mL/min — ABNORMAL LOW (ref >60)
Glucose, Bld: 615 mg/dL (ref 70–99)
Potassium: 4.5 mmol/L (ref 3.5–5.1)
Sodium: 133 mmol/L — ABNORMAL LOW (ref 135–145)
Total Bilirubin: 1.1 mg/dL (ref 0.3–1.2)
Total Protein: 8 g/dL (ref 6.5–8.1)

## 2019-05-01 LAB — URINALYSIS, ROUTINE W REFLEX MICROSCOPIC
Bacteria, UA: NONE SEEN
Bilirubin Urine: NEGATIVE
Glucose, UA: >=500 mg/dL — AB
Ketones, ur: 20 mg/dL — AB
Leukocytes,Ua: NEGATIVE
Nitrite: NEGATIVE
Protein, ur: 100 mg/dL — AB
Specific Gravity, Urine: 1.017 (ref 1.005–1.030)
Squamous Epithelial / HPF: NONE SEEN (ref 0–5)
pH: 7 (ref 5.0–8.0)

## 2019-05-01 LAB — BASIC METABOLIC PANEL
Anion gap: 9 (ref 5–15)
BUN: 25 mg/dL — ABNORMAL HIGH (ref 6–20)
CO2: 25 mmol/L (ref 22–32)
Calcium: 9.2 mg/dL (ref 8.9–10.3)
Chloride: 100 mmol/L (ref 98–111)
Creatinine, Ser: 1.36 mg/dL — ABNORMAL HIGH (ref 0.61–1.24)
GFR calc Af Amer: >60 mL/min (ref >60)
GFR calc non Af Amer: >60 mL/min (ref >60)
Glucose, Bld: 316 mg/dL — ABNORMAL HIGH (ref 70–99)
Potassium: 4.6 mmol/L (ref 3.5–5.1)
Sodium: 134 mmol/L — ABNORMAL LOW (ref 135–145)

## 2019-05-01 LAB — GLUCOSE, CAPILLARY
Glucose-Capillary: 549 mg/dL (ref 70–99)
Glucose-Capillary: 424 mg/dL — ABNORMAL HIGH (ref 70–99)
Glucose-Capillary: 338 mg/dL — ABNORMAL HIGH (ref 70–99)
Glucose-Capillary: 214 mg/dL — ABNORMAL HIGH (ref 70–99)
Glucose-Capillary: 184 mg/dL — ABNORMAL HIGH (ref 70–99)
Glucose-Capillary: 329 mg/dL — ABNORMAL HIGH (ref 70–99)
Glucose-Capillary: 225 mg/dL — ABNORMAL HIGH (ref 70–99)

## 2019-05-01 LAB — OSMOLALITY: Osmolality: 319 mOsm/kg — ABNORMAL HIGH (ref 275–295)

## 2019-05-01 LAB — POC SARS CORONAVIRUS 2 AG -  ED: SARS Coronavirus 2 Ag: NEGATIVE

## 2019-05-01 LAB — LIPASE, BLOOD: Lipase: 34 U/L (ref 11–51)

## 2019-05-01 LAB — T4, FREE: Free T4: 0.97 ng/dL (ref 0.61–1.12)

## 2019-05-01 LAB — HEMOGLOBIN A1C
Hgb A1c MFr Bld: 12.6 % — ABNORMAL HIGH (ref 4.8–5.6)
Mean Plasma Glucose: 314.92 mg/dL

## 2019-05-01 LAB — TSH: TSH: 0.499 u[IU]/mL (ref 0.350–4.500)

## 2019-05-01 MED ORDER — INSULIN ASPART 100 UNIT/ML ~~LOC~~ SOLN
0.0000 [IU] | Freq: Three times a day (TID) | SUBCUTANEOUS | Status: DC
  Administered 2019-05-01: 2 [IU] via SUBCUTANEOUS
  Administered 2019-05-01: 7 [IU] via SUBCUTANEOUS
  Filled 2019-05-01 (×2): qty 1

## 2019-05-01 MED ORDER — ACETAMINOPHEN 325 MG PO TABS: 650.0000 mg | ORAL_TABLET | Freq: Four times a day (QID) | ORAL | Status: DC | PRN

## 2019-05-01 MED ORDER — MORPHINE SULFATE (PF) 4 MG/ML IV SOLN
INTRAVENOUS | Status: AC
  Administered 2019-05-01: 4 mg via INTRAVENOUS
  Filled 2019-05-01: qty 1

## 2019-05-01 MED ORDER — INSULIN ASPART 100 UNIT/ML ~~LOC~~ SOLN
0.0000 [IU] | SUBCUTANEOUS | Status: DC
  Administered 2019-05-02 (×2): 2 [IU] via SUBCUTANEOUS
  Administered 2019-05-02: 7 [IU] via SUBCUTANEOUS
  Filled 2019-05-01 (×3): qty 1

## 2019-05-01 MED ORDER — ASPIRIN 81 MG PO CHEW
81.0000 mg | CHEWABLE_TABLET | Freq: Every day | ORAL | Status: DC
  Administered 2019-05-01 – 2019-05-02 (×2): 81 mg via ORAL
  Filled 2019-05-01 (×2): qty 1

## 2019-05-01 MED ORDER — INSULIN REGULAR(HUMAN) IN NACL 100-0.9 UT/100ML-% IV SOLN
INTRAVENOUS | Status: DC
  Administered 2019-05-01: 12 [IU]/h via INTRAVENOUS
  Filled 2019-05-01: qty 100

## 2019-05-01 MED ORDER — ONDANSETRON HCL 4 MG/2ML IJ SOLN
INTRAMUSCULAR | Status: AC
  Administered 2019-05-01: 4 mg via INTRAVENOUS
  Filled 2019-05-01: qty 2

## 2019-05-01 MED ORDER — SODIUM CHLORIDE 0.9 % IV SOLN: INTRAVENOUS | Status: DC

## 2019-05-01 MED ORDER — ACETAMINOPHEN 650 MG RE SUPP: 650.0000 mg | Freq: Four times a day (QID) | RECTAL | Status: DC | PRN

## 2019-05-01 MED ORDER — DEXTROSE-NACL 5-0.45 % IV SOLN: INTRAVENOUS | Status: DC

## 2019-05-01 MED ORDER — SODIUM CHLORIDE 0.9 % IV BOLUS
1000.0000 mL | Freq: Once | INTRAVENOUS | Status: AC
  Administered 2019-05-01: 1000 mL via INTRAVENOUS

## 2019-05-01 MED ORDER — METOCLOPRAMIDE HCL 5 MG/ML IJ SOLN
10.0000 mg | Freq: Once | INTRAMUSCULAR | Status: AC
  Administered 2019-05-01: 10 mg via INTRAVENOUS
  Filled 2019-05-01: qty 2

## 2019-05-01 MED ORDER — INSULIN DETEMIR 100 UNIT/ML ~~LOC~~ SOLN
40.0000 [IU] | Freq: Every day | SUBCUTANEOUS | Status: DC
  Administered 2019-05-01: 40 [IU] via SUBCUTANEOUS
  Filled 2019-05-01: qty 0.4

## 2019-05-01 MED ORDER — DEXTROSE 50 % IV SOLN: 0.0000 mL | INTRAVENOUS | Status: DC | PRN

## 2019-05-01 MED ORDER — INSULIN ASPART 100 UNIT/ML ~~LOC~~ SOLN: 0.0000 [IU] | Freq: Every day | SUBCUTANEOUS | Status: DC

## 2019-05-01 MED ORDER — HYDROCODONE-ACETAMINOPHEN 5-325 MG PO TABS: 1.0000 | ORAL_TABLET | ORAL | Status: DC | PRN

## 2019-05-01 MED ORDER — INSULIN DETEMIR 100 UNIT/ML ~~LOC~~ SOLN
20.0000 [IU] | Freq: Every day | SUBCUTANEOUS | Status: DC
  Administered 2019-05-02: 20 [IU] via SUBCUTANEOUS
  Filled 2019-05-01 (×2): qty 0.2

## 2019-05-01 MED ORDER — SPIRONOLACTONE 25 MG PO TABS
12.5000 mg | ORAL_TABLET | Freq: Every day | ORAL | Status: DC
  Administered 2019-05-01 – 2019-05-02 (×2): 12.5 mg via ORAL
  Filled 2019-05-01: qty 0.5
  Filled 2019-05-01: qty 1
  Filled 2019-05-01: qty 0.5

## 2019-05-01 MED ORDER — METOPROLOL SUCCINATE ER 25 MG PO TB24
25.0000 mg | ORAL_TABLET | Freq: Every day | ORAL | Status: DC
  Administered 2019-05-01 – 2019-05-02 (×2): 25 mg via ORAL
  Filled 2019-05-01 (×2): qty 1

## 2019-05-01 MED ORDER — ONDANSETRON HCL 4 MG/2ML IJ SOLN: 4.0000 mg | Freq: Once | INTRAMUSCULAR | Status: AC

## 2019-05-01 MED ORDER — ATORVASTATIN CALCIUM 20 MG PO TABS
80.0000 mg | ORAL_TABLET | Freq: Every day | ORAL | Status: DC
  Administered 2019-05-01 – 2019-05-02 (×2): 80 mg via ORAL
  Filled 2019-05-01 (×2): qty 4

## 2019-05-01 MED ORDER — INSULIN ASPART 100 UNIT/ML ~~LOC~~ SOLN
8.0000 [IU] | Freq: Once | SUBCUTANEOUS | Status: AC
  Administered 2019-05-01: 8 [IU] via INTRAVENOUS
  Filled 2019-05-01: qty 1

## 2019-05-01 MED ORDER — LACTATED RINGERS IV SOLN: INTRAVENOUS | Status: DC

## 2019-05-01 MED ORDER — LACTATED RINGERS IV BOLUS
1000.0000 mL | Freq: Once | INTRAVENOUS | Status: AC
  Administered 2019-05-01: 1000 mL via INTRAVENOUS

## 2019-05-01 MED ORDER — INSULIN ASPART 100 UNIT/ML ~~LOC~~ SOLN: 6.0000 [IU] | Freq: Three times a day (TID) | SUBCUTANEOUS | Status: DC

## 2019-05-01 MED ORDER — HEPARIN SODIUM (PORCINE) 5000 UNIT/ML IJ SOLN
5000.0000 [IU] | Freq: Three times a day (TID) | INTRAMUSCULAR | Status: DC
  Administered 2019-05-01 – 2019-05-02 (×3): 5000 [IU] via SUBCUTANEOUS
  Filled 2019-05-01 (×4): qty 1

## 2019-05-01 MED ORDER — MORPHINE SULFATE (PF) 4 MG/ML IV SOLN: 4.0000 mg | Freq: Once | INTRAVENOUS | Status: AC

## 2019-05-01 MED ORDER — METHADONE HCL 10 MG PO TABS
130.0000 mg | ORAL_TABLET | Freq: Every day | ORAL | Status: DC
  Administered 2019-05-01 – 2019-05-02 (×2): 130 mg via ORAL
  Filled 2019-05-01 (×2): qty 13

## 2019-05-01 MED ORDER — HYDRALAZINE HCL 20 MG/ML IJ SOLN
5.0000 mg | Freq: Four times a day (QID) | INTRAMUSCULAR | Status: DC | PRN
  Filled 2019-05-01: qty 0.25

## 2019-05-01 NOTE — H&P (Signed)
History and Physical    Jesse Christian BHA:[id number] DOB: 1970/08/04 DOA: 05/01/2019   PCP: Dorcas Carrow, DO   Outpatient Specialists: [facility].  Patient coming from: Home  Chief Complaint: Abdominal pain.  HPI: Jesse Christian is a 49 y.o. male with medical history significant of DM insulin dependant type 1.5 per endocrinologist . Pt haS INUSLIN PUMP AT HOME.  Pt states it started same way all the time going on since his heart surgery a year ago. Pt has had a cabg a year ago- carsds md-dr slovac.starts with nause and foul odur eructations and constant vomitting. No h/o stomach sutgeries. Pt hs not had egd or colonoscpy. Pt does not see a kidney md. He has pmh of poorly controlled DM II with cardiac involvement , heart disease cad s/p CABG s/p AICD.   ED Course:  A/A/O and bp is high 150/98.Pt give good history about his diabetes that he has had since age of 39. He is on insulin pump but has not started yet as he does not know how to use.  Review of Systems: As per HPI otherwise 10 point review of systems negative.    Past Medical History:  Diagnosis Date  . Diabetes mellitus without complication (HCC)   . Drug abuse (HCC)   . Hyperlipidemia   . Right arm fracture 2018    Past Surgical History:  Procedure Laterality Date  . CARDIAC SURGERY    . CARDIAC SURGERY  05/2018   double bypass  . ICD IMPLANT    . LEFT HEART CATH AND CORONARY ANGIOGRAPHY Right 05/06/2018   Procedure: LEFT HEART CATH AND CORONARY ANGIOGRAPHY;  Surgeon: Laurier Nancy, MD;  Location: [facility] INVASIVE CV LAB;  Service: Cardiovascular;  Laterality: Right;     reports that he quit smoking about a year ago. His smoking use included cigarettes. He smoked 0.50 packs per day. He has never used smokeless tobacco. He reports that he does not drink alcohol or use drugs.  Allergies  Allergen Reactions  . Lyrica [Pregabalin] Other (See Comments)    Peripheral edema  . Metformin And Related  Nausea And Vomiting    Family History  Problem Relation Age of Onset  . Mental illness Mother   . Heart attack Father   . Heart disease Father   . Mental illness Sister   . Cancer Maternal Aunt        Lung cancer  . Prostate cancer Neg Hx   . Bladder Cancer Neg Hx   . Kidney cancer Neg Hx     Prior to Admission medications   Medication Sig Start Date End Date Taking? Authorizing Provider  aspirin 81 MG chewable tablet Chew 81 mg by mouth daily.    Yes [provider]  atorvastatin (LIPITOR) 80 MG tablet Take 80 mg by mouth daily. 07/30/18  Yes [provider]  Cholecalciferol (VITAMIN D-1000 MAX ST) 25 MCG (1000 UT) tablet Take 5,000 mcg by mouth daily.   Yes [provider]  furosemide (LASIX) 40 MG tablet Take 1 tablet (40 mg total) by mouth daily as needed. 03/20/19  Yes Wieting, Richard, MD  insulin aspart (NOVOLOG) 100 UNIT/ML injection Inject 6-15 Units into the skin 3 (three) times daily before meals.  10/02/18 05/01/19 Yes [provider]  LEVEMIR FLEXTOUCH 100 UNIT/ML Pen Inject 40 Units into the skin daily. 03/20/19  Yes Wieting, Richard, MD  losartan (COZAAR) 25 MG tablet Take 25 mg by mouth daily. 04/16/19  Yes  [provider]  magnesium oxide (MAG-OX) 400 MG tablet Take 400 mg by mouth daily.   Yes [provider]  methadone (DOLOPHINE) 10 MG tablet Take 130 mg by mouth daily.    Yes [provider]  metoCLOPramide (REGLAN) 10 MG tablet Take 5 mg by mouth every 6 (six) hours as needed for nausea/vomiting. 10/02/18  Yes [provider]  metoprolol succinate (TOPROL-XL) 25 MG 24 hr tablet Take 25 mg by mouth daily. 07/30/18  Yes [provider]  spironolactone (ALDACTONE) 25 MG tablet Take 12.5 mg by mouth daily. 04/16/19  Yes [provider]    Physical Exam: Vitals:   05/01/19 0627 05/01/19 0630 05/01/19 0700 05/01/19 0900  BP: (!) 181/104 (!) 179/94 (!) 184/96 (!) 170/84  Pulse: 82 78 86  82  Resp: 17   16  Temp: 98.5 F (36.9 C)     TempSrc: Oral     SpO2: 100% 100% 100% 100%  Weight:      Height:        Constitutional: NAD, calm, comfortable Vitals:   05/01/19 0627 05/01/19 0630 05/01/19 0700 05/01/19 0900  BP: (!) 181/104 (!) 179/94 (!) 184/96 (!) 170/84  Pulse: 82 78 86 82  Resp: 17   16  Temp: 98.5 F (36.9 C)     TempSrc: Oral     SpO2: 100% 100% 100% 100%  Weight:      Height:       Eyes: PERRL, lids and conjunctivae normal ENMT: Mucous membranes are moist. Posterior pharynx clear of any exudate or lesions.Normal dentition.  Neck: normal, supple, no masses, no thyromegaly Respiratory: clear to auscultation bilaterally, no wheezing, no crackles. Normal respiratory effort. No accessory muscle use.  Cardiovascular: Regular rate and rhythm, no murmurs / rubs / gallops. No extremity edema. 2+ pedal pulses. No carotid bruits.  Abdomen: no tenderness, no masses palpated. No hepatosplenomegaly. Bowel sounds positive.  Musculoskeletal: no clubbing / cyanosis. No joint deformity upper and lower extremities. Good ROM, no contractures. Normal muscle tone.  Skin: no rashes, lesions, ulcers. No induration Neurologic: CN 2-12 grossly intact.moving all four ext. Psychiatric: Normal judgment and insight. Alert and oriented x 3. Normal mood.   Labs on Admission: I have personally reviewed following labs and imaging studies  CBC: Recent Labs  Lab 05/01/19 0625  WBC 9.2  NEUTROABS 5.8  HGB 13.7  HCT 41.9  MCV 84.3  PLT 305   Basic Metabolic Panel: Recent Labs  Lab 05/01/19 0625  NA 133*  K 4.5  CL 94*  CO2 24  GLUCOSE 615*  BUN 30*  CREATININE 1.55*  CALCIUM 10.1   GFR: Estimated Creatinine Clearance: 61.2 mL/min (A) (by C-G formula based on SCr of 1.55 mg/dL (H)). Liver Function Tests: Recent Labs  Lab 05/01/19 0625  AST 20  ALT 21  ALKPHOS 129*  BILITOT 1.1  PROT 8.0  ALBUMIN 4.3   Recent Labs  Lab 05/01/19 0625  LIPASE 34   No  results for input(s): AMMONIA in the last 168 hours. Coagulation Profile: No results for input(s): INR, PROTIME in the last 168 hours. Cardiac Enzymes: No results for input(s): CKTOTAL, CKMB, CKMBINDEX, TROPONINI in the last 168 hours. BNP (last 3 results) No results for input(s): PROBNP in the last 8760 hours. HbA1C: No results for input(s): HGBA1C in the last 72 hours. CBG: Recent Labs  Lab 05/01/19 0623 05/01/19 0824 05/01/19 0943  GLUCAP 549* 424* 338*   Lipid Profile: No results for input(s): CHOL,  HDL, LDLCALC, TRIG, CHOLHDL, LDLDIRECT in the last 72 hours. Thyroid Function Tests: No results for input(s): TSH, T4TOTAL, FREET4, T3FREE, THYROIDAB in the last 72 hours. Anemia Panel: No results for input(s): VITAMINB12, FOLATE, FERRITIN, TIBC, IRON, RETICCTPCT in the last 72 hours. Urine analysis:    Component Value Date/Time   COLORURINE COLORLESS (A) 05/01/2019 0828   APPEARANCEUR CLEAR (A) 05/01/2019 0828   APPEARANCEUR Clear 01/16/2019 1444   LABSPEC 1.017 05/01/2019 0828   LABSPEC 1.022 11/27/2012 0630   PHURINE 7.0 05/01/2019 0828   GLUCOSEU >=500 (A) 05/01/2019 0828   GLUCOSEU 150 mg/dL 11/27/2012 0630   HGBUR SMALL (A) 05/01/2019 0828   BILIRUBINUR NEGATIVE 05/01/2019 0828   BILIRUBINUR Negative 01/16/2019 1444   BILIRUBINUR Negative 11/27/2012 0630   KETONESUR 20 (A) 05/01/2019 0828   PROTEINUR 100 (A) 05/01/2019 0828   UROBILINOGEN 0.2 10/06/2014 1718   NITRITE NEGATIVE 05/01/2019 0828   LEUKOCYTESUR NEGATIVE 05/01/2019 0828   LEUKOCYTESUR Negative 11/27/2012 0630   Sepsis Labs: @LABRCNTIP (procalcitonin:4,lacticidven:4) )No results found for this or any previous visit (from the past 240 hour(s)).   Radiological Exams on Admission: DG Chest Portable 1 View  Result Date: 05/01/2019 CLINICAL DATA:  Shortness of breath EXAM: PORTABLE CHEST 1 VIEW COMPARISON:  03/19/2019 FINDINGS: Cardiac shadow is stable. Defibrillator is again seen. Postsurgical changes  are again noted. The lungs are well aerated bilaterally. No focal infiltrate or sizable effusion is seen. IMPRESSION: No active disease. Electronically Signed   By: Inez Catalina M.D.   On: 05/01/2019 08:38    Assessment/Plan Principal Problem:   Abdominal pain Active Problems:   Diabetes, type 1.5, uncontrolled, managed as type 1 (HCC)   CAD (coronary atherosclerotic disease)   AKI (acute kidney injury) ([place])   Essential hypertension   Chronic systolic CHF (congestive heart failure) (HCC)   Hyperlipidemia  Abdominal Pain: Pt is admitted for abdominal pain that has been gong on since his heart surgery per pt report. Once his creatinine improves with hydration I will do a cta abd/ pelvis for vascular evaluation. We will consult gi for eval as well suspect pt has gastroparesis as his dm is poorly controlled.  usg is pending.   DM 1.5: Per pt his endocrinologist diagnosed him with dm 1.5 and is on insulin pump that he just got and does not know how to use. His last hba1c in December was over 13. We will repeat today. We will continue his  levemir and ssi.  CAD/Hyperlipidemia: Pt had left heart cath in Jan 2020  AKI: Pt's creatinine is elevated today to 1.5 today and previous one is 1.18 the one prior to that si 1.41. I suspect pt has some degree of ckd due to his poorly controlled dm, pt has never seen nephrology in past. Pt started on ns at 75 to hydrate pt and will follow closely for volume overload. BNP today. Last echo was in may 2020     DVT prophylaxis:Lovenox. Code Status:  FULL  Family Communication:Jackie wagner [phone]. Disposition Plan:Inpatient Consults called:None Admission status: Inpatient.   Para Skeans MD [facility] If 7PM-7AM, please contact night-coverage [email or web address] Password [facility] 05/01/2019, 10:55 AM

## 2019-05-01 NOTE — Consult Note (Signed)
[name] , MD [address], [address], [place], [place], [address] [address], [address], [place], [place], [address] Phone: [phone]  Fax: [phone]  Consultation  Referring Provider:     Dr Allena Katz  Primary Care Physician:  Dorcas Carrow, DO Primary Gastroenterologist:  [facility]         Reason for Consultation:     Gastroparesis  Date of Admission:  05/01/2019 Date of Consultation:  05/01/2019         HPI:   Jesse Christian is a 49 y.o. male is known to [facility] gastro[facility]ology and has been seen last on 02/12/2019 for gastroparesis.  He has been counseled about the types of food fat content usage of Reglan.  At some point they wanted to perform an EGD and colonoscopy for colon cancer screening and ruling out gastric outlet obstruction.   Presented with nausea vomiting and abdominal pain.  Does not meet criteria for DKA or hyperosmolar state.  On admission his blood glucose level was 549 mg/dL. Hemoglobin 13.7 g and creatinine 1.55.  Serum osmolality of 319.  Troponins have been ordered but yet to be collected.  Glucose this morning is 184.   I have been consulted for nausea vomiting and abdominal pain likely secondary to gastroparesis.  Ultrasound right upper quadrant demonstrated gallbladder sludge otherwise was normal.  Transaminases and total bilirubin normal.  Since coming into the hospital no nausea,vomiting or abdominal pain, keeping liquids down. No complaints at this time.    Past Medical History:  Diagnosis Date  . Diabetes mellitus without complication (HCC)   . Drug abuse (HCC)   . Hyperlipidemia   . Right arm fracture 2018    Past Surgical History:  Procedure Laterality Date  . CARDIAC SURGERY    . CARDIAC SURGERY  05/2018   double bypass  . ICD IMPLANT    . LEFT HEART CATH AND CORONARY ANGIOGRAPHY Right 05/06/2018   Procedure: LEFT HEART CATH AND CORONARY ANGIOGRAPHY;  Surgeon: Laurier Nancy, MD;  Location: [facility] INVASIVE CV LAB;  Service: Cardiovascular;   Laterality: Right;    Prior to Admission medications   Medication Sig Start Date End Date Taking? Authorizing Provider  aspirin 81 MG chewable tablet Chew 81 mg by mouth daily.    Yes [provider]  atorvastatin (LIPITOR) 80 MG tablet Take 80 mg by mouth daily. 07/30/18  Yes [provider]  Cholecalciferol (VITAMIN D-1000 MAX ST) 25 MCG (1000 UT) tablet Take 5,000 mcg by mouth daily.   Yes [provider]  furosemide (LASIX) 40 MG tablet Take 1 tablet (40 mg total) by mouth daily as needed. 03/20/19  Yes Wieting, Richard, MD  insulin aspart (NOVOLOG) 100 UNIT/ML injection Inject 6-15 Units into the skin 3 (three) times daily before meals.  10/02/18 05/01/19 Yes [provider]  LEVEMIR FLEXTOUCH 100 UNIT/ML Pen Inject 40 Units into the skin daily. 03/20/19  Yes Wieting, Richard, MD  losartan (COZAAR) 25 MG tablet Take 25 mg by mouth daily. 04/16/19  Yes [provider]  magnesium oxide (MAG-OX) 400 MG tablet Take 400 mg by mouth daily.   Yes [provider]  methadone (DOLOPHINE) 10 MG tablet Take 130 mg by mouth daily.    Yes [provider]  metoCLOPramide (REGLAN) 10 MG tablet Take 5 mg by mouth every 6 (six) hours as needed for nausea/vomiting. 10/02/18  Yes [provider]  metoprolol succinate (TOPROL-XL) 25 MG 24 hr tablet Take 25 mg by mouth  daily. 07/30/18  Yes [provider]  spironolactone (ALDACTONE) 25 MG tablet Take 12.5 mg by mouth daily. 04/16/19  Yes [provider]    Family History  Problem Relation Age of Onset  . Mental illness Mother   . Heart attack Father   . Heart disease Father   . Mental illness Sister   . Cancer Maternal Aunt        Lung cancer  . Prostate cancer Neg Hx   . Bladder Cancer Neg Hx   . Kidney cancer Neg Hx      Social History   Tobacco Use  . Smoking status: Former Smoker    Packs/day: 0.50    Types: Cigarettes    Quit date: 05/05/2018    Years since  quitting: 0.9  . Smokeless tobacco: Never Used  Substance Use Topics  . Alcohol use: No  . Drug use: No    Allergies as of 05/01/2019 - Review Complete 05/01/2019  Allergen Reaction Noted  . Lyrica [pregabalin] Other (See Comments) 01/07/2016  . Metformin and related Nausea And Vomiting 07/23/2018    Review of Systems:    All systems reviewed and negative except where noted in HPI.   Physical Exam:  Vital signs in last 24 hours: Temp:  [98.5 F (36.9 C)] 98.5 F (36.9 C) (01/21 0627) Pulse Rate:  [70-86] 78 (01/21 1430) Resp:  [16-17] 16 (01/21 1143) BP: (107-184)/(68-104) 123/68 (01/21 1430) SpO2:  [97 %-100 %] 98 % (01/21 1430) Weight:  [72.6 kg] 72.6 kg (01/21 0607)   General:   Pleasant, cooperative in NAD Head:  Normocephalic and atraumatic. Eyes:   No icterus.   Conjunctiva pink. PERRLA. Ears:  Normal auditory acuity. Neck:  Supple; no masses or thyroidomegaly Lungs: Respirations even and unlabored. Lungs clear to auscultation bilaterally.   No wheezes, crackles, or rhonchi.  Heart:  Regular rate and rhythm;  Without murmur, clicks, rubs or gallops Abdomen:  Soft, nondistended, nontender. Normal bowel sounds. No appreciable masses or hepatomegaly.  No rebound or guarding.  Neurologic:  Alert and oriented x3;  grossly normal neurologically. Skin:  Intact without significant lesions or rashes. Cervical Nodes:  No significant cervical adenopathy. Psych:  Alert and cooperative. Normal affect.  LAB RESULTS: Recent Labs    05/01/19 0625  WBC 9.2  HGB 13.7  HCT 41.9  PLT 305   BMET Recent Labs    05/01/19 0625  NA 133*  K 4.5  CL 94*  CO2 24  GLUCOSE 615*  BUN 30*  CREATININE 1.55*  CALCIUM 10.1   LFT Recent Labs    05/01/19 0625  PROT 8.0  ALBUMIN 4.3  AST 20  ALT 21  ALKPHOS 129*  BILITOT 1.1   PT/INR No results for input(s): LABPROT, INR in the last 72 hours.  STUDIES: DG Chest Portable 1 View  Result Date: 05/01/2019 CLINICAL DATA:   Shortness of breath EXAM: PORTABLE CHEST 1 VIEW COMPARISON:  03/19/2019 FINDINGS: Cardiac shadow is stable. Defibrillator is again seen. Postsurgical changes are again noted. The lungs are well aerated bilaterally. No focal infiltrate or sizable effusion is seen. IMPRESSION: No active disease. Electronically Signed   By: Inez Catalina M.D.   On: 05/01/2019 08:38   US ABDOMEN LIMITED RUQ  Result Date: 05/01/2019 CLINICAL DATA:  Nausea vomiting for 2-3 days EXAM: ULTRASOUND ABDOMEN LIMITED RIGHT UPPER QUADRANT COMPARISON:  07/14/2018 FINDINGS: Gallbladder: Sludge and/or small stones layer in the gallbladder. No signs of gallbladder wall thickening, pericholecystic fluid or reported  sonographic Murphy's. Common bile duct: Diameter: 5 mm Liver: No focal lesion identified. Within normal limits in parenchymal echogenicity. Portal vein is patent on color Doppler imaging with normal direction of blood flow towards the liver. Other: None. IMPRESSION: 1. Gallbladder sludge, otherwise unremarkable right upper quadrant ultrasound. Electronically Signed   By: Donzetta Kohut M.D.   On: 05/01/2019 10:59      Impression / Plan:   AZTLAN COLL is a 49 y.o. y/o male with a history of diabetic gastroparesis and follows up at [facility] here with hyperglycemia, nausea,vomiting and abdominal pain all of which have resolved after his blood sugar levels have improved. History suggestive of gastroparesis.   Plan  1. Advance diet to a gastroparesis diet  2. He already has an appointment with [facility] to follow up and they plan EGD+ colonoscopy  3. No indication for him to have inpatient EGD at this time.   I will sign off.  Please call me if any further GI concerns or questions.  We would like to thank you for the opportunity to participate in the care of Jesse Christian.    Thank you for involving me in the care of this patient.      LOS: 0 days   [name], MD  05/01/2019, 2:54 PM

## 2019-05-01 NOTE — ED Provider Notes (Signed)
-----------------------------------------   7:57 AM on 05/01/2019 -----------------------------------------  Blood work does not show any evidence of DKA.  No anion gap.  Renal function roughly at baseline.  Will make sure osmolality is exceedingly high.  Patient stating now that he has been out of his methadone for 3 days and I suspect that is a large component of his discomfort vomiting is that he is actually withdrawing.  Will reorder his home methadone.  ----------------------------------------- 8:45 AM on 05/01/2019 -----------------------------------------  Osmolality is increasing just one-point away from meeting criteria for HHS.  Patient still feeling very nauseated.  No white count or signs of infectious process.  States he has been told that he may have gastroparesis and is presenting almost as if he has.  We will add additional antiemetics.  Given his hyperglycemia not tolerating p.o. and metabolic derangements will place on insulin drip give additional IV fluids and discuss with hospitalist for admission.   Willy Eddy, MD 05/01/19 [phone]

## 2019-05-01 NOTE — Progress Notes (Signed)
Inpatient Diabetes Program Recommendations  AACE/ADA: New Consensus Statement on Inpatient Glycemic Control   Target Ranges:  Prepandial:   less than 140 mg/dL      Peak postprandial:   less than 180 mg/dL (1-2 hours)      Critically ill patients:  140 - 180 mg/dL   Results for ELDRIDGE, MARCOTT (MRN [id number]) as of 05/01/2019 09:38  Ref. Range 05/01/2019 06:23 05/01/2019 08:24  Glucose-Capillary Latest Ref Range: 70 - 99 mg/dL 680 (HH) 321 (H)  Results for GEROGE, GILLIAM (MRN [id number]) as of 05/01/2019 09:38  Ref. Range 05/01/2019 06:25  Glucose Latest Ref Range: 70 - 99 mg/dL [facility])   Results for HART, HAAS (MRN [id number]) as of 05/01/2019 09:38  Ref. Range 03/20/2019 05:30  Hemoglobin A1C Latest Ref Range: 4.8 - 5.6 % 13.2 (H)   Review of Glycemic Control  Diabetes history: LADA (treated as DM1) Outpatient Diabetes medications: Levemir 40 units daily, Novolog 6-15 units TID with meals Current orders for Inpatient glycemic control: IV insulin drip  Inpatient Diabetes Program Recommendations:   Transition from IV to SQ insulin: Once MD is ready to transition to IV insulin, please consider ordering Levemir 30 units Q24H, CBGs Q4H, Novolog 0-9 units Q4H, and if ordered a diet please order Novolog 3 units TID with meals for meal coverage if patient eats at least 50% of meals.  NOTE: In reviewing chart, noted patient sees Dr. Tedd Sias (Endocrinologist) and last seen her on 03/28/19. Patient has LADA and is treated as DM1. Per office note on 03/28/19 by Dr. Tedd Sias patient's last A1C was 13.2% on 03/20/19 and is prescribed:   Take Levemir 40 units nightly   Take NovoLog before eating as follows Take 3 units of NovoLog if your sugar is 70-149 Take 4 unit of NovoLog if your sugar is 150-200 Take 6 units of NovoLog if your sugar is 201-250 Take 8 units of NovoLog if your sugar is 251-300 Take 10 units of NovoLog if your sugar is 301-350 Take 12 units of NovoLog if your sugar is  351-400 Take 15 units of NovoLog if your sugar is over 401  Take NovoLog if not eating and sugar is over 175 AND if you have not taken NovoLog in the last 3 hrs Take 1 unit of NovoLog if your sugar is 150-200 Take 2 units of NovoLog if your sugar is 201-250 Take 3 units of NovoLog if your sugar is 251-300 Take 4 units of NovoLog if your sugar is 301-350 Take 5 units of NovoLog if your sugar is 351-400 Take 6 units of NovoLog if your sugar is over 401  Also noted in office note on 03/28/19 that patient was prescribed Dexcom G6 CGM and T-slim insulin pump was discussed. Per telephone note on 04/21/19 patient had called to let Dr. Tedd Sias know that he received T-slim pump and was instructed to set up appointment for training. Spoke to patient over the phone and he reports that he has NOT started the insulin pump yet. Patient reports that he is using Levemir 40 units QHS and Novolog TID with meals based on glucose and if eating. Patient states that his glucose has been running high at home over the past several weeks and he notes that when it gets real high it makes his stomach issues worse. Discussed DKA and how the acidosis can cause nausea and vomiting. Also explained that hyperglycemia can lead to gastroparesis and make it worse. Patient interrupted our conversation to say that  the doctor was in the room and asked that I call back at a later time to talk with him further. Will continue to follow along and will plan to follow up with patient later today or tomorrow.  Thanks, Barnie Alderman, RN, MSN, CDE Diabetes Coordinator Inpatient Diabetes Program [phone] (Team Pager from 8am to 5pm)

## 2019-05-01 NOTE — ED Provider Notes (Addendum)
[facility] Emergency Department Provider Note  ____________________________________________   First MD Initiated Contact with Patient 05/01/19 [phone]     (approximate)  I have reviewed the triage vital signs and the nursing notes.   HISTORY  Chief Complaint Abdominal Pain    HPI Jesse Christian is a 49 y.o. male with below list of previous medical conditions including diabetes mellitus and previous episodes of diabetic ketoacidosis presents to the emergency department secondary to 2-day history of generalized abdominal pain nausea & vomiting          Past Medical History:  Diagnosis Date   Diabetes mellitus without complication (HCC)    Drug abuse (HCC)    Hyperlipidemia    Right arm fracture 2018    Patient Active Problem List   Diagnosis Date Noted   Hyperosmolar coma due to secondary diabetes (HCC) 03/20/2019   Hyponatremia    Essential hypertension    Chronic systolic CHF (congestive heart failure) (HCC)    Uncontrolled type 2 DM with hyperosmolar nonketotic hyperglycemia (HCC) 03/19/2019   AKI (acute kidney injury) (HCC)    Hyperglycemia 01/07/2019   Type 2 diabetes mellitus with hyperosmolar nonketotic hyperglycemia (HCC) 10/15/2018   Acute CHF (HCC) 08/21/2018   Chest pain 08/21/2018   Acute on chronic combined systolic and diastolic CHF (congestive heart failure) (HCC) 08/21/2018   Acute CHF (congestive heart failure) (HCC) 08/21/2018   Hyperglycemia without ketosis 07/14/2018   Malnutrition of moderate degree 05/07/2018   Acute systolic heart failure (HCC) 05/06/2018   Foot infection 09/23/2017   Benign prostatic hyperplasia with nocturia 06/26/2016   Anxiety disorder due to medical condition 06/22/2015   Insomnia 06/22/2015   Hep C w/o coma, chronic (HCC) 06/10/2015   Benign hypertensive renal disease 06/08/2015   History of drug abuse in remission (HCC) 06/08/2015   Diabetes mellitus with  hyperglycemia, with long-term current use of insulin (HCC)    Hyperlipidemia     Past Surgical History:  Procedure Laterality Date   CARDIAC SURGERY     CARDIAC SURGERY  05/2018   double bypass   ICD IMPLANT     LEFT HEART CATH AND CORONARY ANGIOGRAPHY Right 05/06/2018   Procedure: LEFT HEART CATH AND CORONARY ANGIOGRAPHY;  Surgeon: Laurier Nancy, MD;  Location: [facility] INVASIVE CV LAB;  Service: Cardiovascular;  Laterality: Right;    Prior to Admission medications   Medication Sig Start Date End Date Taking? Authorizing Provider  aspirin 81 MG chewable tablet Chew 81 mg by mouth daily.     [provider]  atorvastatin (LIPITOR) 80 MG tablet Take 80 mg by mouth daily. 07/30/18   [provider]  Cholecalciferol (VITAMIN D-1000 MAX ST) 25 MCG (1000 UT) tablet Take 5,000 mcg by mouth daily.    [provider]  furosemide (LASIX) 40 MG tablet Take 1 tablet (40 mg total) by mouth daily as needed. 03/20/19   Alford Highland, MD  insulin aspart (NOVOLOG) 100 UNIT/ML injection Inject 6-15 Units into the skin 3 (three) times daily before meals.  10/02/18 03/19/19  [provider]  LEVEMIR FLEXTOUCH 100 UNIT/ML Pen Inject 40 Units into the skin daily. 03/20/19   Alford Highland, MD  magnesium oxide (MAG-OX) 400 MG tablet Take 400 mg by mouth daily.    [provider]  methadone (DOLOPHINE) 10 MG tablet Take 130 mg by mouth daily.     [provider]  metoCLOPramide (REGLAN) 10 MG tablet Take 5 mg by mouth every 6 (six)  hours as needed for nausea/vomiting. 10/02/18   [provider]  metoprolol succinate (TOPROL-XL) 25 MG 24 hr tablet Take 25 mg by mouth daily. 07/30/18   [provider]    Allergies Lyrica [pregabalin] and Metformin and related  Family History  Problem Relation Age of Onset   Mental illness Mother    Heart attack Father    Heart disease Father    Mental illness Sister    Cancer Maternal Aunt         Lung cancer   Prostate cancer Neg Hx    Bladder Cancer Neg Hx    Kidney cancer Neg Hx     Social History Social History   Tobacco Use   Smoking status: Former Smoker    Packs/day: 0.50    Types: Cigarettes    Quit date: 05/05/2018    Years since quitting: 0.9   Smokeless tobacco: Never Used  Substance Use Topics   Alcohol use: No   Drug use: No    Review of Systems Constitutional: No fever/chills Eyes: No visual changes. ENT: No sore throat. Cardiovascular: Denies chest pain. Respiratory: Denies shortness of breath. Gastrointestinal: Positive for abdominal pain, nausea and vomiting Genitourinary: Negative for dysuria. Musculoskeletal: Negative for neck pain.  Negative for back pain. Integumentary: Negative for rash. Neurological: Negative for headaches, focal weakness or numbness.   ____________________________________________   PHYSICAL EXAM:  VITAL SIGNS: ED Triage Vitals  Enc Vitals Group     BP 05/01/19 0627 (!) 181/104     Pulse Rate 05/01/19 0627 82     Resp 05/01/19 0627 17     Temp 05/01/19 0627 98.5 F (36.9 C)     Temp Source 05/01/19 0627 Oral     SpO2 05/01/19 0627 100 %     Weight 05/01/19 0607 72.6 kg (160 lb)     Height 05/01/19 0607 1.829 m (6')     Head Circumference --      Peak Flow --      Pain Score 05/01/19 0606 8     Pain Loc --      Pain Edu? --      Excl. in GC? --     Constitutional: Alert and oriented.  Apparent discomfort Eyes: Conjunctivae are normal.  Mouth/Throat: Patient is wearing a mask. Neck: No stridor.  No meningeal signs.   Cardiovascular: Normal rate, regular rhythm. Good peripheral circulation. Grossly normal heart sounds. Respiratory: Normal respiratory effort.  No retractions. Gastrointestinal: Soft and nontender. No distention.  Musculoskeletal: No lower extremity tenderness nor edema. No gross deformities of extremities. Neurologic:  Normal speech and language. No gross focal neurologic deficits  are appreciated.  Skin:  Skin is warm, dry and intact. Psychiatric: Mood and affect are normal. Speech and behavior are normal.  ____________________________________________   LABS (all labs ordered are listed, but only abnormal results are displayed)  Labs Reviewed  GLUCOSE, CAPILLARY - Abnormal; Notable for the following components:      Result Value   Glucose-Capillary 549 (*)    All other components within normal limits  CBC WITH DIFFERENTIAL/PLATELET  COMPREHENSIVE METABOLIC PANEL  LIPASE, BLOOD  CBG MONITORING, ED     .Critical Care Performed by: Darci Current, MD Authorized by: Darci Current, MD   Critical care provider statement:    Critical care time (minutes):  30   Critical care time was exclusive of:  Separately billable procedures and treating other patients   Critical care was necessary to treat or prevent  imminent or life-threatening deterioration of the following conditions:  Endocrine crisis   Critical care was time spent personally by me on the following activities:  Development of treatment plan with patient or surrogate, discussions with consultants, evaluation of patient's response to treatment, examination of patient, obtaining history from patient or surrogate, ordering and performing treatments and interventions, ordering and review of laboratory studies, ordering and review of radiographic studies, pulse oximetry, re-evaluation of patient's condition and review of old charts     ____________________________________________   [place] / MDM / [place] / ED COURSE  As part of my medical decision making, I reviewed the following data within the electronic MEDICAL RECORD NUMBER  49 year old male presented with above-stated history and physical exam a differential diagnosis including but not limited to diabetic ketoacidosis versus hyperosmolar nonketotic hyperglycemia.  Patient given 2 L IV normal saline 4 mg of IV morphine and 4 mg  of IV Zofran.  Laboratory data pending at this time patient's care transferred to Dr. Quentin Cornwall.       ____________________________________________  FINAL CLINICAL IMPRESSION(S) / ED DIAGNOSES  Final diagnoses:  Intractable nausea and vomiting  N&V (nausea and vomiting)  Hyperglycemia due to diabetes mellitus (HCC)  Dehydration     MEDICATIONS GIVEN DURING THIS VISIT:  Medications  ondansetron (ZOFRAN) injection 4 mg (has no administration in time range)  morphine 4 MG/ML injection 4 mg (has no administration in time range)  sodium chloride 0.9 % bolus 1,000 mL (has no administration in time range)  sodium chloride 0.9 % bolus 1,000 mL (has no administration in time range)     ED Discharge Orders    None      *Please note:  KEATYN JAWAD was evaluated in Emergency Department on 05/01/2019 for the symptoms described in the history of present illness. He was evaluated in the context of the global COVID-19 pandemic, which necessitated consideration that the patient might be at risk for infection with the SARS-CoV-2 virus that causes COVID-19. Institutional protocols and algorithms that pertain to the evaluation of patients at risk for COVID-19 are in a state of rapid change based on information released by regulatory bodies including the CDC and federal and state organizations. These policies and algorithms were followed during the patient's care in the ED.  Some ED evaluations and interventions may be delayed as a result of limited staffing during the pandemic.*  Note:  This document was prepared using Dragon voice recognition software and may include unintentional dictation errors.   Gregor Hams, MD 05/02/19 6270    Gregor Hams, MD 05/16/19 0130

## 2019-05-01 NOTE — ED Triage Notes (Signed)
Patient ambulatory to triage with steady gait, without difficulty or distress noted; pt reports generalized abd pain with N/V x 2 days; pt diabetic

## 2019-05-02 DIAGNOSIS — R1084 Generalized abdominal pain: Secondary | ICD-10-CM | POA: Diagnosis not present

## 2019-05-02 LAB — COMPREHENSIVE METABOLIC PANEL
ALT: 15 U/L (ref 0–44)
AST: 15 U/L (ref 15–41)
Albumin: 3.7 g/dL (ref 3.5–5.0)
Alkaline Phosphatase: 93 U/L (ref 38–126)
Anion gap: 9 (ref 5–15)
BUN: 25 mg/dL — ABNORMAL HIGH (ref 6–20)
CO2: 25 mmol/L (ref 22–32)
Calcium: 9.3 mg/dL (ref 8.9–10.3)
Chloride: 104 mmol/L (ref 98–111)
Creatinine, Ser: 1.31 mg/dL — ABNORMAL HIGH (ref 0.61–1.24)
GFR calc Af Amer: >60 mL/min (ref >60)
GFR calc non Af Amer: >60 mL/min (ref >60)
Glucose, Bld: 89 mg/dL (ref 70–99)
Potassium: 4.3 mmol/L (ref 3.5–5.1)
Sodium: 138 mmol/L (ref 135–145)
Total Bilirubin: 1 mg/dL (ref 0.3–1.2)
Total Protein: 7.2 g/dL (ref 6.5–8.1)

## 2019-05-02 LAB — GLUCOSE, CAPILLARY
Glucose-Capillary: 164 mg/dL — ABNORMAL HIGH (ref 70–99)
Glucose-Capillary: 186 mg/dL — ABNORMAL HIGH (ref 70–99)
Glucose-Capillary: 192 mg/dL — ABNORMAL HIGH (ref 70–99)
Glucose-Capillary: 315 mg/dL — ABNORMAL HIGH (ref 70–99)
Glucose-Capillary: 84 mg/dL (ref 70–99)

## 2019-05-02 MED ORDER — LACTATED RINGERS IV SOLN: INTRAVENOUS | Status: DC

## 2019-05-02 NOTE — Progress Notes (Signed)
Patient discharged home per MD order. All discharge instructions given and all questions answered. 

## 2019-05-02 NOTE — Progress Notes (Signed)
Initial Nutrition Assessment  DOCUMENTATION CODES:   Not applicable  INTERVENTION:  -Glucerna Shake po BID, each supplement provides 220 kcal and 10 grams of protein  -Education   NUTRITION DIAGNOSIS:   Food and nutrition related knowledge deficit related to limited prior education as evidenced by other (comment)(A1C 13.2%; gastroparesis).    GOAL:   Other (Comment)(patient will adhere to dietary recommendations, improving A1c)   MONITOR:   PO intake, Weight trends, Supplement acceptance, Labs, I & O's  REASON FOR ASSESSMENT:   Malnutrition Screening Tool    ASSESSMENT:  RD working remotely.  49 year old male with past medical history of IDDM, CAD s/p CABG x 2 (02/20),s/p AICD (01/20), HLD, and h/o drug abuse who presented to ED with abdominal pain, nausea and vomiting.  Per chart, GI consulted for suspected gastroparesis. Pt is followed by [facility] gastroenterology, last seen on 11/4 for gastroparesis where he received counseling regarding types of fat content of food and usage of Reglan. Per GI, diet advanced to gastroparesis diet, pt has scheduled appointment with [facility] for EGD and colonoscopy, no indications for inpatient EGD at this time.   Patient with poorly controlled DM, A1c in December 2020 of 13.2%. Per notes, pt followed by endocrinologist,  Dr. Gabriel Carina.  Patient recently received T-slim insulin pump on 1/11 that he is not yet using; patient was instructed to set up appointment for training upon pump arrival.  Spoke with patient via phone this morning, he reports feeling much better today. He recalls eating almost all of eggs, sausage and biscuit with gravy for breakfast, denies any abdominal discomfort s/p po intake. Patient reports ususal good appetite/intake at home. He recalls overall consuming healthy diet, stated that he refrains from red meats and high fat foods except for cheese which he stated I eat a lot of. Patient reports intermittent GI symptoms, describing  them as nasty burps and severe nausea. He stated symptoms had improved over the past couple of months, until current episode.   RD educated on the effects of high blood sugar, encouraged small frequent meals that are lower in fat and fiber, avoiding spicy, fried/greasy foods, and discussed the benefits of  talking a walk after meals to aid with digestion as well as improving blood sugar. RD has provided educational handout for patient for review. Patient aware that handout attached to discharge instructions.   Current wt 72.6 kg (159.72 lbs) Weight history reviewed, stable over the past 10 months.  Medications reviewed and include: SS novolog, Levemir 20 units daily, Methadone 130 mg tablet daily  Labs: CBGs 192,84,164,186 x 24 hrs, BUN 25 (H), Cr 1.31 (H)   NUTRITION - FOCUSED PHYSICAL EXAM: Unable to complete at this time.   Diet Order:   Diet Order            Diet Carb Modified Fluid consistency: Thin; Room service appropriate? Yes  Diet effective now              EDUCATION NEEDS:   Education needs have been addressed  Skin:  Skin Assessment: Reviewed RN Assessment  Last BM:  1/21  Height:   Ht Readings from Last 1 Encounters:  05/01/19 6' (1.829 m)    Weight:   Wt Readings from Last 1 Encounters:  05/02/19 72.6 kg    Ideal Body Weight:  80.9 kg  BMI:  Body mass index is 21.71 kg/m.  Estimated Nutritional Needs:   Kcal:  2000-2200  Protein:  100-110  Fluid:  >/= 2  L/day   Lars Masson, RD, LDN Clinical Nutrition [facility] Telephone [phone] After Hours/Weekend Pager: [phone]

## 2019-05-02 NOTE — Discharge Summary (Signed)
Physician Discharge Summary  Jesse Christian VCB:[id number] DOB: 1971/03/11 DOA: 05/01/2019  PCP: Dorcas Carrow, DO  Admit date: 05/01/2019 Discharge date: 05/02/2019  Time spent: 20 minutes  Recommendations for Outpatient Follow-up:  1. F/U with pcp for hospital follow up.  Follow-up with your gastroenterologist as scheduled at [facility].    Discharge Diagnoses:  Principal Problem:   Abdominal pain Active Problems:   Diabetes, type 1.5, uncontrolled, managed as type 1 (HCC)   CAD (coronary atherosclerotic disease)   AKI (acute kidney injury) (HCC)   Essential hypertension   Chronic systolic CHF (congestive heart failure) (HCC)   Hyperlipidemia  Abdominal pain: -rec abd pain suspect due to gastroparesis from poorly controlle dm 1.5 -pt is stable is able to take po and can go home . -He is to f/u with [facility] as he has and egd and c-scopy scheduled.  DM1.5: Cont home regimen of insulin regimen and cont f/u with pcp for optimizing blood glucose control.  Cont levemir and novolog.   CAD/ CHF/AICD: Pt to cont his asa 81/ lipitor 80/ metoprolol 25 / aldactone 25 mg.   Pt states he has not smoked since last year.   Discharge Condition: Good  Diet recommendation: Carb consistent.  Filed Weights   05/01/19 0607 05/02/19 0500  Weight: 72.6 kg 72.6 kg    History of present illness:  Jesse Christian is a 49 y.o. male with medical history significant of DM insulin dependant type 1.5 per endocrinologist . Pt haS INUSLIN PUMP AT HOME.  Pt states it started same way all the time going on since his heart surgery a year ago. Pt has had a cabg a year ago- [facility] md-dr slovac.starts with nause and foul odur eructations and constant vomitting. No h/o stomach sutgeries. Pt hs not had egd or colonoscpy. Pt does not see a kidney md. He has pmh of poorly controlled DM II with cardiac involvement , heart disease cad s/p CABG s/p AICD.  A/A/O and bp is high 150/98.Pt give good history  about his diabetes that he has had since age of 66. He is on insulin pump but has not started yet as he does not know how to use.  Hospital Course:  Since admission pt was hydrated cautiously and made npo initially  ,then diet  Advanced. Gi consulted and no need for inpateint scope. Pt has been tolerating diet well and has GI appt [facility]. abd pain is resolved.   Procedures: None Consultations: GI Dr.Anna.  Discharge Exam: Vitals:   05/02/19 0055 05/02/19 0742  BP: 139/87 129/87  Pulse: 67 76  Resp: 17 16  Temp: 98.4 F (36.9 C) 98 F (36.7 C)  SpO2: 99% 100%   Eyes: PERRL, lids and conjunctivae normal ENMT: Mucous membranes are moist. Posterior pharynx clear of any exudate or lesions.Normal dentition.  Neck: normal, supple, no masses, no thyromegaly Respiratory: clear to auscultation bilaterally, no wheezing, no crackles. Normal respiratory effort. No accessory muscle use.  Cardiovascular: Regular rate and rhythm, no murmurs / rubs / gallops. No extremity edema. 2+ pedal pulses. No carotid bruits.  Abdomen: no tenderness, no masses palpated. No hepatosplenomegaly. Bowel sounds positive.  Musculoskeletal: no clubbing / cyanosis. No joint deformity upper and lower extremities. Good ROM, no contractures. Normal muscle tone.  Skin: no rashes, lesions, ulcers. No induration Neurologic: CN 2-12 grossly intact.moving all four ext. Psychiatric: Normal judgment and insight. Alert and oriented x 3. Normal mood.   Discharge Instructions F/U with all followup  appt with pcp and GI.  Discharge Instructions    (HEART FAILURE PATIENTS) Call MD:  Anytime you have any of the following symptoms: 1) 3 pound weight gain in 24 hours or 5 pounds in 1 week 2) shortness of breath, with or without a dry hacking cough 3) swelling in the hands, feet or stomach 4) if you have to sleep on extra pillows at night in order to breathe.   Complete by: As directed    Call MD for:  difficulty breathing, headache or  visual disturbances   Complete by: As directed    Call MD for:  extreme fatigue   Complete by: As directed    Call MD for:  hives   Complete by: As directed    Call MD for:  persistant dizziness or light-headedness   Complete by: As directed    Call MD for:  persistant nausea and vomiting   Complete by: As directed    Call MD for:  redness, tenderness, or signs of infection (pain, swelling, redness, odor or green/yellow discharge around incision site)   Complete by: As directed    Call MD for:  severe uncontrolled pain   Complete by: As directed    Call MD for:  temperature >100.4   Complete by: As directed    Diet Carb Modified   Complete by: As directed    Gastroparesis diet   Discharge instructions   Complete by: As directed    F/U with pcp in one to two weeks for hospital followup.   Increase activity slowly   Complete by: As directed      Allergies as of 05/02/2019      Reactions   Lyrica [pregabalin] Other (See Comments)   Peripheral edema   Metformin And Related Nausea And Vomiting      Medication List    TAKE these medications   aspirin 81 MG chewable tablet Chew 81 mg by mouth daily.   atorvastatin 80 MG tablet Commonly known as: LIPITOR Take 80 mg by mouth daily.   furosemide 40 MG tablet Commonly known as: LASIX Take 1 tablet (40 mg total) by mouth daily as needed.   Levemir FlexTouch 100 UNIT/ML Pen Generic drug: Insulin Detemir Inject 40 Units into the skin daily.   losartan 25 MG tablet Commonly known as: COZAAR Take 25 mg by mouth daily.   magnesium oxide 400 MG tablet Commonly known as: MAG-OX Take 400 mg by mouth daily.   methadone 10 MG tablet Commonly known as: DOLOPHINE Take 130 mg by mouth daily.   metoCLOPramide 10 MG tablet Commonly known as: REGLAN Take 5 mg by mouth every 6 (six) hours as needed for nausea/vomiting.   metoprolol succinate 25 MG 24 hr tablet Commonly known as: TOPROL-XL Take 25 mg by mouth daily.   NovoLOG  100 UNIT/ML injection Generic drug: insulin aspart Inject 6-15 Units into the skin 3 (three) times daily before meals.   spironolactone 25 MG tablet Commonly known as: ALDACTONE Take 12.5 mg by mouth daily.   Vitamin D-1000 Max St 25 MCG (1000 UT) tablet Generic drug: Cholecalciferol Take 5,000 mcg by mouth daily.      Allergies  Allergen Reactions  . Lyrica [Pregabalin] Other (See Comments)    Peripheral edema  . Metformin And Related Nausea And Vomiting   Follow-up Information    Park Liter P, DO Follow up in 1 week(s).   Specialty: Family Medicine Contact information: [address] [place] [address] [phone]  The results of significant diagnostics from this hospitalization (including imaging, microbiology, ancillary and laboratory) are listed below for reference.    Significant Diagnostic Studies: DG Chest Portable 1 View  Result Date: 05/01/2019 CLINICAL DATA:  Shortness of breath EXAM: PORTABLE CHEST 1 VIEW COMPARISON:  03/19/2019 FINDINGS: Cardiac shadow is stable. Defibrillator is again seen. Postsurgical changes are again noted. The lungs are well aerated bilaterally. No focal infiltrate or sizable effusion is seen. IMPRESSION: No active disease. Electronically Signed   By: Alcide Clever M.D.   On: 05/01/2019 08:38   US ABDOMEN LIMITED RUQ  Result Date: 05/01/2019 CLINICAL DATA:  Nausea vomiting for 2-3 days EXAM: ULTRASOUND ABDOMEN LIMITED RIGHT UPPER QUADRANT COMPARISON:  07/14/2018 FINDINGS: Gallbladder: Sludge and/or small stones layer in the gallbladder. No signs of gallbladder wall thickening, pericholecystic fluid or reported sonographic Murphy's. Common bile duct: Diameter: 5 mm Liver: No focal lesion identified. Within normal limits in parenchymal echogenicity. Portal vein is patent on color Doppler imaging with normal direction of blood flow towards the liver. Other: None. IMPRESSION: 1. Gallbladder sludge, otherwise unremarkable right upper  quadrant ultrasound. Electronically Signed   By: Donzetta Kohut M.D.   On: 05/01/2019 10:59    Microbiology: No results found for this or any previous visit (from the past 240 hour(s)).   Labs: Basic Metabolic Panel: Recent Labs  Lab 05/01/19 0625 05/01/19 1845 05/02/19 0449  NA 133* 134* 138  K 4.5 4.6 4.3  CL 94* 100 104  CO2 24 25 25   GLUCOSE 615* 316* 89  BUN 30* 25* 25*  CREATININE 1.55* 1.36* 1.31*  CALCIUM 10.1 9.2 9.3   Liver Function Tests: Recent Labs  Lab 05/01/19 0625 05/02/19 0449  AST 20 15  ALT 21 15  ALKPHOS 129* 93  BILITOT 1.1 1.0  PROT 8.0 7.2  ALBUMIN 4.3 3.7   Recent Labs  Lab 05/01/19 0625  LIPASE 34   No results for input(s): AMMONIA in the last 168 hours. CBC: Recent Labs  Lab 05/01/19 0625  WBC 9.2  NEUTROABS 5.8  HGB 13.7  HCT 41.9  MCV 84.3  PLT 305   Cardiac Enzymes: No results for input(s): CKTOTAL, CKMB, CKMBINDEX, TROPONINI in the last 168 hours. BNP: BNP (last 3 results) Recent Labs    05/05/18 2252 05/10/18 0451 08/20/18 1814  BNP 2,486.0* 590.0* 1,168.0*    ProBNP (last 3 results) No results for input(s): PROBNP in the last 8760 hours.  CBG: Recent Labs  Lab 05/01/19 2359 05/02/19 0056 05/02/19 0448 05/02/19 0743 05/02/19 1121  GLUCAP 186* 164* 84 192* 315*    Signed:  05/04/19 MD.  [facility] 05/02/2019, 12:26 PM

## 2019-05-02 NOTE — Discharge Instructions (Signed)
Gastroparesis Nutrition Therapy Gastroparesis means that your stomach empties very slowly. This happens when the nerves to your stomach are damaged or do not work properly. This can cause bloating, stomach discomfort or pain, feeling full after eating only a small amount of food, nausea, or vomiting. If you have diabetes in addition to gastroparesis, it is important to control your blood glucose. This will help the stomach empty.  Tips Following these tips may help your stomach empty faster: Eat small, frequent meals (4 to 6 times per day). Do not eat solid foods that are high in fat and do not add too much fat to foods. High-fat solid foods may delay the emptying of your stomach. Liquids that contain fat, such as milkshakes, may be tolerated and can provide needed calories. Do not eat foods high in fiber. Do not take fiber supplements or fiber bulking agents for constipation. Do not eat foods that increase acid reflux: Acidic, spicy, fried and greasy foods Caffeine Mint Do not drink alcohol or smoke Do not drink carbonated beverages, as they increase bloating. Chew foods well before swallowing. Solid foods in the stomach do not empty well. If you have difficulty tolerating solid foods, ground foods may be better. If symptoms are severe, semi-solid foods or liquids may need to be your main food sources. Choose liquid nutritional supplements that have less than or equal to 2 grams fiber per serving. Sit upright while eating and sit upright or walk after meals. Do not lie down for 3 to 4 hours after eating to avoid reflux or regurgitation. If you wish to nap during the day, nap first and then eat. Drinking fluids at meals can take up room in your stomach, and you might not get enough calories. At every meal, first eat a grain food and a protein food or dairy product if your body can tolerate it. Drink fluids with calories. It may be better to delay fluids until after the meal and drink more  between meals.  Food Group Foods Recommended Grains Choose grain foods with less than 2 grams of fiber per serving; these will be made with white flour Crackers: saltines or graham crackers Cold cereal: puffed rice Cream of rice or wheat Grits (fine ground) Gluten free low fiber foods Pretzels White bread, toasted White rice, cook until very soft  Protein Foods Lean meat and poultry: well-cooked, very tender, moist, and chopped fine Fish: tuna, salmon, or white fish Egg whites, scrambled Peanut butter (limit to 1 tablespoon at a time)  Dairy Milk*, drink 2% if tolerated to get more nutrients or lactose-free 2% milk Fortified non-dairy milks: almond, cashew, coconut, or rice (be aware th at these options are not good sources of protein so you will need to eat an additional protein food) Fortified pea milk or soymilk (may cause gas and bloating for some) Instant breakfast* (pre-made lactose-free is sold in bottles) Milkshakes* (try blending in  to  cup canned fruit) Ice cream* (low-fat may be tolerated better; use in mil kshakes to increase calories) Frozen yogurtYogurt* Puddings and custard* Sherbet Liquid nutritional supplements with less than or equal to 2 grams fiber per 1 cup serving *Use lactose -free varieties to reduce gas and bloating  Vegetables Canned and well-cooked vegetables without seeds, skins or hulls Carrots, cooked Mashed potatoes (white, red or yellow) Sweet potato  Fruit Canned, soft and well-cooked fruits without seeds, skins or membranes Applesauce Banana, mashed may be tolerated better Diced peaches/pears fruit cups in juice Melon, very soft, cut  into small pieces Fruit nectar juices  Oils When possible choose oils rather than solid fats Canola or olive oil Margarine  Other Clear soup Gelatin Popsicles

## 2019-05-05 ENCOUNTER — Encounter: Payer: Self-pay | Admitting: Family Medicine

## 2019-05-05 ENCOUNTER — Ambulatory Visit (INDEPENDENT_AMBULATORY_CARE_PROVIDER_SITE_OTHER): Payer: No Typology Code available for payment source | Admitting: Family Medicine

## 2019-05-05 ENCOUNTER — Other Ambulatory Visit: Payer: Self-pay

## 2019-05-05 ENCOUNTER — Telehealth: Payer: Self-pay

## 2019-05-05 DIAGNOSIS — K3184 Gastroparesis: Secondary | ICD-10-CM

## 2019-05-05 DIAGNOSIS — R1084 Generalized abdominal pain: Secondary | ICD-10-CM | POA: Diagnosis not present

## 2019-05-05 DIAGNOSIS — E1365 Other specified diabetes mellitus with hyperglycemia: Secondary | ICD-10-CM | POA: Diagnosis not present

## 2019-05-05 DIAGNOSIS — IMO0002 Reserved for concepts with insufficient information to code with codable children: Secondary | ICD-10-CM

## 2019-05-05 NOTE — Progress Notes (Signed)
Ht 6' (1.829 m)   Wt 160 lb (72.6 kg)   BMI 21.70 kg/m    Subjective:    Patient ID: Jesse Christian, male    DOB: 1970/07/02, 49 y.o.   MRN: [id number]  HPI: Jesse Christian is a 49 y.o. male  Chief Complaint  Patient presents with  . Hospitalization Follow-up   Transition of Care Hospital Follow up.   Hospital/Facility: [facility] D/C Physician: Dr. Posey Pronto D/C Date: 05/02/19  Records Requested: 05/05/19 Records Received: 05/05/19 Records Reviewed: 05/05/19  Diagnoses on Discharge: Abdominal pain, DM 1.5  Date of interactive Contact within 48 hours of discharge: 05/05/19 Contact was through: phone  Date of 7 day or 14 day face-to-face visit:  05/05/19  within 7 days   Outpatient Encounter Medications as of 05/05/2019  Medication Sig  . aspirin 81 MG chewable tablet Chew 81 mg by mouth daily.   Marland Kitchen atorvastatin (LIPITOR) 80 MG tablet Take 80 mg by mouth daily.  . Cholecalciferol (VITAMIN D-1000 MAX ST) 25 MCG (1000 UT) tablet Take 5,000 mcg by mouth daily.  . furosemide (LASIX) 40 MG tablet Take 1 tablet (40 mg total) by mouth daily as needed.  Marland Kitchen LEVEMIR FLEXTOUCH 100 UNIT/ML Pen Inject 40 Units into the skin daily.  Marland Kitchen losartan (COZAAR) 25 MG tablet Take 25 mg by mouth daily.  . magnesium oxide (MAG-OX) 400 MG tablet Take 400 mg by mouth daily.  . methadone (DOLOPHINE) 10 MG tablet Take 130 mg by mouth daily.   . metoCLOPramide (REGLAN) 10 MG tablet Take 5 mg by mouth every 6 (six) hours as needed for nausea/vomiting.  . metoprolol succinate (TOPROL-XL) 25 MG 24 hr tablet Take 25 mg by mouth daily.  Marland Kitchen spironolactone (ALDACTONE) 25 MG tablet Take 12.5 mg by mouth daily.  . insulin aspart (NOVOLOG) 100 UNIT/ML injection Inject 6-15 Units into the skin 3 (three) times daily before meals.    No facility-administered encounter medications on file as of 05/05/2019.  Per Hospitalist: "History of present illness:  Jesse Christian a 49 y.o.malewith medical history significant ofDM  insulin dependant type 1.5 per endocrinologist . Pt haS Jesse Christian.  Pt states it started same way all the time going on since his heart surgery a year ago. Pt has had a cabg a year ago- [facility] md-dr slovac.starts with nause and foul odur eructations and constant vomitting. No h/o stomach sutgeries. Pt hs not had egd or colonoscpy. Pt doesnot see a kidney md. He has pmh of poorly controlled DM II with cardiac involvement , heart disease cad s/p CABG s/p AICD. A/A/O and bp is high 150/98.Pt give good history about his diabetes that he has had since age of 40. He is on insulin pump but has not started yet as he does not know how to use.  Hospital Course:  Since admission pt was hydrated cautiously and made npo initially  ,then diet  Advanced. Gi consulted and no need for inpateint scope. Pt has been tolerating diet well and has GI appt [facility]. abd pain is resolved."  Diagnostic Tests Reviewed:  PORTABLE CHEST 1 VIEW  COMPARISON:  03/19/2019  FINDINGS: Cardiac shadow is stable. Defibrillator is again seen. Postsurgical changes are again noted. The lungs are well aerated bilaterally. No focal infiltrate or sizable effusion is seen.  IMPRESSION: No active disease.  EXAM: ULTRASOUND ABDOMEN LIMITED RIGHT UPPER QUADRANT  COMPARISON:  07/14/2018  FINDINGS: Gallbladder:  Sludge and/or small stones layer in the gallbladder. No signs  of gallbladder wall thickening, pericholecystic fluid or reported sonographic Murphy's.  Common bile duct:  Diameter: 5 mm  Liver:  No focal lesion identified. Within normal limits in parenchymal echogenicity. Portal vein is patent on color Doppler imaging with normal direction of blood flow towards the liver.  Other: None.  IMPRESSION: 1. Gallbladder sludge, otherwise unremarkable right upper quadrant ultrasound.  Disposition: Home  Consults: GI  Discharge Instructions:  Follow up here and with [facility]  Disease/illness  Education:  Discussed today  Home Health/Community Services Discussions/Referrals: N/A  Establishment or re-establishment of referral orders for community resources: N/A  Discussion with other health care providers: None  Assessment and Support of treatment regimen adherence: [place] with: Patient  Education for self-management, independent living, and ADLs: Discussed today  Since getting out of the hospital. Jesse Christian has been feeling better. He got his insulin pump. He notes that his sugars are up and down. He is scheduled to see endocrine to discuss how to use the insulin pump later this week. He notes that his stomach is doing well. He is able to eat. No nausea or vomiting. He is not scheduled to see [facility] just yet, but is planning on calling them tomorrow. No other concerns or complaints at this time.   Relevant past medical, surgical, family and social history reviewed and updated as indicated. Interim medical history since our last visit reviewed. Allergies and medications reviewed and updated.  Review of Systems  Constitutional: Negative.   Respiratory: Negative.   Cardiovascular: Negative.   Gastrointestinal: Negative.   Genitourinary: Negative.   Psychiatric/Behavioral: Negative.     Per HPI unless specifically indicated above     Objective:    Ht 6' (1.829 m)   Wt 160 lb (72.6 kg)   BMI 21.70 kg/m   Wt Readings from Last 3 Encounters:  05/05/19 160 lb (72.6 kg)  05/02/19 160 lb 0.9 oz (72.6 kg)  03/20/19 153 lb 7 oz (69.6 kg)    Physical Exam Vitals and nursing note reviewed.  Constitutional:      General: He is not in acute distress.    Appearance: Normal appearance. He is not ill-appearing, toxic-appearing or diaphoretic.  HENT:     Head: Normocephalic and atraumatic.     Right Ear: External ear normal.     Left Ear: External ear normal.     Nose: Nose normal.     Mouth/Throat:     Mouth: Mucous membranes are moist.     Pharynx:  Oropharynx is clear.  Eyes:     General: No scleral icterus.       Right eye: No discharge.        Left eye: No discharge.     Conjunctiva/sclera: Conjunctivae normal.     Pupils: Pupils are equal, round, and reactive to light.  Pulmonary:     Effort: Pulmonary effort is normal. No respiratory distress.     Comments: Speaking in full sentences Musculoskeletal:        General: Normal range of motion.     Cervical back: Normal range of motion.  Skin:    Coloration: Skin is not jaundiced or pale.     Findings: No bruising, erythema, lesion or rash.  Neurological:     Mental Status: He is alert and oriented to person, place, and time. Mental status is at baseline.  Psychiatric:        Mood and Affect: Mood normal.        Behavior:  Behavior normal.        Thought Content: Thought content normal.        Judgment: Judgment normal.     Results for orders placed or performed during the hospital encounter of 05/01/19  CBC with Differential  Result Value Ref Range   WBC 9.2 4.0 - 10.5 K/uL   RBC 4.97 4.22 - 5.81 MIL/uL   Hemoglobin 13.7 13.0 - 17.0 g/dL   HCT 41.9 39.0 - 52.0 %   MCV 84.3 80.0 - 100.0 fL   MCH 27.6 26.0 - 34.0 pg   MCHC 32.7 30.0 - 36.0 g/dL   RDW 12.5 11.5 - 15.5 %   Platelets 305 150 - 400 K/uL   nRBC 0.0 0.0 - 0.2 %   Neutrophils Relative % 64 %   Neutro Abs 5.8 1.7 - 7.7 K/uL   Lymphocytes Relative 27 %   Lymphs Abs 2.5 0.7 - 4.0 K/uL   Monocytes Relative 6 %   Monocytes Absolute 0.5 0.1 - 1.0 K/uL   Eosinophils Relative 2 %   Eosinophils Absolute 0.2 0.0 - 0.5 K/uL   Basophils Relative 1 %   Basophils Absolute 0.1 0.0 - 0.1 K/uL   Immature Granulocytes 0 %   Abs Immature Granulocytes 0.04 0.00 - 0.07 K/uL  Comprehensive metabolic panel  Result Value Ref Range   Sodium 133 (L) 135 - 145 mmol/L   Potassium 4.5 3.5 - 5.1 mmol/L   Chloride 94 (L) 98 - 111 mmol/L   CO2 24 22 - 32 mmol/L   Glucose, Bld 615 (HH) 70 - 99 mg/dL   BUN 30 (H) 6 - 20 mg/dL    Creatinine, Ser 1.55 (H) 0.61 - 1.24 mg/dL   Calcium 10.1 8.9 - 10.3 mg/dL   Total Protein 8.0 6.5 - 8.1 g/dL   Albumin 4.3 3.5 - 5.0 g/dL   AST 20 15 - 41 U/L   ALT 21 0 - 44 U/L   Alkaline Phosphatase 129 (H) 38 - 126 U/L   Total Bilirubin 1.1 0.3 - 1.2 mg/dL   GFR calc non Af Amer 53 (L) >60 mL/min   GFR calc Af Amer >60 >60 mL/min   Anion gap 15 5 - 15  Lipase, blood  Result Value Ref Range   Lipase 34 11 - 51 U/L  Glucose, capillary  Result Value Ref Range   Glucose-Capillary 549 (HH) 70 - 99 mg/dL   Comment 1 Notify RN   Osmolality  Result Value Ref Range   Osmolality 319 (H) 275 - 295 mOsm/kg  Urinalysis, Routine w reflex microscopic  Result Value Ref Range   Color, Urine COLORLESS (A) YELLOW   APPearance CLEAR (A) CLEAR   Specific Gravity, Urine 1.017 1.005 - 1.030   pH 7.0 5.0 - 8.0   Glucose, UA >=500 (A) NEGATIVE mg/dL   Hgb urine dipstick SMALL (A) NEGATIVE   Bilirubin Urine NEGATIVE NEGATIVE   Ketones, ur 20 (A) NEGATIVE mg/dL   Protein, ur 100 (A) NEGATIVE mg/dL   Nitrite NEGATIVE NEGATIVE   Leukocytes,Ua NEGATIVE NEGATIVE   RBC / HPF 11-20 0 - 5 RBC/hpf   WBC, UA 0-5 0 - 5 WBC/hpf   Bacteria, UA NONE SEEN NONE SEEN   Squamous Epithelial / LPF NONE SEEN 0 - 5  Glucose, capillary  Result Value Ref Range   Glucose-Capillary 424 (H) 70 - 99 mg/dL  Glucose, capillary  Result Value Ref Range   Glucose-Capillary 338 (H) 70 - 99 mg/dL  Hemoglobin  A1c  Result Value Ref Range   Hgb A1c MFr Bld 12.6 (H) 4.8 - 5.6 %   Mean Plasma Glucose 314.92 mg/dL  TSH  Result Value Ref Range   TSH 0.499 0.350 - 4.500 uIU/mL  T4, free  Result Value Ref Range   Free T4 0.97 0.61 - 1.12 ng/dL  Glucose, capillary  Result Value Ref Range   Glucose-Capillary 214 (H) 70 - 99 mg/dL  Glucose, capillary  Result Value Ref Range   Glucose-Capillary 184 (H) 70 - 99 mg/dL   Comment 1 Document in Chart    Comment 2 Call MD NNP PA CNM   Glucose, capillary  Result Value Ref Range     Glucose-Capillary 329 (H) 70 - 99 mg/dL  Basic metabolic panel  Result Value Ref Range   Sodium 134 (L) 135 - 145 mmol/L   Potassium 4.6 3.5 - 5.1 mmol/L   Chloride 100 98 - 111 mmol/L   CO2 25 22 - 32 mmol/L   Glucose, Bld 316 (H) 70 - 99 mg/dL   BUN 25 (H) 6 - 20 mg/dL   Creatinine, Ser 1.36 (H) 0.61 - 1.24 mg/dL   Calcium 9.2 8.9 - 10.3 mg/dL   GFR calc non Af Amer >60 >60 mL/min   GFR calc Af Amer >60 >60 mL/min   Anion gap 9 5 - 15  Comprehensive metabolic panel  Result Value Ref Range   Sodium 138 135 - 145 mmol/L   Potassium 4.3 3.5 - 5.1 mmol/L   Chloride 104 98 - 111 mmol/L   CO2 25 22 - 32 mmol/L   Glucose, Bld 89 70 - 99 mg/dL   BUN 25 (H) 6 - 20 mg/dL   Creatinine, Ser 1.31 (H) 0.61 - 1.24 mg/dL   Calcium 9.3 8.9 - 10.3 mg/dL   Total Protein 7.2 6.5 - 8.1 g/dL   Albumin 3.7 3.5 - 5.0 g/dL   AST 15 15 - 41 U/L   ALT 15 0 - 44 U/L   Alkaline Phosphatase 93 38 - 126 U/L   Total Bilirubin 1.0 0.3 - 1.2 mg/dL   GFR calc non Af Amer >60 >60 mL/min   GFR calc Af Amer >60 >60 mL/min   Anion gap 9 5 - 15  Glucose, capillary  Result Value Ref Range   Glucose-Capillary 225 (H) 70 - 99 mg/dL  Glucose, capillary  Result Value Ref Range   Glucose-Capillary 186 (H) 70 - 99 mg/dL  Glucose, capillary  Result Value Ref Range   Glucose-Capillary 164 (H) 70 - 99 mg/dL  Glucose, capillary  Result Value Ref Range   Glucose-Capillary 84 70 - 99 mg/dL  Glucose, capillary  Result Value Ref Range   Glucose-Capillary 192 (H) 70 - 99 mg/dL  Glucose, capillary  Result Value Ref Range   Glucose-Capillary 315 (H) 70 - 99 mg/dL  POC SARS Coronavirus 2 Ag-ED - Nasal Swab (BD Veritor Kit)  Result Value Ref Range   SARS Coronavirus 2 Ag Negative Negative  Troponin I (High Sensitivity)  Result Value Ref Range   Troponin I (High Sensitivity) 17 <18 ng/L  Troponin I (High Sensitivity)  Result Value Ref Range   Troponin I (High Sensitivity) 18 (H) <18 ng/L  Troponin I (High  Sensitivity)  Result Value Ref Range   Troponin I (High Sensitivity) 15 <18 ng/L      Assessment & Plan:   Problem List Items Addressed This Visit      Digestive   Gastroparesis  No vomiting now. Due to follow up with GI. He will call if he needs a new referral. Continue small meals throughout the day. Continue medication. Call with any concerns.         Endocrine   Diabetes, type 1.5, uncontrolled, managed as type 1 ([place])    Doing better since getting out of the hospital. About to start using his pump. He is looking forward to this and being under better control. Continue to follow with endocrinology. Call with any concerns. Continue to monitor.         Other   Abdominal pain    Due to gastroparesis. Has resolved with IV medication. Follow up with GI as needed. Call with any concerns. Continue to monitor.           Follow up plan: Return As scheuled.    . This visit was completed via D[employer] due to the restrictions of the COVID-19 pandemic. All issues as above were discussed and addressed. Physical exam was done as above through visual confirmation on Doximity. If it was felt that the patient should be evaluated in the office, they were directed there. The patient verbally consented to this visit. . Location of the patient: work . Location of the provider: work . Those involved with this call:  . Provider: Park Liter, DO . CMA: Lesle Chris, [place] . Front Desk/Registration: Don Perking  . Time spent on call: 25 minutes with patient face to face via video conference. More than 50% of this time was spent in counseling and coordination of care. 40 minutes total spent in review of patient's record and preparation of their chart.

## 2019-05-05 NOTE — Telephone Encounter (Signed)
Transition Care Management Follow-up Telephone Call  Date of discharge and from where: /  How have you been since you were released from the hospital? "no I finally got my insulin pu/mp, just waiting to go to the classes and then I think everything will be much better after that" [facility]   Any questions or concerns? No   Items Reviewed:  Did the pt receive and understand the discharge instructions provided? Yes   Medications obtained and verified? Yes   Any new allergies since your discharge? No   Dietary orders reviewed? Yes  Do you have support at home? Yes   Functional Questionnaire: (I = Independent and D = Dependent) ADLs: i  Bathing/Dressing- i  Meal Prep- i  Eating- i  Maintaining continence- i  Transferring/Ambulation- i  Managing Meds- i  Follow up appointments reviewed:   PCP Hospital f/u appt confirmed? Yes  Scheduled to see Dr.[name] via virtual visit  on 05/05/2019 @ 1030am  [facility] Hospital f/u a/ppt confirmed/? No    Are transportation arrangements needed? No   If their condition worsens, is the pt aware to call PCP or go to the Emergency Dept.? Yes  Was the patient provided with contact information for the PCP's office or ED? Yes  Was to pt encouraged to call back with questions or concerns? Yes

## 2019-05-05 NOTE — Assessment & Plan Note (Signed)
No vomiting now. Due to follow up with GI. He will call if he needs a new referral. Continue small meals throughout the day. Continue medication. Call with any concerns.

## 2019-05-05 NOTE — Assessment & Plan Note (Signed)
Due to gastroparesis. Has resolved with IV medication. Follow up with GI as needed. Call with any concerns. Continue to monitor.

## 2019-05-05 NOTE — Assessment & Plan Note (Signed)
Doing better since getting out of the hospital. About to start using his pump. He is looking forward to this and being under better control. Continue to follow with endocrinology. Call with any concerns. Continue to monitor.

## 2019-05-20 ENCOUNTER — Telehealth: Payer: Self-pay | Admitting: Family Medicine

## 2019-05-20 NOTE — Telephone Encounter (Signed)
    Called pt regarding [employer] Referral for financial assistance. Patient is a 49 y.o. male  and is under the care of Laural Benes, Oralia Rud, DO  LMTCB  [employer]  Care Guide . Embedded Care Coordination [place]  Care Management ??[email or web address]  ??[phone]

## 2019-05-29 ENCOUNTER — Other Ambulatory Visit: Payer: Self-pay

## 2019-05-29 ENCOUNTER — Encounter: Payer: Self-pay | Admitting: Emergency Medicine

## 2019-05-29 ENCOUNTER — Emergency Department
Admission: EM | Admit: 2019-05-29 | Discharge: 2019-05-29 | Disposition: A | Discharge status: Home / Self Care | Payer: No Typology Code available for payment source | Attending: Emergency Medicine | Admitting: Emergency Medicine

## 2019-05-29 DIAGNOSIS — R002 Palpitations: Secondary | ICD-10-CM | POA: Diagnosis present

## 2019-05-29 DIAGNOSIS — E109 Type 1 diabetes mellitus without complications: Secondary | ICD-10-CM | POA: Diagnosis not present

## 2019-05-29 DIAGNOSIS — F1193 Opioid use, unspecified with withdrawal: Secondary | ICD-10-CM

## 2019-05-29 DIAGNOSIS — Z87891 Personal history of nicotine dependence: Secondary | ICD-10-CM | POA: Insufficient documentation

## 2019-05-29 DIAGNOSIS — I2510 Atherosclerotic heart disease of native coronary artery without angina pectoris: Secondary | ICD-10-CM | POA: Diagnosis not present

## 2019-05-29 DIAGNOSIS — I110 Hypertensive heart disease with heart failure: Secondary | ICD-10-CM | POA: Diagnosis not present

## 2019-05-29 DIAGNOSIS — Z7982 Long term (current) use of aspirin: Secondary | ICD-10-CM | POA: Insufficient documentation

## 2019-05-29 DIAGNOSIS — I5022 Chronic systolic (congestive) heart failure: Secondary | ICD-10-CM | POA: Insufficient documentation

## 2019-05-29 DIAGNOSIS — R112 Nausea with vomiting, unspecified: Secondary | ICD-10-CM

## 2019-05-29 DIAGNOSIS — Z79899 Other long term (current) drug therapy: Secondary | ICD-10-CM | POA: Diagnosis not present

## 2019-05-29 DIAGNOSIS — F1123 Opioid dependence with withdrawal: Secondary | ICD-10-CM | POA: Insufficient documentation

## 2019-05-29 DIAGNOSIS — Z95810 Presence of automatic (implantable) cardiac defibrillator: Secondary | ICD-10-CM | POA: Insufficient documentation

## 2019-05-29 LAB — CBC WITH DIFFERENTIAL/PLATELET
Abs Immature Granulocytes: 0.04 10*3/uL (ref 0.00–0.07)
Basophils Absolute: 0.1 10*3/uL (ref 0.0–0.1)
Basophils Relative: 1 %
Eosinophils Absolute: 0.2 10*3/uL (ref 0.0–0.5)
Eosinophils Relative: 2 %
HCT: 41.1 % (ref 39.0–52.0)
Hemoglobin: 13.5 g/dL (ref 13.0–17.0)
Immature Granulocytes: 0 %
Lymphocytes Relative: 30 %
Lymphs Abs: 3.5 10*3/uL (ref 0.7–4.0)
MCH: 27.8 pg (ref 26.0–34.0)
MCHC: 32.8 g/dL (ref 30.0–36.0)
MCV: 84.7 fL (ref 80.0–100.0)
Monocytes Absolute: 0.7 10*3/uL (ref 0.1–1.0)
Monocytes Relative: 6 %
Neutro Abs: 7.3 10*3/uL (ref 1.7–7.7)
Neutrophils Relative %: 61 %
Platelets: 295 10*3/uL (ref 150–400)
RBC: 4.85 MIL/uL (ref 4.22–5.81)
RDW: 13.1 % (ref 11.5–15.5)
WBC: 11.8 10*3/uL — ABNORMAL HIGH (ref 4.0–10.5)
nRBC: 0 % (ref 0.0–0.2)

## 2019-05-29 LAB — COMPREHENSIVE METABOLIC PANEL
ALT: 21 U/L (ref 0–44)
AST: 26 U/L (ref 15–41)
Albumin: 4.4 g/dL (ref 3.5–5.0)
Alkaline Phosphatase: 105 U/L (ref 38–126)
Anion gap: 11 (ref 5–15)
BUN: 27 mg/dL — ABNORMAL HIGH (ref 6–20)
CO2: 24 mmol/L (ref 22–32)
Calcium: 9.8 mg/dL (ref 8.9–10.3)
Chloride: 103 mmol/L (ref 98–111)
Creatinine, Ser: 1.43 mg/dL — ABNORMAL HIGH (ref 0.61–1.24)
GFR calc Af Amer: >60 mL/min (ref >60)
GFR calc non Af Amer: 58 mL/min — ABNORMAL LOW (ref >60)
Glucose, Bld: 119 mg/dL — ABNORMAL HIGH (ref 70–99)
Potassium: 3.9 mmol/L (ref 3.5–5.1)
Sodium: 138 mmol/L (ref 135–145)
Total Bilirubin: 0.8 mg/dL (ref 0.3–1.2)
Total Protein: 8 g/dL (ref 6.5–8.1)

## 2019-05-29 LAB — LIPASE, BLOOD: Lipase: 33 U/L (ref 11–51)

## 2019-05-29 LAB — GLUCOSE, CAPILLARY: Glucose-Capillary: 105 mg/dL — ABNORMAL HIGH (ref 70–99)

## 2019-05-29 MED ORDER — ONDANSETRON HCL 4 MG/2ML IJ SOLN
4.0000 mg | Freq: Once | INTRAMUSCULAR | Status: AC
  Administered 2019-05-29: 4 mg via INTRAVENOUS
  Filled 2019-05-29: qty 2

## 2019-05-29 MED ORDER — METHADONE HCL 10 MG PO TABS
120.0000 mg | ORAL_TABLET | Freq: Once | ORAL | Status: AC
  Administered 2019-05-29: 20:00:00 120 mg via ORAL
  Filled 2019-05-29: qty 12

## 2019-05-29 MED ORDER — ONDANSETRON 4 MG PO TBDP: 4.0000 mg | ORAL_TABLET | Freq: Three times a day (TID) | ORAL | 0 refills | Status: AC | PRN

## 2019-05-29 MED ORDER — LACTATED RINGERS IV BOLUS
1000.0000 mL | Freq: Once | INTRAVENOUS | Status: AC
  Administered 2019-05-29: 1000 mL via INTRAVENOUS

## 2019-05-29 NOTE — ED Notes (Signed)
Patient states he is currently on Methadone at 130mg  each day. Patient states that he ran out of the medication today and was unable to get more until tomorrow at [facility]. Patient states that he took a friends subutex today to help with his needs. Patient is uneasy and uncomfortable in room and unable to sit still. States his legs are tingling and feel as though they are numb, patient is diaphoretic.

## 2019-05-29 NOTE — ED Triage Notes (Signed)
Pt presents to ED via POV with c/o generalized body aches, vomiting, and c/o "pouring sweat". Pt with noted hx of hyperglycemic crisis. Pt also c/o feeling like his heart is racing.   Pt appears ill appearing in triage.

## 2019-05-29 NOTE — ED Provider Notes (Signed)
[facility] Emergency Department Provider Note   ____________________________________________   First MD Initiated Contact with Patient 05/29/19 1843     (approximate)  I have reviewed the triage vital signs and the nursing notes.   HISTORY  Chief Complaint Palpitations and Emesis    HPI Jesse Christian is a 49 y.o. male with past with history of CAD status post CABG, hypertension, CHF, diabetes, and opiate abuse on methadone presents to the ED complaining of palpitations and vomiting.  Patient reports that throughout the day today he has been feeling increasingly fidgety with nausea and multiple episodes of nonbilious and nonbloody vomiting.  He denies any associated pain but states his heart has been racing and he has been unable to sit still.  He feels like he has been very sweaty, does admit to taking his last dose of methadone yesterday and is not able to refill this until tomorrow.  He states he vomited up a dose of methadone earlier this month and therefore has been 1 dose short.  He took a dose of a friend's Subutex earlier today thinking it that would help his symptoms.        Past Medical History:  Diagnosis Date  . Diabetes mellitus without complication (HCC)   . Drug abuse (HCC)   . Hyperlipidemia   . Right arm fracture 2018    Patient Active Problem List   Diagnosis Date Noted  . Gastroparesis 05/05/2019  . Abdominal pain 05/01/2019  . CAD (coronary atherosclerotic disease) 05/01/2019  . Diabetes, type 1.5, uncontrolled, managed as type 1 (HCC) 03/28/2019  . Hyponatremia   . Chronic systolic CHF (congestive heart failure) (HCC)   . Opioid dependence, uncomplicated (HCC) 08/12/2018  . Malnutrition of moderate degree 05/07/2018  . Foot infection 09/23/2017  . Benign prostatic hyperplasia with nocturia 06/26/2016  . Anxiety disorder due to medical condition 06/22/2015  . Insomnia 06/22/2015  . Hep C w/o coma, chronic (HCC) 06/10/2015    . Benign hypertensive renal disease 06/08/2015  . History of drug abuse in remission (HCC) 06/08/2015  . Hyperlipidemia     Past Surgical History:  Procedure Laterality Date  . CARDIAC SURGERY    . CARDIAC SURGERY  05/2018   double bypass  . ICD IMPLANT    . LEFT HEART CATH AND CORONARY ANGIOGRAPHY Right 05/06/2018   Procedure: LEFT HEART CATH AND CORONARY ANGIOGRAPHY;  Surgeon: Laurier Nancy, MD;  Location: [facility] INVASIVE CV LAB;  Service: Cardiovascular;  Laterality: Right;    Prior to Admission medications   Medication Sig Start Date End Date Taking? Authorizing Provider  aspirin 81 MG chewable tablet Chew 81 mg by mouth daily.     [provider]  atorvastatin (LIPITOR) 80 MG tablet Take 80 mg by mouth daily. 07/30/18   [provider]  Cholecalciferol (VITAMIN D-1000 MAX ST) 25 MCG (1000 UT) tablet Take 5,000 mcg by mouth daily.    [provider]  furosemide (LASIX) 40 MG tablet Take 1 tablet (40 mg total) by mouth daily as needed. 03/20/19   Alford Highland, MD  insulin aspart (NOVOLOG) 100 UNIT/ML injection Inject 6-15 Units into the skin 3 (three) times daily before meals.  10/02/18 05/01/19  [provider]  LEVEMIR FLEXTOUCH 100 UNIT/ML Pen Inject 40 Units into the skin daily. 03/20/19   Alford Highland, MD  losartan (COZAAR) 25 MG tablet Take 25 mg by mouth daily. 04/16/19   [provider]  magnesium oxide (MAG-OX) 400 MG tablet  Take 400 mg by mouth daily.    [provider]  methadone (DOLOPHINE) 10 MG tablet Take 130 mg by mouth daily.     [provider]  metoCLOPramide (REGLAN) 10 MG tablet Take 5 mg by mouth every 6 (six) hours as needed for nausea/vomiting. 10/02/18   [provider]  metoprolol succinate (TOPROL-XL) 25 MG 24 hr tablet Take 25 mg by mouth daily. 07/30/18   [provider]  ondansetron (ZOFRAN ODT) 4 MG disintegrating tablet Take 1 tablet (4 mg total) by mouth every 8 (eight)  hours as needed for nausea or vomiting. 05/29/19   Chesley Noon, MD  spironolactone (ALDACTONE) 25 MG tablet Take 12.5 mg by mouth daily. 04/16/19   [provider]    Allergies Lyrica [pregabalin] and Metformin and related  Family History  Problem Relation Age of Onset  . Mental illness Mother   . Heart attack Father   . Heart disease Father   . Mental illness Sister   . Cancer Maternal Aunt        Lung cancer  . Prostate cancer Neg Hx   . Bladder Cancer Neg Hx   . Kidney cancer Neg Hx     Social History Social History   Tobacco Use  . Smoking status: Former Smoker    Packs/day: 0.50    Types: Cigarettes    Quit date: 05/05/2018    Years since quitting: 1.0  . Smokeless tobacco: Never Used  Substance Use Topics  . Alcohol use: No  . Drug use: No    Review of Systems  Constitutional: No fever/chills.  Positive for malaise. Eyes: No visual changes. ENT: No sore throat. Cardiovascular: Denies chest pain.  Positive for palpitations. Respiratory: Denies shortness of breath. Gastrointestinal: No abdominal pain.  Positive for nausea and vomiting.  Positive for diarrhea.  No constipation. Genitourinary: Negative for dysuria. Musculoskeletal: Negative for back pain.  Positive for body aches. Skin: Negative for rash. Neurological: Negative for headaches, focal weakness or numbness.  Positive for restlessness.  ____________________________________________   PHYSICAL EXAM:  VITAL SIGNS: ED Triage Vitals  Enc Vitals Group     BP 05/29/19 1835 (!) 161/91     Pulse Rate 05/29/19 1835 99     Resp 05/29/19 1835 (!) 24     Temp 05/29/19 1835 98.3 F (36.8 C)     Temp Source 05/29/19 1835 Oral     SpO2 05/29/19 1835 100 %     Weight 05/29/19 1836 165 lb (74.8 kg)     Height 05/29/19 1836 6' (1.829 m)     Head Circumference --      Peak Flow --      Pain Score 05/29/19 1835 9     Pain Loc --      Pain Edu? --      Excl. in GC? --     Constitutional: Alert  and oriented.  Restless and constantly fidgeting in the room. Eyes: Conjunctivae are normal. Head: Atraumatic. Nose: No congestion/rhinnorhea. Mouth/Throat: Mucous membranes are moist. Neck: Normal ROM Cardiovascular: Normal rate, regular rhythm. Grossly normal heart sounds. Respiratory: Normal respiratory effort.  No retractions. Lungs CTAB. Gastrointestinal: Soft and nontender. No distention. Genitourinary: deferred Musculoskeletal: No lower extremity tenderness nor edema. Neurologic:  Normal speech and language. No gross focal neurologic deficits are appreciated. Skin:  Skin is warm, dry and intact. No rash noted. Psychiatric: Mood and affect are normal. Speech and behavior are normal.  ____________________________________________   LABS (all labs ordered  are listed, but only abnormal results are displayed)  Labs Reviewed  GLUCOSE, CAPILLARY - Abnormal; Notable for the following components:      Result Value   Glucose-Capillary 105 (*)    All other components within normal limits  CBC WITH DIFFERENTIAL/PLATELET - Abnormal; Notable for the following components:   WBC 11.8 (*)    All other components within normal limits  COMPREHENSIVE METABOLIC PANEL - Abnormal; Notable for the following components:   Glucose, Bld 119 (*)    BUN 27 (*)    Creatinine, Ser 1.43 (*)    GFR calc non Af Amer 58 (*)    All other components within normal limits  LIPASE, BLOOD   ____________________________________________  EKG  ED ECG REPORT I, Blake Divine, the attending physician, personally viewed and interpreted this ECG.   Date: 05/29/2019  EKG Time: 18:38  Rate: 96  Rhythm: normal sinus rhythm  Axis: RAD  Intervals:none  ST&T Change: Nonspecific T wave changes   PROCEDURES  Procedure(s) performed (including Critical Care):  Procedures   ____________________________________________   INITIAL IMPRESSION / ASSESSMENT AND PLAN / ED COURSE       49 year old male with  history of opiate abuse currently on methadone presents to the ED complaining of palpitations, sweating, shakiness, nausea and vomiting after running out of his methadone yesterday.  He took a Suboxone thinking that this would help with his symptoms, but the partial agonist activity of Suboxone is likely contributing to his withdrawal symptoms at this time.  He is not hyperglycemic and there is no evidence of DKA, lab work is reassuring.  We will hydrate with IV fluids, give a dose of Zofran, and give his usual methadone dose.  I have counseled him that his symptoms will likely not improve significantly until the Suboxone washes out of his system.  He is due to pick up his refill of methadone tomorrow and I will prescribe Zofran to help treat any nausea and vomiting until then.  Patient turned over to oncoming provider pending IV fluid hydration and reevaluation.      ____________________________________________   FINAL CLINICAL IMPRESSION(S) / ED DIAGNOSES  Final diagnoses:  Opiate withdrawal (HCC)  Non-intractable vomiting with nausea, unspecified vomiting type     ED Discharge Orders         Ordered    ondansetron (ZOFRAN ODT) 4 MG disintegrating tablet  Every 8 hours PRN     05/29/19 1946           Note:  This document was prepared using Dragon voice recognition software and may include unintentional dictation errors.   Blake Divine, MD 05/29/19 [phone]

## 2019-05-30 NOTE — Telephone Encounter (Signed)
2nd attempt no voicemail box set up knb

## 2019-06-04 ENCOUNTER — Other Ambulatory Visit: Payer: Self-pay

## 2019-06-04 ENCOUNTER — Ambulatory Visit (INDEPENDENT_AMBULATORY_CARE_PROVIDER_SITE_OTHER): Payer: No Typology Code available for payment source | Admitting: Gastroenterology

## 2019-06-04 VITALS — BP 150/96 | HR 92 | Temp 98.4°F | Ht 72.0 in | Wt 150.8 lb

## 2019-06-04 DIAGNOSIS — R112 Nausea with vomiting, unspecified: Secondary | ICD-10-CM

## 2019-06-04 DIAGNOSIS — K3184 Gastroparesis: Secondary | ICD-10-CM

## 2019-06-04 DIAGNOSIS — R109 Unspecified abdominal pain: Secondary | ICD-10-CM

## 2019-06-04 NOTE — Progress Notes (Signed)
Jonathon Bellows MD, MRCP(U.K) [address]  [place]  [place], [place] [address]  Main: [phone]  Fax: [phone]   Primary Care Physician: Valerie Roys, DO  Primary Gastroenterologist:  Dr. Harland German follow-up post discharge.  HPI: Jesse Christian is a 49 y.o. male   Summary of history :  I was consulted to see him in January 2021 when he was admitted with nausea vomiting and abdominal pain.  He had a blood sugar level of 550.In June 2020 was evaluated at [facility] for intractable vomiting and nausea.  History of diabetes mellitus.  Diagnosed with diabetes in his 37s.  Poorly controlled diabetes.  Multiple hospitalizations at [facility] for nausea and vomiting.  Associated with elevated blood sugars.  Had been on methadone for narcotic dependency.  Was commenced on Reglan in November 2020.  At that time to the plan for upper endoscopy and colonoscopy once Covid had resolved.  When I went to see him the nausea vomiting and abdominal pain had resolved.  LFTs were normal.  Right upper quadrant ultrasound demonstrated gallbladder sludge.  My impression was that the abdominal pain nausea vomiting was secondary to gastroparesis due to hyperglycemia.  Interval history 04/30/2019-06/04/2019.  05/29/2019 presented to the ER with emesis felt secondary to methadone withdrawal. 05/01/2019: HbA1c 12.6 Still having issues with nausea and abdominal pain.  He states he has an insulin pump.  Follows with methadone clinic for his narcotic dependency management.  Able to keep down fluids.  Not eating much recently.  Current Outpatient Medications  Medication Sig Dispense Refill  . aspirin 81 MG chewable tablet Chew 81 mg by mouth daily.     Marland Kitchen atorvastatin (LIPITOR) 80 MG tablet Take 80 mg by mouth daily.    . Cholecalciferol (VITAMIN D-1000 MAX ST) 25 MCG (1000 UT) tablet Take 5,000 mcg by mouth daily.    . furosemide (LASIX) 40 MG tablet Take 1 tablet (40 mg total) by mouth daily as needed.     . insulin aspart (NOVOLOG) 100 UNIT/ML injection Inject 6-15 Units into the skin 3 (three) times daily before meals.     Marland Kitchen LEVEMIR FLEXTOUCH 100 UNIT/ML Pen Inject 40 Units into the skin daily. 15 mL 1  . losartan (COZAAR) 25 MG tablet Take 25 mg by mouth daily.    . magnesium oxide (MAG-OX) 400 MG tablet Take 400 mg by mouth daily.    . methadone (DOLOPHINE) 10 MG tablet Take 130 mg by mouth daily.     . metoCLOPramide (REGLAN) 10 MG tablet Take 5 mg by mouth every 6 (six) hours as needed for nausea/vomiting.    . metoprolol succinate (TOPROL-XL) 25 MG 24 hr tablet Take 25 mg by mouth daily.    . ondansetron (ZOFRAN ODT) 4 MG disintegrating tablet Take 1 tablet (4 mg total) by mouth every 8 (eight) hours as needed for nausea or vomiting. 12 tablet 0  . spironolactone (ALDACTONE) 25 MG tablet Take 12.5 mg by mouth daily.     No current facility-administered medications for this visit.    Allergies as of 06/04/2019 - Review Complete 05/29/2019  Allergen Reaction Noted  . Lyrica [pregabalin] Other (See Comments) 01/07/2016  . Metformin and related Nausea And Vomiting 07/23/2018    ROS:  General: Negative for anorexia, weight loss, fever, chills, fatigue, weakness. ENT: Negative for hoarseness, difficulty swallowing , nasal congestion. CV: Negative for chest pain, angina, palpitations, dyspnea on exertion, peripheral edema.  Respiratory: Negative for dyspnea at  rest, dyspnea on exertion, cough, sputum, wheezing.  GI: See history of present illness. GU:  Negative for dysuria, hematuria, urinary incontinence, urinary frequency, nocturnal urination.  Endo: Negative for unusual weight change.    Physical Examination:   There were no vitals taken for this visit.  General: Well-nourished, well-developed in no acute distress.  Eyes: No icterus. Conjunctivae pink. Mouth: Oropharyngeal mucosa moist and pink , no lesions erythema or exudate. Lungs: Clear to auscultation bilaterally.  Non-labored. Heart: Regular rate and rhythm, no murmurs rubs or gallops.  Abdomen: Bowel sounds are normal, nontender, nondistended, no hepatosplenomegaly or masses, no abdominal bruits or hernia , no rebound or guarding.   Extremities: No lower extremity edema. No clubbing or deformities. Neuro: Alert and oriented x 3.  Grossly intact. Skin: Warm and dry, no jaundice.   Psych: Alert and cooperative, normal mood and affect.   Imaging Studies: No results found.  Assessment and Plan:   Jesse Christian is a 49 y.o. y/o male here to follow-up after hospital discharge when he was admitted with nausea vomiting which I attributed to diabetic gastroparesis.  He has never had an upper endoscopy or colonoscopy.  She has not had formal gastric emptying study.  He has been on methadone for narcotic dependency.  Likely his gastroparesis is multifactorial from longstanding diabetes, poorly controlled blood sugars and effect of opioids.  Plan 1.  EGD to rule out gastric outlet obstruction.  Will perform colonoscopy once his issue with gastroparesis is resolved and he can actually perform a bowel prep 2.  Gastroparesis diet 3.  Tight glycemic control ideally sugar should be less than 150 mg/dL.  Usually when the sugars rise above this level it leads to acute gastroparesis. 4.  Gradual withdrawal of all narcotic usage. 5.  Gastric emptying study 6.  Follow-up with [facility] which she is established for better control of diabetes. 7.  Check CBC and CMP  I have discussed alternative options, risks & benefits,  which include, but are not limited to, bleeding, infection, perforation,respiratory complication & drug reaction.  The patient agrees with this plan & written consent will be obtained.    Dr [name]  MD,MRCP [facility]) Follow up in 6 to 8 weeks

## 2019-06-04 NOTE — Patient Instructions (Signed)
Gastroparesis  Gastroparesis is a condition in which food takes longer than normal to empty from the stomach. The condition is usually long-lasting (chronic). It may also be called delayed gastric emptying. There is no cure, but there are treatments and things that you can do at home to help relieve symptoms. Treating the underlying condition that causes gastroparesis can also help relieve symptoms. What are the causes? In many cases, the cause of this condition is not known. Possible causes include:  A hormone (endocrine) disorder, such as hypothyroidism or diabetes.  A nervous system disease, such as Parkinson's disease or multiple sclerosis.  Cancer, infection, or surgery that affects the stomach or vagus nerve. The vagus nerve runs from your chest, through your neck, to the lower part of your brain.  A connective tissue disorder, such as scleroderma.  Certain medicines. What increases the risk? You are more likely to develop this condition if you:  Have certain disorders or diseases, including: ? An endocrine disorder. ? An eating disorder. ? Amyloidosis. ? Scleroderma. ? Parkinson's disease. ? Multiple sclerosis. ? Cancer or infection of the stomach or the vagus nerve.  Have had surgery on the stomach or vagus nerve.  Take certain medicines.  Are male. What are the signs or symptoms? Symptoms of this condition include:  Feeling full after eating very little.  Nausea.  Vomiting.  Heartburn.  Abdominal bloating.  Inconsistent blood sugar (glucose) levels on blood tests.  Lack of appetite.  Weight loss.  Acid from the stomach coming up into the esophagus (gastroesophageal reflux).  Sudden tightening (spasm) of the stomach, which can be painful. Symptoms may come and go. Some people may not notice any symptoms. How is this diagnosed? This condition is diagnosed with tests, such as:  Tests that check how long it takes food to move through the stomach and  intestines. These tests include: ? Upper gastrointestinal (GI) series. For this test, you drink a liquid that shows up well on X-rays, and then X-rays will be taken of your intestines. ? Gastric emptying scintigraphy. For this test, you eat food that contains a small amount of radioactive material, and then scans are taken. ? Wireless capsule GI monitoring system. For this test, you swallow a pill (capsule) that records information about how foods and fluid move through your stomach.  Gastric manometry. For this test, a tube is passed down your throat and into your stomach to measure electrical and muscular activity.  Endoscopy. For this test, a long, thin tube is passed down your throat and into your stomach to check for problems in your stomach lining.  Ultrasound. This test uses sound waves to create images of inside the body. This can help rule out gallbladder disease or pancreatitis as a cause of your symptoms. How is this treated? There is no cure for gastroparesis. Treatment may include:  Treating the underlying cause.  Managing your symptoms by making changes to your diet and exercise habits.  Taking medicines to control nausea and vomiting and to stimulate stomach muscles.  Getting food through a feeding tube in the hospital. This may be done in severe cases.  Having surgery to insert a device into your body that helps improve stomach emptying and control nausea and vomiting (gastric neurostimulator). Follow these instructions at home:  Take over-the-counter and prescription medicines only as told by your health care provider.  Follow instructions from your health care provider about eating or drinking restrictions. Your health care provider may recommend that you: ? Eat   smaller meals more often. ? Eat low-fat foods. ? Eat low-fiber forms of high-fiber foods. For example, eat cooked vegetables instead of raw vegetables. ? Have only liquid foods instead of solid foods. Liquid  foods are easier to digest.  Drink enough fluid to keep your urine pale yellow.  Exercise as often as told by your health care provider.  Keep all follow-up visits as told by your health care provider. This is important. Contact a health care provider if you:  Notice that your symptoms do not improve with treatment.  Have new symptoms. Get help right away if you:  Have severe abdominal pain that does not improve with treatment.  Have nausea that is severe or does not go away.  Cannot drink fluids without vomiting. Summary  Gastroparesis is a chronic condition in which food takes longer than normal to empty from the stomach.  Symptoms include nausea, vomiting, heartburn, abdominal bloating, and loss of appetite.  Eating smaller portions, and low-fat, low-fiber foods may help you manage your symptoms.  Get help right away if you have severe abdominal pain. This information is not intended to replace advice given to you by your health care provider. Make sure you discuss any questions you have with your health care provider. Document Revised: 06/25/2017 Document Reviewed: 01/30/2017 Elsevier Patient Education  2020 Elsevier Inc.  

## 2019-06-05 LAB — COMPREHENSIVE METABOLIC PANEL
ALT: 15 IU/L (ref 0–44)
AST: 20 IU/L (ref 0–40)
Albumin/Globulin Ratio: 1.3 (ref 1.2–2.2)
Albumin: 4 g/dL (ref 4.0–5.0)
Alkaline Phosphatase: 88 IU/L (ref 39–117)
BUN/Creatinine Ratio: 21 — ABNORMAL HIGH (ref 9–20)
BUN: 31 mg/dL — ABNORMAL HIGH (ref 6–24)
Bilirubin Total: 1 mg/dL (ref 0.0–1.2)
CO2: 25 mmol/L (ref 20–29)
Calcium: 9.6 mg/dL (ref 8.7–10.2)
Chloride: 93 mmol/L — ABNORMAL LOW (ref 96–106)
Creatinine, Ser: 1.47 mg/dL — ABNORMAL HIGH (ref 0.76–1.27)
GFR calc Af Amer: 65 mL/min/{1.73_m2} (ref >59)
GFR calc non Af Amer: 56 mL/min/{1.73_m2} — ABNORMAL LOW (ref >59)
Globulin, Total: 3 g/dL (ref 1.5–4.5)
Glucose: 144 mg/dL — ABNORMAL HIGH (ref 65–99)
Potassium: 4.8 mmol/L (ref 3.5–5.2)
Sodium: 134 mmol/L (ref 134–144)
Total Protein: 7 g/dL (ref 6.0–8.5)

## 2019-06-05 LAB — CBC
Hematocrit: 41.9 % (ref 37.5–51.0)
Hemoglobin: 13.9 g/dL (ref 13.0–17.7)
MCH: 28 pg (ref 26.6–33.0)
MCHC: 33.2 g/dL (ref 31.5–35.7)
MCV: 84 fL (ref 79–97)
Platelets: 263 10*3/uL (ref 150–450)
RBC: 4.97 x10E6/uL (ref 4.14–5.80)
RDW: 13 % (ref 11.6–15.4)
WBC: 9.1 10*3/uL (ref 3.4–10.8)

## 2019-06-09 ENCOUNTER — Ambulatory Visit: Payer: Self-pay | Admitting: Licensed Clinical Social Worker

## 2019-06-09 NOTE — Chronic Care Management (AMB) (Signed)
  Care Management   Follow Up Note   06/09/2019 Name: Jesse Christian MRN: [id number] DOB: [birth year]  Referred by: Dorcas Carrow, DO Reason for referral : Care Coordination   Jesse Christian is a 49 y.o. year old male who is a primary care patient of Dorcas Carrow, DO. The care management team was consulted for assistance with care management and care coordination needs.    Review of patient status, including review of consultants reports, relevant laboratory and other test results, and collaboration with appropriate care team members and the patient's provider was performed as part of comprehensive patient evaluation and provision of chronic care management services.    SDOH (Social Determinants of Health) assessments performed: No See Care Plan activities for detailed interventions related to [facility])     Advanced Directives: See Care Plan and Vynca application for related entries.   Goals Addressed    . SW- "I need financial support at this time." (pt-stated)       Current Barriers:  . Financial constraints related to managing health care expenses  . Limited social support . ADL IADL limitations . Social Isolation . Lacks knowledge of community resource: disability enrollment and financial support resources that are nearby  Clinical Social Work Clinical Goal(s):  Marland Kitchen Over the next 120 days, patient will work with SW to address concerns related to gaining education and resource connection on financial assistance and support.   Interventions: . Patient interviewed and appropriate assessments performed . Provided patient with information about crisis support resources and the disability process enrollment . Discussed plans with patient for ongoing care management follow up and provided patient with direct contact information for care management team . Advised patient to contact C3 Guide or LCSW for financial support needs . Assisted patient/caregiver with obtaining information  about health plan benefits . Provided education and assistance to client regarding Advanced Directives. . Provided education to patient/caregiver regarding level of care options. . Patient with multiple specialists (methadone clinic, [facility], [facility], [facility]) with health care costs adding up. Patient is interested in process of applying for disability but is actively working and is unable to afford his rent/bills if he were to leave job in order to qualify for Disability. Patient reports having an upcoming disability enrollment date of 06/24/2019. Patient denies any further social work needs at this time but was Adult nurse of social work follow up and resource education provided.  Patient Self Care Activities:  . Attends all scheduled provider appointments . Lacks social connections  Please see past updates related to this goal by clicking on the "Past Updates" button in the selected goal      The care management team is available to follow up with the patient after provider conversation with the patient regarding recommendation for care management engagement and subsequent re-referral to the care management team.   Dickie La, BSW, MSW, LCSW [facility] Care Management [facility]  [facility] [email or web address] Phone: [phone]

## 2019-06-10 ENCOUNTER — Other Ambulatory Visit
Admission: RE | Admit: 2019-06-10 | Discharge: 2019-06-10 | Disposition: A | Discharge status: Home / Self Care | Payer: 59 | Source: Ambulatory Visit | Attending: Gastroenterology | Admitting: Gastroenterology

## 2019-06-10 ENCOUNTER — Ambulatory Visit: Payer: Self-pay

## 2019-06-10 DIAGNOSIS — Z20822 Contact with and (suspected) exposure to covid-19: Secondary | ICD-10-CM | POA: Diagnosis not present

## 2019-06-10 DIAGNOSIS — Z01812 Encounter for preprocedural laboratory examination: Secondary | ICD-10-CM | POA: Diagnosis not present

## 2019-06-10 LAB — SARS CORONAVIRUS 2 (TAT 6-24 HRS): SARS Coronavirus 2: NEGATIVE

## 2019-06-10 NOTE — Telephone Encounter (Signed)
Pt. Reports he is having a sudden onset in his right eye of "black squiggles/lines" floating in his eye. "I can see out of that eye unless a line goes through, I can't see past that." Normal vision in left eye. Wears glasses for reading. Denies any other symptoms. Instructed to have someone drive him to ED now. Verbalizes understanding.  Reason for Disposition . [1] Blurred vision or visual changes AND [2] present now AND [3] sudden onset or new (e.g., minutes, hours, days)  (Exception: seeing floaters / black specks OR previously diagnosed migraine headaches with same symptoms)  Answer Assessment - Initial Assessment Questions 1. DESCRIPTION: "What is the vision loss like? Describe it for me." (e.g., complete vision loss, blurred vision, double vision, floaters, etc.)     Black swiggles 2. LOCATION: "One or both eyes?" If one, ask: "Which eye?"     Right 3. SEVERITY: "Can you see anything?" If so, ask: "What can you see?" (e.g., fine print)     Can see in that eye 4. ONSET: "When did this begin?" "Did it start suddenly or has this been gradual?"     Sudden 5. PATTERN: "Does this come and go, or has it been constant since it started?"     Constant 6. PAIN: "Is there any pain in your eye(s)?"  (Scale 1-10; or mild, moderate, severe)     No pain 7. CONTACTS-GLASSES: "Do you wear contacts or glasses?"     Glasses 8. CAUSE: "What do you think is causing this visual problem?"     Unsure 9. OTHER SYMPTOMS: "Do you have any other symptoms?" (e.g., confusion, headache, arm or leg weakness, speech problems)     No 10. PREGNANCY: "Is there any chance you are pregnant?" "When was your last menstrual period?"       N/A  Protocols used: VISION LOSS OR CHANGE-A-AH

## 2019-06-10 NOTE — Telephone Encounter (Signed)
3rd attempt pt answered was at work asked for c/b in morning. Will call back on 06/11/19 knb

## 2019-06-11 NOTE — Telephone Encounter (Signed)
   06/11/2019  Name: JOANNE BRANDER   MRN: [id number]   DOB: [birth year]   AGE: 49 y.o.   GENDER: male   PCP Olevia Perches P, DO.   Called pt regarding [employer] Referral for financial assistance. Pt stated that he had an appt with SS for 3/16 to apply for disability. Pt is working full time although he said that he doesn't always get full 40 hours a week.  Does not receive food stamps. Would like updated list of food pantries and meal calendar. Stated that he had about $3k he owed to [facility] for heart surgery, will send [facility] application for both [place] and [place].Closing referral pending any other needs of patient.  Manuela Schwartz  Care Guide . Embedded Care Coordination [facility] Management [email or web address]  [phone]      Manuela Schwartz  Care Guide . Embedded Care Coordination [facility] Management [email or web address]  [phone]

## 2019-06-11 NOTE — Telephone Encounter (Signed)
Email to patient 3/3  From: Manuela Schwartz [facility])  Sent: Wednesday, June 11, 2019 12:04 PM To: [email or web address] Subject: SECURE: [employer], [employer] Assistance, [facility]  Good Afternoon [name], Please see attached list of food banks and the meal calendar through [employer].  I have also included the application for [facility] Assistance as well as the [facility] application. At the bottom of the forms there are contact information if you need help applying.  Please let me know if you need anything further,  Manuela Schwartz  Care Guide . Embedded Care Coordination [facility]  Care Management ??[email or web address]  ??[phone]

## 2019-06-12 ENCOUNTER — Encounter: Payer: Self-pay | Admitting: Gastroenterology

## 2019-06-12 ENCOUNTER — Ambulatory Visit: Payer: No Typology Code available for payment source | Admitting: Registered Nurse

## 2019-06-12 ENCOUNTER — Other Ambulatory Visit: Payer: Self-pay

## 2019-06-12 ENCOUNTER — Ambulatory Visit
Admission: RE | Admit: 2019-06-12 | Discharge: 2019-06-12 | Disposition: A | Discharge status: Home / Self Care | Payer: No Typology Code available for payment source | Attending: Gastroenterology | Admitting: Gastroenterology

## 2019-06-12 ENCOUNTER — Encounter
Admission: RE | Disposition: A | Discharge status: Home / Self Care | Payer: Self-pay | Source: Home / Self Care | Attending: Gastroenterology

## 2019-06-12 DIAGNOSIS — K3184 Gastroparesis: Secondary | ICD-10-CM | POA: Diagnosis not present

## 2019-06-12 DIAGNOSIS — Z87891 Personal history of nicotine dependence: Secondary | ICD-10-CM | POA: Diagnosis not present

## 2019-06-12 DIAGNOSIS — Z888 Allergy status to other drugs, medicaments and biological substances status: Secondary | ICD-10-CM | POA: Insufficient documentation

## 2019-06-12 DIAGNOSIS — E1143 Type 2 diabetes mellitus with diabetic autonomic (poly)neuropathy: Secondary | ICD-10-CM | POA: Diagnosis not present

## 2019-06-12 DIAGNOSIS — E785 Hyperlipidemia, unspecified: Secondary | ICD-10-CM | POA: Diagnosis not present

## 2019-06-12 DIAGNOSIS — Z8249 Family history of ischemic heart disease and other diseases of the circulatory system: Secondary | ICD-10-CM | POA: Insufficient documentation

## 2019-06-12 DIAGNOSIS — F1190 Opioid use, unspecified, uncomplicated: Secondary | ICD-10-CM | POA: Diagnosis not present

## 2019-06-12 DIAGNOSIS — Z95810 Presence of automatic (implantable) cardiac defibrillator: Secondary | ICD-10-CM | POA: Insufficient documentation

## 2019-06-12 DIAGNOSIS — Z79899 Other long term (current) drug therapy: Secondary | ICD-10-CM | POA: Insufficient documentation

## 2019-06-12 DIAGNOSIS — I509 Heart failure, unspecified: Secondary | ICD-10-CM | POA: Insufficient documentation

## 2019-06-12 DIAGNOSIS — Z951 Presence of aortocoronary bypass graft: Secondary | ICD-10-CM | POA: Diagnosis not present

## 2019-06-12 DIAGNOSIS — Z794 Long term (current) use of insulin: Secondary | ICD-10-CM | POA: Insufficient documentation

## 2019-06-12 DIAGNOSIS — I110 Hypertensive heart disease with heart failure: Secondary | ICD-10-CM | POA: Insufficient documentation

## 2019-06-12 DIAGNOSIS — B1920 Unspecified viral hepatitis C without hepatic coma: Secondary | ICD-10-CM | POA: Insufficient documentation

## 2019-06-12 DIAGNOSIS — Z7982 Long term (current) use of aspirin: Secondary | ICD-10-CM | POA: Insufficient documentation

## 2019-06-12 HISTORY — PX: ESOPHAGOGASTRODUODENOSCOPY (EGD) WITH PROPOFOL: SHX5813

## 2019-06-12 HISTORY — DX: Heart failure, unspecified: I50.9

## 2019-06-12 HISTORY — DX: Essential (primary) hypertension: I10

## 2019-06-12 LAB — URINE DRUG SCREEN, QUALITATIVE (ARMC ONLY)
Amphetamines, Ur Screen: NOT DETECTED
Barbiturates, Ur Screen: NOT DETECTED
Benzodiazepine, Ur Scrn: NOT DETECTED
Cannabinoid 50 Ng, Ur ~~LOC~~: NOT DETECTED
Cocaine Metabolite,Ur ~~LOC~~: NOT DETECTED
MDMA (Ecstasy)Ur Screen: NOT DETECTED
Methadone Scn, Ur: POSITIVE — AB
Opiate, Ur Screen: NOT DETECTED
Phencyclidine (PCP) Ur S: NOT DETECTED
Tricyclic, Ur Screen: NOT DETECTED

## 2019-06-12 LAB — GLUCOSE, CAPILLARY: Glucose-Capillary: 162 mg/dL — ABNORMAL HIGH (ref 70–99)

## 2019-06-12 SURGERY — ESOPHAGOGASTRODUODENOSCOPY (EGD) WITH PROPOFOL
Anesthesia: General

## 2019-06-12 MED ORDER — SODIUM CHLORIDE 0.9 % IV SOLN: INTRAVENOUS | Status: DC

## 2019-06-12 MED ORDER — LIDOCAINE HCL (CARDIAC) PF 100 MG/5ML IV SOSY
PREFILLED_SYRINGE | INTRAVENOUS | Status: DC | PRN
  Administered 2019-06-12: 40 mg via INTRAVENOUS
  Administered 2019-06-12: 60 mg via INTRAVENOUS

## 2019-06-12 MED ORDER — PROPOFOL 500 MG/50ML IV EMUL
INTRAVENOUS | Status: AC
  Filled 2019-06-12: qty 50

## 2019-06-12 MED ORDER — PROPOFOL 10 MG/ML IV BOLUS
INTRAVENOUS | Status: DC | PRN
  Administered 2019-06-12: 70 mg via INTRAVENOUS

## 2019-06-12 MED ORDER — PROPOFOL 500 MG/50ML IV EMUL
INTRAVENOUS | Status: DC | PRN
  Administered 2019-06-12: 150 ug/kg/min via INTRAVENOUS

## 2019-06-12 NOTE — H&P (Signed)
[name], MD [address], [address], [place], [place], [address] [address], [address], [place], [place], [address] Phone: [phone]  Fax: [phone]  Primary Care Physician:  Dorcas Carrow, DO   Pre-Procedure History & Physical: HPI:  Jesse Christian is a 49 y.o. male is here for an endoscopy    Past Medical History:  Diagnosis Date  . CHF (congestive heart failure) (HCC)   . Diabetes mellitus without complication (HCC)   . Drug abuse (HCC)   . Hyperlipidemia   . Hypertension   . Right arm fracture 2018    Past Surgical History:  Procedure Laterality Date  . CARDIAC SURGERY    . CARDIAC SURGERY  05/2018   double bypass  . CORONARY ANGIOPLASTY    . CORONARY ARTERY BYPASS GRAFT    . ICD IMPLANT    . LEFT HEART CATH AND CORONARY ANGIOGRAPHY Right 05/06/2018   Procedure: LEFT HEART CATH AND CORONARY ANGIOGRAPHY;  Surgeon: Laurier Nancy, MD;  Location: [facility] INVASIVE CV LAB;  Service: Cardiovascular;  Laterality: Right;    Prior to Admission medications   Medication Sig Start Date End Date Taking? Authorizing Provider  aspirin 81 MG chewable tablet Chew 81 mg by mouth daily.    Yes [provider]  atorvastatin (LIPITOR) 80 MG tablet Take 80 mg by mouth daily. 07/30/18  Yes [provider]  Cholecalciferol (VITAMIN D-1000 MAX ST) 25 MCG (1000 UT) tablet Take 5,000 mcg by mouth daily.   Yes [provider]  furosemide (LASIX) 40 MG tablet Take 1 tablet (40 mg total) by mouth daily as needed. 03/20/19  Yes Wieting, Richard, MD  LEVEMIR FLEXTOUCH 100 UNIT/ML Pen Inject 40 Units into the skin daily. 03/20/19  Yes Wieting, Richard, MD  losartan (COZAAR) 25 MG tablet Take 25 mg by mouth daily. 04/16/19  Yes [provider]  magnesium oxide (MAG-OX) 400 MG tablet Take 400 mg by mouth daily.   Yes [provider]  methadone (DOLOPHINE) 10 MG tablet Take 130 mg by mouth daily.    Yes [provider]  metoCLOPramide  (REGLAN) 10 MG tablet Take 5 mg by mouth every 6 (six) hours as needed for nausea/vomiting. 10/02/18  Yes [provider]  metoprolol succinate (TOPROL-XL) 25 MG 24 hr tablet Take 25 mg by mouth daily. 07/30/18  Yes [provider]  ondansetron (ZOFRAN ODT) 4 MG disintegrating tablet Take 1 tablet (4 mg total) by mouth every 8 (eight) hours as needed for nausea or vomiting. 05/29/19  Yes Chesley Noon, MD  spironolactone (ALDACTONE) 25 MG tablet Take 12.5 mg by mouth daily. 04/16/19  Yes [provider]  insulin aspart (NOVOLOG) 100 UNIT/ML injection Inject 6-15 Units into the skin 3 (three) times daily before meals.  10/02/18 05/01/19  [provider]    Allergies as of 06/04/2019 - Review Complete 06/04/2019  Allergen Reaction Noted  . Lyrica [pregabalin] Other (See Comments) 01/07/2016  . Metformin and related Nausea And Vomiting 07/23/2018    Family History  Problem Relation Age of Onset  . Mental illness Mother   . Heart attack Father   . Heart disease Father   . Mental illness Sister   . Cancer Maternal Aunt        Lung cancer  . Prostate cancer Neg Hx   . Bladder Cancer Neg Hx   . Kidney cancer Neg Hx     Social History   Socioeconomic History  . Marital status:  Single    Spouse name: Not on file  . Number of children: Not on file  . Years of education: Not on file  . Highest education level: Not on file  Occupational History  . Not on file  Tobacco Use  . Smoking status: Former Smoker    Packs/day: 0.50    Types: Cigarettes    Quit date: 05/05/2018    Years since quitting: 1.1  . Smokeless tobacco: Never Used  Substance and Sexual Activity  . Alcohol use: No  . Drug use: No  . Sexual activity: Not on file  Other Topics Concern  . Not on file  Social History Narrative  . Not on file   Social Determinants of Health   Financial Resource Strain:   . Difficulty of Paying Living Expenses: Not on file  Food Insecurity:   .  Worried About Charity fundraiser in the Last Year: Not on file  . Ran Out of Food in the Last Year: Not on file  Transportation Needs:   . Lack of Transportation (Medical): Not on file  . Lack of Transportation (Non-Medical): Not on file  Physical Activity:   . Days of Exercise per Week: Not on file  . Minutes of Exercise per Session: Not on file  Stress:   . Feeling of Stress : Not on file  Social Connections:   . Frequency of Communication with Friends and Family: Not on file  . Frequency of Social Gatherings with Friends and Family: Not on file  . Attends Religious Services: Not on file  . Active Member of Clubs or Organizations: Not on file  . Attends Archivist Meetings: Not on file  . Marital Status: Not on file  Intimate Partner Violence:   . Fear of Current or Ex-Partner: Not on file  . Emotionally Abused: Not on file  . Physically Abused: Not on file  . Sexually Abused: Not on file    Review of Systems: See HPI, otherwise negative ROS  Physical Exam: BP (!) 101/56   Pulse 70   Temp (!) 96.7 F (35.9 C) (Temporal)   Resp 20   SpO2 100%  General:   Alert,  pleasant and cooperative in NAD Head:  Normocephalic and atraumatic. Neck:  Supple; no masses or thyromegaly. Lungs:  Clear throughout to auscultation, normal respiratory effort.    Heart:  +S1, +S2, Regular rate and rhythm, No edema. Abdomen:  Soft, nontender and nondistended. Normal bowel sounds, without guarding, and without rebound.   Neurologic:  Alert and  oriented x4;  grossly normal neurologically.  Impression/Plan: Jesse Christian is here for an endoscopy  to be performed for  evaluation of gastroparesis    Risks, benefits, limitations, and alternatives regarding endoscopy have been reviewed with the patient.  Questions have been answered.  All parties agreeable.   Jonathon Bellows, MD  06/12/2019, 9:31 AM

## 2019-06-12 NOTE — Op Note (Signed)
[facility] Gastroenterology Patient Name: Jesse Christian Procedure Date: 06/12/2019 9:25 AM MRN: [id number] Account #: [id number] Date of Birth: 11/04/1970 Admit Type: Outpatient Age: 49 Room: [facility] ENDO ROOM 2 Gender: Male Note Status: Finalized Procedure:             Upper GI endoscopy Indications:           Follow-up of gastroparesis Providers:             Jonathon Bellows MD, MD Medicines:             Monitored Anesthesia Care Complications:         No immediate complications. Procedure:             Pre-Anesthesia Assessment:                        - Prior to the procedure, a History and Physical was                         performed, and patient medications, allergies and                         sensitivities were reviewed. The patient's tolerance                         of previous anesthesia was reviewed.                        - The risks and benefits of the procedure and the                         sedation options and risks were discussed with the                         patient. All questions were answered and informed                         consent was obtained.                        - ASA Grade Assessment: III - A patient with severe                         systemic disease.                        After obtaining informed consent, the endoscope was                         passed under direct vision. Throughout the procedure,                         the patient's blood pressure, pulse, and oxygen                         saturations were monitored continuously. The Endoscope                         was introduced through the mouth, and advanced to the  third part of duodenum. The upper GI endoscopy was                         accomplished with ease. The patient tolerated the                         procedure well. Findings:      The esophagus was normal.      A medium amount of food (residue) was found in the gastric body.      The  cardia and gastric fundus were normal on retroflexion.      The in the duodenum was normal. Impression:            - Normal esophagus.                        - A medium amount of food (residue) in the stomach.                        - Normal.                        - No specimens collected. Recommendation:        - Discharge patient to home (with escort).                        - Resume previous diet.                        - Continue present medications.                        - Return to my office as previously scheduled. Procedure Code(s):     --- Professional ---                        [phone], Esophagogastroduodenoscopy, flexible,                         transoral; diagnostic, including collection of                         specimen(s) by brushing or washing, when performed                         (separate procedure) Diagnosis Code(s):     --- Professional ---                        K31.84, Gastroparesis CPT copyright 2019 American Medical Association. All rights reserved. The codes documented in this report are preliminary and upon coder review may  be revised to meet current compliance requirements. [name], MD [name] MD, MD 06/12/2019 9:48:55 AM This report has been signed electronically. Number of Addenda: 0 Note Initiated On: 06/12/2019 9:25 AM Estimated Blood Loss:  Estimated blood loss: none.      [facility]

## 2019-06-12 NOTE — Transfer of Care (Signed)
Immediate Anesthesia Transfer of Care Note  Patient: Jesse Christian  Procedure(s) Performed: ESOPHAGOGASTRODUODENOSCOPY (EGD) WITH PROPOFOL (N/A )  Patient Location: PACU, endoscopy unit  Anesthesia Type:General  Level of Consciousness: drowsy  Airway & Oxygen Therapy: Patient Spontanous Breathing  Post-op Assessment: Report given to RN and Post -op Vital signs reviewed and stable  Post vital signs: Reviewed and stable  Last Vitals:  Vitals Value Taken Time  BP 105/76 06/12/19 0952  Temp    Pulse 63 06/12/19 0953  Resp 13 06/12/19 0953  SpO2 96 % 06/12/19 0953  Vitals shown include unvalidated device data.  Last Pain:  Vitals:   06/12/19 0826  TempSrc: Temporal  PainSc: 0-No pain      Patients Stated Pain Goal: 0 (06/12/19 0826)  Complications: No apparent anesthesia complications

## 2019-06-12 NOTE — Anesthesia Preprocedure Evaluation (Signed)
Anesthesia Evaluation  Patient identified by MRN, date of birth, ID band Patient awake    Reviewed: Allergy & Precautions, NPO status , Patient's Chart, lab work & pertinent test results, reviewed documented beta blocker date and time   History of Anesthesia Complications Negative for: history of anesthetic complications  Airway Mallampati: II       Dental  (+) Missing, Poor Dentition, Chipped   Pulmonary neg sleep apnea, neg COPD, Not current smoker, former smoker,           Cardiovascular hypertension, Pt. on medications and Pt. on home beta blockers + CABG and +CHF (no problems since CABG)  (-) Past MI + Cardiac Defibrillator      Neuro/Psych neg Seizures Anxiety    GI/Hepatic neg GERD  ,(+) Hepatitis -, C  Endo/Other  diabetes, Type 2, Insulin Dependent  Renal/GU Renal InsufficiencyRenal disease     Musculoskeletal   Abdominal   Peds  Hematology   Anesthesia Other Findings   Reproductive/Obstetrics                             Anesthesia Physical Anesthesia Plan  ASA: III  Anesthesia Plan: General   Post-op Pain Management:    Induction: Intravenous  PONV Risk Score and Plan: 2  Airway Management Planned: Nasal Cannula  Additional Equipment:   Intra-op Plan:   Post-operative Plan:   Informed Consent: I have reviewed the patients History and Physical, chart, labs and discussed the procedure including the risks, benefits and alternatives for the proposed anesthesia with the patient or authorized representative who has indicated his/her understanding and acceptance.       Plan Discussed with:   Anesthesia Plan Comments:         Anesthesia Quick Evaluation

## 2019-06-12 NOTE — Anesthesia Postprocedure Evaluation (Signed)
Anesthesia Post Note  Patient: Jesse Christian  Procedure(s) Performed: ESOPHAGOGASTRODUODENOSCOPY (EGD) WITH PROPOFOL (N/A )  Patient location during evaluation: Endoscopy Anesthesia Type: General Level of consciousness: awake and alert Pain management: pain level controlled Vital Signs Assessment: post-procedure vital signs reviewed and stable Respiratory status: spontaneous breathing and respiratory function stable Cardiovascular status: stable Anesthetic complications: no     Last Vitals:  Vitals:   06/12/19 0826 06/12/19 0952  BP: (!) 101/56 105/76  Pulse: 70 63  Resp: 20 13  Temp: (!) 35.9 C (!) 36.3 C  SpO2: 100% 97%    Last Pain:  Vitals:   06/12/19 0952  TempSrc:   PainSc: Asleep                 Maebelle Sulton K

## 2019-06-13 ENCOUNTER — Encounter: Payer: Self-pay | Admitting: *Deleted

## 2019-06-13 ENCOUNTER — Encounter
Admission: RE | Admit: 2019-06-13 | Discharge: 2019-06-13 | Disposition: A | Discharge status: Home / Self Care | Payer: 59 | Source: Ambulatory Visit | Attending: Gastroenterology | Admitting: Gastroenterology

## 2019-06-13 DIAGNOSIS — R109 Unspecified abdominal pain: Secondary | ICD-10-CM | POA: Diagnosis not present

## 2019-06-13 DIAGNOSIS — R112 Nausea with vomiting, unspecified: Secondary | ICD-10-CM | POA: Diagnosis not present

## 2019-06-13 MED ORDER — TECHNETIUM TC 99M SULFUR COLLOID
2.0000 | Freq: Once | INTRAVENOUS | Status: AC | PRN
  Administered 2019-06-13: 2.831 via INTRAVENOUS

## 2019-06-15 ENCOUNTER — Encounter: Payer: Self-pay | Admitting: Gastroenterology

## 2019-07-14 ENCOUNTER — Ambulatory Visit: Payer: No Typology Code available for payment source | Attending: Internal Medicine

## 2019-07-14 ENCOUNTER — Other Ambulatory Visit: Payer: Self-pay

## 2019-07-14 DIAGNOSIS — Z23 Encounter for immunization: Secondary | ICD-10-CM

## 2019-07-14 NOTE — Progress Notes (Signed)
   Covid-19 Vaccination Clinic  Name:  Jesse Christian    MRN: [id number] DOB: 26-Feb-1971  07/14/2019  Mr. [name] was observed post Covid-19 immunization for 15 minutes without incident. He was provided with Vaccine Information Sheet and instruction to access the V-Safe system.   Mr. [name] was instructed to call 911 with any severe reactions post vaccine: Marland Kitchen Difficulty breathing  . Swelling of face and throat  . A fast heartbeat  . A bad rash all over body  . Dizziness and weakness   Immunizations Administered    Name Date Dose VIS Date Route   Pfizer COVID-19 Vaccine 07/14/2019 12:00 PM 0.3 mL 03/21/2019 Intramuscular   Manufacturer: [employer], [employer]   Lot: [id number]   NDC: [address]-5

## 2019-07-16 ENCOUNTER — Ambulatory Visit (INDEPENDENT_AMBULATORY_CARE_PROVIDER_SITE_OTHER): Payer: 59 | Admitting: Gastroenterology

## 2019-07-16 ENCOUNTER — Other Ambulatory Visit: Payer: Self-pay

## 2019-07-16 VITALS — BP 116/78 | HR 78 | Temp 97.8°F | Ht 72.0 in | Wt 157.8 lb

## 2019-07-16 DIAGNOSIS — K3184 Gastroparesis: Secondary | ICD-10-CM

## 2019-07-16 NOTE — Progress Notes (Signed)
[name] MD, MRCP(U.K) [address]  [address]  [place], [place] [address]  Main: [phone]  Fax: [phone]   Primary Care Physician: Dorcas Carrow, DO  Primary Gastroenterologist:  Dr. [name]   F/u gastroparesis   HPI: Jesse Christian is a 49 y.o. male   Summary of history :  I was consulted to see him in January 2021 when he was admitted with nausea vomiting and abdominal pain.  He had a blood sugar level of 550.In June 2020 was evaluated at [facility] for intractable vomiting and nausea.  History of diabetes mellitus.  Diagnosed with diabetes in his 91s.  Poorly controlled diabetes.  Multiple hospitalizations at [facility] for nausea and vomiting.  Associated with elevated blood sugars.  Had been on methadone for narcotic dependency.  Was commenced on Reglan in November 2020.  At that time to the plan for upper endoscopy and colonoscopy once Covid had resolved.  When I went to see him the nausea vomiting and abdominal pain had resolved.  LFTs were normal.  Right upper quadrant ultrasound demonstrated gallbladder sludge.  My impression was that the abdominal pain nausea vomiting was secondary to gastroparesis due to hyperglycemia.  05/29/2019 presented to the ER with emesis felt secondary to methadone withdrawal. 05/01/2019: HbA1c 12.6   Interval history 06/04/2019-07/16/2019  06/12/2019: EGD: Medium amount of food seen in the antrum of the stomach. 06/04/2019: Hemoglobin 13.9 g, creatinine 1.47 06/13/2019: Gastric emptying study normal  He is cutting down on the methadone, he has an insulin pump and has better control of his glucose.  He still continues have nausea vomiting.  Not feeling significantly better.  He is already on Reglan.  Current Outpatient Medications  Medication Sig Dispense Refill  . aspirin 81 MG chewable tablet Chew 81 mg by mouth daily.     Marland Kitchen atorvastatin (LIPITOR) 80 MG tablet Take 80 mg by mouth daily.    . Cholecalciferol (VITAMIN D-1000 MAX ST) 25  MCG (1000 UT) tablet Take 5,000 mcg by mouth daily.    . furosemide (LASIX) 40 MG tablet Take 1 tablet (40 mg total) by mouth daily as needed.    . insulin aspart (NOVOLOG) 100 UNIT/ML injection Inject 6-15 Units into the skin 3 (three) times daily before meals.     Marland Kitchen LEVEMIR FLEXTOUCH 100 UNIT/ML Pen Inject 40 Units into the skin daily. 15 mL 1  . losartan (COZAAR) 25 MG tablet Take 25 mg by mouth daily.    . magnesium oxide (MAG-OX) 400 MG tablet Take 400 mg by mouth daily.    . methadone (DOLOPHINE) 10 MG tablet Take 130 mg by mouth daily.     . metoCLOPramide (REGLAN) 10 MG tablet Take 5 mg by mouth every 6 (six) hours as needed for nausea/vomiting.    . metoprolol succinate (TOPROL-XL) 25 MG 24 hr tablet Take 25 mg by mouth daily.    . ondansetron (ZOFRAN ODT) 4 MG disintegrating tablet Take 1 tablet (4 mg total) by mouth every 8 (eight) hours as needed for nausea or vomiting. 12 tablet 0  . spironolactone (ALDACTONE) 25 MG tablet Take 12.5 mg by mouth daily.     No current facility-administered medications for this visit.    Allergies as of 07/16/2019 - Review Complete 06/12/2019  Allergen Reaction Noted  . Lyrica [pregabalin] Other (See Comments) 01/07/2016  . Metformin and related Nausea And Vomiting 07/23/2018    ROS:  General: Negative for anorexia, weight loss, fever, chills, fatigue, weakness. ENT:  Negative for hoarseness, difficulty swallowing , nasal congestion. CV: Negative for chest pain, angina, palpitations, dyspnea on exertion, peripheral edema.  Respiratory: Negative for dyspnea at rest, dyspnea on exertion, cough, sputum, wheezing.  GI: See history of present illness. GU:  Negative for dysuria, hematuria, urinary incontinence, urinary frequency, nocturnal urination.  Endo: Negative for unusual weight change.    Physical Examination:   There were no vitals taken for this visit.  General: Well-nourished, well-developed in no acute distress.  Eyes: No icterus.  Conjunctivae pink. Abdomen: Bowel sounds are normal, nontender, nondistended, no hepatosplenomegaly or masses, no abdominal bruits or hernia , no rebound or guarding.   Extremities: No lower extremity edema. No clubbing or deformities. Neuro: Alert and oriented x 3.  Grossly intact. Psych: Alert and cooperative, normal mood and affect.   Imaging Studies: No results found.  Assessment and Plan:   Jesse Christian is a 49 y.o. y/o male here to follow-up after hospital discharge when he was admitted with nausea vomiting which I attributed to diabetic gastroparesis.  Likely his gastroparesis is multifactorial from longstanding diabetes, poorly controlled blood sugars and effect of opioids.  Recent upper endoscopy showed food in the stomach despite fasting for over 12 hours.  Surprisingly his gastric emptying study is normal.  This does not rule out gastroparesis as it is only a snapshot of his gastric emptying function at one point of time.  Plan 1.   Gastroparesis diet 2.  Tight glycemic control ideally sugar should be less than 150 mg/dL.  Usually when the sugars rise above this level it leads to acute gastroparesis. 3.  Gradual withdrawal of all narcotic usage. 4.     I will refer him to [facility] to discuss about a gastric pacemaker as well if discussed surgical options including pyloric myotomy if he would be a candidate for the same.  Medical management he is already on everything that we can possibly do including trying to cut down his blood sugars, get off methadone and a diet.  Dr Jonathon Bellows  MD,MRCP [facility]) Follow up in 4 to 6 months

## 2019-07-25 ENCOUNTER — Ambulatory Visit
Admission: RE | Admit: 2019-07-25 | Discharge: 2019-07-25 | Disposition: A | Discharge status: Home / Self Care | Payer: 59 | Attending: Family Medicine | Admitting: Family Medicine

## 2019-07-25 ENCOUNTER — Ambulatory Visit (INDEPENDENT_AMBULATORY_CARE_PROVIDER_SITE_OTHER): Payer: 59 | Admitting: Family Medicine

## 2019-07-25 ENCOUNTER — Ambulatory Visit: Payer: Self-pay | Admitting: *Deleted

## 2019-07-25 ENCOUNTER — Encounter: Payer: Self-pay | Admitting: Family Medicine

## 2019-07-25 ENCOUNTER — Ambulatory Visit
Admission: RE | Admit: 2019-07-25 | Discharge: 2019-07-25 | Disposition: A | Discharge status: Home / Self Care | Payer: 59 | Source: Ambulatory Visit | Attending: Family Medicine | Admitting: Family Medicine

## 2019-07-25 ENCOUNTER — Other Ambulatory Visit: Payer: Self-pay

## 2019-07-25 VITALS — BP 149/95 | HR 224 | Temp 98.8°F | Wt 157.6 lb

## 2019-07-25 DIAGNOSIS — R079 Chest pain, unspecified: Secondary | ICD-10-CM | POA: Insufficient documentation

## 2019-07-25 DIAGNOSIS — R0781 Pleurodynia: Secondary | ICD-10-CM | POA: Diagnosis not present

## 2019-07-25 DIAGNOSIS — E1365 Other specified diabetes mellitus with hyperglycemia: Secondary | ICD-10-CM | POA: Diagnosis not present

## 2019-07-25 DIAGNOSIS — B182 Chronic viral hepatitis C: Secondary | ICD-10-CM

## 2019-07-25 DIAGNOSIS — I129 Hypertensive chronic kidney disease with stage 1 through stage 4 chronic kidney disease, or unspecified chronic kidney disease: Secondary | ICD-10-CM

## 2019-07-25 DIAGNOSIS — Z125 Encounter for screening for malignant neoplasm of prostate: Secondary | ICD-10-CM

## 2019-07-25 DIAGNOSIS — E782 Mixed hyperlipidemia: Secondary | ICD-10-CM | POA: Diagnosis not present

## 2019-07-25 DIAGNOSIS — IMO0002 Reserved for concepts with insufficient information to code with codable children: Secondary | ICD-10-CM

## 2019-07-25 LAB — MICROALBUMIN, URINE WAIVED
Creatinine, Urine Waived: 100 mg/dL (ref 10–300)
Microalb, Ur Waived: 50 mg/L — ABNORMAL HIGH (ref 0–19)
Microalb/Creat Ratio: >300 mg/g — ABNORMAL HIGH (ref <30)

## 2019-07-25 LAB — UA/M W/RFLX CULTURE, ROUTINE
Bilirubin, UA: NEGATIVE
Ketones, UA: NEGATIVE
Leukocytes,UA: NEGATIVE
Nitrite, UA: NEGATIVE
Specific Gravity, UA: 1.02 (ref 1.005–1.030)
Urobilinogen, Ur: 0.2 mg/dL (ref 0.2–1.0)
pH, UA: 5 (ref 5.0–7.5)

## 2019-07-25 LAB — MICROSCOPIC EXAMINATION
Bacteria, UA: NONE SEEN
WBC, UA: NONE SEEN /hpf (ref 0–5)

## 2019-07-25 LAB — BAYER DCA HB A1C WAIVED: HB A1C (BAYER DCA - WAIVED): 9.7 % — ABNORMAL HIGH (ref <7.0)

## 2019-07-25 MED ORDER — LIDOCAINE 4 % EX PTCH: 1.0000 | MEDICATED_PATCH | Freq: Two times a day (BID) | CUTANEOUS | 3 refills | Status: AC

## 2019-07-25 MED ORDER — BACLOFEN 20 MG PO TABS: 20.0000 mg | ORAL_TABLET | Freq: Three times a day (TID) | ORAL | 0 refills | Status: AC

## 2019-07-25 NOTE — Telephone Encounter (Signed)
Pt called in c/o chest pain in his middle to right side of his chest from a fall that happened either yesterday or day before, he could not remember.   He stated he has had broken ribs before and this feels similar.   [place] his little dog.   He tripped and trying to protect his dog he held her out when he fell and hit on the right side of his chest.   It hurts to take a deep breath, cough, hiccup or anything that jars him causes pain in his right side.   He denies being short of breath or having trouble breathing it just hurts.  No lung history other than he quit smoking recently.    See triage notes.   Protocol is to be seen within 4 hours however Dr. Henriette Combs earliest opening was 3:30 today.   I scheduled him for the 3:30 slot for today with Dr. Laural Benes with the understanding he would go to the ED if he developed shortness of breath, worsening of the pain, the pain changes in nature before his appt this afternoon.   He was agreeable to this plan and verbalized understanding of the instructions.     I called Dr. Henriette Combs office and checked to see if they prefer he go to an urgent care or ED since x-ray services are not offered at the office.   They are checking with Dr. Laural Benes and will call him back.  I called the pt back and let him know someone from the office will be calling him back after checking with Dr. Laural Benes.  He mentioned he could not afford to go to an urgent care.   "I could go to the ED since they can send me a bill".   I instructed him to tell whoever calls him back this information.   He was agreeable to doing that.    He will await further instructions.   I let him know he could use Tylenol or ibuprofen for the pain.   He has not taken anything for pain up to this point.  Reason for Disposition . [1] MILD difficulty breathing (e.g., minimal/no SOB at rest, SOB with walking, pulse <100) AND [2] NEW-onset or WORSE than normal    Larey Seat  now is hurting in chest  Answer Assessment - Initial Assessment Questions 1. RESPIRATORY STATUS: "Describe your breathing?" (e.g., wheezing, shortness of breath, unable to speak, severe coughing)      I fell yesterday or day before walking [place] the river.   I picked up my dog and I tripped and I held my dog out so I would not fall on her.   My rib is very sore.  It's center and to the right of my chest into my pecteral muscle.   When I cough I feel it into my back.    I can't hardly take a deep breath.   I've had heart surgery in the past.    2. ONSET: "When did this breathing problem begin?"      Yesterday or day before.   It's hard to do anything with my right hand because it hurts into my chest.    My dog is fine.   3. PATTERN "Does the difficult breathing come and go, or has it been constant since it started?"      It's constant.   Talking, breathing,  riding in the car anything hurts.  The pain is a 15 when I cough.    I'm having trouble sleeping due to the pain especially when I move. 4. SEVERITY: "How bad is your breathing?" (e.g., mild, moderate, severe)    - MILD: No SOB at rest, mild SOB with walking, speaks normally in sentences, can lay down, no retractions, pulse < 100.    - MODERATE: SOB at rest, SOB with minimal exertion and prefers to sit, cannot lie down flat, speaks in phrases, mild retractions, audible wheezing, pulse 100-120.    - SEVERE: Very SOB at rest, speaks in single words, struggling to breathe, sitting hunched forward, retractions, pulse > 120      I'm ok with my breathing but it just hurts to take a deep breath. 5. RECURRENT SYMPTOM: "Have you had difficulty breathing before?" If so, ask: "When was the last time?" and "What happened that time?"      I recently quit smoking. 6. CARDIAC HISTORY: "Do you have any history of heart disease?" (e.g., heart attack, angina, bypass surgery, angioplasty)      Had CABG a year ago 7. LUNG HISTORY: "Do you have any history of  lung disease?"  (e.g., pulmonary embolus, asthma, emphysema)     No 8. CAUSE: "What do you think is causing the breathing problem?"      I fell and I think I broke my rib, I think. 9. OTHER SYMPTOMS: "Do you have any other symptoms? (e.g., dizziness, runny nose, cough, chest pain, fever)     Just a lot of pain.     I need a note for my job.   I'm in too much pain to do my job. 10. PREGNANCY: "Is there any chance you are pregnant?" "When was your last menstrual period?"       N/A 11. TRAVEL: "Have you traveled out of the country in the last month?" (e.g., travel history, exposures)       No travels.   I've had my first COVID-19 vaccine.   No exposures I'm aware of.  Protocols used: BREATHING DIFFICULTY-A-AH

## 2019-07-25 NOTE — Addendum Note (Signed)
Addended by: Dorcas Carrow on: 07/25/2019 09:31 AM   Modules accepted: Orders

## 2019-07-25 NOTE — Progress Notes (Signed)
BP (!) 149/95 (BP Location: Left Arm, Patient Position: Sitting, Cuff Size: Normal)   Pulse (!) 224   Temp 98.8 F (37.1 C) (Oral)   Wt 157 lb 9.6 oz (71.5 kg)   SpO2 91%   BMI 21.37 kg/m    Subjective:    Patient ID: Jesse Christian, male    DOB: [birth year], 49 y.o.   MRN: [id number]  HPI: Jesse Christian is a 49 y.o. male  Chief Complaint  Patient presents with  . Chest Pain   Jesse Christian was walking his dog yesterday and tripped and fell, he tried to protect his dog and fell on his R chest and is afraid he broke his ribs. He is having pain. No SOB, no significant bruising. He is otherwise doing OK.   HYPERTENSION / HYPERLIPIDEMIA Satisfied with current treatment? yes Duration of hypertension: chronic BP monitoring frequency: not checking BP medication side effects: no Duration of hyperlipidemia: chronic Cholesterol medication side effects: no Cholesterol supplements: none Past cholesterol medications: atorvastatin Medication compliance: excellent compliance Aspirin: yes Recent stressors: yes Recurrent headaches: no Visual changes: no Palpitations: no Dyspnea: no Chest pain: no Lower extremity edema: no Dizzy/lightheaded: no  DIABETES Hypoglycemic episodes:yes Polydipsia/polyuria: yes Visual disturbance: yes Chest pain: yes Paresthesias: no Glucose Monitoring: yes  Accucheck frequency: on pump Taking Insulin?: yes Blood Pressure Monitoring: not checking Retinal Examination: Up to Date Foot Exam: Up to Date Diabetic Education: Completed Pneumovax: Up to Date Influenza: Up to Date Aspirin: yes  Relevant past medical, surgical, family and social history reviewed and updated as indicated. Interim medical history since our last visit reviewed. Allergies and medications reviewed and updated.  Review of Systems  Constitutional: Negative.   Respiratory: Negative.   Cardiovascular: Negative.   Gastrointestinal: Negative.   Musculoskeletal: Positive for  myalgias. Negative for arthralgias, back pain, gait problem, joint swelling, neck pain and neck stiffness.  Skin: Negative.   Psychiatric/Behavioral: Negative.     Per HPI unless specifically indicated above     Objective:    BP (!) 149/95 (BP Location: Left Arm, Patient Position: Sitting, Cuff Size: Normal)   Pulse (!) 224   Temp 98.8 F (37.1 C) (Oral)   Wt 157 lb 9.6 oz (71.5 kg)   SpO2 91%   BMI 21.37 kg/m   Wt Readings from Last 3 Encounters:  07/25/19 157 lb 9.6 oz (71.5 kg)  07/16/19 157 lb 12.8 oz (71.6 kg)  06/04/19 150 lb 12.8 oz (68.4 kg)    Physical Exam Vitals and nursing note reviewed.  Constitutional:      General: He is not in acute distress.    Appearance: Normal appearance. He is not ill-appearing, toxic-appearing or diaphoretic.  HENT:     Head: Normocephalic and atraumatic.     Right Ear: External ear normal.     Left Ear: External ear normal.     Nose: Nose normal.     Mouth/Throat:     Mouth: Mucous membranes are moist.     Pharynx: Oropharynx is clear.  Eyes:     General: No scleral icterus.       Right eye: No discharge.        Left eye: No discharge.     Extraocular Movements: Extraocular movements intact.     Conjunctiva/sclera: Conjunctivae normal.     Pupils: Pupils are equal, round, and reactive to light.  Cardiovascular:     Rate and Rhythm: Normal rate and regular rhythm.     Pulses: Normal pulses.  Heart sounds: Normal heart sounds. No murmur. No friction rub. No gallop.   Pulmonary:     Effort: Pulmonary effort is normal. No respiratory distress.     Breath sounds: Normal breath sounds. No stridor. No wheezing, rhonchi or rales.  Chest:     Chest wall: Tenderness (Ribs 2-4 on the R, no point tenderness) present.  Musculoskeletal:        General: Normal range of motion.     Cervical back: Normal range of motion and neck supple.  Skin:    General: Skin is warm and dry.     Capillary Refill: Capillary refill takes less than 2  seconds.     Coloration: Skin is not jaundiced or pale.     Findings: No bruising, erythema, lesion or rash.  Neurological:     General: No focal deficit present.     Mental Status: He is alert and oriented to person, place, and time. Mental status is at baseline.  Psychiatric:        Mood and Affect: Mood normal.        Behavior: Behavior normal.        Thought Content: Thought content normal.        Judgment: Judgment normal.     Results for orders placed or performed in visit on 07/25/19  Microscopic Examination   BLD  Result Value Ref Range   WBC, UA None seen 0 - 5 /hpf   RBC 3-10 (A) 0 - 2 /hpf   Epithelial Cells (non renal) 0-10 0 - 10 /hpf   Bacteria, UA None seen None seen/Few  Bayer DCA Hb A1c Waived  Result Value Ref Range   HB A1C (BAYER DCA - WAIVED) 9.7 (H) <7.0 %  Comprehensive metabolic panel  Result Value Ref Range   Glucose 167 (H) 65 - 99 mg/dL   BUN 16 6 - 24 mg/dL   Creatinine, Ser 1.70 (H) 0.76 - 1.27 mg/dL   GFR calc non Af Amer 47 (L) >59 mL/min/1.73   GFR calc Af Amer 55 (L) >59 mL/min/1.73   BUN/Creatinine Ratio 9 9 - 20   Sodium 138 134 - 144 mmol/L   Potassium 5.4 (H) 3.5 - 5.2 mmol/L   Chloride 101 96 - 106 mmol/L   CO2 23 20 - 29 mmol/L   Calcium 9.6 8.7 - 10.2 mg/dL   Total Protein 7.0 6.0 - 8.5 g/dL   Albumin 4.3 4.0 - 5.0 g/dL   Globulin, Total 2.7 1.5 - 4.5 g/dL   Albumin/Globulin Ratio 1.6 1.2 - 2.2   Bilirubin Total 0.6 0.0 - 1.2 mg/dL   Alkaline Phosphatase 136 (H) 39 - 117 IU/L   AST 16 0 - 40 IU/L   ALT 10 0 - 44 IU/L  Lipid Panel w/o Chol/HDL Ratio  Result Value Ref Range   Cholesterol, Total 129 100 - 199 mg/dL   Triglycerides 106 0 - 149 mg/dL   HDL 55 >39 mg/dL   VLDL Cholesterol Cal 19 5 - 40 mg/dL   LDL Chol Calc (NIH) 55 0 - 99 mg/dL  Microalbumin, Urine Waived  Result Value Ref Range   Microalb, Ur Waived 50 (H) 0 - 19 mg/L   Creatinine, Urine Waived 100 10 - 300 mg/dL   Microalb/Creat Ratio >300 (H) <30 mg/g    PSA  Result Value Ref Range   Prostate Specific Ag, Serum <0.1 0.0 - 4.0 ng/mL  CBC with Differential/Platelet  Result Value Ref Range   WBC 8.8  3.4 - 10.8 x10E3/uL   RBC 3.99 (L) 4.14 - 5.80 x10E6/uL   Hemoglobin 11.0 (L) 13.0 - 17.7 g/dL   Hematocrit 26.9 (L) 48.5 - 51.0 %   MCV 85 79 - 97 fL   MCH 27.6 26.6 - 33.0 pg   MCHC 32.5 31.5 - 35.7 g/dL   RDW 46.2 70.3 - 50.0 %   Platelets 371 150 - 450 x10E3/uL   Neutrophils 52 Not Estab. %   Lymphs 37 Not Estab. %   Monocytes 8 Not Estab. %   Eos 2 Not Estab. %   Basos 1 Not Estab. %   Neutrophils Absolute 4.5 1.4 - 7.0 x10E3/uL   Lymphocytes Absolute 3.3 (H) 0.7 - 3.1 x10E3/uL   Monocytes Absolute 0.7 0.1 - 0.9 x10E3/uL   EOS (ABSOLUTE) 0.2 0.0 - 0.4 x10E3/uL   Basophils Absolute 0.1 0.0 - 0.2 x10E3/uL   Immature Granulocytes 0 Not Estab. %   Immature Grans (Abs) 0.0 0.0 - 0.1 x10E3/uL  UA/M w/rflx Culture, Routine   Specimen: Blood   BLD  Result Value Ref Range   Specific Gravity, UA 1.020 1.005 - 1.030   pH, UA 5.0 5.0 - 7.5   Color, UA Yellow Yellow   Appearance Ur Clear Clear   Leukocytes,UA Negative Negative   Protein,UA 3+ (A) Negative/Trace   Glucose, UA 3+ (A) Negative   Ketones, UA Negative Negative   RBC, UA 2+ (A) Negative   Bilirubin, UA Negative Negative   Urobilinogen, Ur 0.2 0.2 - 1.0 mg/dL   Nitrite, UA Negative Negative   Microscopic Examination See below:       Assessment & Plan:   Problem List Items Addressed This Visit      Digestive   Hep C w/o coma, chronic (HCC)    S/P treatment. Will check viral load today. Await results.       Relevant Orders   Comprehensive metabolic panel (Completed)   CBC with Differential/Platelet (Completed)     Endocrine   Diabetes, type 1.5, uncontrolled, managed as type 1 (HCC)    Follows with endocrine. Due for labs. Drawn today. Await results.       Relevant Orders   Bayer DCA Hb A1c Waived (Completed)   Comprehensive metabolic panel (Completed)    Microalbumin, Urine Waived (Completed)   CBC with Differential/Platelet (Completed)   UA/M w/rflx Culture, Routine (Completed)     Genitourinary   Benign hypertensive renal disease    Under good control on current regimen. Continue current regimen. Continue to monitor. Call with any concerns. Refills given. Labs drawn today.       Relevant Orders   Comprehensive metabolic panel (Completed)   Microalbumin, Urine Waived (Completed)   CBC with Differential/Platelet (Completed)   TSH   UA/M w/rflx Culture, Routine (Completed)     Other   Hyperlipidemia    Under good control on current regimen. Continue current regimen. Continue to monitor. Call with any concerns. Refills given. Labs drawn today.       Relevant Orders   Comprehensive metabolic panel (Completed)   Lipid Panel w/o Chol/HDL Ratio (Completed)   CBC with Differential/Platelet (Completed)    Other Visit Diagnoses    Rib pain    -  Primary   Await x-ray. Will treat with lidocaine and flexeril. Continue to monitor. Call with any concerns.    Screening for prostate cancer       Labs drawn today. Await results.    Relevant Orders   PSA (Completed)  Follow up plan: Return if symptoms worsen or fail to improve.

## 2019-07-25 NOTE — Telephone Encounter (Signed)
Please have him go get his x-rays this AM and i'll see him this PM Orders in.

## 2019-07-25 NOTE — Telephone Encounter (Signed)
Patient notified and going to get imaging at [facility] now.

## 2019-07-26 LAB — CBC WITH DIFFERENTIAL/PLATELET
Basophils Absolute: 0.1 10*3/uL (ref 0.0–0.2)
Basos: 1 %
EOS (ABSOLUTE): 0.2 10*3/uL (ref 0.0–0.4)
Eos: 2 %
Hematocrit: 33.8 % — ABNORMAL LOW (ref 37.5–51.0)
Hemoglobin: 11 g/dL — ABNORMAL LOW (ref 13.0–17.7)
Immature Grans (Abs): 0 10*3/uL (ref 0.0–0.1)
Immature Granulocytes: 0 %
Lymphocytes Absolute: 3.3 10*3/uL — ABNORMAL HIGH (ref 0.7–3.1)
Lymphs: 37 %
MCH: 27.6 pg (ref 26.6–33.0)
MCHC: 32.5 g/dL (ref 31.5–35.7)
MCV: 85 fL (ref 79–97)
Monocytes Absolute: 0.7 10*3/uL (ref 0.1–0.9)
Monocytes: 8 %
Neutrophils Absolute: 4.5 10*3/uL (ref 1.4–7.0)
Neutrophils: 52 %
Platelets: 371 10*3/uL (ref 150–450)
RBC: 3.99 x10E6/uL — ABNORMAL LOW (ref 4.14–5.80)
RDW: 13.3 % (ref 11.6–15.4)
WBC: 8.8 10*3/uL (ref 3.4–10.8)

## 2019-07-26 LAB — COMPREHENSIVE METABOLIC PANEL
ALT: 10 IU/L (ref 0–44)
AST: 16 IU/L (ref 0–40)
Albumin/Globulin Ratio: 1.6 (ref 1.2–2.2)
Albumin: 4.3 g/dL (ref 4.0–5.0)
Alkaline Phosphatase: 136 IU/L — ABNORMAL HIGH (ref 39–117)
BUN/Creatinine Ratio: 9 (ref 9–20)
BUN: 16 mg/dL (ref 6–24)
Bilirubin Total: 0.6 mg/dL (ref 0.0–1.2)
CO2: 23 mmol/L (ref 20–29)
Calcium: 9.6 mg/dL (ref 8.7–10.2)
Chloride: 101 mmol/L (ref 96–106)
Creatinine, Ser: 1.7 mg/dL — ABNORMAL HIGH (ref 0.76–1.27)
GFR calc Af Amer: 55 mL/min/{1.73_m2} — ABNORMAL LOW (ref >59)
GFR calc non Af Amer: 47 mL/min/{1.73_m2} — ABNORMAL LOW (ref >59)
Globulin, Total: 2.7 g/dL (ref 1.5–4.5)
Glucose: 167 mg/dL — ABNORMAL HIGH (ref 65–99)
Potassium: 5.4 mmol/L — ABNORMAL HIGH (ref 3.5–5.2)
Sodium: 138 mmol/L (ref 134–144)
Total Protein: 7 g/dL (ref 6.0–8.5)

## 2019-07-26 LAB — LIPID PANEL W/O CHOL/HDL RATIO
Cholesterol, Total: 129 mg/dL (ref 100–199)
HDL: 55 mg/dL (ref >39)
LDL Chol Calc (NIH): 55 mg/dL (ref 0–99)
Triglycerides: 106 mg/dL (ref 0–149)
VLDL Cholesterol Cal: 19 mg/dL (ref 5–40)

## 2019-07-26 LAB — PSA: Prostate Specific Ag, Serum: <0.1 ng/mL (ref 0.0–4.0)

## 2019-07-27 ENCOUNTER — Encounter: Payer: Self-pay | Admitting: Family Medicine

## 2019-07-27 NOTE — Assessment & Plan Note (Signed)
Under good control on current regimen. Continue current regimen. Continue to monitor. Call with any concerns. Refills given. Labs drawn today.   

## 2019-07-27 NOTE — Assessment & Plan Note (Signed)
S/P treatment. Will check viral load today. Await results.

## 2019-07-27 NOTE — Assessment & Plan Note (Signed)
Follows with endocrine. Due for labs. Drawn today. Await results.

## 2019-08-05 ENCOUNTER — Ambulatory Visit: Payer: 59 | Attending: Internal Medicine

## 2019-08-05 ENCOUNTER — Encounter: Payer: Self-pay | Admitting: Family Medicine

## 2019-08-05 DIAGNOSIS — Z23 Encounter for immunization: Secondary | ICD-10-CM

## 2019-08-05 NOTE — Progress Notes (Signed)
   Covid-19 Vaccination Clinic  Name:  ALEKSI BRUMMET    MRN: [id number] DOB: 06-07-1970  08/05/2019  Mr. Bazinet was observed post Covid-19 immunization for 15 minutes without incident. He was provided with Vaccine Information Sheet and instruction to access the V-Safe system.   Mr. Dorer was instructed to call 911 with any severe reactions post vaccine: Marland Kitchen Difficulty breathing  . Swelling of face and throat  . A fast heartbeat  . A bad rash all over body  . Dizziness and weakness   Immunizations Administered    Name Date Dose VIS Date Route   Pfizer COVID-19 Vaccine 08/05/2019 10:23 AM 0.3 mL 06/04/2018 Intramuscular   Manufacturer: [employer], [employer]   Lot: [id number]   NDC: [address]-9

## 2019-09-10 ENCOUNTER — Other Ambulatory Visit: Payer: Self-pay | Admitting: Nurse Practitioner

## 2019-10-23 ENCOUNTER — Other Ambulatory Visit: Payer: Self-pay | Admitting: Family Medicine

## 2019-10-23 MED ORDER — INSULIN ASPART 100 UNIT/ML ~~LOC~~ SOLN: 6.0000 [IU] | Freq: Three times a day (TID) | SUBCUTANEOUS | 0 refills | Status: DC

## 2019-10-23 NOTE — Telephone Encounter (Signed)
Requested medication (s) are due for refill today: Yes  Requested medication (s) are on the active medication list: Yes  Last refill:  10/02/18  Future visit scheduled: No  Notes to clinic: Patient moved to [facility], would like refill, unable to refill due to last refilled by another provider and expired Rx     Requested Prescriptions  Pending Prescriptions Disp Refills   insulin aspart (NOVOLOG) 100 UNIT/ML injection 10 mL     Sig: Inject 6-15 Units into the skin 3 (three) times daily before meals.      Endocrinology:  Diabetes - Insulins Failed - 10/23/2019 11:56 AM      Failed - HBA1C is between 0 and 7.9 and within 180 days    Hemoglobin A1C  Date Value Ref Range Status  12/28/2015 >140.0  Final   HB A1C (BAYER DCA - WAIVED)  Date Value Ref Range Status  07/25/2019 9.7 (H) <7.0 % Final    Comment:                                          Diabetic Adult            <7.0                                       Healthy Adult        4.3 - 5.7                                                           (DCCT/NGSP) American Diabetes Association's Summary of Glycemic Recommendations for Adults with Diabetes: Hemoglobin A1c <7.0%. More stringent glycemic goals (A1c <6.0%) may further reduce complications at the cost of increased risk of hypoglycemia.           Passed - Valid encounter within last 6 months    Recent Outpatient Visits           3 months ago Rib pain   [facility] [place], [name], DO   5 months ago Diabetes, type 1.5, uncontrolled, managed as type 1 (HCC)   [facility] [place], [name], DO   6 months ago Close exposure to COVID-19 virus   [facility], [name], DO   9 months ago Other specified diabetes mellitus with hyperglycemia, with long-term current use of insulin (HCC)   [facility], [name], DO   9 months ago Type 2 diabetes mellitus with hyperosmolar nonketotic hyperglycemia (HCC)   [facility] [place], [place], DO

## 2019-10-23 NOTE — Addendum Note (Signed)
Addended by: Wilford Corner on: 10/23/2019 11:56 AM   Modules accepted: Orders

## 2019-10-23 NOTE — Telephone Encounter (Signed)
Patient has since moved to [place] and is requesting a 90 day fill on insulin aspart (NOVOLOG) 100 UNIT/ML injection  Please send prescription to [facility] [address] [place] [address]

## 2020-01-25 IMAGING — DX PORTABLE CHEST - 1 VIEW
1 series · 1 of 1 positions shown · non-contrast
Comparison: Chest radiograph dated 05/05/2018

CLINICAL DATA: 47-year-old male with chest pain and shortness of
breath.

EXAM:
PORTABLE CHEST 1 VIEW

[chest ap]
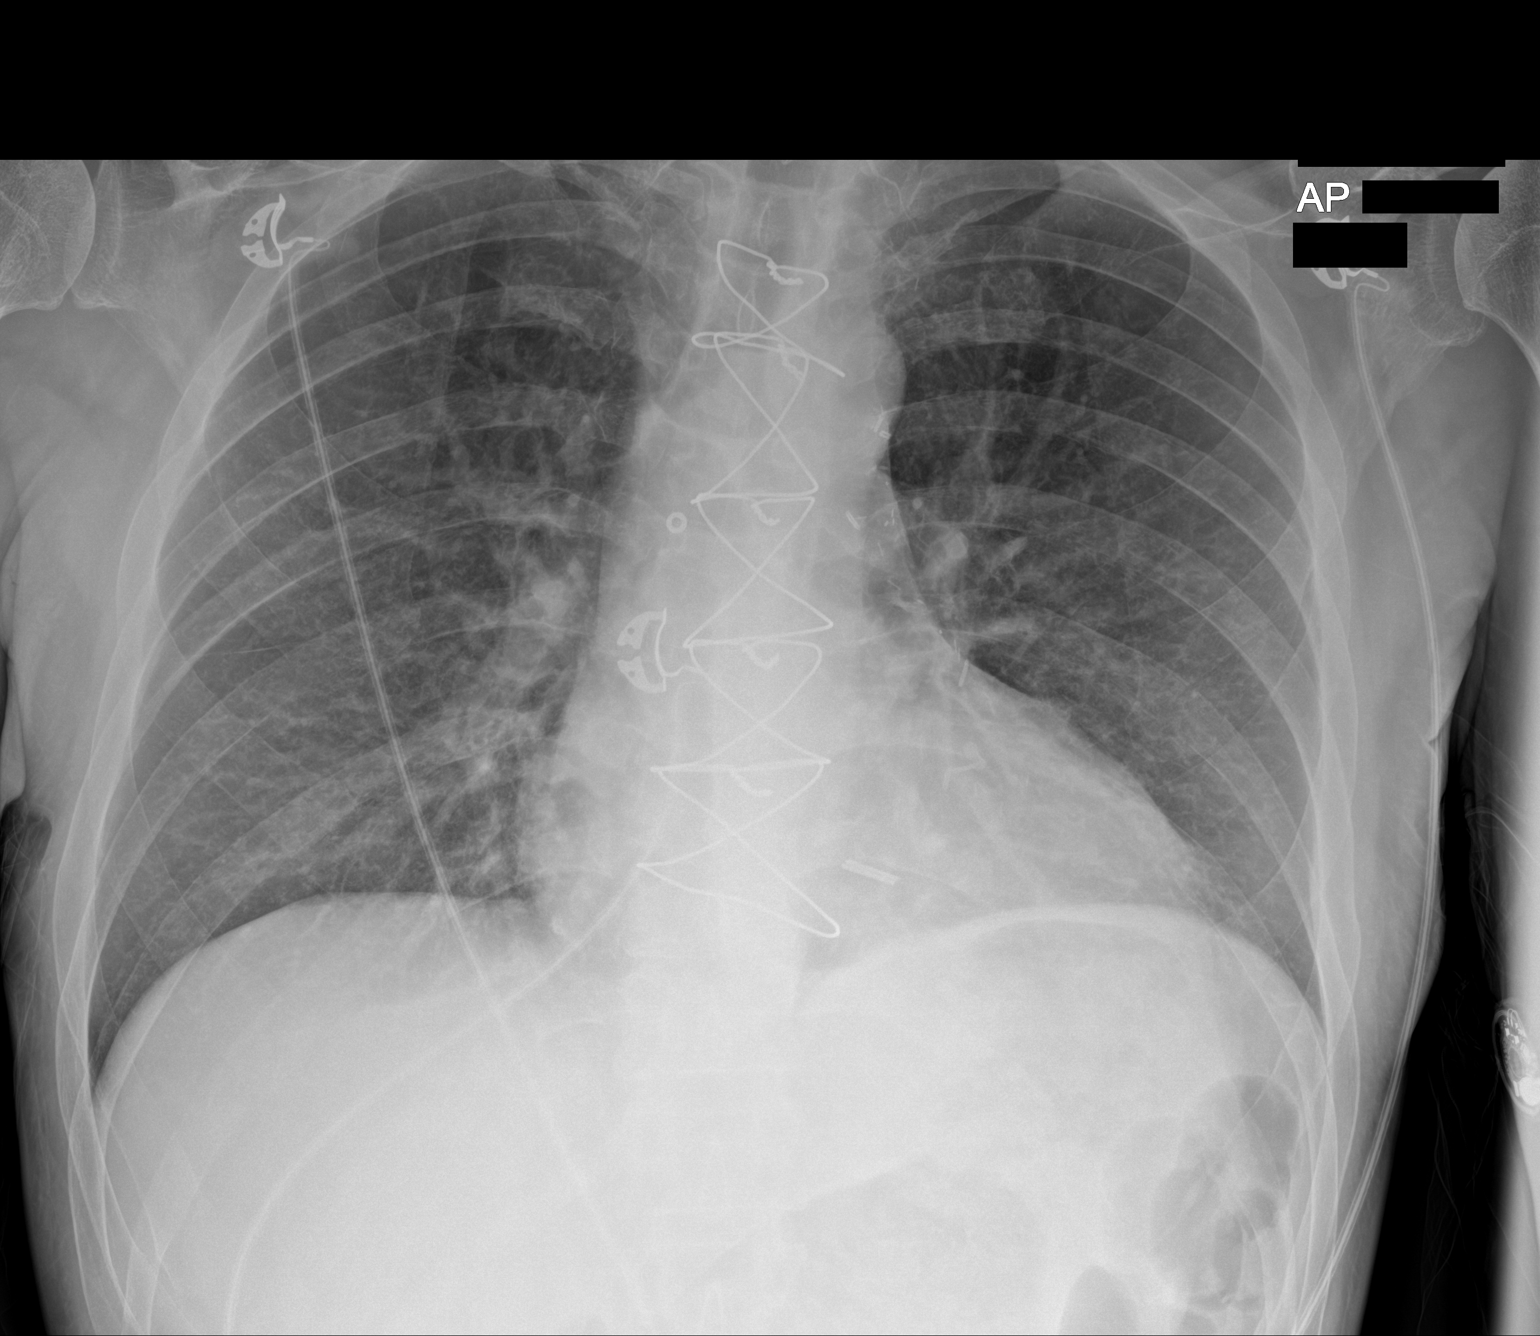

[1 of 1 positions shown; findings below may reference images not displayed]

FINDINGS: There is mild cardiomegaly with vascular congestion. No significant
edema. No pleural effusion or pneumothorax. No focal consolidation.
Median sternotomy wires and CABG vascular clips. No acute osseous
pathology.
IMPRESSION: Cardiomegaly with mild vascular congestion. No focal consolidation.

## 2020-02-22 ENCOUNTER — Other Ambulatory Visit: Payer: Self-pay | Admitting: Family Medicine

## 2020-05-02 ENCOUNTER — Other Ambulatory Visit: Payer: Self-pay | Admitting: Family Medicine

## 2020-10-05 IMAGING — DX DG CHEST 1V PORT
1 series · 1 of 1 positions shown · non-contrast
Comparison: 03/19/2019

CLINICAL DATA: Shortness of breath

EXAM:
PORTABLE CHEST 1 VIEW

[chest ap]
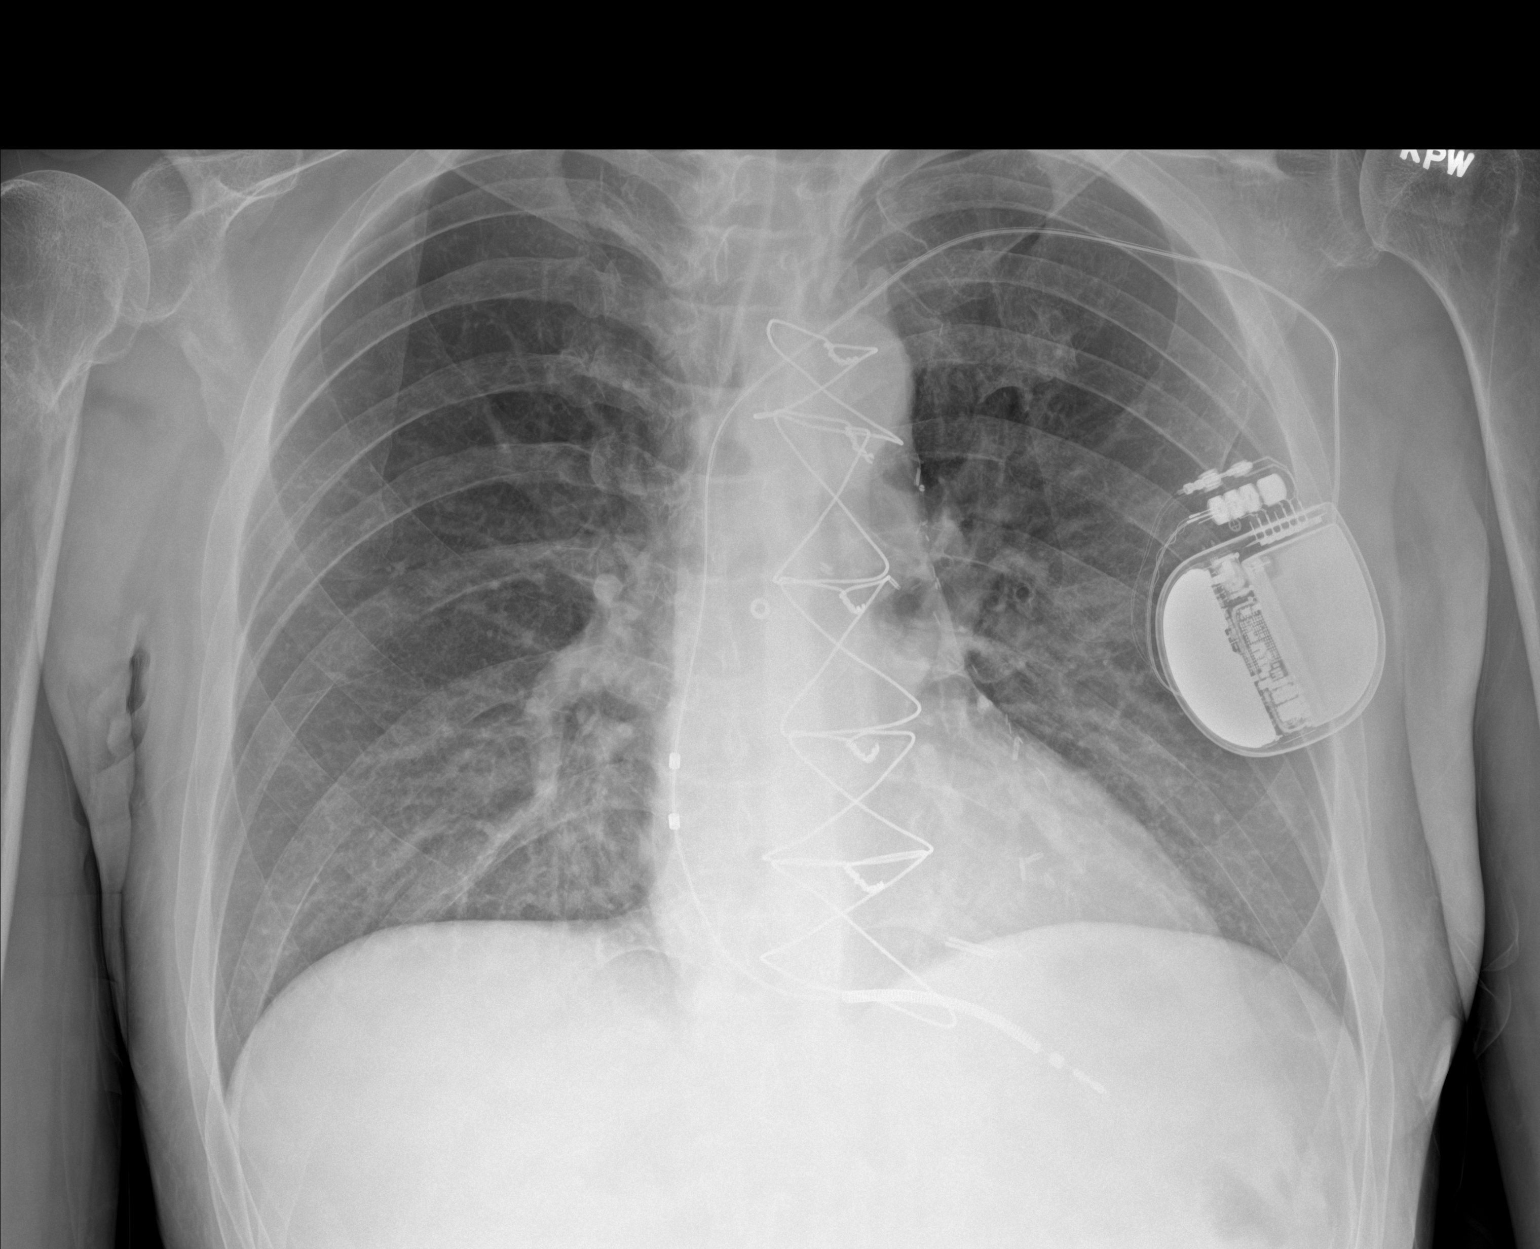

[1 of 1 positions shown; findings below may reference images not displayed]

FINDINGS: Cardiac shadow is stable. Defibrillator is again seen. Postsurgical
changes are again noted. The lungs are well aerated bilaterally. No
focal infiltrate or sizable effusion is seen.
IMPRESSION: No active disease.

## 2022-05-26 ENCOUNTER — Telehealth: Payer: Self-pay | Admitting: Gastroenterology

## 2022-05-26 NOTE — Telephone Encounter (Signed)
all pts medical records were mailed out on 05/26/2022 to [facility]  [place] [address] [place]. [place] [phone]
